# Patient Record
Sex: Male | Born: 1960
Health system: Southern US, Community
[De-identification: ages and names within clinical notes are randomized; demographics above are authoritative.]

## PROBLEM LIST (undated history)

## (undated) DIAGNOSIS — I10 Essential (primary) hypertension: Secondary | ICD-10-CM

## (undated) DIAGNOSIS — N183 Chronic kidney disease, stage 3 unspecified: Secondary | ICD-10-CM

## (undated) DIAGNOSIS — Z8711 Personal history of peptic ulcer disease: Secondary | ICD-10-CM

## (undated) DIAGNOSIS — I2510 Atherosclerotic heart disease of native coronary artery without angina pectoris: Secondary | ICD-10-CM

## (undated) DIAGNOSIS — N289 Disorder of kidney and ureter, unspecified: Secondary | ICD-10-CM

## (undated) DIAGNOSIS — T8859XA Other complications of anesthesia, initial encounter: Secondary | ICD-10-CM

## (undated) DIAGNOSIS — C801 Malignant (primary) neoplasm, unspecified: Secondary | ICD-10-CM

## (undated) DIAGNOSIS — I519 Heart disease, unspecified: Secondary | ICD-10-CM

## (undated) DIAGNOSIS — Z9861 Coronary angioplasty status: Secondary | ICD-10-CM

## (undated) DIAGNOSIS — M25552 Pain in left hip: Secondary | ICD-10-CM

## (undated) DIAGNOSIS — J45909 Unspecified asthma, uncomplicated: Secondary | ICD-10-CM

## (undated) DIAGNOSIS — C61 Malignant neoplasm of prostate: Secondary | ICD-10-CM

## (undated) DIAGNOSIS — M1990 Unspecified osteoarthritis, unspecified site: Secondary | ICD-10-CM

## (undated) DIAGNOSIS — F329 Major depressive disorder, single episode, unspecified: Secondary | ICD-10-CM

## (undated) DIAGNOSIS — M549 Dorsalgia, unspecified: Secondary | ICD-10-CM

## (undated) DIAGNOSIS — F419 Anxiety disorder, unspecified: Secondary | ICD-10-CM

## (undated) DIAGNOSIS — E7800 Pure hypercholesterolemia, unspecified: Secondary | ICD-10-CM

## (undated) DIAGNOSIS — F32A Depression, unspecified: Secondary | ICD-10-CM

## (undated) DIAGNOSIS — G8929 Other chronic pain: Secondary | ICD-10-CM

## (undated) DIAGNOSIS — M7710 Lateral epicondylitis, unspecified elbow: Secondary | ICD-10-CM

## (undated) DIAGNOSIS — R809 Proteinuria, unspecified: Secondary | ICD-10-CM

## (undated) DIAGNOSIS — K219 Gastro-esophageal reflux disease without esophagitis: Secondary | ICD-10-CM

## (undated) DIAGNOSIS — Z8719 Personal history of other diseases of the digestive system: Secondary | ICD-10-CM

## (undated) DIAGNOSIS — M109 Gout, unspecified: Secondary | ICD-10-CM

## (undated) HISTORY — DX: Gastro-esophageal reflux disease without esophagitis: K21.9

## (undated) HISTORY — DX: Chronic kidney disease, stage 3 unspecified: N18.30

## (undated) HISTORY — DX: Depression, unspecified: F32.A

## (undated) HISTORY — DX: Malignant neoplasm of prostate: C61

## (undated) HISTORY — DX: Proteinuria, unspecified: R80.9

## (undated) HISTORY — DX: Unspecified asthma, uncomplicated: J45.909

## (undated) HISTORY — DX: Heart disease, unspecified: I51.9

## (undated) HISTORY — DX: Coronary angioplasty status: Z98.61

## (undated) HISTORY — DX: Anxiety disorder, unspecified: F41.9

## (undated) HISTORY — DX: Essential (primary) hypertension: I10

## (undated) HISTORY — DX: Chronic kidney disease, stage 3 (moderate): N18.3

## (undated) HISTORY — PX: OTHER SURGICAL HISTORY: SHX169

## (undated) HISTORY — DX: Personal history of peptic ulcer disease: Z87.11

## (undated) HISTORY — DX: Unspecified osteoarthritis, unspecified site: M19.90

## (undated) HISTORY — DX: Pure hypercholesterolemia, unspecified: E78.00

## (undated) HISTORY — DX: Disorder of kidney and ureter, unspecified: N28.9

## (undated) HISTORY — DX: Gout, unspecified: M10.9

## (undated) HISTORY — PX: HIP SURGERY: SHX245

## (undated) HISTORY — PX: HERNIA REPAIR: SHX51

---

## 1997-10-25 HISTORY — PX: HERNIA REPAIR: SHX51

## 2002-03-29 ENCOUNTER — Encounter: Payer: Self-pay | Admitting: Internal Medicine

## 2002-03-29 ENCOUNTER — Ambulatory Visit (HOSPITAL_COMMUNITY): Admission: RE | Admit: 2002-03-29 | Discharge: 2002-03-29 | Payer: Self-pay | Admitting: Internal Medicine

## 2002-04-16 ENCOUNTER — Ambulatory Visit (HOSPITAL_COMMUNITY): Admission: RE | Admit: 2002-04-16 | Discharge: 2002-04-16 | Payer: Self-pay | Admitting: Urology

## 2002-04-16 ENCOUNTER — Encounter: Payer: Self-pay | Admitting: Urology

## 2002-05-08 ENCOUNTER — Inpatient Hospital Stay (HOSPITAL_COMMUNITY): Admission: RE | Admit: 2002-05-08 | Discharge: 2002-05-12 | Payer: Self-pay | Admitting: Urology

## 2002-05-08 ENCOUNTER — Encounter (INDEPENDENT_AMBULATORY_CARE_PROVIDER_SITE_OTHER): Payer: Self-pay | Admitting: Specialist

## 2002-07-25 ENCOUNTER — Encounter: Admission: RE | Admit: 2002-07-25 | Discharge: 2002-07-25 | Payer: Self-pay | Admitting: Urology

## 2002-07-25 ENCOUNTER — Encounter: Payer: Self-pay | Admitting: Urology

## 2002-10-25 DIAGNOSIS — I219 Acute myocardial infarction, unspecified: Secondary | ICD-10-CM

## 2002-10-25 DIAGNOSIS — Z9861 Coronary angioplasty status: Secondary | ICD-10-CM

## 2002-10-25 HISTORY — DX: Acute myocardial infarction, unspecified: I21.9

## 2002-10-25 HISTORY — PX: OTHER SURGICAL HISTORY: SHX169

## 2002-10-25 HISTORY — DX: Coronary angioplasty status: Z98.61

## 2003-01-19 ENCOUNTER — Emergency Department (HOSPITAL_COMMUNITY): Admission: EM | Admit: 2003-01-19 | Discharge: 2003-01-20 | Payer: Self-pay | Admitting: *Deleted

## 2003-01-22 ENCOUNTER — Ambulatory Visit (HOSPITAL_COMMUNITY): Admission: RE | Admit: 2003-01-22 | Discharge: 2003-01-22 | Payer: Self-pay | Admitting: *Deleted

## 2003-01-22 ENCOUNTER — Encounter: Payer: Self-pay | Admitting: *Deleted

## 2003-04-24 ENCOUNTER — Ambulatory Visit (HOSPITAL_COMMUNITY): Admission: RE | Admit: 2003-04-24 | Discharge: 2003-04-24 | Payer: Self-pay | Admitting: Internal Medicine

## 2003-04-24 ENCOUNTER — Encounter: Payer: Self-pay | Admitting: Internal Medicine

## 2003-05-01 ENCOUNTER — Ambulatory Visit (HOSPITAL_COMMUNITY): Admission: RE | Admit: 2003-05-01 | Discharge: 2003-05-01 | Payer: Self-pay | Admitting: Internal Medicine

## 2003-05-01 ENCOUNTER — Encounter: Payer: Self-pay | Admitting: Internal Medicine

## 2003-05-22 ENCOUNTER — Encounter: Payer: Self-pay | Admitting: Urology

## 2003-05-22 ENCOUNTER — Encounter (HOSPITAL_COMMUNITY): Admission: RE | Admit: 2003-05-22 | Discharge: 2003-06-21 | Payer: Self-pay | Admitting: Urology

## 2003-05-29 ENCOUNTER — Encounter: Payer: Self-pay | Admitting: Orthopedic Surgery

## 2003-05-29 ENCOUNTER — Ambulatory Visit (HOSPITAL_COMMUNITY): Admission: RE | Admit: 2003-05-29 | Discharge: 2003-05-29 | Payer: Self-pay | Admitting: Orthopedic Surgery

## 2003-08-19 ENCOUNTER — Ambulatory Visit (HOSPITAL_COMMUNITY): Admission: RE | Admit: 2003-08-19 | Discharge: 2003-08-19 | Payer: Self-pay | Admitting: Internal Medicine

## 2003-08-19 ENCOUNTER — Encounter: Payer: Self-pay | Admitting: Internal Medicine

## 2003-08-21 ENCOUNTER — Ambulatory Visit (HOSPITAL_COMMUNITY): Admission: RE | Admit: 2003-08-21 | Discharge: 2003-08-21 | Payer: Self-pay | Admitting: Internal Medicine

## 2003-08-22 ENCOUNTER — Ambulatory Visit (HOSPITAL_COMMUNITY): Admission: RE | Admit: 2003-08-22 | Discharge: 2003-08-22 | Payer: Self-pay | Admitting: Orthopedic Surgery

## 2004-06-08 ENCOUNTER — Ambulatory Visit (HOSPITAL_COMMUNITY): Admission: RE | Admit: 2004-06-08 | Discharge: 2004-06-08 | Payer: Self-pay | Admitting: Internal Medicine

## 2004-09-15 ENCOUNTER — Ambulatory Visit (HOSPITAL_COMMUNITY): Admission: RE | Admit: 2004-09-15 | Discharge: 2004-09-15 | Payer: Self-pay | Admitting: Internal Medicine

## 2004-10-14 ENCOUNTER — Ambulatory Visit: Payer: Self-pay | Admitting: Orthopedic Surgery

## 2004-10-21 ENCOUNTER — Ambulatory Visit (HOSPITAL_COMMUNITY): Admission: RE | Admit: 2004-10-21 | Discharge: 2004-10-21 | Payer: Self-pay | Admitting: Orthopedic Surgery

## 2004-10-28 ENCOUNTER — Ambulatory Visit: Payer: Self-pay | Admitting: Orthopedic Surgery

## 2005-06-07 ENCOUNTER — Ambulatory Visit: Payer: Self-pay | Admitting: Orthopedic Surgery

## 2005-08-12 ENCOUNTER — Ambulatory Visit (HOSPITAL_COMMUNITY): Admission: RE | Admit: 2005-08-12 | Discharge: 2005-08-12 | Payer: Self-pay | Admitting: Internal Medicine

## 2005-10-12 ENCOUNTER — Inpatient Hospital Stay (HOSPITAL_COMMUNITY): Admission: AD | Admit: 2005-10-12 | Discharge: 2005-10-13 | Payer: Self-pay | Admitting: Cardiology

## 2005-10-22 ENCOUNTER — Ambulatory Visit (HOSPITAL_COMMUNITY): Admission: RE | Admit: 2005-10-22 | Discharge: 2005-10-22 | Payer: Self-pay | Admitting: Orthopedic Surgery

## 2005-11-03 ENCOUNTER — Ambulatory Visit: Payer: Self-pay | Admitting: Orthopedic Surgery

## 2006-04-26 ENCOUNTER — Emergency Department (HOSPITAL_COMMUNITY): Admission: EM | Admit: 2006-04-26 | Discharge: 2006-04-26 | Payer: Self-pay | Admitting: Emergency Medicine

## 2006-04-28 ENCOUNTER — Inpatient Hospital Stay (HOSPITAL_COMMUNITY): Admission: AD | Admit: 2006-04-28 | Discharge: 2006-04-30 | Payer: Self-pay | Admitting: Internal Medicine

## 2006-07-11 ENCOUNTER — Ambulatory Visit: Payer: Self-pay | Admitting: Orthopedic Surgery

## 2006-08-01 ENCOUNTER — Emergency Department (HOSPITAL_COMMUNITY): Admission: EM | Admit: 2006-08-01 | Discharge: 2006-08-02 | Payer: Self-pay | Admitting: Emergency Medicine

## 2006-09-12 ENCOUNTER — Ambulatory Visit: Payer: Self-pay | Admitting: Orthopedic Surgery

## 2006-09-14 ENCOUNTER — Ambulatory Visit (HOSPITAL_COMMUNITY): Admission: RE | Admit: 2006-09-14 | Discharge: 2006-09-14 | Payer: Self-pay | Admitting: Orthopedic Surgery

## 2006-09-21 ENCOUNTER — Ambulatory Visit: Payer: Self-pay | Admitting: Orthopedic Surgery

## 2006-09-29 ENCOUNTER — Ambulatory Visit (HOSPITAL_COMMUNITY): Admission: RE | Admit: 2006-09-29 | Discharge: 2006-09-29 | Payer: Self-pay | Admitting: Orthopedic Surgery

## 2007-01-12 ENCOUNTER — Ambulatory Visit: Payer: Self-pay | Admitting: Orthopedic Surgery

## 2007-07-17 ENCOUNTER — Ambulatory Visit: Payer: Self-pay | Admitting: Orthopedic Surgery

## 2007-07-19 ENCOUNTER — Ambulatory Visit (HOSPITAL_COMMUNITY): Admission: RE | Admit: 2007-07-19 | Discharge: 2007-07-19 | Payer: Self-pay | Admitting: Orthopedic Surgery

## 2007-07-25 ENCOUNTER — Ambulatory Visit: Payer: Self-pay | Admitting: Orthopedic Surgery

## 2008-01-15 ENCOUNTER — Ambulatory Visit (HOSPITAL_COMMUNITY): Admission: RE | Admit: 2008-01-15 | Discharge: 2008-01-15 | Payer: Self-pay | Admitting: *Deleted

## 2008-01-16 ENCOUNTER — Inpatient Hospital Stay (HOSPITAL_COMMUNITY): Admission: AD | Admit: 2008-01-16 | Discharge: 2008-01-19 | Payer: Self-pay | Admitting: Cardiovascular Disease

## 2008-01-21 ENCOUNTER — Other Ambulatory Visit: Payer: Self-pay | Admitting: Emergency Medicine

## 2008-01-21 ENCOUNTER — Inpatient Hospital Stay (HOSPITAL_COMMUNITY): Admission: EM | Admit: 2008-01-21 | Discharge: 2008-01-24 | Payer: Self-pay | Admitting: Cardiovascular Disease

## 2008-11-11 ENCOUNTER — Ambulatory Visit: Payer: Self-pay | Admitting: Orthopedic Surgery

## 2008-11-11 DIAGNOSIS — M8700 Idiopathic aseptic necrosis of unspecified bone: Secondary | ICD-10-CM | POA: Insufficient documentation

## 2008-11-11 DIAGNOSIS — M25559 Pain in unspecified hip: Secondary | ICD-10-CM

## 2009-02-24 ENCOUNTER — Emergency Department (HOSPITAL_COMMUNITY): Admission: EM | Admit: 2009-02-24 | Discharge: 2009-02-24 | Payer: Self-pay | Admitting: Emergency Medicine

## 2009-04-18 ENCOUNTER — Encounter: Payer: Self-pay | Admitting: Orthopedic Surgery

## 2009-06-30 ENCOUNTER — Inpatient Hospital Stay (HOSPITAL_COMMUNITY): Admission: EM | Admit: 2009-06-30 | Discharge: 2009-07-02 | Payer: Self-pay | Admitting: Cardiology

## 2010-11-14 ENCOUNTER — Encounter: Payer: Self-pay | Admitting: Orthopedic Surgery

## 2011-01-29 LAB — LIPID PANEL
Cholesterol: 126 mg/dL (ref 0–200)
HDL: 42 mg/dL (ref >39)
LDL Cholesterol: 62 mg/dL (ref 0–99)
Triglycerides: 110 mg/dL (ref <150)

## 2011-01-29 LAB — BASIC METABOLIC PANEL
BUN: 14 mg/dL (ref 6–23)
Calcium: 9.6 mg/dL (ref 8.4–10.5)
Chloride: 102 mEq/L (ref 96–112)
Chloride: 106 mEq/L (ref 96–112)
GFR calc Af Amer: 54 mL/min — ABNORMAL LOW (ref >60)
GFR calc non Af Amer: 44 mL/min — ABNORMAL LOW (ref >60)
GFR calc non Af Amer: 45 mL/min — ABNORMAL LOW (ref >60)
Glucose, Bld: 101 mg/dL — ABNORMAL HIGH (ref 70–99)
Glucose, Bld: 96 mg/dL (ref 70–99)
Potassium: 5 mEq/L (ref 3.5–5.1)
Potassium: 4.2 mEq/L (ref 3.5–5.1)
Sodium: 141 mEq/L (ref 135–145)

## 2011-01-29 LAB — URINE MICROSCOPIC-ADD ON

## 2011-01-29 LAB — D-DIMER, QUANTITATIVE: D-Dimer, Quant: 0.22 ug/mL-FEU (ref 0.00–0.48)

## 2011-01-29 LAB — CBC
HCT: 44.2 % (ref 39.0–52.0)
HCT: 41 % (ref 39.0–52.0)
HCT: 42.4 % (ref 39.0–52.0)
Hemoglobin: 15.5 g/dL (ref 13.0–17.0)
Hemoglobin: 15.1 g/dL (ref 13.0–17.0)
MCV: 91.3 fL (ref 78.0–100.0)
MCV: 93.3 fL (ref 78.0–100.0)
Platelets: 181 10*3/uL (ref 150–400)
Platelets: 179 10*3/uL (ref 150–400)
RBC: 4.39 MIL/uL (ref 4.22–5.81)
RBC: 4.6 MIL/uL (ref 4.22–5.81)
WBC: 5.3 10*3/uL (ref 4.0–10.5)
WBC: 6.3 10*3/uL (ref 4.0–10.5)
WBC: 7 10*3/uL (ref 4.0–10.5)

## 2011-01-29 LAB — DIFFERENTIAL
Eosinophils Absolute: 0.1 10*3/uL (ref 0.0–0.7)
Eosinophils Relative: 1 % (ref 0–5)
Lymphs Abs: 1 10*3/uL (ref 0.7–4.0)
Monocytes Absolute: 0.5 10*3/uL (ref 0.1–1.0)

## 2011-01-29 LAB — HEPATIC FUNCTION PANEL
ALT: 25 U/L (ref 0–53)
AST: 23 U/L (ref 0–37)
Alkaline Phosphatase: 61 U/L (ref 39–117)
Bilirubin, Direct: <0.1 mg/dL (ref 0.0–0.3)
Total Protein: 6.3 g/dL (ref 6.0–8.3)

## 2011-01-29 LAB — POCT CARDIAC MARKERS
CKMB, poc: 1 ng/mL (ref 1.0–8.0)
Myoglobin, poc: 61.2 ng/mL (ref 12–200)
Troponin i, poc: <0.05 ng/mL (ref 0.00–0.09)

## 2011-01-29 LAB — URINALYSIS, ROUTINE W REFLEX MICROSCOPIC
Leukocytes, UA: NEGATIVE
Nitrite: NEGATIVE
Specific Gravity, Urine: 1.009 (ref 1.005–1.030)
Urobilinogen, UA: 0.2 mg/dL (ref 0.0–1.0)
pH: 6.5 (ref 5.0–8.0)

## 2011-01-29 LAB — APTT: aPTT: 32 seconds (ref 24–37)

## 2011-01-29 LAB — HEPARIN LEVEL (UNFRACTIONATED)
Heparin Unfractionated: 0.9 IU/mL — ABNORMAL HIGH (ref 0.30–0.70)
Heparin Unfractionated: 1.27 IU/mL — ABNORMAL HIGH (ref 0.30–0.70)

## 2011-01-29 LAB — TROPONIN I: Troponin I: 0.01 ng/mL (ref 0.00–0.06)

## 2011-01-29 LAB — CARDIAC PANEL(CRET KIN+CKTOT+MB+TROPI)
CK, MB: 1.1 ng/mL (ref 0.3–4.0)
CK, MB: 1.1 ng/mL (ref 0.3–4.0)
Total CK: 65 U/L (ref 7–232)

## 2011-01-29 LAB — TSH: TSH: 2.147 u[IU]/mL (ref 0.350–4.500)

## 2011-01-29 LAB — CK TOTAL AND CKMB (NOT AT ARMC): Relative Index: INVALID (ref 0.0–2.5)

## 2011-01-29 LAB — LIPASE, BLOOD: Lipase: 23 U/L (ref 11–59)

## 2011-01-29 LAB — BRAIN NATRIURETIC PEPTIDE: Pro B Natriuretic peptide (BNP): 60 pg/mL (ref 0.0–100.0)

## 2011-03-09 NOTE — Discharge Summary (Signed)
NAMEMarland Kitchen  Buck, Brandon NO.:  [id number]   MEDICAL RECORD NO.:  [id number]          PATIENT TYPE:  INP   LOCATION:  2035                         FACILITY:  [facility]   PHYSICIAN:  Bryson Dames, M.D.DATE OF BIRTH:  1960-12-27   DATE OF ADMISSION:  01/21/2008  DATE OF DISCHARGE:  01/24/2008                               DISCHARGE SUMMARY   HISTORY OF PRESENT ILLNESS:  [name] is a 50 year old white male  patient of Dr. __________ Mathis Bud who had just been discharged from the  hospital on January 18, 2008 after undergoing Cypher stenting to his  circumflex and distal RCA.  He had a slight enzyme increase after the  catheter at 1.28.  He had called over the weekend complaining of chest  discomfort.  He was ordered Imdur.  The Imdur did not seem to help.  He  did take some sublingual nitroglycerin with some relief, but not  complete.  As he came to the emergency room, he was seen by Bari Mantis  and it was decided to admit him for further evaluation.  His enzymes had  decreased.  His D-dimer was 0.30.  He stated that the angina was similar  to his pre-stent pain, but not as severe.  He stated it was not his  indigestion pain.  He has had a history of a nephrectomy in the past,  but the creatinine ranges from 1.7 to 1.9.  He was seen by the Dr. Claiborne Billings  on the morning of January 22, 2008 and it was noted that he had some EKG  changes with T-wave inversions in his inferior leads.  Thus it was  decided that he should undergo cardiac catheterization.  He was given  fluids on January 22, 2008 and on January 23, 2008 he underwent cardiac  catheterization by Dr. Myrtice Lauth.  He was found to have patent  stent and only 40% circumflex.  His INR that morning would have been  1.78.  It was decided to give him fluids overnight again and Dr. Melvern Banker  attempted a [employer]; however, was unsatisfactory.  So pressure was  held.  On the morning of January 24, 2008, the patient refused to  have  anymore labs done.  He had no more chest pain on that morning.  He did  have chest pain the previous day on January 22, 2008.  He was ready to go  home.  His blood pressure was 130/72, heart rate was 83, respirations  were 20, and temperature 97.2.   DISCHARGE DIAGNOSES:  1. Chest pain, angina versus musculoskeletal versus gastrointestinal.  2. Coronary artery disease, status post Cypher stenting to his      circumflex and right coronary artery.  3. Recurrent chest pain similar to his previous angina with      hospitalization and recath without any further obstructive disease      noted in stents were patent.  4. Chronic renal insufficiency.  5. History of nephrectomy secondary to colon carcinoma.  6. Hypertension.  7. Hyperlipidemia.   DISCHARGE MEDICATIONS:  1. Pepcid 20 mg twice a day.  2.  Plavix 75 mg a day.  3. Aspirin 81 mg a day.  He should take 2.  4. Toprol XL 75 mg a day.  5. Crestor 20 mg at bedtime.  6. Diltiazem ER 180 mg a day.  7. Lyrica 50 mg twice a day.  8. Nitroglycerin sublingual as needed.  9. Imdur 30 mg once a day.  10.He can take simethicone tablet for gas, if he needs them.      Cyndia Bent, N.P.    ______________________________  Bryson Dames, M.D.    BB/MEDQ  D:  01/24/2008  T:  01/24/2008  Job:  [id number]

## 2011-03-09 NOTE — Cardiovascular Report (Signed)
NAMELUNSFORD, BENNER NO.:  [id number]   MEDICAL RECORD NO.:  [id number]          PATIENT TYPE:  INP   LOCATION:  2925                         FACILITY:  [facility]   PHYSICIAN:  Octavia Heir, MD  DATE OF BIRTH:  1961-08-23   DATE OF PROCEDURE:  01/17/2008  DATE OF DISCHARGE:                            CARDIAC CATHETERIZATION   PROCEDURES:  1. Left heart catheterization.  2. Coronary angiography.  3. Left circumflex - mid.      a.     Percutaneous transluminal angioplasty.      b.     Placement of intracoronary stent.  4. RCA - distal.      a.     Percutaneous transluminal  angioplasty.      b.     Placement of coronary stent.   ATTEDING:  Octavia Heir, MD   COMPLICATIONS:  None.   INDICATIONS:  Mr. Catano is a 50 year old male patient of Dr. Rockey Situ, Dr. Kerin Perna and Dr. Lowanda Foster with a history of mild CAD  by cath in December 2006, positive CAD, history of chronic renal  insufficiency (serum creatinine of 1.5 and 1.8) history of renal cell  lung carcinoma with right nephrectomy in 2003 who recently saw Dr.  Rollene Fare with crescendo angina.  This was associated with shortness of  breath, nausea and diaphoresis.  He was admitted on January 16, 2008, for  IV hydration and is now brought for cardiac catheterization to evaluate  his coronary anatomy.   DESCRIPTION OF OPERATION:  After informed consent, the patient was  brought to the cardiac cath lab.  Right groin shaved, prepped and draped  in sterile fashion.  Monitors were established.  Using modified  Seldinger, a #6-French arterial sheath inserted into the right femoral  artery.  A 6-French diagnostic catheter was used to perform diagnostic  angiography.   Left main was a large vessel with no disease.   The LAD is a large vessel courses to the apex with two diagonal  branches.  The LAD is calcified in its mid segment with mild 40%  midvessel narrowing.  The first and second diagonals  have no significant  disease.   Left circumflex is a medium-size vessel coursing in the AV groove and  two obtuse marginal branches.  The AV circumflex noted to have a 95%  midvessel lesion between the first and second OM.   First OM is a small vessel with no disease.   Second OM is a medium-size vessel which bifurcates in the segment with  sequential 30% lesion in its proximal midsegment.   The right coronary artery is a large vessel that is dominant that gives  rise to a PDA as well as posterolateral branch.  A 50% proximal  narrowing as well as 30% midvessel narrowing.  In the distal RCA, the  sequential is 70% and 95% stenosis which is a hazy.  The PDA and  posterolateral branches have no disease.   No LV gram performed secondary to chronic renal insufficiency.   HEMODYNAMIC RESULTS:  Systemic arterial pressure 130/78, LV system  pressure  139/13, LVEDP of 24.   INTERVENTIONAL PROCEDURE:  Left circumflex - mid:  Following diagnostic  angiography a #6-French XB 3.0 guiding catheter with side holes  __________ engaged in the left coronary ostium.  A 0.014 ATW marker wire  was advanced down the guiding catheter and positioned distal second OM  without difficulty.  We then attempted to pass a 275 x 18 mm Cypher  stent as a primary stent.  However, this was unable to pass beyond the  mid circumflex stenotic lesion.  This stent was then removed and a fire  Star 2.5 x 15-mm balloon was then positioned across the stenotic lesion  in the circumflex.  This stent was deployed to 8 atmospheres for a total  of 20 seconds.  Follow-up angiogram revealed good luminal gain.  This  balloon was then removed and the Cypher 2.75 x 18 mm stent was then  positioned across the stenotic lesion.  This stent was then deployed to  10 atmospheres for a total of 26 seconds.  Follow-up angiogram revealed  excellent luminal gain with no evidence of dissection or thrombus.  Stent balloon was removed, and then  a dura Star 2.75 x 15-mm balloon was  then positioned in the circumflex stent.  Two overlapping inflations to  maximum 18 atmospheres performed for total of 39 seconds.  Follow-up  angiogram revealed good luminal gain.  At this point we had used  approximately 125 cc of contrast, and we opted to pursue the distal RCA  lesion given the serum creatinine 1.5.  This guide was then exchanged  for a JR-4 guiding catheter with side holes which was then coaxially  engaged in the right coronary ostium.  The same 0.014 HW marker wire was  advanced down the guiding catheter, positioned to the distal PDA without  difficulty.  We then used the same fire Star 2.5 x 15-mm balloon into  the distal RCA with two inflations to maximum of 14 atmospheres for  total of 36 seconds.  Follow-up angiogram revealed TIMI-I flow.  The  patient a complaint of chest pain and became bradycardiac.  There was  noted to be some distal embolization of the debris into the PDA.  Atropine 1 ml was given intravenously and the blood pressure and heart  rate both returned to normal.  Next, a Cypher 2.75 x 23-mm stent was  then positioned across the stenotic lesion.  The stents was then  employed to 12 atmospheres for total of 11 seconds.  A second inflation  to 16 atmospheres performed for total of 18 seconds.  Follow-up  angiogram revealed good luminal gain with restoration of TIMI III flow.  There did not appear to be any loss of side branch or distal  embolization that was visual.  This stent balloon was removed, and a  dura Star 3.0 x 15-mm balloon was then positioned within the stent.  Two  overlapping inflations __________ 14 atmospheres performed for total of  28-second.  Follow-up angiogram revealed good luminal gain with no  evidence of dissection or thrombus.  IV Angiomax used throughout the  case.   Final orthogonal angiograms reveal less than 10% residual stenosis in  the left circumflex stenotic lesion as well as  less than 10% residual  stenosis in the distal RCA stenotic lesion.  At this point, I elected to  conclude the procedure.  All balloons wires and cath removed.  Hemostatic sheath was sewn in place.  The patient transferred back to  the ward  in stable condition.   CONCLUSION:  1. Successful percutaneous transluminal angioplasty with placement of      a Cypher 2.75 x 18-mm stent in the mid left circumflex stenotic      lesion.  2. Successful percutaneous transluminal balloon angioplasty with      placement of a 2.75 x 23-mm Cypher of the distal RCA, ultimately      postdilated to 3 mm.  3. Adjuvant use of Angiomax infusion.      Octavia Heir, MD  Electronically Signed     RHM/MEDQ  D:  01/17/2008  T:  01/17/2008  Job:  [id number]   cc:   Delfino Lovett A. Rollene Fare, M.D.  Sherrilee Gilles. Gerarda Fraction, MD  Alison Murray, M.D.

## 2011-03-09 NOTE — Cardiovascular Report (Signed)
[name], [name].:  [id number]   MEDICAL RECORD Brandon.:  [id number]          PATIENT TYPE:  INP   LOCATION:  2035                         FACILITY:  [facility]   PHYSICIAN:  Bryson Dames, M.D.DATE OF BIRTH:  28-Dec-1960   DATE OF PROCEDURE:  01/22/2008  DATE OF DISCHARGE:                            CARDIAC CATHETERIZATION   PROCEDURES PERFORMED:  1. Selective coronary angiography by Judkins technique.  2. The patient did not receive retrograde left heart catheterization      or left ventricular angiography.  His creatinine is 1.7.  he      underwent catheterization several days ago.   ANGIOGRAPHIC RESULTS:  The patient had a normal-appearing left main.   The left anterior descending coronary artery coursed the cardiac apex.  It did have some calcification in the midportion of the vessel beyond  septal perforator branch #2.  It was tubular and very mild.  Brandon high-  grade calcification seen.  Brandon obstructive lesions seen in the LAD which  diagonal branches.  Circumflex coronary artery consisted of two atrial  circumflex branches which were long.  There was a smaller first obtuse  marginal branch that arose very high near the origin of the circumflex.  There was a radio-opaque stent that occupied the proximal circumflex and  it its proximal end and distal end were just on either side of the two  previously-mentioned atrial circumflex branches.  The stent was widely  patent.  I saw Brandon inflow stenosis nor an outflow stenosis into or out of  the stent.  The distal vessel was normal.   The right coronary artery was normal.  The right coronary artery  contains some luminal irregularities throughout its proximal and  midportions.  There was a widely patent radio-opaque stent in the distal  RCA just before the bifurcation of the distal RCA.   Brandon LV angiogram was performed.   IMPRESSION:  1. Dye limited cardiac catheterization due to the patient's creatinine  of 1.78.  2. Patent stents into the distal RCA and in the proximal circumflex.      Brandon other lesions were noteworthy or capable of causing chest pain      in my opinion.   The final thought would be that this patient may be having  gastrointestinal symptoms related to his Plavix.  He is going to be on  Pepcid as proton pump inhibitors are currently not felt to be advisable  with patients who are on Plavix.           ______________________________  Bryson Dames, M.D.     WHG/MEDQ  D:  01/23/2008  T:  01/23/2008  Job:  [id number]   cc:   Leslye Peer, MD  Minna Merritts, MD  [facility]

## 2011-03-09 NOTE — Discharge Summary (Signed)
NAMEMarland Kitchen  STEEN, KICK NO.:  [id number]   MEDICAL RECORD NO.:  [id number]          PATIENT TYPE:  INP   LOCATION:  2925                         FACILITY:  [facility]   PHYSICIAN:  Bryson Dames, M.D.DATE OF BIRTH:  1961-10-02   DATE OF ADMISSION:  01/16/2008  DATE OF DISCHARGE:  01/19/2008                               DISCHARGE SUMMARY   HISTORY OF PRESENT ILLNESS:  [name] is a 50 year old white  married male patient who is seen by Dr. Terance Ice at the  regional office on January 15, 2008.  He was having chest pain.  He has  been seen by Dr. Mathis Bud in the past.  He has a history of known minor  coronary disease.  He has also had a history of some elevated  creatinine.  Dr. Rollene Fare thought he should be admitted and undergo  cardiac catheterization, the patient declined.  He thought he also would  need hydration preoperatively.  In the office, his Toprol was increased  to 75, his Cardizem was decreased.  He was placed on Prilosec, and he  had aspirin and gave him Plavix 300 mg, and it was planned that he would  come into the hospital on January 17, 2008, for elective cardiac  catheterization.  This was performed by Dr. Tami Ribas on January 17, 2008.  He had a high-grade stenosis in his RCA and circumflex.  He underwent  Cypher stenting with a 2.75 x 18 to his left circumflex mid lesion and  to his RCA.  He had a Cypher 2.75 x 23 to the distal lesion.  He was  placed on aspirin and Plavix.   Postprocedure, his enzymes bumped mildly.  He was seen by [facility] Re[facility].  It was decided to keep him another day, and on January 19, 2008, he was  seen by Dr. Melvern Banker.  He thought, after he walked in the hall and after  he ate his lunch, if he was feeling fine, he could go home.  Thus, at  this point in time at 12:30 p.m., he has had no further chest pain.   DISCHARGE MEDICATIONS:  Pepcid 20 mg twice per day; Plavix 75 mg once  per day, he is not to stop it; aspirin  81 mg 2 per day; metoprolol  succinate 75 mg daily; Crestor 20 mg at bedtime; diltiazem ER 180 mg a  day; Benicar HCT 40/25 a day; Lyrica 50 mg twice per day; nitroglycerin  1/150 under the tongue when needed for chest pain.   He will follow up with Dr. Odette Fraction on February 05, 2008, at 10:45  hours.  He will call our office if he has any further problems.  He  should not do any strenuous activity, lifting, pushing, pulling, or  extended walking, or exercise for 1 week, and he will not drive for 2  days.   LABORATORY DATA:  His sodium was 139, potassium 3.8, BUN 15, creatinine  1.82, that was after cardiac catheterization.  Hemoglobin 12.4,  hematocrit 35.6, WBC 9.3, and platelets were 153.  On January 18, 2008,  his BUN  was 11 and creatinine was 1.67.  On the admission, his BUN was  12 and creatinine was 1.55.   His CK-MB:  1. 73/1.4, troponin of  0.26.  2. 102/7.5, troponin of  0.74.  3. 131/11.5, troponin of 1.43.  4. 123/8.2, troponin of 1.28.  We will tell him to hold his Benicar HCT until Monday, because of his  creatinine. His blood pressure at the time of discharge was 138/73.  He  was not getting his Benicar HCT, it was still on hold.  We will have him  check  BMET on Monday.   DISCHARGE DIAGNOSES:  1. Unstable angina.  2. Coronary artery disease status post cath with high-grade disease in      his right coronary artery and circumflex status post stenting with      Cypher  stents by Dr. Alla German on January 17, 2008.  3. No left ventriculogram done secondary to his elevated creatinine.  4. Chronic renal insufficiency.  He was given extra fluids and his      Benicar HCT is still on hold.  He can restart that on Monday, and      he will have a basic metabolic panel rechecked on Monday.  5. Hyperlipidemia.  6. Gastroesophageal reflux disease.  7. History of tobacco use.  8. Status post right nephrectomy secondary to renal cell carcinoma in      2003.      Cyndia Bent, N.P.    ______________________________  Bryson Dames, M.D.    BB/MEDQ  D:  01/19/2008  T:  01/20/2008  Job:  [id number]   cc:   Leslye Peer, MD  Scarlette Calico. Bosie Clos, MD  Eustace Quail. Chales Abrahams, MD

## 2011-03-12 NOTE — Op Note (Signed)
NAME:  LYNKIN, TORRENTE                         ACCOUNT NO.:  [id number]   MEDICAL RECORD NO.:  [id number]                   PATIENT TYPE:  AMB   LOCATION:  DAY                                  FACILITY:  [facility]   PHYSICIAN:  Carole Civil, M.D.           DATE OF BIRTH:  01/01/1961   DATE OF PROCEDURE:  08/22/2003  DATE OF DISCHARGE:  08/22/2003                                 OPERATIVE REPORT   Date of dictation:  08/25/2003 (redictation).   PATIENT TYPE:  Ambulatory.   FACILITY:  [facility]   INDICATIONS FOR PROCEDURE:  Mick is a 50 year old male who had a kidney  transplant; and during his premorbid stage of nephritis was placed on  steroids and developed lumbar spine and left hip pain in the postoperative  period.  After his nephrectomy he had left hip pain and his MRI showed  avascular necrosis of the left hip.  He also had degenerative disk disease  of his lumbar spine.  His avascular necrosis was stage 2 of the Ficat  classification system and involved diffuse involvement of his femoral head,  but no collapse. He presents for cord decompression.   PREOPERATIVE DIAGNOSIS:  Avascular necrosis left hip.   POSTOPERATIVE DIAGNOSIS:  Avascular necrosis left hip.   PROCEDURE:  Cord decompression and bone graft left hip.   SURGEON:  Carole Civil, M.D.   ANESTHETIC:  General.   FINDINGS:  Diffuse avascular necrosis of the left hip.   DETAILS OF PROCEDURE:  Nemiah Scallion was identified in the holding area  through questioning and through his identification  badge.  His medical  record and consent form indicated that he was to have a cord decompression  of the left hip.  He placed an X over this area and my initials were  placed over the X.  He was given a preoperative antibiotic and taken to  the operating room where he was given a general anesthetic.  He was placed  on the fracture table. His right leg was placed in a well-leg holder and  padded  appropriately.  The perineal post was also padded and his left leg  was placed in a traction device, but no traction was applied.   At this point the required time out was taken.  All present in the operating  room confirmed that this was Earnestine Mealing, and that he was to have left hip  cord decompression with a bone grafting.   Sterile prep and draped was performed.  An incision was made just inferior  to the greater trochanter and taken down to the bone where subperiosteal  dissection exposed the lateral femur.  A K-wire was placed in the center of  the femoral head on both AP and lateral x-rays which were taken with the C-  arm.   The triple reamer was passed over the guidewire under C-arm guidance, and  then the space  was packed with DBM putty and cancellous bone graft chips.  The wound was irrigated and closed in layered fashion with #0 and 2-0  Vicryl.  Sensorcaine 1/2% was injected and staples were used to close the  skin.  A sterile dressing was applied.  The patient was extubated, taken off  the fracture table, and taken to the recovery room in stable condition.   He is to follow up 1 day after surgery.  He is nonweight bearing on his left  lower extremity.      ___________________________________________                                            Carole Civil, M.D.   SEH/MEDQ  D:  08/25/2003  T:  08/25/2003  Job:  [id number]

## 2011-03-12 NOTE — Op Note (Signed)
[facility]  Patient:    Brandon Buck, Brandon Buck Visit Number: [id number] MRN: [id number]          Service Type: SUR Location: 3W [id number] Attending Physician:  Paul Dykes Dictated by:   Duane Lope. Parks Neptune, M.D. Proc. Date: 05/08/02 Admit Date:  05/08/2002                             Operative Report  PREOPERATIVE DIAGNOSIS:  Right renal mass.  POSTOPERATIVE DIAGNOSIS:  Right renal mass.  OPERATION PERFORMED:  Right radical nephrectomy.  SURGEON:  Duane Lope. Parks Neptune, M.D.  ASSISTANT:  Marshall Cork. Jeffie Pollock, M.D. and Dr. Dutch Gray.  ANESTHESIA:  General endotracheal.  ESTIMATED BLOOD LOSS:  150 cc.  TUBES:  16 French Foley.  COMPLICATIONS:  None.  INDICATIONS FOR PROCEDURE:  Brandon Buck is a very nice 50 year old white male who presented with weight loss and just generally feeling bad. CT scan of the abdomen and pelvis revealed a 4 x 5 cm heterogenous lesion associated with the anterior portion of the right kidney extending into the hilum. His metastatic workup was negative. After understanding the risks, benefits, and alternatives, he elected to proceed with a radical nephrectomy and was felt not to be a good candidate for partial nephrectomy due to his extension into the hilum and its overlying the vasculature.  DESCRIPTION OF PROCEDURE:  The patient was placed in supine position after proper general endotracheal anesthesia was placed left flank anterior with a bump. An axillary roll was placed and he was prepped and draped with Betadine in a sterile fashion. A left subcostal incision was made, sharp dissection was carried down through the subcutaneous tissue. The anterior rectus sheath, rectus muscle belly and the posterior rectus sheath along with the external oblique, internal oblique, and transverse abdominis muscles and fascia. The peritoneum was entered and explored. There were no obvious lesions noted. The mass could be palpable on  the anterior right kidney. The left kidney was palpably normal and no other intra-abdominal lesions were noted on exploratory laparotomy. The line of Toldt was sharply incised and the colon was reflected medially and Gerotas fascia was dissected both anterior and posterior to the kidney. The lower pole was then approached and resected extra gerotally. It was taken down, the ureter was clipped proximally and distally with large hemoclips and the gonadal vessels were left intact was was the gonadal vein. Dissection was carried to the hilum again dissecting anterior to the kidney. The mass could be palpable and a wide margin was obtained around the mass. The upper pole of the kidney was then taken down, the adrenal gland was left in situ and Gerotas fascia was taken in the split leaflet between the adrenal gland and the kidney again taken posteriorly. The hilum was then approached, the renal vein was identified, dissected proximally and distally. There were several branches which were tied with silk ligatures or clipped. Of interest, there were noted to be three large branches of the renal artery at the level of the junction of the right renal vein and the vena cava. Each were taken serially and ligated proximally with #1 silk ligatures, two proximally and one distally along with a safety clip with a hemlock clip and were cut. The posterior tissues were then taken down, lymphatic tissues down to the large renal vein. The renal vein was ligated proximally with two #1 silk ligatures and one distally and  cut. Thorough irrigation was performed. The upper pole tissue was taken down and a specimen was taken down intact. We felt that there was a good wide en bloc resection. Thorough irrigation was performed, good hemostasis noted to be present. The colon was placed in the right renal fossa. The wound was then closed in layers. The posterior rectus sheath and the transversalis abdominis were closed in  one layer with #1 PDS followed by a second layer of internal oblique aponeurosis and the outer layer with a running #1 PDS anterior rectus and external oblique. Thorough irrigation was performed, good hemostasis noted to be present. The skin was closed with skin staples. The patient was taken to the recovery room in stable condition. Dictated by:   Duane Lope. Parks Neptune, M.D. Attending Physician:  Paul Dykes DD:  05/08/02 TD:  05/11/02 Job: [id number]

## 2011-03-12 NOTE — Discharge Summary (Signed)
NAME:  Brandon Buck, Brandon Buck                      ACCOUNT NO.:  [id number]   MEDICAL RECORD NO.:  [id number]                   PATIENT TYPE:  INP   LOCATION:  [id number]                                 FACILITY:  [facility]   PHYSICIAN:  Duane Lope. Parks Neptune, M.D.           DATE OF BIRTH:  August 27, 1961   DATE OF ADMISSION:  05/08/2002  DATE OF DISCHARGE:  05/12/2002                                 DISCHARGE SUMMARY   OPERATIVE PROCEDURE:  Right radical nephrectomy on May 08, 2002.   HISTORY OF PRESENT ILLNESS:  The patient is a very nice 50 year old white  male who has had a progressive 15 pound weight loss over two months.  He has  had some shortness of breath, also emphysema and a heavy smoker, and CT scan  of the abdomen and pelvis which revealed a 4.5 cm heterogeneous lesion with  enhancement associated with anterior right kidney extending to the hilum  suspicious for kidney cancer.  After understanding risks, benefits, and  alternatives and having a metastatic work-up he has elected to proceed with  radical nephrectomy.   PAST MEDICAL HISTORY:  Please see history and physical examination for  details.   SOCIAL HISTORY:  Please see history and physical examination for details.   FAMILY HISTORY:  Please see history and physical examination for details.   REVIEW OF SYMPTOMS:  Please see history and physical examination for  details.   PHYSICAL EXAMINATION:  VITAL SIGNS:  He is afebrile.  Vital signs stable.  GENERAL:  Well-nourished, well-developed, well groomed.  HEENT:  Normal.  NECK:  Without mass or thyromegaly.  CHEST:  Clear anterior and posterior without rales or rhonchi.  ABDOMEN:  Soft, nontender without mass, organomegaly.  EXTREMITIES:  Normal.  NEUROLOGIC:  Intact.   HOSPITAL COURSE:  The patient was admitted after undergoing proper  preoperative evaluation.  Came to surgery on May 08, 2002 and underwent a  right radical nephrectomy uneventfully.  Immediately  postoperatively he was  doing very well and comfortable.  Incisions were dry.  By May 09, 2002  chest was clear.  Abdomen was soft.  CBC and BMET were essentially within  normal limits, then Foley catheter was discontinued and he was advanced from  clear liquids to a full diet.  May 10, 2002 Tmax was 101.9 degrees  Fahrenheit felt to be due to atelectasis.  Pulmonary toilet was increased  and the pathology was discussed in detail with him at that time.  By  postoperative day three May 11, 2002 he was comfortable, again ambulating,  advanced to a regular diet, and was subsequently discharged on May 12, 2002  doing well, ambulating, wound clear.  Bowel movements were noted.  He was  discharged on Tylox and Colace.  He was to return the following week for  staple removal.  Specific instructions  were given and discharge condition was improved.  The final pathology  revealed a renal cell  carcinoma with negative surgical margins.  It was a  PT1B PNX PMX nuclear grade 2/4, maximum diameter 5.3 cm.  It was a  chromophil subpapularly variant.                                               [place] Parks Neptune, M.D.    RLD/MEDQ  D:  06/03/2002  T:  06/07/2002  Job:  [id number]   cc:   Dutch Gray, MD  [place], [place] [address]

## 2011-03-12 NOTE — Discharge Summary (Signed)
NAMEVUE, BEYLER NO.:  [id number]   MEDICAL RECORD NO.:  [id number]          PATIENT TYPE:  INP   LOCATION:  [id number]                         FACILITY:  [facility]   PHYSICIAN:  Eden Lathe. Einar Gip, MD       DATE OF BIRTH:  July 13, 1961   DATE OF ADMISSION:  10/12/2005  DATE OF DISCHARGE:  10/13/2005                                 DISCHARGE SUMMARY   DIAGNOSES:  1.  Chest discomfort.  2.  Dyspnea on exertion.  3.  Palpitations.  4.  History of right nephrectomy.  5.  Hypertension.  6.  Lumbar disc disease.  7.  Gastroesophageal reflux disease.  8.  Abnormal Cardiolite.  9.  Recent tobacco cessation.  10. Continued alcohol use.  11. Renal insufficiency.   CONDITION ON DISCHARGE:  Improved and stable.   PROCEDURE:  Combined left heart cath October 13, 2005, by Dr. Eden Lathe. Gan[place]  with minimal noncritical coronary artery disease with 20% RCA stenosis, 40  to 50% circumflex stenosis and 40% LAD stenosis.  EF was 65%.  Patient  received 35 to 38 mL of contrast.   MEDICATIONS:  1.  Protonix 40 mg daily.  2.  Stop AcipHex.  3.  Cardizem CD 360 mg daily.  4.  Toprol XL 50 daily.  5.  Vytorin 10/40 daily.  6.  Tylox.  7.  Diazepam.  8.  Gas-X as before.   Decrease alcohol to one beer a day.   DISCHARGE INSTRUCTIONS:  1.  Low fat, low salt diet.  2.  No lifting over five pounds for three days.  3.  No strenuous activity for three days.  4.  Wash cath site with soap and water.  Call if any bleeding, swelling or      drainage.  5.  See Dr. Mathis Bud in one week, the office should call you with date and      time.  6.  Have blood work done on Friday morning early to check your kidney.   HISTORY OF PRESENT ILLNESS:  Mr. Ancheta has been worked up by Dr. Odette Fraction as an outpatient secondary to chest pain for further evaluation.  Prior to this cath, his LDL had been 183.  He was started on Vytorin.  He  was smoking up to three packs per day.  He has since stopped  smoking.  He  does have a history of a single kidney with creatinine running 1.6 to 1.8.  Patient was brought in the day prior to the procedure for IV fluids and  Mucomyst as well as bicarb drip which he used.  Patient did develop anxiety  related issues perhaps due to alcohol cessation, but then was able to  control himself and stay throughout the hospitalization without further  issues.   Patient underwent cardiac catheterization on October 13, 2005, with results  as stated previously.  He did well.  He was closed with StarClose and would  be able to ambulate in two hours.   GENERAL:  Alert and oriented.  VITAL SIGNS:  Blood pressure 106/62 to 124/82, pulse  in the 60s, sats 97 to  98%.  HEART:  Regular rate and rhythm.  LUNGS:  Clear.   Right groin stable.   LABORATORY DATA:  Prior to procedure, hemoglobin 14.3, hematocrit 40.3, wbc  6.7, platelets 220.  Sodium 137, potassium 4.2, BUN 15, creatinine 1.8,  glucose 113.   HOSPITAL COURSE:  Patient was brought in the day before procedure for IV  fluids which he used and then underwent cardiac catheterization on October 13, 2005, with results as stated.  Post procedure, Dr. Einar Gip felt four hours  of IV fluids as well as bicarb drip were sufficient and I also instructed  patient to increase all waters and juices at home for the next few days and  then he will have lab work done on Friday morning STAT to check his kidney  function since he only has a single kidney.   He will follow up with Dr. Mathis Bud.      Otilio Carpen. [place], N.P.      Eden Lathe. Einar Gip, MD  Electronically Signed    LRI/MEDQ  D:  10/13/2005  T:  10/15/2005  Job:  [id number]   cc:   Leslye Peer, MD  Fax: [place]. Gerarda Fraction, MD  Fax: [phone]

## 2011-03-12 NOTE — Op Note (Signed)
NAME:  [name], [name]                         ACCOUNT NO.:  [id number]   MEDICAL RECORD NO.:  [id number]                   PATIENT TYPE:  AMB   LOCATION:  DAY                                  FACILITY:  [facility]   PHYSICIAN:  Carole Civil, M.D.           DATE OF BIRTH:  20-Jun-1961   DATE OF PROCEDURE:  08/22/2003  DATE OF DISCHARGE:                                 OPERATIVE REPORT   INDICATIONS FOR PROCEDURE:  [name] is a 50 year old male who had a kidney  transplant and during his premorbid stage of nephritis had been placed on  steroids and developed lumbar spine and left hip pain in the postoperative  period after his nephrectomy.  He presented with left hip pain and an MRI  showing avascular necrosis of the left hip.  It was in a stage 2, although  there was diffuse involvement of the femoral head.  No collapse, or  subchondral fracture was noted.  He presented for a left core decompression  with bone graft.   PREOPERATIVE DIAGNOSIS:  Avascular necrosis, left hip.   POSTOPERATIVE DIAGNOSIS:  Avascular necrosis, left hip.   PROCEDURE:  Core decompression with bone graft.  Bone grafting material used  included cancellous chips, 15 cc; DBX putty, 5 cc.   SURGEON:  Carole Civil, M.D.   ANESTHESIA:  General by LMA.   DESCRIPTION OF PROCEDURE:  The patient was identified as Brandon Buck. Dimond in  the preoperative holding area.  He was given 1 g of Ancef, and my initials  were placed over the mark which he made over the operative site.  After  reviewing the medical record and consent, it was confirmed that the left hip  was for surgery and that he was to have a core decompression with bone  graft.   He was taken to the operating room and given LMA general anesthetic.  He was  placed on the fracture table with padding of his well leg and perineal post.  His left leg was placed in a traction device, but no traction was applied.   AP and lateral radiographs of the  femoral head and neck and hip area were  used to do the surgery.  After sterile prep and drape and time out, which  confirmed the operative procedure and the patient and the correct limb, we  proceeded with a small incision over the greater trochanter just inferior to  it, divided this tissue down to bone, and performed a subperiosteal  dissection and a took a guide pin and placed it in the middle of the femoral  head.  AP and lateral x-rays confirmed the position.  We took the triple  reamer, passed it over the guidewire, took the guidewire out, and packed the  defect with bone graft, as stated above.   The wound was irrigated and closed with 0 Vicryl, 2-0 Vicryl, and staples.  We also added 30  cc of 0.5% Sensorcaine to assist with postoperative  anesthesia.   He was taken to the recovery room after extubation.  He was in stable  condition.  The postoperative plan is for him to follow up tomorrow for a  dressing change and to be on crutches nonweightbearing for a period of six  weeks.      ___________________________________________                                            Carole Civil, M.D.   SEH/MEDQ  D:  08/22/2003  T:  08/22/2003  Job:  [id number]

## 2011-03-12 NOTE — H&P (Signed)
NAMEMarland Buck  ANTHANY, POLIT NO.:  [id number]   MEDICAL RECORD NO.:  [id number]          PATIENT TYPE:  INP   LOCATION:  A203                          FACILITY:  [facility]   PHYSICIAN:  Sherrilee Gilles. Gerarda Fraction, MD  DATE OF BIRTH:  Dec 15, 1960   DATE OF ADMISSION:  04/28/2006  DATE OF DISCHARGE:  LH                                HISTORY & PHYSICAL   CHIEF COMPLAINT:  Syncope.   HISTORY OF PRESENT ILLNESS:  The patient was seen in the emergency  department two days prior to office visitation with severe polymyalgia,  fever, and malaise.  He has had some stomach cramps and pain.  His work-up  he reports to me as negative including blood work and chest x-ray.  He  states he was told he had a viral syndrome combined with prostatitis and  sinusitis as well as an unidentified viral infection.   PAST MEDICAL HISTORY:  1.  Status post renal carcinoma resection in unilateral kidney with some      renal insufficiency as well at baseline.  Being seen by Dr. Lowanda Foster for      that.  2.  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  3.  He has had previous H pylori treated.  4.  Hyperlipidemia.  5.  He had 50% noncritical coronary artery disease by catheterization in      2006 as well as a history of labile hypertension.   SOCIAL HISTORY:  Positive smoker.  Married.  Lives with wife.  Negative  drugs.  Positive occasional alcohol use.   FAMILY HISTORY:  Positive for coronary artery disease, prostate cancer,  kidney cancer, bladder cancer.  Otherwise negative.   REVIEW OF SYSTEMS:  The patient admits to lightheadedness and a near  syncopal episode prior to coming into the office.   PHYSICAL EXAMINATION:  GENERAL:  He appears pale and sweaty.  HEAD AND NECK:  No JVD or adenopathy.  Neck is supple although he did admit  to a headache.  CHEST: Clear.  CARDIAC:  Regular rate and rhythm.  No murmur, gallop or rub.  ABDOMEN:  Soft.  No organomegaly or masses.  No guarding or  rebound  tenderness.  EXTREMITIES:  Without clubbing, cyanosis or edema.  SKIN:  Specifically skin exam was soft and history was soft for tick bites  and there was no evidence of that.  He had no exam limits; specifically no  petechiae.  NEUROLOGIC:  Nonfocal.   IMPRESSION:  1.  Will assume that the prostatitis diagnosed is based on a positive      urinalysis.  However, most of his symptoms are consistent with [facility] Spotted Fever.  He will be started on doxycycline parenterally      to cover this possibility as well as more broad-spectrum antibiotic      coverage pending culture results.  2.  Hypertension.  Monitor and intervene as needed.  3.  Recurrent syncope.  The patient had another syncopal episode during a      lab draw in the office.  We are going to have to monitor him closely.      We are going to do cardiac enzymes as well since he had a previous      noncritical disease a year ago.  Of course this could have progressed.  4.  Renal carcinoma, postoperative.  Monitor, intervene as needed.  5.  Renal insufficiency. Check electrolytes and BUN and creatinine.      Sherrilee Gilles. Gerarda Fraction, MD  Electronically Signed     LJF/MEDQ  D:  04/28/2006  T:  04/28/2006  Job:  [id number]

## 2011-03-12 NOTE — Cardiovascular Report (Signed)
NAMEREX, SULLO NO.:  [id number]   MEDICAL RECORD NO.:  [id number]          PATIENT TYPE:  INP   LOCATION:  [id number]                         FACILITY:  [facility]   PHYSICIAN:  Eden Lathe. Einar Gip, MD       DATE OF BIRTH:  04-17-1961   DATE OF PROCEDURE:  10/13/2005  DATE OF DISCHARGE:  10/13/2005                              CARDIAC CATHETERIZATION   PROCEDURE PERFORMED:  1.  Left heart catheterization.  2.  Left coronary arteriography.  3.  Right femoral angiography.  4.  Closure of right femoral access with Starclose.   INDICATIONS:  Mr. Goldsboro is a 50 year old gentleman with a history of  excessive alcohol use, prior smoking, hypertension, hyperlipidemia, who had  been complaining of chest discomfort. He had undergone a Cardiolite stress  test previously and it had shown mild inferior wall ischemia and possible  septal ischemia. Given this, he was sent for a cardiac catheterization. He  has a chronic renal insufficiency and a single kidney secondary to  nephrectomy secondary to cancer in the past. Hence, he was hydrated with  bicarb drip prior to the procedure.   HEMODYNAMIC DATA:  The left ventricle had a pressure of 118/ with an end-  diastolic pressure of 9 mmHg. The aortic pressure 123/78 with a mean of 98  mmHg. There was no pressure gradient across the aortic valve.   ANGIOGRAPHIC DATA:  Left ventricle:  Left ventricle was normal with an  ejection fraction of 65%. There was no obvious regional wall motion  abnormality.   Right coronary artery: The right coronary artery was a large caliber vessel  and a dominant vessel. It has got mild calcification. There was mild luminal  irregularity in the proximal and mid segment. It gives origin to a moderate  size PDA and the large PLA.   Left main: Left main is a moderate to large caliber vessel. It has got mild  calcification.   Circumflex. The circumflex is a moderate caliber vessel and gives origin to  a  moderate size obtuse marginal-1 and continues as an OM-2 after giving AV-  groove branch. There is a 40%-50% mid circumflex stenosis. The OM-1 is  normal.   LAD: The LAD is a moderate to large caliber vessel. There is mild  calcification noted in the proximal LAD. The mid segment of the LAD has a  40% stenosis.   IMPRESSION:  Mild noncritical coronary artery disease. There is a moderate  disease of the circumflex which constitutes about 40, at the most 50%  stenosis. This is in the mid segment. The LAD has mild diffuse calcification  proximally, however, there is no significant stenosis but about 30%-40% mid  stenosis. Mild luminal irregularity of the proximal and mid ostia and  preserved ejection fraction of 65%.   RECOMMENDATION:  Based on coronary anatomy, I agree for risk factor  modification with decrease in alcohol intake, exercise, healthy eating  habits is indicated. Otherwise, continued primary prevention is indicated.  He needs to be on aspirin indefinitely. The patient will follow-up with Dr.  Odette Fraction. A BMET  will be obtained in about 2-3 days following cardiac  catheterization. Only 35-38 cc of contrast was utilized for diagnostic  angiography.   TECHNIQUE OF PROCEDURE:  Under usual sterile precautions, using a #6 French  right femoral artery access, a 6-French multipurpose B2 catheter was  advanced from the ascending aorta over the #0.02 French pigtail catheter was  utilized to engage the left main coronary artery and angiography was  performed. Then the catheter was manipulated to engage the right coronary  artery. An angiography was performed. Then the catheter was gently advanced  into the left ventricle. Left ventricular pressures were monitored and a  hand contrast injection of the left ventricle was performed in the RAO  projection. The catheter was then flushed with saline and pulled back into  the ascending aorta and then pressure gradient across the aortic  valve was  monitored. The catheter was then pulled out of the body in the usual  fashion. The patient tolerated the procedure. No immediate complications  were noted. Right femoral angiography was performed with arterial access  sheath, and access was closed with Starclose with excellent hemostasis.      Eden Lathe. Einar Gip, MD  Electronically Signed     JRG/MEDQ  D:  10/13/2005  T:  10/15/2005  Job:  [id number]

## 2011-03-12 NOTE — Discharge Summary (Signed)
NAMEMarland Kitchen  DARRIEN, PENTLAND NO.:  [id number]   MEDICAL RECORD NO.:  [id number]          PATIENT TYPE:  INP   LOCATION:  A203                          FACILITY:  [facility]   PHYSICIAN:  Sherrilee Gilles. Gerarda Fraction, MD  DATE OF BIRTH:  11-Jul-1961   DATE OF ADMISSION:  04/28/2006  DATE OF DISCHARGE:  LH                                 DISCHARGE SUMMARY   DISCHARGE DIAGNOSES:  1.  Clinical [facility] spotted fever.  2.  Vagal syncope.  3.  Chronic renal insufficiency.  4.  Unilateral kidney, post unilateral kidney resection for renal carcinoma.  5.  Hyperlipidemia.  6.  Non critical coronary disease diagnosed 2006.  7.  Hypertension.  8.  History of avascular femoral necrosis, bilateral hips.   DISCHARGE MEDICATIONS:  1.  Vibramycin 100 mg p.o. b.i.d.  2.  Diltiazem 360 mg p.o. q. day.  3.  Toprol XL 100 mg p.o. q. day.  4.  Protonix 40 mg p.o. q. day.   SUMMARY:  The patient was admitted after clinical assessment in the office  consistent with [facility] spotted fever.  During a lab draw he had a  recurrent syncopal episode.  Due to his history of known noncritical  coronary disease as well as hypotension in the office, he was admitted for  the syncope to rule out any cardiac complications.  He responded very well  to IV hydration and parenteral doxicycline.  His serology for [facility]  spotted fever titers are pending at present.  On the day of discharge, his  vital signs are stable.  He is asymptomatic.  Follow up in the office 1  week.      Sherrilee Gilles. Gerarda Fraction, MD  Electronically Signed     LJF/MEDQ  D:  04/30/2006  T:  04/30/2006  Job:  [id number]

## 2011-03-12 NOTE — H&P (Signed)
[facility]  Patient:    Brandon Buck, Brandon Buck Visit Number: [id number] MRN: [id number]          Service Type: SUR Location: 3W [id number] Attending Physician:  Paul Dykes Dictated by:   Duane Lope. Parks Neptune, M.D. Admit Date:  05/08/2002                           History and Physical  CHIEF COMPLAINT:  "I have a cancer in my kidney."  HISTORY OF PRESENT ILLNESS:  The patient is a very nice 50 year old white male who has had a progressive 15 pound weight loss over two months.  He has had some shortness of breath.  He also has emphysema and is a heavy smoker.  He underwent a CT scan of the abdomen and pelvis by Dr. Gerarda Fraction, that revealed a 4.5 mm heterogenous lesion with enhancement associated with the anterior of the right kidney extending into the hilum, suspicious for primary kidney cancer.  He has considered risks, benefits, and alternatives, and elected to proceed with a right radial nephrectomy.  A CT scan of the chest revealed no evidence of metastatic disease.  ALLERGIES:  No known drug allergies.  He did questionably have a seizure after an anesthetic in the past, but has had anesthesia since then without problems.  ILLNESSES:  He has had a bleeding ulcer in the past, bleeding disorders, irritable bowel syndrome.  PAST MEDICAL AND SURGICAL HISTORY: 1. He has had two hernia surgeries with some nerve damage. 2. Back trouble. 3. Bronchitis as noted. 4. Sebaceous cyst off his back. 5. Multiple hernia surgeries.  FAMILY HISTORY:  Noncontributory.  SOCIAL HISTORY:  He is a beer drinker, and smokes two to three packs of cigarettes per day x25 years.  REVIEW OF SYSTEMS:  He occasionally has slight shortness of breath and coughing.  He has no chest pain, no significant GI complaints recently, although he does have irritable bowel syndrome from time to time.  He has seen no blood in his urine.  He has not had pain in his flanks of  significance.  PHYSICAL EXAMINATION:  VITAL SIGNS:  Blood pressure 150/80, pulse 84, respiratory rate 18, temperature 98.7 degrees Fahrenheit.  GENERAL:  He is well-developed, well-nourished, well-groomed, in no acute distress, oriented x3.  HEENT:  Pupils are equal, round and reactive to light and accommodation. Ears, nose, and throat clear.  NECK:  Without masses or thyromegaly.  CHEST:  Clear anteriorly and posteriorly without rales or rhonchi.  ABDOMEN:  Soft, nontender, no masses, organomegaly, or hernias.  EXTREMITIES:  Normal.  NEUROLOGIC:  Intact.  SKIN:  Normal.  GENITOURINARY:  Penis, meatus, scrotum, testicle, adnexa, anus, and perineum are normal.  RECTAL:  Prostate is 25 g, smooth, symmetrical, and benign.  IMPRESSION:  Heterogenous right lesion consistent with probable renal cell carcinoma.  PLAN:  Right radial nephrectomy. Dictated by:   Duane Lope. Parks Neptune, M.D. Attending Physician:  Paul Dykes DD:  05/08/02 TD:  05/10/02 Job: [id number]

## 2011-04-25 ENCOUNTER — Emergency Department (HOSPITAL_COMMUNITY)
Admission: EM | Admit: 2011-04-25 | Discharge: 2011-04-25 | Disposition: A | Discharge status: Home / Self Care | Payer: Medicare Other | Attending: Emergency Medicine | Admitting: Emergency Medicine

## 2011-04-25 DIAGNOSIS — E781 Pure hyperglyceridemia: Secondary | ICD-10-CM | POA: Insufficient documentation

## 2011-04-25 DIAGNOSIS — Z79899 Other long term (current) drug therapy: Secondary | ICD-10-CM | POA: Insufficient documentation

## 2011-04-25 DIAGNOSIS — I2510 Atherosclerotic heart disease of native coronary artery without angina pectoris: Secondary | ICD-10-CM | POA: Insufficient documentation

## 2011-04-25 DIAGNOSIS — I10 Essential (primary) hypertension: Secondary | ICD-10-CM | POA: Insufficient documentation

## 2011-04-25 DIAGNOSIS — R51 Headache: Secondary | ICD-10-CM | POA: Insufficient documentation

## 2011-04-25 DIAGNOSIS — R5383 Other fatigue: Secondary | ICD-10-CM | POA: Insufficient documentation

## 2011-04-25 DIAGNOSIS — Z87891 Personal history of nicotine dependence: Secondary | ICD-10-CM | POA: Insufficient documentation

## 2011-04-25 DIAGNOSIS — R5381 Other malaise: Secondary | ICD-10-CM | POA: Insufficient documentation

## 2011-04-25 DIAGNOSIS — IMO0001 Reserved for inherently not codable concepts without codable children: Secondary | ICD-10-CM | POA: Insufficient documentation

## 2011-07-19 LAB — D-DIMER, QUANTITATIVE: D-Dimer, Quant: 0.3

## 2011-07-19 LAB — TROPONIN I: Troponin I: 0.53

## 2011-07-19 LAB — BASIC METABOLIC PANEL
BUN: 17
BUN: 12
BUN: 15
CO2: 24
CO2: 28
CO2: 28
Calcium: 8.8
Calcium: 8.9
Calcium: 9
Calcium: 9.6
Chloride: 103
Chloride: 105
Chloride: 106
Chloride: 108
Creatinine, Ser: 1.55 — ABNORMAL HIGH
Creatinine, Ser: 1.67 — ABNORMAL HIGH
Creatinine, Ser: 1.75 — ABNORMAL HIGH
Creatinine, Ser: 1.78 — ABNORMAL HIGH
Creatinine, Ser: 1.82 — ABNORMAL HIGH
GFR calc Af Amer: 50 — ABNORMAL LOW
GFR calc Af Amer: 51 — ABNORMAL LOW
GFR calc Af Amer: 54 — ABNORMAL LOW
GFR calc Af Amer: 58 — ABNORMAL LOW
GFR calc non Af Amer: 38 — ABNORMAL LOW
GFR calc non Af Amer: 42 — ABNORMAL LOW
GFR calc non Af Amer: 48 — ABNORMAL LOW
Glucose, Bld: 99
Glucose, Bld: 100 — ABNORMAL HIGH
Potassium: 3.6
Potassium: 3.8
Sodium: 137
Sodium: 136
Sodium: 139

## 2011-07-19 LAB — CBC
HCT: 40.4
HCT: 35.6 — ABNORMAL LOW
Hemoglobin: 14.2
Hemoglobin: 12.6 — ABNORMAL LOW
Hemoglobin: 12.7 — ABNORMAL LOW
MCHC: 34.8
MCHC: 34.9
MCHC: 35.1
MCHC: 35.5
MCV: 90.3
MCV: 91.6
MCV: 91.7
MCV: 91.7
MCV: 91.9
Platelets: 221
Platelets: 153
Platelets: 160
Platelets: 187
RBC: 3.89 — ABNORMAL LOW
RBC: 3.93 — ABNORMAL LOW
RBC: 3.95 — ABNORMAL LOW
RDW: 12.4
RDW: 12.5
RDW: 12.6
RDW: 12.6
WBC: 10.3
WBC: 9.3

## 2011-07-19 LAB — DIFFERENTIAL
Basophils Absolute: 0
Eosinophils Absolute: 0.2
Eosinophils Relative: 2
Lymphocytes Relative: 22
Lymphs Abs: 1.8
Monocytes Absolute: 0.8

## 2011-07-19 LAB — CARDIAC PANEL(CRET KIN+CKTOT+MB+TROPI)
CK, MB: 0.8
CK, MB: 11.5 — ABNORMAL HIGH
Relative Index: 6.7 — ABNORMAL HIGH
Relative Index: 7.4 — ABNORMAL HIGH
Relative Index: 8.8 — ABNORMAL HIGH
Relative Index: INVALID
Relative Index: INVALID
Relative Index: INVALID
Total CK: 123
Total CK: 131
Total CK: 65
Troponin I: 0.26 — ABNORMAL HIGH
Troponin I: 0.33 — ABNORMAL HIGH
Troponin I: 0.39 — ABNORMAL HIGH
Troponin I: 1.28
Troponin I: 1.43

## 2011-07-19 LAB — HEPARIN LEVEL (UNFRACTIONATED)
Heparin Unfractionated: 0.27 — ABNORMAL LOW
Heparin Unfractionated: 0.74 — ABNORMAL HIGH

## 2011-07-19 LAB — POCT CARDIAC MARKERS
CKMB, poc: 1.3
Myoglobin, poc: 133
Operator id: 240821
Troponin i, poc: 0.26 — ABNORMAL HIGH

## 2011-07-19 LAB — CK TOTAL AND CKMB (NOT AT ARMC): Relative Index: INVALID

## 2011-07-19 LAB — PROTIME-INR: Prothrombin Time: 13.2

## 2011-09-30 ENCOUNTER — Other Ambulatory Visit (HOSPITAL_COMMUNITY): Payer: Self-pay | Admitting: Nephrology

## 2011-10-05 ENCOUNTER — Ambulatory Visit (HOSPITAL_COMMUNITY)
Admission: RE | Admit: 2011-10-05 | Discharge: 2011-10-05 | Disposition: A | Discharge status: Home / Self Care | Payer: Medicare Other | Source: Ambulatory Visit | Attending: Nephrology | Admitting: Nephrology

## 2011-10-05 DIAGNOSIS — C649 Malignant neoplasm of unspecified kidney, except renal pelvis: Secondary | ICD-10-CM | POA: Insufficient documentation

## 2011-10-05 DIAGNOSIS — K5730 Diverticulosis of large intestine without perforation or abscess without bleeding: Secondary | ICD-10-CM | POA: Insufficient documentation

## 2011-10-05 DIAGNOSIS — N400 Enlarged prostate without lower urinary tract symptoms: Secondary | ICD-10-CM | POA: Insufficient documentation

## 2012-03-01 ENCOUNTER — Ambulatory Visit (INDEPENDENT_AMBULATORY_CARE_PROVIDER_SITE_OTHER): Payer: Medicare Other | Admitting: Orthopedic Surgery

## 2012-03-01 ENCOUNTER — Encounter: Payer: Self-pay | Admitting: Orthopedic Surgery

## 2012-03-01 VITALS — Ht 73.0 in | Wt 190.0 lb

## 2012-03-01 DIAGNOSIS — M722 Plantar fascial fibromatosis: Secondary | ICD-10-CM | POA: Insufficient documentation

## 2012-03-01 NOTE — Patient Instructions (Signed)
You have received a steroid shot. 15% of patients experience increased pain at the injection site with in the next 24 hours. This is best treated with ice and tylenol extra strength 2 tabs every 8 hours. If you are still having pain please call the office.    

## 2012-03-01 NOTE — Progress Notes (Signed)
  Subjective:    Brandon Buck is a 51 y.o. male who presents with bilateral foot pain starting 5 months ago with gradual onset of symptoms. He now complains of sharp, dull, throbbing, stabbing, burning 8/10 pain, which is worse after restarting after sitting. No trauma.  Previous treatment included stretching.  He has one functioning kidney with a creatinine of 1.7-1.9, in approximately 20-30% kidney function.  Review of systems, chest pain, shortness of breath, tightness, snoring, heartburn, joint pain and swelling, anxiety, easy bleeding and bruising, seasonal allergies. The following portions of the patient's history were reviewed and updated as appropriate: allergies, current medications, past family history, past medical history, past social history, past surgical history and problem list.  Review of Systems Pertinent items are noted in HPI.    Objective:    Ht 6\' 1"  (1.854 m)  Wt 190 lb (86.183 kg)  BMI 25.07 kg/m2 Physical Exam(12) GENERAL: normal development   CDV: pulses are normal   Skin: normal  Lymph: nodes were not palpable/normal  Psychiatric: awake, alert and oriented  Neuro: normal sensation  MSK ambulation without assistive devices.  Bilateral foot. Examination shows normal alignment of the foot. There is no evidence of fallen arch. LEFT heel is tender at the calcaneal tuberosity. The RIGHT is also tender, but not as much. He has normal range of motion in the foot. There is no atrophy. There is good color, and normal perfusion.  I Assessment:    Plantar fasciitis    Plan:    Home exercise plan outlined. Steroid injection to heel/ plantar fascia insertion performed. heel cups    If not improved rec Podiatrist

## 2013-02-01 ENCOUNTER — Other Ambulatory Visit: Payer: Self-pay | Admitting: Family Medicine

## 2013-02-01 MED ORDER — CLOPIDOGREL BISULFATE 75 MG PO TABS: 75.0000 mg | ORAL_TABLET | Freq: Every day | ORAL | Status: DC

## 2013-02-06 ENCOUNTER — Telehealth: Payer: Self-pay | Admitting: Family Medicine

## 2013-02-06 NOTE — Telephone Encounter (Signed)
?  to refill

## 2013-02-06 NOTE — Telephone Encounter (Signed)
Ok to refill 

## 2013-02-06 NOTE — Telephone Encounter (Signed)
Script called in;pt aware

## 2013-03-07 ENCOUNTER — Telehealth: Payer: Self-pay | Admitting: Family Medicine

## 2013-03-07 NOTE — Telephone Encounter (Signed)
Ok to refill 

## 2013-03-07 NOTE — Telephone Encounter (Signed)
norco 5/325mg  take one tablet by mouth TID daily last refill 02/06/2013.

## 2013-03-08 MED ORDER — HYDROCODONE-ACETAMINOPHEN 5-325 MG PO TABS: 1.0000 | ORAL_TABLET | Freq: Three times a day (TID) | ORAL | Status: DC

## 2013-03-08 NOTE — Telephone Encounter (Signed)
Rx Refilled  

## 2013-04-10 ENCOUNTER — Telehealth: Payer: Self-pay | Admitting: Family Medicine

## 2013-04-10 MED ORDER — HYDROCODONE-ACETAMINOPHEN 5-325 MG PO TABS: 1.0000 | ORAL_TABLET | Freq: Three times a day (TID) | ORAL | Status: DC

## 2013-04-10 NOTE — Telephone Encounter (Signed)
?   OK to Refill  

## 2013-04-10 NOTE — Telephone Encounter (Signed)
.  Rx Refilled and faxed

## 2013-04-10 NOTE — Telephone Encounter (Signed)
Ok to refill 

## 2013-05-07 ENCOUNTER — Other Ambulatory Visit: Payer: Self-pay | Admitting: Family Medicine

## 2013-05-10 ENCOUNTER — Other Ambulatory Visit: Payer: Self-pay | Admitting: Family Medicine

## 2013-05-10 NOTE — Telephone Encounter (Signed)
Ok to refill 

## 2013-05-10 NOTE — Telephone Encounter (Signed)
Med refilled.

## 2013-06-07 ENCOUNTER — Other Ambulatory Visit: Payer: Self-pay | Admitting: Family Medicine

## 2013-06-07 NOTE — Telephone Encounter (Signed)
ok 

## 2013-06-07 NOTE — Telephone Encounter (Signed)
?   OK to Refill  

## 2013-06-21 ENCOUNTER — Ambulatory Visit (INDEPENDENT_AMBULATORY_CARE_PROVIDER_SITE_OTHER): Payer: Medicare Other | Admitting: Family Medicine

## 2013-06-21 ENCOUNTER — Encounter: Payer: Self-pay | Admitting: Family Medicine

## 2013-06-21 VITALS — BP 130/70 | HR 82 | Temp 98.3°F | Resp 14 | Wt 196.0 lb

## 2013-06-21 DIAGNOSIS — K219 Gastro-esophageal reflux disease without esophagitis: Secondary | ICD-10-CM

## 2013-06-21 DIAGNOSIS — E65 Localized adiposity: Secondary | ICD-10-CM

## 2013-06-21 LAB — CBC WITH DIFFERENTIAL/PLATELET
Basophils Relative: 1 % (ref 0–1)
Eosinophils Absolute: 0.3 10*3/uL (ref 0.0–0.7)
Eosinophils Relative: 5 % (ref 0–5)
Hemoglobin: 14.4 g/dL (ref 13.0–17.0)
Lymphs Abs: 1.5 10*3/uL (ref 0.7–4.0)
MCH: 30.9 pg (ref 26.0–34.0)
MCHC: 35.1 g/dL (ref 30.0–36.0)
MCV: 88 fL (ref 78.0–100.0)
Monocytes Relative: 9 % (ref 3–12)
Neutrophils Relative %: 61 % (ref 43–77)
RBC: 4.66 MIL/uL (ref 4.22–5.81)

## 2013-06-21 MED ORDER — PANTOPRAZOLE SODIUM 40 MG PO TBEC: 40.0000 mg | DELAYED_RELEASE_TABLET | Freq: Two times a day (BID) | ORAL | Status: DC

## 2013-06-21 NOTE — Progress Notes (Signed)
Subjective:    Patient ID: Brandon Buck, male    DOB: October 24, 1961, 52 y.o.   MRN: [id number]  HPI Patient was getting out of the shower yesterday when he noticed an asymmetric growth underneath his left arm compared to his right arm. In the left axilla there is a fatty growth which is noticeably larger than the fat tissue in his right axilla. Is soft, nontender, poorly circumscribed. It has the consistency of a lipoma. It is approximately 2 cm x 5 cm  He also complains of worsening reflux. He denies any melena or hematochezia. He denies any nausea or vomiting. He reports indigestion and bitter burning fluid coming up in the back of his mouth when he lies down at night. This is unrelieved by pantoprazole 40 mg once a day.  He denies any weight loss. He has a history of peptic ulcer disease and Helicobacter pylori infection. Past Medical History  Diagnosis Date  . Heart disease   . Kidney disease   . High blood pressure    Past Surgical History  Procedure Laterality Date  . Hernia repair    . Hip surgery    . Kidney removed    . Heart catherization     Current Outpatient Prescriptions on File Prior to Visit  Medication Sig Dispense Refill  . aspirin 81 MG tablet Take 81 mg by mouth daily.      Marland Kitchen atorvastatin (LIPITOR) 40 MG tablet TAKE 1 TABLET BY MOUTH ONCE DAILY AT BEDTIME.  30 tablet  2  . clopidogrel (PLAVIX) 75 MG tablet Take 1 tablet (75 mg total) by mouth daily.  30 tablet  5  . HYDROcodone-acetaminophen (NORCO/VICODIN) 5-325 MG per tablet TAKE (1) TABLET BY MOUTH (3) TIMES DAILY.  90 tablet  0  . nitroGLYCERIN (NITROSTAT) 0.4 MG SL tablet Place 0.4 mg under the tongue every 5 (five) minutes as needed.       No current facility-administered medications on file prior to visit.   No Known Allergies History   Social History  . Marital Status: Married    Spouse Name: N/A    Number of Children: N/A  . Years of Education: N/A   Occupational History  . Not on file.   Social  History Main Topics  . Smoking status: Former Research scientist (life sciences)  . Smokeless tobacco: Not on file  . Alcohol Use: No  . Drug Use: No  . Sexual Activity: Not on file   Other Topics Concern  . Not on file   Social History Narrative  . No narrative on file      Review of Systems  All other systems reviewed and are negative.       Objective:   Physical Exam  Vitals reviewed. Cardiovascular: Normal rate, regular rhythm and normal heart sounds.   No murmur heard. Pulmonary/Chest: Effort normal and breath sounds normal. No respiratory distress. He has no wheezes. He has no rales. He exhibits no tenderness.  Abdominal: Soft. Bowel sounds are normal. He exhibits no distension. There is no tenderness. There is no rebound and no guarding.   2 cm x 5 cm extra fat deposit in the left axilla which is likely larger than the right axilla do to the fact that patient is right arm dominant and uses the muscles in his right arm more than his left.        Assessment & Plan:  1. GERD (gastroesophageal reflux disease) Check CBC to rule out an occult anemia from upper GI blood  loss secondary to his two antiplatelet agents.  Increase pantoprazole to 40 mg by mouth twice a day and recheck in 2-3 weeks. If symptoms persist recommend EGD. - CBC with Differential - pantoprazole (PROTONIX) 40 MG tablet; Take 1 tablet (40 mg total) by mouth 2 (two) times daily.  Dispense: 60 tablet; Refill: 5  2. Hyperplasia, fatty tissue It is not well circumscribed as one would expect with a lipoma. I believe it is just extra fat tissue it happens to be more pronounced in the left axilla than in the right axilla. I am not concerned about malignancy.

## 2013-07-09 ENCOUNTER — Other Ambulatory Visit: Payer: Self-pay | Admitting: Family Medicine

## 2013-07-09 NOTE — Telephone Encounter (Signed)
?   OK to Refill  

## 2013-07-09 NOTE — Telephone Encounter (Signed)
ok 

## 2013-07-10 NOTE — Telephone Encounter (Signed)
Rx called in.  Left patient mess to call back to inform effective next month Hydrocodone refills will need to be picked up in person at office. We can no longer call into pharmacy.

## 2013-07-10 NOTE — Telephone Encounter (Signed)
Pt called back and aware of refill changes for next month

## 2013-08-06 ENCOUNTER — Telehealth: Payer: Self-pay | Admitting: Family Medicine

## 2013-08-06 MED ORDER — HYDROCODONE-ACETAMINOPHEN 5-325 MG PO TABS: ORAL_TABLET | ORAL | Status: DC

## 2013-08-06 NOTE — Telephone Encounter (Signed)
RX printed, left up front and patient aware to pick up per vm 

## 2013-08-06 NOTE — Telephone Encounter (Signed)
Needs Hydrocodone refilled  .? OK to Refill

## 2013-08-06 NOTE — Telephone Encounter (Signed)
ok 

## 2013-09-05 ENCOUNTER — Telehealth: Payer: Self-pay | Admitting: *Deleted

## 2013-09-05 NOTE — Telephone Encounter (Signed)
?   OK to Refill  

## 2013-09-06 MED ORDER — HYDROCODONE-ACETAMINOPHEN 5-325 MG PO TABS: ORAL_TABLET | ORAL | Status: DC

## 2013-09-06 NOTE — Telephone Encounter (Signed)
This pt is on pain medication for chronic left hip pain and Dr. Dennard Schaumann only requires him to come in once a year for his med check on his pain medication. He was last seen for med check 12/13 so he is not due for OV til December.

## 2013-09-06 NOTE — Telephone Encounter (Signed)
Med refilled/printed

## 2013-09-06 NOTE — Telephone Encounter (Signed)
Please pull his paper chart to see if there is documentation  as to why he is even on this pain pill. He has only had one office visit here  since we have been on epic and that was in August. That was her other problems but no mention of pain medicines. If His paper chart does not reveal any recent visits regarding his pain, then tell him he needs to schedule an office visit to evaluate.

## 2013-09-06 NOTE — Telephone Encounter (Signed)
Approved.  

## 2013-10-04 ENCOUNTER — Telehealth: Payer: Self-pay | Admitting: Family Medicine

## 2013-10-04 NOTE — Telephone Encounter (Signed)
?   OK to Refill  

## 2013-10-04 NOTE — Telephone Encounter (Signed)
ok 

## 2013-10-04 NOTE — Telephone Encounter (Signed)
Pt is needing a refill on his hydrocodone and plavix and generic of lipitor  Call back number is [phone] and you can leave a message

## 2013-10-05 MED ORDER — ATORVASTATIN CALCIUM 40 MG PO TABS: ORAL_TABLET | ORAL | Status: DC

## 2013-10-05 MED ORDER — CLOPIDOGREL BISULFATE 75 MG PO TABS: 75.0000 mg | ORAL_TABLET | Freq: Every day | ORAL | Status: DC

## 2013-10-05 MED ORDER — HYDROCODONE-ACETAMINOPHEN 5-325 MG PO TABS: ORAL_TABLET | ORAL | Status: DC

## 2013-10-05 NOTE — Telephone Encounter (Signed)
RX printed, left up front and patient aware to pick up  

## 2013-11-06 ENCOUNTER — Telehealth: Payer: Self-pay | Admitting: Family Medicine

## 2013-11-06 NOTE — Telephone Encounter (Signed)
Pt is needing a refill on his hydrocodone Call back number is [phone]

## 2013-11-06 NOTE — Telephone Encounter (Signed)
?   OK to Refill for 3 months 

## 2013-11-06 NOTE — Telephone Encounter (Signed)
ok 

## 2013-11-06 NOTE — Telephone Encounter (Signed)
Pt is needing hydrocodone refilled  Call back number is [phone]

## 2013-11-07 MED ORDER — HYDROCODONE-ACETAMINOPHEN 5-325 MG PO TABS: 1.0000 | ORAL_TABLET | Freq: Three times a day (TID) | ORAL | Status: DC

## 2013-11-07 MED ORDER — HYDROCODONE-ACETAMINOPHEN 5-325 MG PO TABS: ORAL_TABLET | ORAL | Status: DC

## 2013-11-07 NOTE — Telephone Encounter (Signed)
LMTRC

## 2013-11-07 NOTE — Telephone Encounter (Signed)
RX printed X 3 months, left up front and patient aware to pick up 

## 2013-11-21 ENCOUNTER — Telehealth: Payer: Self-pay | Admitting: Family Medicine

## 2013-11-21 MED ORDER — PREDNISONE 10 MG PO TABS: ORAL_TABLET | ORAL | Status: DC

## 2013-11-21 NOTE — Telephone Encounter (Signed)
..  Patient aware per vm 

## 2013-11-21 NOTE — Telephone Encounter (Signed)
Pt is having a gout flare and would like something called in if possible? He usually tries to tough it out without any medication but it is going on 4 days now and it is getting worse.

## 2013-11-21 NOTE — Telephone Encounter (Signed)
Start prednisone taper, due to his kidney function If not improving come in for appt Medication sent

## 2013-12-05 ENCOUNTER — Encounter: Payer: Self-pay | Admitting: Family Medicine

## 2013-12-05 ENCOUNTER — Ambulatory Visit (INDEPENDENT_AMBULATORY_CARE_PROVIDER_SITE_OTHER): Payer: Medicare HMO | Admitting: Family Medicine

## 2013-12-05 VITALS — BP 110/74 | HR 92 | Temp 99.3°F | Resp 18 | Ht 73.0 in | Wt 189.0 lb

## 2013-12-05 DIAGNOSIS — M109 Gout, unspecified: Secondary | ICD-10-CM

## 2013-12-05 LAB — CBC WITH DIFFERENTIAL/PLATELET
Basophils Absolute: 0 10*3/uL (ref 0.0–0.1)
Basophils Relative: 0 % (ref 0–1)
Eosinophils Absolute: 0.1 10*3/uL (ref 0.0–0.7)
Eosinophils Relative: 0 % (ref 0–5)
HEMATOCRIT: 46.1 % (ref 39.0–52.0)
Hemoglobin: 16.4 g/dL (ref 13.0–17.0)
Lymphocytes Relative: 13 % (ref 12–46)
Lymphs Abs: 1.8 10*3/uL (ref 0.7–4.0)
MCH: 31.1 pg (ref 26.0–34.0)
MCHC: 35.6 g/dL (ref 30.0–36.0)
MCV: 87.5 fL (ref 78.0–100.0)
MONO ABS: 0.9 10*3/uL (ref 0.1–1.0)
Monocytes Relative: 6 % (ref 3–12)
NEUTROS ABS: 11.7 10*3/uL — AB (ref 1.7–7.7)
Neutrophils Relative %: 81 % — ABNORMAL HIGH (ref 43–77)
Platelets: 297 10*3/uL (ref 150–400)
RBC: 5.27 MIL/uL (ref 4.22–5.81)
RDW: 13.4 % (ref 11.5–15.5)
WBC: 14.5 10*3/uL — ABNORMAL HIGH (ref 4.0–10.5)

## 2013-12-05 MED ORDER — METHYLPREDNISOLONE ACETATE 40 MG/ML IJ SUSP
80.0000 mg | Freq: Once | INTRAMUSCULAR | Status: AC
  Administered 2013-12-05: 80 mg via INTRAMUSCULAR

## 2013-12-05 MED ORDER — PREDNISONE 20 MG PO TABS: ORAL_TABLET | ORAL | Status: DC

## 2013-12-05 NOTE — Progress Notes (Signed)
Subjective:    Patient ID: Brandon Buck, male    DOB: December 09, 1960, 53 y.o.   MRN: [id number]  HPI Patient has a history of chronic kidney disease and gout. Recently he has been getting frequent gout attacks almost every month. His most recent gout attack has lasted 3 weeks. He has been on an extremely painful right first MTP joint. It is erythematous and warm to the touch. It hurts to even wear socks. It hurts to grab. He denies any injury. He is here today for treatment of this pain. He denies any recent injection in that joint or penetrating trauma. He has no history of septic arthritis or risk  factors for septic arthritis. Past Medical History  Diagnosis Date  . Heart disease   . Kidney disease   . High blood pressure   . Gout    Current Outpatient Prescriptions on File Prior to Visit  Medication Sig Dispense Refill  . aspirin 81 MG tablet Take 81 mg by mouth daily.      Marland Kitchen atorvastatin (LIPITOR) 40 MG tablet TAKE 1 TABLET BY MOUTH ONCE DAILY AT BEDTIME.  30 tablet  2  . clopidogrel (PLAVIX) 75 MG tablet Take 1 tablet (75 mg total) by mouth daily.  30 tablet  5  . diltiazem (CARDIZEM CD) 240 MG 24 hr capsule Take 240 mg by mouth daily.      Marland Kitchen HYDROcodone-acetaminophen (NORCO/VICODIN) 5-325 MG per tablet Take 1 tablet by mouth 3 (three) times daily. DO NOT FILL UNTIL 01/05/14  90 tablet  0  . metoprolol (LOPRESSOR) 50 MG tablet 25mg  BID      . nitroGLYCERIN (NITROSTAT) 0.4 MG SL tablet Place 0.4 mg under the tongue every 5 (five) minutes as needed.      . pantoprazole (PROTONIX) 40 MG tablet Take 1 tablet (40 mg total) by mouth 2 (two) times daily.  60 tablet  5   No current facility-administered medications on file prior to visit.   No Known Allergies History   Social History  . Marital Status: Married    Spouse Name: N/A    Number of Children: N/A  . Years of Education: N/A   Occupational History  . Not on file.   Social History Main Topics  . Smoking status: Former  Research scientist (life sciences)  . Smokeless tobacco: Not on file  . Alcohol Use: No  . Drug Use: No  . Sexual Activity: Not on file   Other Topics Concern  . Not on file   Social History Narrative  . No narrative on file      Review of Systems  All other systems reviewed and are negative.       Objective:   Physical Exam  Vitals reviewed. Cardiovascular: Normal rate, regular rhythm and normal heart sounds.   No murmur heard. Pulmonary/Chest: Effort normal and breath sounds normal. No respiratory distress. He has no wheezes. He has no rales.  Musculoskeletal: He exhibits tenderness.  Skin: Skin is warm. There is erythema.   right first MTP joint is erythematous red swollen and tender to the touch the        Assessment & Plan:  1. Gout attack I will give the patient Depo-Medrol 80 mg IM x1 now. I then put him on a prolonged prednisone taper. I will see the patient back in 2 weeks. Once he is asymptomatic, I will likely start the patient on allopurinol 200 mg by mouth daily to try to drive his uric acid level less  than 6. The initiating allopurinol as instructed patient on colchicine 0.6 mg by mouth daily as a preventative until his uric acid level has stabilized. Recheck in 2 weeks. - predniSONE (DELTASONE) 20 MG tablet; 3 tabs poqday day 1-3, 2 tabs poqday day 4-6, 1 tab poqday 7-9  Dispense: 18 tablet; Refill: 0 - COMPLETE METABOLIC PANEL WITH GFR - CBC with Differential - Uric acid - methylPREDNISolone acetate (DEPO-MEDROL) injection 80 mg; Inject 2 mLs (80 mg total) into the muscle once.

## 2013-12-06 LAB — COMPLETE METABOLIC PANEL WITH GFR
ALBUMIN: 4.8 g/dL (ref 3.5–5.2)
ALT: 19 U/L (ref 0–53)
AST: 18 U/L (ref 0–37)
Alkaline Phosphatase: 83 U/L (ref 39–117)
BUN: 15 mg/dL (ref 6–23)
CALCIUM: 10.4 mg/dL (ref 8.4–10.5)
CHLORIDE: 102 meq/L (ref 96–112)
CO2: 28 meq/L (ref 19–32)
Creat: 1.88 mg/dL — ABNORMAL HIGH (ref 0.50–1.35)
GFR, EST AFRICAN AMERICAN: 46 mL/min — AB
GFR, EST NON AFRICAN AMERICAN: 40 mL/min — AB
GLUCOSE: 81 mg/dL (ref 70–99)
POTASSIUM: 5.3 meq/L (ref 3.5–5.3)
SODIUM: 138 meq/L (ref 135–145)
TOTAL PROTEIN: 7.8 g/dL (ref 6.0–8.3)
Total Bilirubin: 0.8 mg/dL (ref 0.2–1.2)

## 2013-12-06 LAB — URIC ACID: Uric Acid, Serum: 8.1 mg/dL — ABNORMAL HIGH (ref 4.0–7.8)

## 2013-12-20 ENCOUNTER — Ambulatory Visit: Payer: Medicare HMO | Admitting: Family Medicine

## 2013-12-25 ENCOUNTER — Telehealth: Payer: Self-pay | Admitting: *Deleted

## 2013-12-25 DIAGNOSIS — M109 Gout, unspecified: Secondary | ICD-10-CM

## 2013-12-25 MED ORDER — PREDNISONE 20 MG PO TABS: ORAL_TABLET | ORAL | Status: DC

## 2013-12-25 NOTE — Telephone Encounter (Signed)
Pt called stating was put on prednisone last ov for GOUT and then given a shot says he now has a flare up and has appt Friday but wants something to called in until his appt on Friday, says its so bad that by Friday he will not be able to walk.

## 2013-12-25 NOTE — Telephone Encounter (Signed)
Prednisone taper pack

## 2013-12-25 NOTE — Telephone Encounter (Signed)
Meds refilled, pt aware.

## 2013-12-28 ENCOUNTER — Ambulatory Visit (INDEPENDENT_AMBULATORY_CARE_PROVIDER_SITE_OTHER): Payer: Medicare HMO | Admitting: Family Medicine

## 2013-12-28 ENCOUNTER — Encounter: Payer: Self-pay | Admitting: Family Medicine

## 2013-12-28 VITALS — BP 124/62 | HR 80 | Temp 98.1°F | Resp 16 | Ht 72.0 in | Wt 193.0 lb

## 2013-12-28 DIAGNOSIS — M109 Gout, unspecified: Secondary | ICD-10-CM

## 2013-12-28 MED ORDER — ALLOPURINOL 100 MG PO TABS: 200.0000 mg | ORAL_TABLET | Freq: Every day | ORAL | Status: DC

## 2013-12-28 MED ORDER — COLCHICINE 0.6 MG PO TABS: 0.6000 mg | ORAL_TABLET | Freq: Every day | ORAL | Status: DC

## 2013-12-28 MED ORDER — INDOMETHACIN 25 MG PO CAPS: 25.0000 mg | ORAL_CAPSULE | Freq: Every day | ORAL | Status: DC

## 2013-12-28 NOTE — Progress Notes (Signed)
Subjective:    Patient ID: Brandon Buck, male    DOB: April 16, 1961, 53 y.o.   MRN: [id number]  HPI 12/05/13 Patient has a history of chronic kidney disease and gout. Recently he has been getting frequent gout attacks almost every month. His most recent gout attack has lasted 3 weeks. He has been on an extremely painful right first MTP joint. It is erythematous and warm to the touch. It hurts to even wear socks. It hurts to grab. He denies any injury. He is here today for treatment of this pain. He denies any recent injection in that joint or penetrating trauma. He has no history of septic arthritis or risk  factors for septic arthritis.  At that time, my plan was: 1. Gout attack I will give the patient Depo-Medrol 80 mg IM x1 now. I then put him on a prolonged prednisone taper. I will see the patient back in 2 weeks. Once he is asymptomatic, I will likely start the patient on allopurinol 200 mg by mouth daily to try to drive his uric acid level less than 6. The initiating allopurinol as instructed patient on colchicine 0.6 mg by mouth daily as a preventative until his uric acid level has stabilized. Recheck in 2 weeks. - predniSONE (DELTASONE) 20 MG tablet; 3 tabs poqday day 1-3, 2 tabs poqday day 4-6, 1 tab poqday 7-9  Dispense: 18 tablet; Refill: 0 - COMPLETE METABOLIC PANEL WITH GFR - CBC with Differential - Uric acid - methylPREDNISolone acetate (DEPO-MEDROL) injection 80 mg; Inject 2 mLs (80 mg total) into the muscle once.  12/28/13 Patient is here today for followup.  Office Visit on 12/05/2013  Component Date Value Ref Range Status  . Sodium 12/05/2013 138  135 - 145 mEq/L Final  . Potassium 12/05/2013 5.3  3.5 - 5.3 mEq/L Final  . Chloride 12/05/2013 102  96 - 112 mEq/L Final  . CO2 12/05/2013 28  19 - 32 mEq/L Final  . Glucose, Bld 12/05/2013 81  70 - 99 mg/dL Final  . BUN 12/05/2013 15  6 - 23 mg/dL Final  . Creat 12/05/2013 1.88* 0.50 - 1.35 mg/dL Final  . Total Bilirubin  12/05/2013 0.8  0.2 - 1.2 mg/dL Final   ** Please note change in reference range(s). **  . Alkaline Phosphatase 12/05/2013 83  39 - 117 U/L Final  . AST 12/05/2013 18  0 - 37 U/L Final  . ALT 12/05/2013 19  0 - 53 U/L Final  . Total Protein 12/05/2013 7.8  6.0 - 8.3 g/dL Final  . Albumin 12/05/2013 4.8  3.5 - 5.2 g/dL Final  . Calcium 12/05/2013 10.4  8.4 - 10.5 mg/dL Final  . GFR, Est African American 12/05/2013 46*  Final  . GFR, Est Non African American 12/05/2013 40*  Final   Comment:                            The estimated GFR is a calculation valid for adults (>=49 years old)                          that uses the CKD-EPI algorithm to adjust for age and sex. It is                            not to be used for children, pregnant women, hospitalized patients,  patients on dialysis, or with rapidly changing kidney function.                          According to the NKDEP, eGFR >89 is normal, 60-89 shows mild                          impairment, 30-59 shows moderate impairment, 15-29 shows severe                          impairment and <15 is ESRD.                             . WBC 12/05/2013 14.5* 4.0 - 10.5 K/uL Final  . RBC 12/05/2013 5.27  4.22 - 5.81 MIL/uL Final  . Hemoglobin 12/05/2013 16.4  13.0 - 17.0 g/dL Final  . HCT 12/05/2013 46.1  39.0 - 52.0 % Final  . MCV 12/05/2013 87.5  78.0 - 100.0 fL Final  . MCH 12/05/2013 31.1  26.0 - 34.0 pg Final  . MCHC 12/05/2013 35.6  30.0 - 36.0 g/dL Final  . RDW 12/05/2013 13.4  11.5 - 15.5 % Final  . Platelets 12/05/2013 297  150 - 400 K/uL Final  . Neutrophils Relative % 12/05/2013 81* 43 - 77 % Final  . Neutro Abs 12/05/2013 11.7* 1.7 - 7.7 K/uL Final  . Lymphocytes Relative 12/05/2013 13  12 - 46 % Final  . Lymphs Abs 12/05/2013 1.8  0.7 - 4.0 K/uL Final  . Monocytes Relative 12/05/2013 6  3 - 12 % Final  . Monocytes Absolute 12/05/2013 0.9  0.1 - 1.0 K/uL Final  . Eosinophils Relative 12/05/2013 0  0 -  5 % Final  . Eosinophils Absolute 12/05/2013 0.1  0.0 - 0.7 K/uL Final  . Basophils Relative 12/05/2013 0  0 - 1 % Final  . Basophils Absolute 12/05/2013 0.0  0.0 - 0.1 K/uL Final  . Smear Review 12/05/2013 Criteria for review not met   Final  . Uric Acid, Serum 12/05/2013 8.1* 4.0 - 7.8 mg/dL Final   His uric acid level was found to be elevated at 8.1. The patient's symptoms rapidly improved on prednisone. He is interesting in starting a preventative medicine to help prevent gout attacks in the future   Past Medical History  Diagnosis Date  . Heart disease   . Kidney disease   . High blood pressure   . Gout    Current Outpatient Prescriptions on File Prior to Visit  Medication Sig Dispense Refill  . aspirin 81 MG tablet Take 81 mg by mouth daily.      Marland Kitchen atorvastatin (LIPITOR) 40 MG tablet TAKE 1 TABLET BY MOUTH ONCE DAILY AT BEDTIME.  30 tablet  2  . clopidogrel (PLAVIX) 75 MG tablet Take 1 tablet (75 mg total) by mouth daily.  30 tablet  5  . diltiazem (CARDIZEM CD) 240 MG 24 hr capsule Take 240 mg by mouth daily.      Marland Kitchen HYDROcodone-acetaminophen (NORCO/VICODIN) 5-325 MG per tablet Take 1 tablet by mouth 3 (three) times daily. DO NOT FILL UNTIL 01/05/14  90 tablet  0  . metoprolol (LOPRESSOR) 50 MG tablet 74m BID      . nitroGLYCERIN (NITROSTAT) 0.4 MG SL tablet Place 0.4 mg under the tongue every 5 (five) minutes as needed.      .Marland Kitchen  pantoprazole (PROTONIX) 40 MG tablet Take 1 tablet (40 mg total) by mouth 2 (two) times daily.  60 tablet  5   No current facility-administered medications on file prior to visit.   No Known Allergies History   Social History  . Marital Status: Married    Spouse Name: N/A    Number of Children: N/A  . Years of Education: N/A   Occupational History  . Not on file.   Social History Main Topics  . Smoking status: Former Research scientist (life sciences)  . Smokeless tobacco: Not on file  . Alcohol Use: No  . Drug Use: No  . Sexual Activity: Not on file   Other Topics  Concern  . Not on file   Social History Narrative  . No narrative on file      Review of Systems  All other systems reviewed and are negative.       Objective:   Physical Exam  Vitals reviewed. Cardiovascular: Normal rate, regular rhythm and normal heart sounds.   No murmur heard. Pulmonary/Chest: Effort normal and breath sounds normal. No respiratory distress. He has no wheezes. He has no rales.  Skin: Skin is warm.          Assessment & Plan:  1. Gout Begin allopurinol 200 mg by mouth daily. I will also start the patient on colchicine 0.6 mg by mouth daily to prevent gout attack probably uric acid levels or changing. After 4-6 weeks the patient can discontinue colchicine and continue on allopurinol alone. - colchicine 0.6 MG tablet; Take 1 tablet (0.6 mg total) by mouth daily.  Dispense: 30 tablet; Refill: 1 - allopurinol (ZYLOPRIM) 100 MG tablet; Take 2 tablets (200 mg total) by mouth daily.  Dispense: 60 tablet; Refill: 5

## 2013-12-28 NOTE — Addendum Note (Signed)
Addended by: Shary Decamp B on: 12/28/2013 04:00 PM   Modules accepted: Orders, Medications

## 2014-01-17 ENCOUNTER — Telehealth: Payer: Self-pay | Admitting: *Deleted

## 2014-01-17 NOTE — Telephone Encounter (Signed)
Received letter requesting PA for Indomethacin.   PA submitted.

## 2014-01-17 NOTE — Telephone Encounter (Signed)
Approved 10/25/13 - 10/24/14 - Faxed to pharmacy

## 2014-01-28 ENCOUNTER — Other Ambulatory Visit: Payer: Self-pay | Admitting: Family Medicine

## 2014-01-29 NOTE — Telephone Encounter (Signed)
Medication refilled per protocol. 

## 2014-02-05 ENCOUNTER — Telehealth: Payer: Self-pay | Admitting: Family Medicine

## 2014-02-05 MED ORDER — HYDROCODONE-ACETAMINOPHEN 5-325 MG PO TABS: 1.0000 | ORAL_TABLET | Freq: Three times a day (TID) | ORAL | Status: DC | PRN

## 2014-02-05 NOTE — Telephone Encounter (Signed)
ok 

## 2014-02-05 NOTE — Telephone Encounter (Signed)
Message copied by Alyson Locket on Tue Feb 05, 2014 12:49 PM ------      Message from: Devoria Glassing      Created: Tue Feb 05, 2014 11:33 AM       Patient is calling in for his Vicodin [phone]  ------

## 2014-02-05 NOTE — Telephone Encounter (Signed)
rx printed for provider signature.  Left pt mess RX ready for pick up

## 2014-02-05 NOTE — Telephone Encounter (Signed)
?   OK to Refill  

## 2014-03-05 ENCOUNTER — Telehealth: Payer: Self-pay | Admitting: Family Medicine

## 2014-03-05 MED ORDER — HYDROCODONE-ACETAMINOPHEN 5-325 MG PO TABS: 1.0000 | ORAL_TABLET | Freq: Three times a day (TID) | ORAL | Status: DC | PRN

## 2014-03-05 NOTE — Telephone Encounter (Signed)
?   OK to Refill  

## 2014-03-05 NOTE — Telephone Encounter (Signed)
ok 

## 2014-03-05 NOTE — Telephone Encounter (Signed)
RX printed, left up front and patient aware to pick up per vm 

## 2014-03-05 NOTE — Telephone Encounter (Signed)
[phone]  Pt is needing a refill on HYDROcodone-acetaminophen (NORCO/VICODIN) 5-325 MG per tablet

## 2014-03-12 ENCOUNTER — Other Ambulatory Visit: Payer: Self-pay | Admitting: Family Medicine

## 2014-03-12 NOTE — Telephone Encounter (Signed)
Refill appropriate and filled per protocol. 

## 2014-04-03 ENCOUNTER — Telehealth: Payer: Self-pay | Admitting: Family Medicine

## 2014-04-03 NOTE — Telephone Encounter (Signed)
[phone]  PT is needing a refill on HYDROcodone-acetaminophen (NORCO/VICODIN) 5-325 MG per tablet atorvastatin (LIPITOR) 40 MG tablet

## 2014-04-04 NOTE — Telephone Encounter (Signed)
ok 

## 2014-04-04 NOTE — Telephone Encounter (Signed)
?   OK to Refill  

## 2014-04-05 MED ORDER — ATORVASTATIN CALCIUM 40 MG PO TABS: ORAL_TABLET | ORAL | Status: DC

## 2014-04-05 MED ORDER — HYDROCODONE-ACETAMINOPHEN 5-325 MG PO TABS: 1.0000 | ORAL_TABLET | Freq: Three times a day (TID) | ORAL | Status: DC | PRN

## 2014-04-05 NOTE — Telephone Encounter (Signed)
RX printed, left up front and patient aware to pick up  

## 2014-04-23 ENCOUNTER — Other Ambulatory Visit: Payer: Self-pay | Admitting: Family Medicine

## 2014-04-23 NOTE — Telephone Encounter (Signed)
Refill appropriate and filled per protocol. 

## 2014-05-07 ENCOUNTER — Telehealth: Payer: Self-pay | Admitting: Family Medicine

## 2014-05-07 NOTE — Telephone Encounter (Signed)
?   OK to Refill x 3months 

## 2014-05-07 NOTE — Telephone Encounter (Signed)
ok 

## 2014-05-07 NOTE — Telephone Encounter (Signed)
[phone]   PT is needing a refill on HYDROcodone-acetaminophen (NORCO/VICODIN) 5-325 MG per tablet

## 2014-05-08 MED ORDER — HYDROCODONE-ACETAMINOPHEN 5-325 MG PO TABS: 1.0000 | ORAL_TABLET | Freq: Three times a day (TID) | ORAL | Status: DC | PRN

## 2014-05-08 NOTE — Telephone Encounter (Signed)
RX printed, left up front and patient aware to pick up  

## 2014-06-28 ENCOUNTER — Telehealth: Payer: Self-pay | Admitting: Family Medicine

## 2014-06-28 MED ORDER — CLOPIDOGREL BISULFATE 75 MG PO TABS: 75.0000 mg | ORAL_TABLET | Freq: Every day | ORAL | Status: DC

## 2014-06-28 MED ORDER — ATORVASTATIN CALCIUM 40 MG PO TABS: ORAL_TABLET | ORAL | Status: DC

## 2014-06-28 NOTE — Telephone Encounter (Signed)
Med sent to pharm for 90 day supply 

## 2014-07-18 ENCOUNTER — Other Ambulatory Visit: Payer: Self-pay | Admitting: Family Medicine

## 2014-07-18 ENCOUNTER — Encounter: Payer: Self-pay | Admitting: Family Medicine

## 2014-07-18 NOTE — Telephone Encounter (Signed)
Medication refill for one time only.  Patient needs to be seen.  Letter sent for patient to call and schedule 

## 2014-08-06 ENCOUNTER — Encounter: Payer: Self-pay | Admitting: Family Medicine

## 2014-08-06 ENCOUNTER — Ambulatory Visit (INDEPENDENT_AMBULATORY_CARE_PROVIDER_SITE_OTHER): Payer: Medicare HMO | Admitting: Family Medicine

## 2014-08-06 VITALS — BP 110/64 | HR 69 | Temp 98.2°F | Resp 14 | Ht 72.0 in | Wt 201.0 lb

## 2014-08-06 DIAGNOSIS — R0609 Other forms of dyspnea: Secondary | ICD-10-CM

## 2014-08-06 DIAGNOSIS — Z9861 Coronary angioplasty status: Secondary | ICD-10-CM | POA: Insufficient documentation

## 2014-08-06 DIAGNOSIS — I2510 Atherosclerotic heart disease of native coronary artery without angina pectoris: Secondary | ICD-10-CM

## 2014-08-06 DIAGNOSIS — E785 Hyperlipidemia, unspecified: Secondary | ICD-10-CM

## 2014-08-06 DIAGNOSIS — M1000 Idiopathic gout, unspecified site: Secondary | ICD-10-CM

## 2014-08-06 DIAGNOSIS — Z23 Encounter for immunization: Secondary | ICD-10-CM

## 2014-08-06 DIAGNOSIS — N183 Chronic kidney disease, stage 3 unspecified: Secondary | ICD-10-CM

## 2014-08-06 DIAGNOSIS — I10 Essential (primary) hypertension: Secondary | ICD-10-CM

## 2014-08-06 MED ORDER — CLOPIDOGREL BISULFATE 75 MG PO TABS: 75.0000 mg | ORAL_TABLET | Freq: Every day | ORAL | Status: DC

## 2014-08-06 MED ORDER — HYDROCODONE-ACETAMINOPHEN 5-325 MG PO TABS
1.0000 | ORAL_TABLET | Freq: Three times a day (TID) | ORAL | Status: DC | PRN
Start: 2014-08-06 — End: 2014-09-05

## 2014-08-06 MED ORDER — PANTOPRAZOLE SODIUM 40 MG PO TBEC: 40.0000 mg | DELAYED_RELEASE_TABLET | Freq: Two times a day (BID) | ORAL | Status: DC

## 2014-08-06 NOTE — Progress Notes (Signed)
Subjective:    Patient ID: Brandon Buck, male    DOB: [birth year], 53 y.o.   MRN: [id number]  HPI Patient is a very pleasant 53 year old white male who presents today for his regular medical checkup. He has a history of hypertension, hyperlipidemia, coronary artery disease, and remote tobacco abuse. Patient underwent coronary angioplasty and stent x2. He has not had a stress test in quite some time. He never did followup with his cardiologist as his previous cardiologist have all left or retired.  Last few months he reports increasing dyspnea on exertion. He denies any orthopnea or paroxysmal nocturnal dyspnea. He denies any angina or chest pain. He reports that he become short of breath with minimal activity. He does have a significant smoking history of over 40 pack years. He denies a cough or wheezing. He become short of breath only with activity. His blood pressure today is well controlled at 110/60 were. He denies any myalgias or quadrant pain on atorvastatin. He denies any bleeding or bruising on aspirin and Plavix. He has not had any recent gout flares ever since he began taking allopurinol.  He is overdue for fasting labs.  He also requests refill his pain medication which he takes for chronic hip pain due to aseptic necrosis. Past Medical History  Diagnosis Date  . Heart disease   . Kidney disease   . High blood pressure   . Gout   . History of PTCA 2     2 stents   Past Surgical History  Procedure Laterality Date  . Hernia repair    . Hip surgery    . Kidney removed    . Heart catherization     Current Outpatient Prescriptions on File Prior to Visit  Medication Sig Dispense Refill  . allopurinol (ZYLOPRIM) 100 MG tablet Take 2 tablets (200 mg total) by mouth daily.  60 tablet  5  . aspirin 81 MG tablet Take 81 mg by mouth daily.      Marland Kitchen atorvastatin (LIPITOR) 40 MG tablet TAKE 1 TABLET BY MOUTH ONCE DAILY AT BEDTIME.  90 tablet  0  . diltiazem (CARDIZEM CD) 240 MG 24 hr capsule  TAKE (1) CAPSULE BY MOUTH DAILY.  30 capsule  0  . HYDROcodone-acetaminophen (NORCO/VICODIN) 5-325 MG per tablet Take 1 tablet by mouth 3 (three) times daily as needed for moderate pain.  90 tablet  0  . indomethacin (INDOCIN) 25 MG capsule Take 1 capsule (25 mg total) by mouth daily.  30 capsule  5  . metoprolol (LOPRESSOR) 50 MG tablet TAKE 1/2 TABLET BY MOUTH TWICE A DAY.  30 tablet  3  . nitroGLYCERIN (NITROSTAT) 0.4 MG SL tablet Place 0.4 mg under the tongue every 5 (five) minutes as needed.       No current facility-administered medications on file prior to visit.   No Known Allergies History   Social History  . Marital Status: Married    Spouse Name: N/A    Number of Children: N/A  . Years of Education: N/A   Occupational History  . Not on file.   Social History Main Topics  . Smoking status: Former Research scientist (life sciences)  . Smokeless tobacco: Not on file  . Alcohol Use: No  . Drug Use: No  . Sexual Activity: Not on file   Other Topics Concern  . Not on file   Social History Narrative  . No narrative on file      Review of Systems  All other systems  reviewed and are negative.      Objective:   Physical Exam  Vitals reviewed. Constitutional: He appears well-developed and well-nourished.  Neck: Neck supple. No JVD present.  Cardiovascular: Normal rate, regular rhythm, normal heart sounds and intact distal pulses.   No murmur heard. Pulmonary/Chest: Effort normal and breath sounds normal. No respiratory distress. He has no wheezes. He has no rales.  Abdominal: Soft. Bowel sounds are normal. He exhibits no distension. There is no tenderness. There is no rebound and no guarding.  Musculoskeletal: He exhibits no edema.  Lymphadenopathy:    He has no cervical adenopathy.          Assessment & Plan:  1. Idiopathic gout, unspecified chronicity, unspecified site I will check a uric acid level. Goal uric acid level is less than 6. - Uric acid  2. CKD (chronic kidney  disease), stage III Monitor the patient's creatinine as well as a CBC for signs of anemia due to chronic disease - CBC with Differential  3. Essential hypertension Patient's blood pressures well controlled. On May no change in his medication at this time. - COMPLETE METABOLIC PANEL WITH GFR - Lipid panel  4. HLD (hyperlipidemia) I will have the patient return fasting for fasting lipid panel. Goal LDL cholesterol is less than 70. - COMPLETE METABOLIC PANEL WITH GFR - Lipid panel  5. CAD in native artery Check fasting lipid panel. Continue aspirin and Plavix. Also schedule the patient for an echocardiogram - Lipid panel - 2D Echocardiogram without contrast; Future  6. Dyspnea on exertion EKG obtained today in the office showed sinus bradycardia at 59 beats per minute with normal intervals and normal axis. There is nonspecific ST changes in lead 1 and lead aVL. Otherwise there is no evidence of acute ischemia or infarction.  Spirometry was also obtained to evaluate for possible emphysema.  Patient's FEV1 was 2.33 L which is 57% predicted. Patient's FVC was 3.33 L which was 65% predicted. His FEV1 to FVC ratio was 70% which is borderline for obstruction. Therefore there is some evidence of possible stage II emphysema. However I will obtain an echocardiogram of the heart first rule out congestive heart failure. If the patient's echocardiogram is normal, I will try the patient on spiria to see if I can help his dyspnea on exertion.  If no better at that point, I would recommend a chest x-ray to rule out underlying pathology in the lung. - 2D Echocardiogram without contrast; Future

## 2014-08-06 NOTE — Addendum Note (Signed)
Addended by: Shary Decamp B on: 08/06/2014 11:54 AM   Modules accepted: Orders

## 2014-08-12 ENCOUNTER — Other Ambulatory Visit: Payer: Medicare HMO

## 2014-08-12 ENCOUNTER — Ambulatory Visit (HOSPITAL_COMMUNITY): Payer: Medicare HMO | Attending: Cardiology | Admitting: Cardiology

## 2014-08-12 DIAGNOSIS — R0609 Other forms of dyspnea: Secondary | ICD-10-CM

## 2014-08-12 DIAGNOSIS — I10 Essential (primary) hypertension: Secondary | ICD-10-CM | POA: Diagnosis not present

## 2014-08-12 DIAGNOSIS — I2510 Atherosclerotic heart disease of native coronary artery without angina pectoris: Secondary | ICD-10-CM | POA: Diagnosis present

## 2014-08-12 DIAGNOSIS — Z87891 Personal history of nicotine dependence: Secondary | ICD-10-CM | POA: Diagnosis not present

## 2014-08-12 DIAGNOSIS — E785 Hyperlipidemia, unspecified: Secondary | ICD-10-CM | POA: Insufficient documentation

## 2014-08-12 LAB — COMPLETE METABOLIC PANEL WITH GFR
ALT: 18 U/L (ref 0–53)
AST: 19 U/L (ref 0–37)
Albumin: 4.4 g/dL (ref 3.5–5.2)
Alkaline Phosphatase: 80 U/L (ref 39–117)
BUN: 15 mg/dL (ref 6–23)
CO2: 27 mEq/L (ref 19–32)
Calcium: 9.8 mg/dL (ref 8.4–10.5)
Chloride: 104 mEq/L (ref 96–112)
Creat: 1.49 mg/dL — ABNORMAL HIGH (ref 0.50–1.35)
GFR, Est African American: 61 mL/min
GFR, Est Non African American: 52 mL/min — ABNORMAL LOW
Glucose, Bld: 104 mg/dL — ABNORMAL HIGH (ref 70–99)
Potassium: 5.6 mEq/L — ABNORMAL HIGH (ref 3.5–5.3)
Sodium: 139 mEq/L (ref 135–145)
Total Bilirubin: 0.9 mg/dL (ref 0.2–1.2)
Total Protein: 7 g/dL (ref 6.0–8.3)

## 2014-08-12 LAB — CBC WITH DIFFERENTIAL/PLATELET
Basophils Absolute: 0.1 10*3/uL (ref 0.0–0.1)
Basophils Relative: 1 % (ref 0–1)
EOS ABS: 0.1 10*3/uL (ref 0.0–0.7)
EOS PCT: 2 % (ref 0–5)
HCT: 43.2 % (ref 39.0–52.0)
HEMOGLOBIN: 14.9 g/dL (ref 13.0–17.0)
LYMPHS ABS: 1.6 10*3/uL (ref 0.7–4.0)
Lymphocytes Relative: 23 % (ref 12–46)
MCH: 30.7 pg (ref 26.0–34.0)
MCHC: 34.5 g/dL (ref 30.0–36.0)
MCV: 89.1 fL (ref 78.0–100.0)
MONO ABS: 0.6 10*3/uL (ref 0.1–1.0)
MONOS PCT: 8 % (ref 3–12)
Neutro Abs: 4.7 10*3/uL (ref 1.7–7.7)
Neutrophils Relative %: 66 % (ref 43–77)
PLATELETS: 238 10*3/uL (ref 150–400)
RBC: 4.85 MIL/uL (ref 4.22–5.81)
RDW: 13.4 % (ref 11.5–15.5)
WBC: 7.1 10*3/uL (ref 4.0–10.5)

## 2014-08-12 LAB — LIPID PANEL
Cholesterol: 160 mg/dL (ref 0–200)
HDL: 52 mg/dL (ref >39)
LDL Cholesterol: 83 mg/dL (ref 0–99)
Total CHOL/HDL Ratio: 3.1 Ratio
Triglycerides: 124 mg/dL (ref <150)
VLDL: 25 mg/dL (ref 0–40)

## 2014-08-12 LAB — URIC ACID: Uric Acid, Serum: 7.7 mg/dL (ref 4.0–7.8)

## 2014-08-12 NOTE — Progress Notes (Signed)
Echo performed. 

## 2014-08-14 ENCOUNTER — Other Ambulatory Visit: Payer: Self-pay | Admitting: Family Medicine

## 2014-08-14 MED ORDER — TIOTROPIUM BROMIDE MONOHYDRATE 18 MCG IN CAPS: 18.0000 ug | ORAL_CAPSULE | Freq: Every day | RESPIRATORY_TRACT | Status: DC

## 2014-08-27 ENCOUNTER — Encounter: Payer: Self-pay | Admitting: Family Medicine

## 2014-08-27 ENCOUNTER — Ambulatory Visit (INDEPENDENT_AMBULATORY_CARE_PROVIDER_SITE_OTHER): Payer: Medicare HMO | Admitting: Family Medicine

## 2014-08-27 VITALS — BP 132/78 | HR 80 | Temp 98.2°F | Resp 18 | Ht 72.0 in | Wt 199.0 lb

## 2014-08-27 DIAGNOSIS — F411 Generalized anxiety disorder: Secondary | ICD-10-CM

## 2014-08-27 DIAGNOSIS — R0609 Other forms of dyspnea: Secondary | ICD-10-CM

## 2014-08-27 LAB — BASIC METABOLIC PANEL
BUN: 18 mg/dL (ref 6–23)
CO2: 27 meq/L (ref 19–32)
Calcium: 9.3 mg/dL (ref 8.4–10.5)
Chloride: 106 mEq/L (ref 96–112)
Creat: 1.85 mg/dL — ABNORMAL HIGH (ref 0.50–1.35)
GLUCOSE: 83 mg/dL (ref 70–99)
Potassium: 5.1 mEq/L (ref 3.5–5.3)
SODIUM: 140 meq/L (ref 135–145)

## 2014-08-27 MED ORDER — ALPRAZOLAM 0.5 MG PO TABS: 0.5000 mg | ORAL_TABLET | Freq: Three times a day (TID) | ORAL | Status: DC | PRN

## 2014-08-27 NOTE — Progress Notes (Signed)
Subjective:    Patient ID: Brandon Buck, male    DOB: 19-Jul-1961, 53 y.o.   MRN: [id number]  HPI 08/06/14 Patient is a very pleasant 53 year old white male who presents today for his regular medical checkup. He has a history of hypertension, hyperlipidemia, coronary artery disease, and remote tobacco abuse. Patient underwent coronary angioplasty and stent x2. He has not had a stress test in quite some time. He never did followup with his cardiologist as his previous cardiologist have all left or retired.  Last few months he reports increasing dyspnea on exertion. He denies any orthopnea or paroxysmal nocturnal dyspnea. He denies any angina or chest pain. He reports that he become short of breath with minimal activity. He does have a significant smoking history of over 40 pack years. He denies a cough or wheezing. He become short of breath only with activity. His blood pressure today is well controlled at 110/60 were. He denies any myalgias or quadrant pain on atorvastatin. He denies any bleeding or bruising on aspirin and Plavix. He has not had any recent gout flares ever since he began taking allopurinol.  He is overdue for fasting labs.  He also requests refill his pain medication which he takes for chronic hip pain due to aseptic necrosis.  At that time, my plan was: 1. Idiopathic gout, unspecified chronicity, unspecified site I will check a uric acid level. Goal uric acid level is less than 6. - Uric acid  2. CKD (chronic kidney disease), stage III Monitor the patient's creatinine as well as a CBC for signs of anemia due to chronic disease - CBC with Differential  3. Essential hypertension Patient's blood pressures well controlled. On May no change in his medication at this time. - COMPLETE METABOLIC PANEL WITH GFR - Lipid panel  4. HLD (hyperlipidemia) I will have the patient return fasting for fasting lipid panel. Goal LDL cholesterol is less than 70. - COMPLETE METABOLIC PANEL WITH  GFR - Lipid panel  5. CAD in native artery Check fasting lipid panel. Continue aspirin and Plavix. Also schedule the patient for an echocardiogram - Lipid panel - 2D Echocardiogram without contrast; Future  6. Dyspnea on exertion EKG obtained today in the office showed sinus bradycardia at 59 beats per minute with normal intervals and normal axis. There is nonspecific ST changes in lead 1 and lead aVL. Otherwise there is no evidence of acute ischemia or infarction.  Spirometry was also obtained to evaluate for possible emphysema.  Patient's FEV1 was 2.33 L which is 57% predicted. Patient's FVC was 3.33 L which was 65% predicted. His FEV1 to FVC ratio was 70% which is borderline for obstruction. Therefore there is some evidence of possible stage II emphysema. However I will obtain an echocardiogram of the heart first rule out congestive heart failure. If the patient's echocardiogram is normal, I will try the patient on spiria to see if I can help his dyspnea on exertion.  If no better at that point, I would recommend a chest x-ray to rule out underlying pathology in the lung. - 2D Echocardiogram without contrast; Future 08/27/14 Patient's echocardiogram revealed no evidence of congestive heart failure, valve abnormalities, wall motion abnormalities, or diastolic dysfunction. Patient has used believe there for approximately 2 weeks. He reports very minimal improvement in his dyspnea on exertion although he is able to do slightly better with activity. Past Medical History  Diagnosis Date  . Heart disease   . Kidney disease   . High blood  pressure   . Gout   . History of PTCA 2     2 stents   Past Surgical History  Procedure Laterality Date  . Hernia repair    . Hip surgery    . Kidney removed    . Heart catherization     Current Outpatient Prescriptions on File Prior to Visit  Medication Sig Dispense Refill  . allopurinol (ZYLOPRIM) 100 MG tablet Take 2 tablets (200 mg total) by mouth  daily. 60 tablet 5  . aspirin 81 MG tablet Take 81 mg by mouth daily.    Marland Kitchen atorvastatin (LIPITOR) 40 MG tablet TAKE 1 TABLET BY MOUTH ONCE DAILY AT BEDTIME. 90 tablet 0  . clopidogrel (PLAVIX) 75 MG tablet Take 1 tablet (75 mg total) by mouth daily. 90 tablet 3  . diltiazem (CARDIZEM CD) 240 MG 24 hr capsule TAKE (1) CAPSULE BY MOUTH DAILY. 30 capsule 0  . HYDROcodone-acetaminophen (NORCO/VICODIN) 5-325 MG per tablet Take 1 tablet by mouth 3 (three) times daily as needed for moderate pain. 90 tablet 0  . indomethacin (INDOCIN) 25 MG capsule Take 1 capsule (25 mg total) by mouth daily. 30 capsule 5  . metoprolol (LOPRESSOR) 50 MG tablet TAKE 1/2 TABLET BY MOUTH TWICE A DAY. 30 tablet 3  . nitroGLYCERIN (NITROSTAT) 0.4 MG SL tablet Place 0.4 mg under the tongue every 5 (five) minutes as needed.    . pantoprazole (PROTONIX) 40 MG tablet Take 1 tablet (40 mg total) by mouth 2 (two) times daily. 60 tablet 5  . tiotropium (SPIRIVA HANDIHALER) 18 MCG inhalation capsule Place 1 capsule (18 mcg total) into inhaler and inhale daily. 30 capsule 12   No current facility-administered medications on file prior to visit.   No Known Allergies History   Social History  . Marital Status: Married    Spouse Name: N/A    Number of Children: N/A  . Years of Education: N/A   Occupational History  . Not on file.   Social History Main Topics  . Smoking status: Former Research scientist (life sciences)  . Smokeless tobacco: Not on file  . Alcohol Use: No  . Drug Use: No  . Sexual Activity: Not on file   Other Topics Concern  . Not on file   Social History Narrative      Review of Systems  All other systems reviewed and are negative.      Objective:   Physical Exam  Constitutional: He appears well-developed and well-nourished.  Neck: No JVD present.  Cardiovascular: Normal rate, regular rhythm and normal heart sounds.   No murmur heard. Pulmonary/Chest: Effort normal and breath sounds normal. No respiratory distress.  He has no wheezes. He has no rales.  Musculoskeletal: He exhibits no edema.  Lymphadenopathy:    He has no cervical adenopathy.  Vitals reviewed.         Assessment & Plan:   Dyspnea on exertion - Plan: DG Chest 2 View, Basic Metabolic Panel  Anxiety state - Plan: ALPRAZolam (XANAX) 0.5 MG tablet  I will schedule the patient for a chest x-ray to rule out pulmonary pathology such as lung cancer. I have asked the patient to discontinue Spiriva. I have replaced it with stiolto 2 inh qday.  Recheck in 2 weeks. If the chest x-ray was normal and the breathing is not improving on the new medication, I will consult car less than exquisitely could use sparingly for anxiety. I gave the patient Xanax 0.5 mg by mouth every 8 hours when necessary  anxiety. I gave him 30 tablets with 0 refills.

## 2014-09-03 ENCOUNTER — Telehealth: Payer: Self-pay | Admitting: Family Medicine

## 2014-09-03 NOTE — Telephone Encounter (Signed)
[place] with both.

## 2014-09-03 NOTE — Telephone Encounter (Signed)
Requesting refill on hydrocodone - ? OK to Refill (also states that the stiolto is working very well however insurance on it is too high and would like samples.)

## 2014-09-05 ENCOUNTER — Ambulatory Visit (HOSPITAL_COMMUNITY)
Admission: RE | Admit: 2014-09-05 | Discharge: 2014-09-05 | Disposition: A | Discharge status: Home / Self Care | Payer: Medicare HMO | Source: Ambulatory Visit | Attending: Family Medicine | Admitting: Family Medicine

## 2014-09-05 DIAGNOSIS — R0602 Shortness of breath: Secondary | ICD-10-CM | POA: Diagnosis present

## 2014-09-05 DIAGNOSIS — R0609 Other forms of dyspnea: Secondary | ICD-10-CM

## 2014-09-05 MED ORDER — HYDROCODONE-ACETAMINOPHEN 5-325 MG PO TABS: 1.0000 | ORAL_TABLET | Freq: Three times a day (TID) | ORAL | Status: DC | PRN

## 2014-09-05 NOTE — Telephone Encounter (Signed)
RX printed, left up front and patient aware to pick up and samples left up front for pt

## 2014-10-04 ENCOUNTER — Telehealth: Payer: Self-pay | Admitting: Family Medicine

## 2014-10-04 DIAGNOSIS — F411 Generalized anxiety disorder: Secondary | ICD-10-CM

## 2014-10-04 MED ORDER — HYDROCODONE-ACETAMINOPHEN 5-325 MG PO TABS: 1.0000 | ORAL_TABLET | Freq: Three times a day (TID) | ORAL | Status: DC | PRN

## 2014-10-04 MED ORDER — ALPRAZOLAM 0.5 MG PO TABS: 0.5000 mg | ORAL_TABLET | Freq: Three times a day (TID) | ORAL | Status: DC | PRN

## 2014-10-04 NOTE — Telephone Encounter (Signed)
ok 

## 2014-10-04 NOTE — Telephone Encounter (Signed)
[phone]  Pt is needing refills on HYDROcodone-acetaminophen (NORCO/VICODIN) 5-325 MG per tablet and ALPRAZolam (XANAX) 0.5 MG tablet

## 2014-10-04 NOTE — Telephone Encounter (Signed)
rx printed for provider signature.  Left pt message ready for pick up

## 2014-10-04 NOTE — Telephone Encounter (Signed)
Xanax 11/3 #30.  Norco 11/12 #90   LOV 11/03  OK refill?

## 2014-11-06 ENCOUNTER — Telehealth: Payer: Self-pay | Admitting: Family Medicine

## 2014-11-06 DIAGNOSIS — F411 Generalized anxiety disorder: Secondary | ICD-10-CM

## 2014-11-06 NOTE — Telephone Encounter (Signed)
[phone] Pt is needing a refill on HYDROcodone-acetaminophen (NORCO/VICODIN) 5-325 MG per tablet and  ALPRAZolam (XANAX) 0.5 MG tablet

## 2014-11-06 NOTE — Telephone Encounter (Signed)
LRF both 10/04/14  LOV 08/27/14  OK refill?

## 2014-11-07 MED ORDER — ALPRAZOLAM 0.5 MG PO TABS: 0.5000 mg | ORAL_TABLET | Freq: Three times a day (TID) | ORAL | Status: DC | PRN

## 2014-11-07 MED ORDER — HYDROCODONE-ACETAMINOPHEN 5-325 MG PO TABS: 1.0000 | ORAL_TABLET | Freq: Three times a day (TID) | ORAL | Status: DC | PRN

## 2014-11-07 NOTE — Telephone Encounter (Signed)
ok 

## 2014-11-07 NOTE — Telephone Encounter (Signed)
Rx's printed for provider signature and left pt message ready for pick up

## 2014-11-08 ENCOUNTER — Other Ambulatory Visit: Payer: Self-pay | Admitting: Family Medicine

## 2014-11-08 NOTE — Telephone Encounter (Signed)
Medication refilled per protocol. 

## 2014-11-25 ENCOUNTER — Ambulatory Visit (INDEPENDENT_AMBULATORY_CARE_PROVIDER_SITE_OTHER): Payer: PPO

## 2014-11-25 ENCOUNTER — Ambulatory Visit (INDEPENDENT_AMBULATORY_CARE_PROVIDER_SITE_OTHER): Payer: PPO | Admitting: Orthopedic Surgery

## 2014-11-25 ENCOUNTER — Encounter: Payer: Self-pay | Admitting: Orthopedic Surgery

## 2014-11-25 VITALS — BP 142/89 | Ht 72.0 in | Wt 199.0 lb

## 2014-11-25 DIAGNOSIS — M25552 Pain in left hip: Secondary | ICD-10-CM

## 2014-11-25 DIAGNOSIS — M879 Osteonecrosis, unspecified: Secondary | ICD-10-CM

## 2014-11-25 DIAGNOSIS — M545 Low back pain: Secondary | ICD-10-CM

## 2014-11-25 DIAGNOSIS — M8700 Idiopathic aseptic necrosis of unspecified bone: Secondary | ICD-10-CM

## 2014-11-25 DIAGNOSIS — M5137 Other intervertebral disc degeneration, lumbosacral region: Secondary | ICD-10-CM

## 2014-11-25 NOTE — Progress Notes (Signed)
Patient ID: Brandon Buck, male   DOB: [birth year], 54 y.o.   MRN: [id number]  Chief Complaint  Patient presents with  . Hip Pain    Left hip pain x 6 months, no known injury    HPI Brandon Buck is a 54 y.o. male.  Brandon Buck had core decompression 12 years ago with bone grafting left hip doing well up until about 6 months ago he started having some pain in his left hip leg knee and foot including pain on the top of the foot which is exacerbated in the recumbent position improved with weightbearing. His pain is in attendance constant he complains of throbbing aching pain. He is already on indomethacin and allopurinol although the indomethacin is on a when necessary basis for his gout. He has a history of heart disease she is on a blood thinner and aspirin.   HPI  Review of Systems Review of Systems  He reports history of sinus problems dental problem shortness of breath and wheezing recently seen by his primary care for his respiratory problems. He has a history of chest pain ankle edema related to his heart disease. The burning pain in his leg and the tingling associated with his left lower extremity symptoms for which he is here today. He also has a history of back pain with degenerative disc disease had an MRI in 2007 which showed bulging disc 2 in the lower back. He also has some gait difficulty stiff and swollen joints muscle weakness limited joint pain as described   Past Medical History  Diagnosis Date  . Heart disease   . Kidney disease   . High blood pressure   . Gout   . History of PTCA 2     2 stents    Past Surgical History  Procedure Laterality Date  . Hernia repair    . Hip surgery    . Kidney removed    . Heart catherization      Family History  Problem Relation Age of Onset  . Heart disease    . Cancer    . Kidney disease      Social History History  Substance Use Topics  . Smoking status: Former Research scientist (life sciences)  . Smokeless tobacco: Not on file  . Alcohol Use:  No    No Known Allergies  Current Outpatient Prescriptions  Medication Sig Dispense Refill  . allopurinol (ZYLOPRIM) 100 MG tablet Take 2 tablets (200 mg total) by mouth daily. 60 tablet 5  . ALPRAZolam (XANAX) 0.5 MG tablet Take 1 tablet (0.5 mg total) by mouth 3 (three) times daily as needed for anxiety. 30 tablet 0  . aspirin 81 MG tablet Take 81 mg by mouth daily.    Marland Kitchen atorvastatin (LIPITOR) 40 MG tablet TAKE 1 TABLET BY MOUTH ONCE DAILY AT BEDTIME. 90 tablet 0  . clopidogrel (PLAVIX) 75 MG tablet Take 1 tablet (75 mg total) by mouth daily. 90 tablet 3  . diltiazem (CARDIZEM CD) 240 MG 24 hr capsule TAKE (1) CAPSULE BY MOUTH DAILY. 30 capsule 2  . HYDROcodone-acetaminophen (NORCO/VICODIN) 5-325 MG per tablet Take 1 tablet by mouth 3 (three) times daily as needed for moderate pain. 90 tablet 0  . indomethacin (INDOCIN) 25 MG capsule Take 1 capsule (25 mg total) by mouth daily. 30 capsule 5  . metoprolol (LOPRESSOR) 50 MG tablet TAKE 1/2 TABLET BY MOUTH TWICE A DAY. 30 tablet 3  . nitroGLYCERIN (NITROSTAT) 0.4 MG SL tablet Place 0.4 mg under the tongue  every 5 (five) minutes as needed.    . pantoprazole (PROTONIX) 40 MG tablet Take 1 tablet (40 mg total) by mouth 2 (two) times daily. 60 tablet 5  . tiotropium (SPIRIVA HANDIHALER) 18 MCG inhalation capsule Place 1 capsule (18 mcg total) into inhaler and inhale daily. 30 capsule 12   No current facility-administered medications for this visit.       Physical Exam Blood pressure 142/89, height 6' (1.829 m), weight 199 lb (90.266 kg). Physical Exam Vital signs are stable he's awake alert and oriented 3 mood and affect are normal he is well-groomed is ambulatory status is without assistive devices  His hip is nontender he has full range of motion and it is stable motor function is normal scans intact good pulses are noted distally has sharp and soft and position sense normal in his left leg lymph nodes are negative in the left  groin  Lumbar spine exam  Data Reviewed AP lateral of his left hip show an excellent result from his core decompression although the tract is still noticeable the head is spherical with no signs of arthritis at 12 years this is an excellent result.  Lumbar spine films show  Assessment    He definitely has some nerve root irritation in his left leg from his lower back and I would recommend nonnarcotic medication perhaps gabapentin would work    Plan    Gabapentin 3 times a day 100 mg for 6 weeks

## 2014-11-26 ENCOUNTER — Telehealth: Payer: Self-pay | Admitting: Orthopedic Surgery

## 2014-11-26 NOTE — Telephone Encounter (Signed)
Patient is calling stating that he cant take the Gabapentin 3 times a day 100 mg for 6 weeks he states he is allergic to it, please advise?

## 2014-11-26 NOTE — Telephone Encounter (Signed)
Called patient, no answer 

## 2014-11-27 NOTE — Telephone Encounter (Signed)
Called patient, no answer 

## 2014-12-06 ENCOUNTER — Telehealth: Payer: Self-pay | Admitting: Family Medicine

## 2014-12-06 DIAGNOSIS — F411 Generalized anxiety disorder: Secondary | ICD-10-CM

## 2014-12-06 MED ORDER — ALPRAZOLAM 0.5 MG PO TABS: 0.5000 mg | ORAL_TABLET | Freq: Three times a day (TID) | ORAL | Status: DC | PRN

## 2014-12-06 MED ORDER — HYDROCODONE-ACETAMINOPHEN 5-325 MG PO TABS: 1.0000 | ORAL_TABLET | Freq: Three times a day (TID) | ORAL | Status: DC | PRN

## 2014-12-06 NOTE — Telephone Encounter (Signed)
Patient calling to get refill on his hydrocodone and xanax if possible  [phone]

## 2014-12-06 NOTE — Telephone Encounter (Signed)
RX printed, left up front and patient aware to pick up via

## 2014-12-06 NOTE — Telephone Encounter (Signed)
?   OK to Refill  

## 2014-12-06 NOTE — Telephone Encounter (Signed)
ok 

## 2014-12-11 ENCOUNTER — Telehealth: Payer: Self-pay | Admitting: *Deleted

## 2014-12-11 NOTE — Telephone Encounter (Signed)
Routing to Dr Harrison 

## 2014-12-11 NOTE — Telephone Encounter (Signed)
Received request from pharmacy for PA on Protonix.   PA submitted.   Dx: K21.9- GERD

## 2014-12-11 NOTE — Telephone Encounter (Signed)
LYRICA 50 MG TID

## 2014-12-12 ENCOUNTER — Other Ambulatory Visit: Payer: Self-pay | Admitting: *Deleted

## 2014-12-12 MED ORDER — PREGABALIN 50 MG PO CAPS: 50.0000 mg | ORAL_CAPSULE | Freq: Three times a day (TID) | ORAL | Status: DC

## 2014-12-12 NOTE — Telephone Encounter (Signed)
Med sent, patient aware 

## 2014-12-12 NOTE — Telephone Encounter (Signed)
Called patient, no answer, left vm 

## 2014-12-13 NOTE — Telephone Encounter (Signed)
Received PA determination.   PA approved.  

## 2015-01-02 ENCOUNTER — Telehealth: Payer: Self-pay | Admitting: Family Medicine

## 2015-01-02 DIAGNOSIS — F411 Generalized anxiety disorder: Secondary | ICD-10-CM

## 2015-01-02 NOTE — Telephone Encounter (Signed)
Patient requesting refill on xanax and hydrocodone  [phone]

## 2015-01-02 NOTE — Telephone Encounter (Signed)
?   OK to Refill  

## 2015-01-03 ENCOUNTER — Other Ambulatory Visit: Payer: Self-pay | Admitting: Family Medicine

## 2015-01-03 MED ORDER — ALPRAZOLAM 0.5 MG PO TABS
0.5000 mg | ORAL_TABLET | Freq: Three times a day (TID) | ORAL | Status: DC | PRN
Start: 2015-01-03 — End: 2015-02-04

## 2015-01-03 MED ORDER — HYDROCODONE-ACETAMINOPHEN 5-325 MG PO TABS: 1.0000 | ORAL_TABLET | Freq: Three times a day (TID) | ORAL | Status: DC | PRN

## 2015-01-03 NOTE — Telephone Encounter (Signed)
Refill appropriate and filled per protocol. 

## 2015-01-03 NOTE — Telephone Encounter (Signed)
RX printed, left up front and patient aware to pick up via vm 

## 2015-01-03 NOTE — Telephone Encounter (Signed)
ok 

## 2015-01-07 ENCOUNTER — Ambulatory Visit: Payer: PPO | Admitting: Orthopedic Surgery

## 2015-02-03 ENCOUNTER — Telehealth: Payer: Self-pay | Admitting: Family Medicine

## 2015-02-03 NOTE — Telephone Encounter (Signed)
ok 

## 2015-02-03 NOTE — Telephone Encounter (Signed)
?   OK to Refill  

## 2015-02-03 NOTE — Telephone Encounter (Signed)
[phone]  Pt is needing a refill on HYDROcodone-acetaminophen (NORCO/VICODIN) 5-325 MG per tablet  ALPRAZolam (XANAX) 0.5 MG tablet toporl (50 mg take half twice daily.)

## 2015-02-04 ENCOUNTER — Other Ambulatory Visit: Payer: Self-pay | Admitting: Family Medicine

## 2015-02-04 DIAGNOSIS — F411 Generalized anxiety disorder: Secondary | ICD-10-CM

## 2015-02-04 MED ORDER — ALPRAZOLAM 0.5 MG PO TABS: 0.5000 mg | ORAL_TABLET | Freq: Three times a day (TID) | ORAL | Status: DC | PRN

## 2015-02-04 MED ORDER — METOPROLOL TARTRATE 50 MG PO TABS: ORAL_TABLET | ORAL | Status: DC

## 2015-02-04 MED ORDER — HYDROCODONE-ACETAMINOPHEN 5-325 MG PO TABS: 1.0000 | ORAL_TABLET | Freq: Three times a day (TID) | ORAL | Status: DC | PRN

## 2015-02-04 NOTE — Telephone Encounter (Signed)
RX printed, left up front and patient aware to pick up via vm 

## 2015-03-04 ENCOUNTER — Other Ambulatory Visit: Payer: Self-pay | Admitting: Family Medicine

## 2015-03-04 NOTE — Telephone Encounter (Signed)
Refill appropriate and filled per protocol. 

## 2015-03-06 ENCOUNTER — Telehealth: Payer: Self-pay | Admitting: Family Medicine

## 2015-03-06 DIAGNOSIS — F411 Generalized anxiety disorder: Secondary | ICD-10-CM

## 2015-03-06 MED ORDER — HYDROCODONE-ACETAMINOPHEN 5-325 MG PO TABS: 1.0000 | ORAL_TABLET | Freq: Three times a day (TID) | ORAL | Status: DC | PRN

## 2015-03-06 MED ORDER — ALPRAZOLAM 0.5 MG PO TABS: 0.5000 mg | ORAL_TABLET | Freq: Three times a day (TID) | ORAL | Status: DC | PRN

## 2015-03-06 NOTE — Telephone Encounter (Signed)
[phone] PT is needing a refill on HYDROcodone-acetaminophen (NORCO/VICODIN) 5-325 MG per tablet and ALPRAZolam (XANAX) 0.5 MG tablet

## 2015-03-06 NOTE — Telephone Encounter (Signed)
?   OK to Refill  

## 2015-03-06 NOTE — Telephone Encounter (Signed)
ok 

## 2015-03-06 NOTE — Telephone Encounter (Signed)
RX printed, left up front and patient aware to pick up via vm 

## 2015-03-11 ENCOUNTER — Ambulatory Visit: Payer: Self-pay | Admitting: Family Medicine

## 2015-04-07 ENCOUNTER — Telehealth: Payer: Self-pay | Admitting: Family Medicine

## 2015-04-07 DIAGNOSIS — F411 Generalized anxiety disorder: Secondary | ICD-10-CM

## 2015-04-07 MED ORDER — ALPRAZOLAM 0.5 MG PO TABS: 0.5000 mg | ORAL_TABLET | Freq: Three times a day (TID) | ORAL | Status: DC | PRN

## 2015-04-07 MED ORDER — HYDROCODONE-ACETAMINOPHEN 5-325 MG PO TABS: 1.0000 | ORAL_TABLET | Freq: Three times a day (TID) | ORAL | Status: DC | PRN

## 2015-04-07 NOTE — Telephone Encounter (Signed)
?   OK to Refill  

## 2015-04-07 NOTE — Telephone Encounter (Signed)
RX printed, left up front and patient aware to pick up via vm 

## 2015-04-07 NOTE — Telephone Encounter (Signed)
ok 

## 2015-04-07 NOTE — Telephone Encounter (Signed)
Patient calling for refills of hydrocodone and xanax  [phone] when ready

## 2015-04-16 ENCOUNTER — Encounter: Payer: Self-pay | Admitting: Physician Assistant

## 2015-04-16 ENCOUNTER — Ambulatory Visit (INDEPENDENT_AMBULATORY_CARE_PROVIDER_SITE_OTHER): Payer: PPO | Admitting: Physician Assistant

## 2015-04-16 VITALS — BP 130/80 | HR 68 | Temp 98.1°F | Resp 16 | Wt 196.0 lb

## 2015-04-16 DIAGNOSIS — H6123 Impacted cerumen, bilateral: Secondary | ICD-10-CM | POA: Diagnosis not present

## 2015-04-16 NOTE — Progress Notes (Signed)
Patient ID: Brandon Buck MRN: [id number], DOB: 1961-05-03, 54 y.o. Date of Encounter: 04/16/2015, 11:26 AM    Chief Complaint:  Chief Complaint  Patient presents with  . ears clogged    left ear for week and half     HPI: 54 y.o. year old white male says that his left ear feels blocked up with wax. As he has used over-the-counter earwax remover and peroxide. Says he got some wax out that can still feel a lot in there. Says he has had problems with this in the past and has had to come in and get them cleaned at the doctor's office in the past. No other complaints or concerns today.     Home Meds:   Outpatient Prescriptions Prior to Visit  Medication Sig Dispense Refill  . allopurinol (ZYLOPRIM) 100 MG tablet TAKE 2 TABLETS BY MOUTH DAILY. 60 tablet 3  . ALPRAZolam (XANAX) 0.5 MG tablet Take 1 tablet (0.5 mg total) by mouth 3 (three) times daily as needed for anxiety. 30 tablet 0  . aspirin 81 MG tablet Take 81 mg by mouth daily.    Marland Kitchen atorvastatin (LIPITOR) 40 MG tablet TAKE 1 TABLET BY MOUTH ONCE DAILY AT BEDTIME. 90 tablet 3  . clopidogrel (PLAVIX) 75 MG tablet Take 1 tablet (75 mg total) by mouth daily. 90 tablet 3  . diltiazem (CARDIZEM CD) 240 MG 24 hr capsule TAKE (1) CAPSULE BY MOUTH DAILY. 30 capsule 2  . HYDROcodone-acetaminophen (NORCO/VICODIN) 5-325 MG per tablet Take 1 tablet by mouth 3 (three) times daily as needed for moderate pain. 90 tablet 0  . indomethacin (INDOCIN) 25 MG capsule TAKE ONE CAPSULE BY MOUTH DAILY. 30 capsule 3  . metoprolol (LOPRESSOR) 50 MG tablet TAKE 1/2 TABLET BY MOUTH TWICE A DAY. 30 tablet 5  . nitroGLYCERIN (NITROSTAT) 0.4 MG SL tablet Place 0.4 mg under the tongue every 5 (five) minutes as needed.    . pantoprazole (PROTONIX) 40 MG tablet Take 1 tablet (40 mg total) by mouth 2 (two) times daily. 60 tablet 5  . pregabalin (LYRICA) 50 MG capsule Take 1 capsule (50 mg total) by mouth 3 (three) times daily. 90 capsule 0  . tiotropium (SPIRIVA  HANDIHALER) 18 MCG inhalation capsule Place 1 capsule (18 mcg total) into inhaler and inhale daily. 30 capsule 12   No facility-administered medications prior to visit.    Allergies: No Known Allergies    Review of Systems: See HPI for pertinent ROS. All other ROS negative.    Physical Exam: Blood pressure 130/80, pulse 68, temperature 98.1 F (36.7 C), temperature source Oral, resp. rate 16, weight 196 lb (88.905 kg)., Body mass index is 26.58 kg/(m^2). General: WNWD WM.  Appears in no acute distress. HEENT: Normocephalic, atraumatic, eyes without discharge, sclera non-icteric, nares are without discharge.  Left ear canal is 100% occluded with cerumen. Right ear canal--the inferior 80% is blocked with cerumen. Is a patent area at the upper periphery of the canal. Neck: Supple. No thyromegaly. No lymphadenopathy. Lungs: Clear bilaterally to auscultation without wheezes, rales, or rhonchi. Breathing is unlabored. Heart: Regular rhythm. No murmurs, rubs, or gallops. Msk:  Strength and tone normal for age. Extremities/Skin: Warm and dry.  Neuro: Alert and oriented X 3. Moves all extremities spontaneously. Gait is normal. CNII-XII grossly in tact. Psych:  Responds to questions appropriately with a normal affect.     ASSESSMENT AND PLAN:  54 y.o. year old male with  1. Cerumen impaction, bilateral Will  irrigate both ears now. Patient agreeable with this. Once we get this cleared today, he needs to use over-the-counter drops on a routine basis to keep them cleared and prevent reoccurrence.   [address] [place], [place], [facility] 04/16/2015 11:26 AM

## 2015-04-29 ENCOUNTER — Other Ambulatory Visit: Payer: Self-pay | Admitting: Family Medicine

## 2015-05-06 ENCOUNTER — Telehealth: Payer: Self-pay | Admitting: Family Medicine

## 2015-05-06 DIAGNOSIS — F411 Generalized anxiety disorder: Secondary | ICD-10-CM

## 2015-05-06 MED ORDER — ALPRAZOLAM 0.5 MG PO TABS: 0.5000 mg | ORAL_TABLET | Freq: Three times a day (TID) | ORAL | Status: DC | PRN

## 2015-05-06 MED ORDER — HYDROCODONE-ACETAMINOPHEN 5-325 MG PO TABS: 1.0000 | ORAL_TABLET | Freq: Three times a day (TID) | ORAL | Status: DC | PRN

## 2015-05-06 NOTE — Telephone Encounter (Signed)
Patient calling to get rx for his xanax and hydrocodone  [phone] call when ready

## 2015-05-06 NOTE — Telephone Encounter (Signed)
?   OK to Refill  

## 2015-05-06 NOTE — Telephone Encounter (Signed)
RX printed, left up front and patient aware to pick up via vm 

## 2015-05-06 NOTE — Telephone Encounter (Signed)
ok 

## 2015-05-30 ENCOUNTER — Other Ambulatory Visit: Payer: Self-pay | Admitting: Family Medicine

## 2015-06-04 ENCOUNTER — Telehealth: Payer: Self-pay | Admitting: Family Medicine

## 2015-06-04 DIAGNOSIS — F411 Generalized anxiety disorder: Secondary | ICD-10-CM

## 2015-06-04 MED ORDER — HYDROCODONE-ACETAMINOPHEN 5-325 MG PO TABS: 1.0000 | ORAL_TABLET | Freq: Three times a day (TID) | ORAL | Status: DC | PRN

## 2015-06-04 MED ORDER — ALPRAZOLAM 0.5 MG PO TABS: 0.5000 mg | ORAL_TABLET | Freq: Three times a day (TID) | ORAL | Status: DC | PRN

## 2015-06-04 NOTE — Telephone Encounter (Signed)
?   OK to Refill  

## 2015-06-04 NOTE — Telephone Encounter (Signed)
ok 

## 2015-06-04 NOTE — Telephone Encounter (Signed)
Patient called requesting prescriptions for  HYDROcodone-acetaminophen (NORCO/VICODIN) 5-325 MG per tablet [id number]   ALPRAZolam (XANAX) 0.5 MG tablet [id number]

## 2015-06-04 NOTE — Telephone Encounter (Signed)
RX printed, left up front and patient aware to pick up  

## 2015-07-03 ENCOUNTER — Telehealth: Payer: Self-pay | Admitting: Family Medicine

## 2015-07-03 DIAGNOSIS — F411 Generalized anxiety disorder: Secondary | ICD-10-CM

## 2015-07-03 MED ORDER — ALPRAZOLAM 0.5 MG PO TABS: 0.5000 mg | ORAL_TABLET | Freq: Three times a day (TID) | ORAL | Status: DC | PRN

## 2015-07-03 MED ORDER — HYDROCODONE-ACETAMINOPHEN 5-325 MG PO TABS: 1.0000 | ORAL_TABLET | Freq: Three times a day (TID) | ORAL | Status: DC | PRN

## 2015-07-03 NOTE — Telephone Encounter (Signed)
Patient called requesting refill on HYDROcodone-acetaminophen (NORCO/VICODIN) 5-325 and ALPRAZolam (XANAX) 0.5 MG tablet

## 2015-07-03 NOTE — Telephone Encounter (Signed)
?   OK to Refill  

## 2015-07-03 NOTE — Telephone Encounter (Signed)
RX printed, left up front and patient aware to pick up via vm 

## 2015-07-03 NOTE — Telephone Encounter (Signed)
ok 

## 2015-07-31 ENCOUNTER — Telehealth: Payer: Self-pay | Admitting: Family Medicine

## 2015-07-31 DIAGNOSIS — F411 Generalized anxiety disorder: Secondary | ICD-10-CM

## 2015-07-31 MED ORDER — HYDROCODONE-ACETAMINOPHEN 5-325 MG PO TABS: 1.0000 | ORAL_TABLET | Freq: Three times a day (TID) | ORAL | Status: DC | PRN

## 2015-07-31 MED ORDER — ALPRAZOLAM 0.5 MG PO TABS: 0.5000 mg | ORAL_TABLET | Freq: Three times a day (TID) | ORAL | Status: DC | PRN

## 2015-07-31 NOTE — Telephone Encounter (Signed)
RX printed, left up front and patient aware to pick up via vm 

## 2015-07-31 NOTE — Telephone Encounter (Signed)
ok 

## 2015-07-31 NOTE — Telephone Encounter (Signed)
Patient is calling to get rx for xanax and hydrocodone

## 2015-07-31 NOTE — Telephone Encounter (Signed)
?   OK to Refill  

## 2015-08-06 ENCOUNTER — Emergency Department (HOSPITAL_COMMUNITY)
Admission: EM | Admit: 2015-08-06 | Discharge: 2015-08-06 | Disposition: A | Discharge status: Home / Self Care | Payer: PPO | Attending: Emergency Medicine | Admitting: Emergency Medicine

## 2015-08-06 ENCOUNTER — Emergency Department (HOSPITAL_COMMUNITY): Payer: PPO

## 2015-08-06 ENCOUNTER — Encounter (HOSPITAL_COMMUNITY): Payer: Self-pay | Admitting: Emergency Medicine

## 2015-08-06 DIAGNOSIS — R52 Pain, unspecified: Secondary | ICD-10-CM | POA: Diagnosis present

## 2015-08-06 DIAGNOSIS — Z7982 Long term (current) use of aspirin: Secondary | ICD-10-CM | POA: Insufficient documentation

## 2015-08-06 DIAGNOSIS — Z9889 Other specified postprocedural states: Secondary | ICD-10-CM | POA: Insufficient documentation

## 2015-08-06 DIAGNOSIS — R5383 Other fatigue: Secondary | ICD-10-CM | POA: Diagnosis not present

## 2015-08-06 DIAGNOSIS — I10 Essential (primary) hypertension: Secondary | ICD-10-CM | POA: Diagnosis not present

## 2015-08-06 DIAGNOSIS — Z7902 Long term (current) use of antithrombotics/antiplatelets: Secondary | ICD-10-CM | POA: Diagnosis not present

## 2015-08-06 DIAGNOSIS — Z79899 Other long term (current) drug therapy: Secondary | ICD-10-CM | POA: Diagnosis not present

## 2015-08-06 DIAGNOSIS — Z87448 Personal history of other diseases of urinary system: Secondary | ICD-10-CM | POA: Diagnosis not present

## 2015-08-06 DIAGNOSIS — M109 Gout, unspecified: Secondary | ICD-10-CM | POA: Diagnosis not present

## 2015-08-06 DIAGNOSIS — Z9861 Coronary angioplasty status: Secondary | ICD-10-CM | POA: Diagnosis not present

## 2015-08-06 DIAGNOSIS — Z87891 Personal history of nicotine dependence: Secondary | ICD-10-CM | POA: Diagnosis not present

## 2015-08-06 DIAGNOSIS — N390 Urinary tract infection, site not specified: Secondary | ICD-10-CM | POA: Diagnosis not present

## 2015-08-06 LAB — CBC WITH DIFFERENTIAL/PLATELET
BASOS ABS: 0 10*3/uL (ref 0.0–0.1)
BASOS PCT: 0 %
EOS ABS: 0 10*3/uL (ref 0.0–0.7)
Eosinophils Relative: 0 %
HCT: 44.2 % (ref 39.0–52.0)
HEMOGLOBIN: 15.2 g/dL (ref 13.0–17.0)
Lymphocytes Relative: 15 %
Lymphs Abs: 1.6 10*3/uL (ref 0.7–4.0)
MCH: 30.9 pg (ref 26.0–34.0)
MCHC: 34.4 g/dL (ref 30.0–36.0)
MCV: 89.8 fL (ref 78.0–100.0)
MONOS PCT: 8 %
Monocytes Absolute: 0.8 10*3/uL (ref 0.1–1.0)
NEUTROS ABS: 7.7 10*3/uL (ref 1.7–7.7)
NEUTROS PCT: 77 %
Platelets: 212 10*3/uL (ref 150–400)
RBC: 4.92 MIL/uL (ref 4.22–5.81)
RDW: 13 % (ref 11.5–15.5)
WBC: 10.1 10*3/uL (ref 4.0–10.5)

## 2015-08-06 LAB — COMPREHENSIVE METABOLIC PANEL
ALBUMIN: 4 g/dL (ref 3.5–5.0)
ALT: 13 U/L — ABNORMAL LOW (ref 17–63)
ANION GAP: 7 (ref 5–15)
AST: 15 U/L (ref 15–41)
Alkaline Phosphatase: 69 U/L (ref 38–126)
BUN: 13 mg/dL (ref 6–20)
CO2: 26 mmol/L (ref 22–32)
Calcium: 9.6 mg/dL (ref 8.9–10.3)
Chloride: 105 mmol/L (ref 101–111)
Creatinine, Ser: 1.5 mg/dL — ABNORMAL HIGH (ref 0.61–1.24)
GFR calc Af Amer: 59 mL/min — ABNORMAL LOW (ref >60)
GFR calc non Af Amer: 51 mL/min — ABNORMAL LOW (ref >60)
GLUCOSE: 95 mg/dL (ref 65–99)
POTASSIUM: 4.4 mmol/L (ref 3.5–5.1)
SODIUM: 138 mmol/L (ref 135–145)
Total Bilirubin: 0.9 mg/dL (ref 0.3–1.2)
Total Protein: 7.8 g/dL (ref 6.5–8.1)

## 2015-08-06 LAB — URINALYSIS, ROUTINE W REFLEX MICROSCOPIC
BILIRUBIN URINE: NEGATIVE
Glucose, UA: NEGATIVE mg/dL
Ketones, ur: NEGATIVE mg/dL
Leukocytes, UA: NEGATIVE
Nitrite: NEGATIVE
PH: 6.5 (ref 5.0–8.0)
Protein, ur: 30 mg/dL — AB
SPECIFIC GRAVITY, URINE: 1.025 (ref 1.005–1.030)
Urobilinogen, UA: 0.2 mg/dL (ref 0.0–1.0)

## 2015-08-06 LAB — URINE MICROSCOPIC-ADD ON

## 2015-08-06 MED ORDER — CEPHALEXIN 500 MG PO CAPS: 500.0000 mg | ORAL_CAPSULE | Freq: Four times a day (QID) | ORAL | Status: DC

## 2015-08-06 NOTE — Discharge Instructions (Signed)

## 2015-08-06 NOTE — ED Notes (Signed)
Patient given water to drink at this time. Per RN approval.

## 2015-08-06 NOTE — ED Notes (Signed)
Pt reports generalized bodyaches since Monday after he got flu shot, pt c/o glands in neck being swollen, no other complaints.

## 2015-08-06 NOTE — ED Provider Notes (Signed)
CSN: [id number]     Arrival date & time 08/06/15  1032 History   First MD Initiated Contact with Patient 08/06/15 1125     Chief Complaint  Patient presents with  . Generalized Body Aches     (Consider location/radiation/quality/duration/timing/severity/associated sxs/prior Treatment) Patient is a 54 y.o. male presenting with musculoskeletal pain. The history is provided by the patient. No language interpreter was used.  Muscle Pain This is a new problem. The current episode started today. The problem occurs constantly. The problem has been gradually worsening. Associated symptoms include fatigue and myalgias. Pertinent negatives include no fever, headaches, joint swelling, sore throat or vomiting. Nothing aggravates the symptoms. He has tried nothing for the symptoms. The treatment provided moderate relief.  Pt complains of bodyaches.  Pt reports he had a flu shot and began feeling bad.  Pt feels like he has swollen glands.  Pt reports his neck and upper back ache.   Past Medical History  Diagnosis Date  . Heart disease   . Kidney disease   . High blood pressure   . Gout   . History of PTCA 2     2 stents   Past Surgical History  Procedure Laterality Date  . Hernia repair    . Hip surgery    . Kidney removed    . Heart catherization     Family History  Problem Relation Age of Onset  . Heart disease    . Cancer    . Kidney disease     Social History  Substance Use Topics  . Smoking status: Former Research scientist (life sciences)  . Smokeless tobacco: None  . Alcohol Use: Yes     Comment: occ    Review of Systems  Constitutional: Positive for fatigue. Negative for fever.  HENT: Negative for sore throat.   Gastrointestinal: Negative for vomiting.  Musculoskeletal: Positive for myalgias. Negative for joint swelling.  Neurological: Negative for headaches.  All other systems reviewed and are negative.     Allergies  Review of patient's allergies indicates no known allergies.  Home  Medications   Prior to Admission medications   Medication Sig Start Date End Date Taking? Authorizing Provider  allopurinol (ZYLOPRIM) 100 MG tablet TAKE 2 TABLETS BY MOUTH DAILY. 01/03/15  Yes [name], MD  ALPRAZolam Duanne Moron) 0.5 MG tablet Take 1 tablet (0.5 mg total) by mouth 3 (three) times daily as needed for anxiety. 07/31/15  Yes [name], MD  aspirin 81 MG tablet Take 81 mg by mouth daily.   Yes Historical Provider, MD  atorvastatin (LIPITOR) 40 MG tablet TAKE 1 TABLET BY MOUTH ONCE DAILY AT BEDTIME. Patient taking differently: TAKE 1 TABLET BY MOUTH ONCE DAILY. 03/04/15  Yes [name], MD  clopidogrel (PLAVIX) 75 MG tablet Take 1 tablet (75 mg total) by mouth daily. 08/06/14  Yes [name], MD  diltiazem (CARDIZEM CD) 240 MG 24 hr capsule TAKE (1) CAPSULE BY MOUTH DAILY. 04/30/15  Yes [name], MD  HYDROcodone-acetaminophen (NORCO/VICODIN) 5-325 MG tablet Take 1 tablet by mouth 3 (three) times daily as needed for moderate pain. 07/31/15  Yes [name], MD  indomethacin (INDOCIN) 25 MG capsule Take 25 mg by mouth daily as needed (gout).    Yes Historical Provider, MD  metoprolol (LOPRESSOR) 50 MG tablet TAKE 1/2 TABLET BY MOUTH TWICE A DAY. 02/04/15  Yes [name], MD  nitroGLYCERIN (NITROSTAT) 0.4 MG SL tablet Place 0.4 mg under the tongue every 5 (five)  minutes as needed.   Yes Historical Provider, MD  pantoprazole (PROTONIX) 40 MG tablet TAKE ONE TABLET BY MOUTH TWICE DAILY. 06/02/15  Yes [name], MD  tiotropium (SPIRIVA HANDIHALER) 18 MCG inhalation capsule Place 1 capsule (18 mcg total) into inhaler and inhale daily. Patient taking differently: Place 18 mcg into inhaler and inhale daily as needed (shortness of breath).  08/14/14  Yes [name], MD  pregabalin (LYRICA) 50 MG capsule Take 1 capsule (50 mg total) by mouth 3 (three) times daily. Patient not taking: Reported on 08/06/2015 12/12/14   Carole Civil, MD   BP  135/86 mmHg  Pulse 114  Temp(Src) 98.1 F (36.7 C) (Oral)  Resp 23  Ht 6' (1.829 m)  Wt 198 lb (89.812 kg)  BMI 26.85 kg/m2  SpO2 95% Physical Exam  Constitutional: He is oriented to person, place, and time. He appears well-developed and well-nourished.  HENT:  Head: Normocephalic and atraumatic.  Right Ear: External ear normal.  Left Ear: External ear normal.  Eyes: Conjunctivae and EOM are normal. Pupils are equal, round, and reactive to light.  Neck: Normal range of motion.  Cardiovascular: Normal rate and normal heart sounds.   Pulmonary/Chest: Effort normal.  Abdominal: He exhibits no distension.  Musculoskeletal: Normal range of motion.  Neurological: He is alert and oriented to person, place, and time.  Skin: Skin is warm.  Psychiatric: He has a normal mood and affect.  Nursing note and vitals reviewed.   ED Course  Procedures (including critical care time) Labs Review Labs Reviewed  COMPREHENSIVE METABOLIC PANEL - Abnormal; Notable for the following:    Creatinine, Ser 1.50 (*)    ALT 13 (*)    GFR calc non Af Amer 51 (*)    GFR calc Af Amer 59 (*)    All other components within normal limits  URINALYSIS, ROUTINE W REFLEX MICROSCOPIC (NOT AT [facility]) - Abnormal; Notable for the following:    Hgb urine dipstick SMALL (*)    Protein, ur 30 (*)    All other components within normal limits  URINE MICROSCOPIC-ADD ON - Abnormal; Notable for the following:    Casts GRANULAR CAST (*)    All other components within normal limits  CBC WITH DIFFERENTIAL/PLATELET    Imaging Review Dg Chest 2 View  08/06/2015  CLINICAL DATA:  Body aches. EXAM: CHEST  2 VIEW COMPARISON:  09/05/2014 FINDINGS: Heart size and pulmonary vascularity are normal. The patient has chronic obstructive and interstitial disease with scarring at the left lung base and blebs at the lung apices, all unchanged. Chronic peribronchial thickening. No acute abnormalities. Slight accentuation of the thoracic  kyphosis. There appears to be a coronary artery stent in place. IMPRESSION: No acute abnormalities. Chronic obstructive and interstitial lung disease. Electronically Signed   By: Lorriane Shire M.D.   On: 08/06/2015 13:14   I have personally reviewed and evaluated these images and lab results as part of my medical decision-making.   EKG Interpretation None      MDM  Pt has a history of renal cancer and only has one kidney.  No uti symptoms.  Urine shows 7-10 wbc's  Urine culture ordered.  Pt advised to follow up with Dr. Dennard Schaumann for recheck in 2-3 days   Final diagnoses:  UTI (lower urinary tract infection)    Keflex avs    Fransico Meadow, PA-C 08/06/15 [place], PA-C 08/06/15 [place], PA-C 08/06/15 1354  Milton Ferguson, MD 08/07/15 1538

## 2015-08-07 LAB — URINE CULTURE: Culture: 1000

## 2015-08-11 ENCOUNTER — Ambulatory Visit (INDEPENDENT_AMBULATORY_CARE_PROVIDER_SITE_OTHER): Payer: PPO | Admitting: Family Medicine

## 2015-08-11 ENCOUNTER — Encounter: Payer: Self-pay | Admitting: Family Medicine

## 2015-08-11 VITALS — BP 130/78 | HR 100 | Temp 98.6°F | Resp 18 | Ht 72.0 in | Wt 195.0 lb

## 2015-08-11 DIAGNOSIS — Z09 Encounter for follow-up examination after completed treatment for conditions other than malignant neoplasm: Secondary | ICD-10-CM

## 2015-08-11 LAB — URINALYSIS, ROUTINE W REFLEX MICROSCOPIC
Bilirubin Urine: NEGATIVE
Glucose, UA: NEGATIVE
Ketones, ur: NEGATIVE
LEUKOCYTES UA: NEGATIVE
NITRITE: NEGATIVE
SPECIFIC GRAVITY, URINE: 1.03 (ref 1.001–1.035)
pH: 5.5 (ref 5.0–8.0)

## 2015-08-11 LAB — URINALYSIS, MICROSCOPIC ONLY
CRYSTALS: NONE SEEN [HPF]
Casts: NONE SEEN [LPF]
Yeast: NONE SEEN [HPF]

## 2015-08-11 NOTE — Progress Notes (Signed)
Subjective:    Patient ID: Brandon Buck, male    DOB: 1961-09-25, 54 y.o.   MRN: [id number]  HPI Patient went to the emergency room Wednesday of last week complaining of swollen glands in his neck, subjective fevers, diffuse body aches, occasional cough. Symptoms began the previous Friday after he received his flu shot. By Wednesday the symptoms they got so bad that he felt like he needed to go the emergency room. Workup in the emergency room was unremarkable. Urinalysis was benign however urine microscopic exam reveals 7-10 white blood cells. Given his symptoms and his past medical history, they treated the patient empirically for latter infection with Keflex 500 mg by mouth 3 times a day. Urine culture returned several days later showing 1000 colonies of insignificant growth suggesting against a urinary tract infection. Patient's symptoms have now essentially subsided. The glands in his neck have decreased in size back to normal. The myalgias have improved. The fevers have stopped. He is still slightly sore in his chest muscles when he coughs but otherwise he feels normal Past Medical History  Diagnosis Date  . Heart disease   . Kidney disease   . High blood pressure   . Gout   . History of PTCA 2     2 stents   Past Surgical History  Procedure Laterality Date  . Hernia repair    . Hip surgery    . Kidney removed    . Heart catherization     Current Outpatient Prescriptions on File Prior to Visit  Medication Sig Dispense Refill  . allopurinol (ZYLOPRIM) 100 MG tablet TAKE 2 TABLETS BY MOUTH DAILY. 60 tablet 3  . ALPRAZolam (XANAX) 0.5 MG tablet Take 1 tablet (0.5 mg total) by mouth 3 (three) times daily as needed for anxiety. 30 tablet 0  . aspirin 81 MG tablet Take 81 mg by mouth daily.    Marland Kitchen atorvastatin (LIPITOR) 40 MG tablet TAKE 1 TABLET BY MOUTH ONCE DAILY AT BEDTIME. (Patient taking differently: TAKE 1 TABLET BY MOUTH ONCE DAILY.) 90 tablet 3  . cephALEXin (KEFLEX) 500 MG  capsule Take 1 capsule (500 mg total) by mouth 4 (four) times daily. 40 capsule 0  . clopidogrel (PLAVIX) 75 MG tablet Take 1 tablet (75 mg total) by mouth daily. 90 tablet 3  . diltiazem (CARDIZEM CD) 240 MG 24 hr capsule TAKE (1) CAPSULE BY MOUTH DAILY. 30 capsule 3  . HYDROcodone-acetaminophen (NORCO/VICODIN) 5-325 MG tablet Take 1 tablet by mouth 3 (three) times daily as needed for moderate pain. 90 tablet 0  . indomethacin (INDOCIN) 25 MG capsule Take 25 mg by mouth daily as needed (gout).     . metoprolol (LOPRESSOR) 50 MG tablet TAKE 1/2 TABLET BY MOUTH TWICE A DAY. 30 tablet 5  . nitroGLYCERIN (NITROSTAT) 0.4 MG SL tablet Place 0.4 mg under the tongue every 5 (five) minutes as needed.    . pantoprazole (PROTONIX) 40 MG tablet TAKE ONE TABLET BY MOUTH TWICE DAILY. 60 tablet 11  . tiotropium (SPIRIVA HANDIHALER) 18 MCG inhalation capsule Place 1 capsule (18 mcg total) into inhaler and inhale daily. (Patient taking differently: Place 18 mcg into inhaler and inhale daily as needed (shortness of breath). ) 30 capsule 12   No current facility-administered medications on file prior to visit.   No Known Allergies Social History   Social History  . Marital Status: Married    Spouse Name: N/A  . Number of Children: N/A  . Years of Education:  N/A   Occupational History  . Not on file.   Social History Main Topics  . Smoking status: Former Research scientist (life sciences)  . Smokeless tobacco: Not on file  . Alcohol Use: Yes     Comment: occ  . Drug Use: No  . Sexual Activity: Not on file   Other Topics Concern  . Not on file   Social History Narrative      Review of Systems  All other systems reviewed and are negative.      Objective:   Physical Exam  Constitutional: He appears well-developed and well-nourished. No distress.  HENT:  Right Ear: External ear normal.  Left Ear: External ear normal.  Nose: Nose normal.  Mouth/Throat: Oropharynx is clear and moist. No oropharyngeal exudate.  Eyes:  Conjunctivae are normal. No scleral icterus.  Neck: Neck supple.  Cardiovascular: Normal rate, regular rhythm and normal heart sounds.   Pulmonary/Chest: Effort normal and breath sounds normal. No respiratory distress. He has no wheezes. He has no rales.  Abdominal: Soft. Bowel sounds are normal. He exhibits no distension. There is no tenderness. There is no rebound.  Lymphadenopathy:    He has no cervical adenopathy.  Skin: He is not diaphoretic.  Vitals reviewed.         Assessment & Plan:  Hospital discharge follow-up - Plan: Urinalysis Dipstick, Basic Metabolic Panel  Patient's exam today is completely normal. Urine culture shows no evidence of urinary tract infection. I will repeat urinalysis as well as BMP to monitor renal function. I believe the patient can discontinue the Keflex now. Whether this is related to his flu shot or an upper respiratory infection is to be determined however I do believe it is viral in nature. Furthermore the patient is clinically improved. No further treatment is necessary at this time

## 2015-08-12 LAB — BASIC METABOLIC PANEL
BUN: 17 mg/dL (ref 7–25)
CHLORIDE: 103 mmol/L (ref 98–110)
CO2: 24 mmol/L (ref 20–31)
CREATININE: 1.45 mg/dL — AB (ref 0.70–1.33)
Calcium: 9.7 mg/dL (ref 8.6–10.3)
Glucose, Bld: 68 mg/dL — ABNORMAL LOW (ref 70–99)
POTASSIUM: 4.4 mmol/L (ref 3.5–5.3)
Sodium: 140 mmol/L (ref 135–146)

## 2015-08-28 ENCOUNTER — Telehealth: Payer: Self-pay | Admitting: Family Medicine

## 2015-08-28 DIAGNOSIS — F411 Generalized anxiety disorder: Secondary | ICD-10-CM

## 2015-08-28 MED ORDER — HYDROCODONE-ACETAMINOPHEN 5-325 MG PO TABS: 1.0000 | ORAL_TABLET | Freq: Three times a day (TID) | ORAL | Status: DC | PRN

## 2015-08-28 MED ORDER — ALPRAZOLAM 0.5 MG PO TABS: 0.5000 mg | ORAL_TABLET | Freq: Three times a day (TID) | ORAL | Status: DC | PRN

## 2015-08-28 NOTE — Telephone Encounter (Signed)
Ok to refill??  Last office visit 08/11/2015.  Last refill on both 07/31/2015.

## 2015-08-28 NOTE — Telephone Encounter (Signed)
PHARMACY: [email or web address]  MEDICATION: Duanne Moron & VICODIN   QTY:    SIG:    PHYSICIAN: PICKARD   PT. PHONE #: [phone]

## 2015-08-28 NOTE — Telephone Encounter (Signed)
ok 

## 2015-08-28 NOTE — Telephone Encounter (Signed)
Prescription printed and patient made aware to come to office to pick up per VM.

## 2015-09-29 ENCOUNTER — Telehealth: Payer: Self-pay | Admitting: *Deleted

## 2015-09-29 DIAGNOSIS — F411 Generalized anxiety disorder: Secondary | ICD-10-CM

## 2015-09-29 MED ORDER — HYDROCODONE-ACETAMINOPHEN 5-325 MG PO TABS: 1.0000 | ORAL_TABLET | Freq: Three times a day (TID) | ORAL | Status: DC | PRN

## 2015-09-29 MED ORDER — ALPRAZOLAM 0.5 MG PO TABS: 0.5000 mg | ORAL_TABLET | Freq: Three times a day (TID) | ORAL | Status: DC | PRN

## 2015-09-29 NOTE — Telephone Encounter (Signed)
SCRIPT PRINTED READY FOR PROVIDER SIGNATURE

## 2015-09-29 NOTE — Telephone Encounter (Signed)
Pt needs refill on [place]   Last rf:08/28/15  Last ov: 08/11/15  ? Ok to refill

## 2015-09-29 NOTE — Telephone Encounter (Signed)
ok 

## 2015-10-28 ENCOUNTER — Telehealth: Payer: Self-pay | Admitting: Family Medicine

## 2015-10-28 DIAGNOSIS — F411 Generalized anxiety disorder: Secondary | ICD-10-CM

## 2015-10-28 NOTE — Telephone Encounter (Signed)
Pt is calling for a refill of Hydro 5-325 and Alprazolam 0.5 mg.  [phone]

## 2015-10-30 MED ORDER — HYDROCODONE-ACETAMINOPHEN 5-325 MG PO TABS: 1.0000 | ORAL_TABLET | Freq: Three times a day (TID) | ORAL | Status: DC | PRN

## 2015-10-30 MED ORDER — ALPRAZOLAM 0.5 MG PO TABS: 0.5000 mg | ORAL_TABLET | Freq: Three times a day (TID) | ORAL | Status: DC | PRN

## 2015-10-30 NOTE — Telephone Encounter (Signed)
RX printed, left up front and patient aware to pick up tomorrow am via vm

## 2015-10-30 NOTE — Telephone Encounter (Signed)
?   OK to Refill  

## 2015-10-30 NOTE — Telephone Encounter (Signed)
ok 

## 2015-12-01 ENCOUNTER — Telehealth: Payer: Self-pay | Admitting: Family Medicine

## 2015-12-01 DIAGNOSIS — F411 Generalized anxiety disorder: Secondary | ICD-10-CM

## 2015-12-01 MED ORDER — HYDROCODONE-ACETAMINOPHEN 5-325 MG PO TABS: 1.0000 | ORAL_TABLET | Freq: Three times a day (TID) | ORAL | Status: DC | PRN

## 2015-12-01 MED ORDER — ALPRAZOLAM 0.5 MG PO TABS: 0.5000 mg | ORAL_TABLET | Freq: Three times a day (TID) | ORAL | Status: DC | PRN

## 2015-12-01 NOTE — Telephone Encounter (Signed)
Script printed ready for provider signature 

## 2015-12-01 NOTE — Telephone Encounter (Signed)
?   Ok to refill  Last rf:10/30/15   Last ov:08/11/15

## 2015-12-01 NOTE — Telephone Encounter (Signed)
ok 

## 2015-12-01 NOTE — Telephone Encounter (Signed)
Patient needs rx for his vicotin and xanax  [phone] (H)

## 2015-12-29 ENCOUNTER — Telehealth: Payer: Self-pay | Admitting: Family Medicine

## 2015-12-29 DIAGNOSIS — F411 Generalized anxiety disorder: Secondary | ICD-10-CM

## 2015-12-29 MED ORDER — ALPRAZOLAM 0.5 MG PO TABS: 0.5000 mg | ORAL_TABLET | Freq: Three times a day (TID) | ORAL | Status: DC | PRN

## 2015-12-29 MED ORDER — HYDROCODONE-ACETAMINOPHEN 5-325 MG PO TABS: 1.0000 | ORAL_TABLET | Freq: Three times a day (TID) | ORAL | Status: DC | PRN

## 2015-12-29 NOTE — Telephone Encounter (Signed)
?   OK to Refill  

## 2015-12-29 NOTE — Telephone Encounter (Signed)
okay

## 2015-12-29 NOTE — Telephone Encounter (Signed)
RX printed, left up front and patient aware to pick up via vm to pu in am

## 2015-12-29 NOTE — Telephone Encounter (Signed)
Patient calling requesting his ALPRAZolam (XANAX) 0.5 MG tablet and his HYDROcodone-acetaminophen (NORCO/VICODIN) 5-325 MG tablet  CB#[phone]

## 2016-01-26 ENCOUNTER — Telehealth: Payer: Self-pay | Admitting: *Deleted

## 2016-01-26 NOTE — Telephone Encounter (Signed)
ok 

## 2016-01-26 NOTE — Telephone Encounter (Signed)
Pt calling needs refill on HYDROcodone-acetaminophen (NORCO/VICODIN) 5-325 MG tablet and ALPRAZolam (XANAX) 0.5 MG tablet  Last rf: 12/29/15  Last ov: 08/11/15  ? Ok to refill

## 2016-01-27 ENCOUNTER — Other Ambulatory Visit: Payer: Self-pay | Admitting: *Deleted

## 2016-01-27 DIAGNOSIS — F411 Generalized anxiety disorder: Secondary | ICD-10-CM

## 2016-01-27 MED ORDER — ALPRAZOLAM 0.5 MG PO TABS: 0.5000 mg | ORAL_TABLET | Freq: Three times a day (TID) | ORAL | Status: DC | PRN

## 2016-01-27 MED ORDER — HYDROCODONE-ACETAMINOPHEN 5-325 MG PO TABS: 1.0000 | ORAL_TABLET | Freq: Three times a day (TID) | ORAL | Status: DC | PRN

## 2016-01-27 NOTE — Telephone Encounter (Signed)
Pt came in to office to pick up prescription only had Hydrocodone printed and wanted the xanax printed also, script printed provider signed and given to pt.

## 2016-01-27 NOTE — Telephone Encounter (Signed)
Script printed,ready for provider signature 

## 2016-02-05 DIAGNOSIS — D225 Melanocytic nevi of trunk: Secondary | ICD-10-CM | POA: Diagnosis not present

## 2016-02-05 DIAGNOSIS — L281 Prurigo nodularis: Secondary | ICD-10-CM | POA: Diagnosis not present

## 2016-02-25 ENCOUNTER — Telehealth: Payer: Self-pay | Admitting: Family Medicine

## 2016-02-25 DIAGNOSIS — F411 Generalized anxiety disorder: Secondary | ICD-10-CM

## 2016-02-25 NOTE — Telephone Encounter (Signed)
Ok to refill 

## 2016-02-25 NOTE — Telephone Encounter (Signed)
Patient asking for refills on his vicodin and xanax  [phone]

## 2016-02-26 MED ORDER — ALPRAZOLAM 0.5 MG PO TABS: 0.5000 mg | ORAL_TABLET | Freq: Three times a day (TID) | ORAL | Status: DC | PRN

## 2016-02-26 MED ORDER — HYDROCODONE-ACETAMINOPHEN 5-325 MG PO TABS: 1.0000 | ORAL_TABLET | Freq: Three times a day (TID) | ORAL | Status: DC | PRN

## 2016-02-26 NOTE — Telephone Encounter (Signed)
ok 

## 2016-02-26 NOTE — Telephone Encounter (Signed)
RX printed, left up front and patient aware to pick up after via vm

## 2016-03-08 ENCOUNTER — Ambulatory Visit (INDEPENDENT_AMBULATORY_CARE_PROVIDER_SITE_OTHER): Payer: PPO | Admitting: Physician Assistant

## 2016-03-08 ENCOUNTER — Encounter: Payer: Self-pay | Admitting: Physician Assistant

## 2016-03-08 VITALS — BP 148/86 | HR 96 | Temp 97.9°F | Resp 18 | Wt 200.0 lb

## 2016-03-08 DIAGNOSIS — W57XXXA Bitten or stung by nonvenomous insect and other nonvenomous arthropods, initial encounter: Secondary | ICD-10-CM

## 2016-03-08 DIAGNOSIS — T148 Other injury of unspecified body region: Secondary | ICD-10-CM | POA: Diagnosis not present

## 2016-03-08 DIAGNOSIS — L309 Dermatitis, unspecified: Secondary | ICD-10-CM

## 2016-03-08 MED ORDER — DOXYCYCLINE HYCLATE 100 MG PO TABS: 100.0000 mg | ORAL_TABLET | Freq: Two times a day (BID) | ORAL | Status: DC

## 2016-03-08 MED ORDER — PREDNISONE 20 MG PO TABS: ORAL_TABLET | ORAL | Status: DC

## 2016-03-08 NOTE — Progress Notes (Signed)
Patient ID: AVIYON RINELLA MRN: [id number], DOB: 1961/10/10, 55 y.o. Date of Encounter: 03/08/2016, 11:45 AM    Chief Complaint:  Chief Complaint  Patient presents with  . tick bite on foot    foot seems infected     HPI: 55 y.o. year old white male says that last Wednesday (03/03/16) his right foot was itching. Says he then found a tic in web space between 1st - 2nd toes. Removed tic.  Entire foot still very itchy. Applying "itch cream" with no relief. Also area between toes pink and raised. So came in for OV.  Has had no other area of rash. No fever. No headache. No myalgias. No malaise.      Home Meds:   Outpatient Prescriptions Prior to Visit  Medication Sig Dispense Refill  . allopurinol (ZYLOPRIM) 100 MG tablet TAKE 2 TABLETS BY MOUTH DAILY. 60 tablet 3  . ALPRAZolam (XANAX) 0.5 MG tablet Take 1 tablet (0.5 mg total) by mouth 3 (three) times daily as needed for anxiety. 30 tablet 0  . aspirin 81 MG tablet Take 81 mg by mouth daily.    Marland Kitchen atorvastatin (LIPITOR) 40 MG tablet TAKE 1 TABLET BY MOUTH ONCE DAILY AT BEDTIME. (Patient taking differently: TAKE 1 TABLET BY MOUTH ONCE DAILY.) 90 tablet 3  . clopidogrel (PLAVIX) 75 MG tablet Take 1 tablet (75 mg total) by mouth daily. 90 tablet 3  . diltiazem (CARDIZEM CD) 240 MG 24 hr capsule TAKE (1) CAPSULE BY MOUTH DAILY. 30 capsule 3  . HYDROcodone-acetaminophen (NORCO/VICODIN) 5-325 MG tablet Take 1 tablet by mouth 3 (three) times daily as needed for moderate pain. 90 tablet 0  . indomethacin (INDOCIN) 25 MG capsule Take 25 mg by mouth daily as needed (gout).     . metoprolol (LOPRESSOR) 50 MG tablet TAKE 1/2 TABLET BY MOUTH TWICE A DAY. 30 tablet 5  . nitroGLYCERIN (NITROSTAT) 0.4 MG SL tablet Place 0.4 mg under the tongue every 5 (five) minutes as needed.    . pantoprazole (PROTONIX) 40 MG tablet TAKE ONE TABLET BY MOUTH TWICE DAILY. 60 tablet 11  . tiotropium (SPIRIVA HANDIHALER) 18 MCG inhalation capsule Place 1 capsule (18 mcg  total) into inhaler and inhale daily. (Patient taking differently: Place 18 mcg into inhaler and inhale daily as needed (shortness of breath). ) 30 capsule 12  . cephALEXin (KEFLEX) 500 MG capsule Take 1 capsule (500 mg total) by mouth 4 (four) times daily. 40 capsule 0   No facility-administered medications prior to visit.    Allergies:  Allergies  Allergen Reactions  . Keflex [Cephalexin] Rash      Review of Systems: See HPI for pertinent ROS. All other ROS negative.    Physical Exam: Blood pressure 148/86, pulse 96, temperature 97.9 F (36.6 C), temperature source Oral, resp. rate 18, weight 200 lb (90.719 kg)., Body mass index is 27.12 kg/(m^2). General:  WNWD WM. Appears in no acute distress. Neck: Supple. No thyromegaly. No lymphadenopathy. Lungs: Clear bilaterally to auscultation without wheezes, rales, or rhonchi. Breathing is unlabored. Heart: Regular rhythm. No murmurs, rubs, or gallops. Msk:  Strength and tone normal for age. Extremities/Skin: Right Foot: Web Space between 1st - 2nd toes--pink erythema, slightly raised-- ~ 1 cm.  Just proximal to 1st, 2nd toes on dorsum of foot---a few scattered tiny pink papules. No other skin changes. No other rash.  Neuro: Alert and oriented X 3. Moves all extremities spontaneously. Gait is normal. CNII-XII grossly in tact. Psych:  Responds  to questions appropriately with a normal affect.     ASSESSMENT AND PLAN:  55 y.o. year old male with  1. Tick bite - doxycycline (VIBRA-TABS) 100 MG tablet; Take 1 tablet (100 mg total) by mouth 2 (two) times daily.  Dispense: 20 tablet; Refill: 0  2. Dermatitis - predniSONE (DELTASONE) 20 MG tablet; Take 3 daily for 2 days, then 2 daily for 2 days, then 1 daily for 2 days.  Dispense: 12 tablet; Refill: 0  He is to take Doxycycline and Prdedisone as directed. Also take Benadryl as directed. F/U if does not resolve.  Signed, [address] [place], [place], [facility] 03/08/2016 11:45 AM

## 2016-03-29 ENCOUNTER — Telehealth: Payer: Self-pay | Admitting: Family Medicine

## 2016-03-29 DIAGNOSIS — F411 Generalized anxiety disorder: Secondary | ICD-10-CM

## 2016-03-29 MED ORDER — HYDROCODONE-ACETAMINOPHEN 5-325 MG PO TABS: 1.0000 | ORAL_TABLET | Freq: Three times a day (TID) | ORAL | Status: DC | PRN

## 2016-03-29 MED ORDER — ALPRAZOLAM 0.5 MG PO TABS: 0.5000 mg | ORAL_TABLET | Freq: Three times a day (TID) | ORAL | Status: DC | PRN

## 2016-03-29 NOTE — Telephone Encounter (Signed)
Last seen 03/08/2016, last filled 02/26/2016.  Please advise.

## 2016-03-29 NOTE — Telephone Encounter (Signed)
Xanax called into [facility].  Hydrocodone rx printed. (looks like it was printed twice, I was having computer issues).  Left message informing patient of refills.

## 2016-03-29 NOTE — Telephone Encounter (Signed)
Okay to refill pt needs an appt for medication review with Dr. Dennard Schaumann Recent tick visit with Olean Ree is not sufficient

## 2016-03-29 NOTE — Telephone Encounter (Signed)
Patients needs rx for his hydrocodone and also needs xanax refilled  [phone]

## 2016-04-26 ENCOUNTER — Encounter: Payer: Self-pay | Admitting: Family Medicine

## 2016-04-26 ENCOUNTER — Ambulatory Visit (INDEPENDENT_AMBULATORY_CARE_PROVIDER_SITE_OTHER): Payer: PPO | Admitting: Family Medicine

## 2016-04-26 VITALS — BP 138/82 | HR 80 | Temp 97.8°F | Resp 16 | Ht 72.0 in | Wt 198.0 lb

## 2016-04-26 DIAGNOSIS — F411 Generalized anxiety disorder: Secondary | ICD-10-CM | POA: Diagnosis not present

## 2016-04-26 DIAGNOSIS — I10 Essential (primary) hypertension: Secondary | ICD-10-CM | POA: Diagnosis not present

## 2016-04-26 DIAGNOSIS — Z1159 Encounter for screening for other viral diseases: Secondary | ICD-10-CM | POA: Diagnosis not present

## 2016-04-26 DIAGNOSIS — I2510 Atherosclerotic heart disease of native coronary artery without angina pectoris: Secondary | ICD-10-CM

## 2016-04-26 DIAGNOSIS — N183 Chronic kidney disease, stage 3 unspecified: Secondary | ICD-10-CM

## 2016-04-26 DIAGNOSIS — R0609 Other forms of dyspnea: Secondary | ICD-10-CM

## 2016-04-26 DIAGNOSIS — Z125 Encounter for screening for malignant neoplasm of prostate: Secondary | ICD-10-CM | POA: Diagnosis not present

## 2016-04-26 DIAGNOSIS — E785 Hyperlipidemia, unspecified: Secondary | ICD-10-CM | POA: Diagnosis not present

## 2016-04-26 LAB — CBC WITH DIFFERENTIAL/PLATELET
BASOS ABS: 60 {cells}/uL (ref 0–200)
BASOS PCT: 1 %
EOS ABS: 120 {cells}/uL (ref 15–500)
EOS PCT: 2 %
HCT: 45.3 % (ref 38.5–50.0)
Hemoglobin: 15.5 g/dL (ref 13.0–17.0)
LYMPHS PCT: 23 %
Lymphs Abs: 1380 cells/uL (ref 850–3900)
MCH: 31.1 pg (ref 27.0–33.0)
MCHC: 34.2 g/dL (ref 32.0–36.0)
MCV: 90.8 fL (ref 80.0–100.0)
MONOS PCT: 7 %
MPV: 9.9 fL (ref 7.5–12.5)
Monocytes Absolute: 420 cells/uL (ref 200–950)
NEUTROS ABS: 4020 {cells}/uL (ref 1500–7800)
Neutrophils Relative %: 67 %
PLATELETS: 231 10*3/uL (ref 140–400)
RBC: 4.99 MIL/uL (ref 4.20–5.80)
RDW: 13.4 % (ref 11.0–15.0)
WBC: 6 10*3/uL (ref 3.8–10.8)

## 2016-04-26 LAB — COMPLETE METABOLIC PANEL WITH GFR
ALT: 19 U/L (ref 9–46)
AST: 18 U/L (ref 10–35)
Albumin: 4.4 g/dL (ref 3.6–5.1)
Alkaline Phosphatase: 68 U/L (ref 40–115)
BILIRUBIN TOTAL: 0.6 mg/dL (ref 0.2–1.2)
BUN: 13 mg/dL (ref 7–25)
CHLORIDE: 105 mmol/L (ref 98–110)
CO2: 26 mmol/L (ref 20–31)
CREATININE: 1.43 mg/dL — AB (ref 0.70–1.33)
Calcium: 9.8 mg/dL (ref 8.6–10.3)
GFR, Est African American: 63 mL/min (ref >=60)
GFR, Est Non African American: 54 mL/min — ABNORMAL LOW (ref >=60)
GLUCOSE: 100 mg/dL — AB (ref 70–99)
Potassium: 5.5 mmol/L — ABNORMAL HIGH (ref 3.5–5.3)
SODIUM: 139 mmol/L (ref 135–146)
TOTAL PROTEIN: 7 g/dL (ref 6.1–8.1)

## 2016-04-26 LAB — LIPID PANEL
Cholesterol: 232 mg/dL — ABNORMAL HIGH (ref 125–200)
HDL: 45 mg/dL (ref >=40)
LDL Cholesterol: 158 mg/dL — ABNORMAL HIGH (ref <130)
Total CHOL/HDL Ratio: 5.2 Ratio — ABNORMAL HIGH (ref <=5.0)
Triglycerides: 145 mg/dL (ref <150)
VLDL: 29 mg/dL (ref <30)

## 2016-04-26 MED ORDER — ALPRAZOLAM 0.5 MG PO TABS: 0.5000 mg | ORAL_TABLET | Freq: Three times a day (TID) | ORAL | Status: DC | PRN

## 2016-04-26 MED ORDER — HYDROCODONE-ACETAMINOPHEN 5-325 MG PO TABS: 1.0000 | ORAL_TABLET | Freq: Three times a day (TID) | ORAL | Status: DC | PRN

## 2016-04-26 NOTE — Progress Notes (Signed)
Subjective:    Patient ID: Brandon Buck, male    DOB: [birth year], 55 y.o.   MRN: [id number]  HPI  I saw the patient in 2015 for dyspnea on exertion. At that time echocardiogram of the heart revealed normal systolic function. Pulmonary function test suggested COPD and we try the patient was previously which gave him minimal improvement. The dyspnea on exertion has steadily worsened. He now is also complaining of atypical chest pain unrelated to exertion. Past medical history is significant for coronary artery disease status post 2 stents. Repeat catheterization after the stent showed noncritical stenosis with the decision to manage medically. He has not seen his cardiologist in several years. His blood pressure today is acceptable. He denies any myalgias or right upper quadrant pain. He denies any angina. However he does report dyspnea with minimal exertion. He is due for hepatitis C screening as well as prostate cancer screening. He is also overdue for colonoscopy. Unfortunately his mother is dying from cancer and he is too busy now and he would like to defer the colonoscopy until after the situation has resolved Past Medical History  Diagnosis Date  . Heart disease   . Kidney disease   . High blood pressure   . Gout   . History of PTCA 2     2 stents   Past Surgical History  Procedure Laterality Date  . Hernia repair    . Hip surgery    . Kidney removed    . Heart catherization     Current Outpatient Prescriptions on File Prior to Visit  Medication Sig Dispense Refill  . allopurinol (ZYLOPRIM) 100 MG tablet TAKE 2 TABLETS BY MOUTH DAILY. 60 tablet 3  . ALPRAZolam (XANAX) 0.5 MG tablet Take 1 tablet (0.5 mg total) by mouth 3 (three) times daily as needed for anxiety. 30 tablet 0  . aspirin 81 MG tablet Take 81 mg by mouth daily.    Marland Kitchen atorvastatin (LIPITOR) 40 MG tablet TAKE 1 TABLET BY MOUTH ONCE DAILY AT BEDTIME. (Patient taking differently: TAKE 1 TABLET BY MOUTH ONCE DAILY.) 90  tablet 3  . clopidogrel (PLAVIX) 75 MG tablet Take 1 tablet (75 mg total) by mouth daily. 90 tablet 3  . diltiazem (CARDIZEM CD) 240 MG 24 hr capsule TAKE (1) CAPSULE BY MOUTH DAILY. 30 capsule 3  . doxycycline (VIBRA-TABS) 100 MG tablet Take 1 tablet (100 mg total) by mouth 2 (two) times daily. 20 tablet 0  . HYDROcodone-acetaminophen (NORCO/VICODIN) 5-325 MG tablet Take 1 tablet by mouth 3 (three) times daily as needed for moderate pain. 90 tablet 0  . indomethacin (INDOCIN) 25 MG capsule Take 25 mg by mouth daily as needed (gout).     . metoprolol (LOPRESSOR) 50 MG tablet TAKE 1/2 TABLET BY MOUTH TWICE A DAY. 30 tablet 5  . nitroGLYCERIN (NITROSTAT) 0.4 MG SL tablet Place 0.4 mg under the tongue every 5 (five) minutes as needed.    . pantoprazole (PROTONIX) 40 MG tablet TAKE ONE TABLET BY MOUTH TWICE DAILY. 60 tablet 11  . predniSONE (DELTASONE) 20 MG tablet Take 3 daily for 2 days, then 2 daily for 2 days, then 1 daily for 2 days. 12 tablet 0  . tiotropium (SPIRIVA HANDIHALER) 18 MCG inhalation capsule Place 1 capsule (18 mcg total) into inhaler and inhale daily. (Patient taking differently: Place 18 mcg into inhaler and inhale daily as needed (shortness of breath). ) 30 capsule 12   No current facility-administered medications on file prior  to visit.   Allergies  Allergen Reactions  . Keflex [Cephalexin] Rash   Social History   Social History  . Marital Status: Married    Spouse Name: N/A  . Number of Children: N/A  . Years of Education: N/A   Occupational History  . Not on file.   Social History Main Topics  . Smoking status: Former Research scientist (life sciences)  . Smokeless tobacco: Not on file  . Alcohol Use: Yes     Comment: occ  . Drug Use: No  . Sexual Activity: Not on file   Other Topics Concern  . Not on file   Social History Narrative      Review of Systems  All other systems reviewed and are negative.      Objective:   Physical Exam  Constitutional: He appears  well-developed and well-nourished. No distress.  HENT:  Right Ear: External ear normal.  Left Ear: External ear normal.  Nose: Nose normal.  Mouth/Throat: Oropharynx is clear and moist. No oropharyngeal exudate.  Eyes: Conjunctivae are normal. No scleral icterus.  Neck: Neck supple.  Cardiovascular: Normal rate, regular rhythm and normal heart sounds.   Pulmonary/Chest: Effort normal and breath sounds normal. No respiratory distress. He has no wheezes. He has no rales.  Abdominal: Soft. Bowel sounds are normal. He exhibits no distension. There is no tenderness. There is no rebound.  Lymphadenopathy:    He has no cervical adenopathy.  Skin: He is not diaphoretic.  Vitals reviewed.         Assessment & Plan:  CKD (chronic kidney disease), stage III  Essential hypertension  HLD (hyperlipidemia)  CAD in native artery - Plan: COMPLETE METABOLIC PANEL WITH GFR, Lipid panel, Ambulatory referral to Cardiology  Prostate cancer screening - Plan: PSA  Need for hepatitis C screening test - Plan: Hepatitis C Ab Reflex HCV RNA, QUANT  Anxiety state - Plan: ALPRAZolam (XANAX) 0.5 MG tablet, DISCONTINUED: ALPRAZolam (XANAX) 0.5 MG tablet  Dyspnea on exertion - Plan: Ambulatory referral to Cardiology  Blood pressures adequate. I will check renal function to monitor his chronic kidney disease. I will also check the patient for hepatitis C as well as prostate cancer. I will check a fasting lipid panel to monitor his LDL cholesterol. Goal LDL cholesterol is less than 70. I will schedule the patient to see his cardiologist. I believe he would benefit from a stress test to ensure that he does not have underlying ischemia causing his dyspnea on exertion. Patient also complains of right-sided hearing loss. Examination reveals a cerumen impaction in the right ear that was removed easily with irrigation and lavage

## 2016-04-27 LAB — PSA: PSA: 3.1 ng/mL (ref <=4.00)

## 2016-04-27 LAB — HEPATITIS C ANTIBODY: HCV AB: NEGATIVE

## 2016-05-03 ENCOUNTER — Other Ambulatory Visit: Payer: Self-pay | Admitting: Family Medicine

## 2016-05-03 MED ORDER — ROSUVASTATIN CALCIUM 40 MG PO TABS: 40.0000 mg | ORAL_TABLET | Freq: Every day | ORAL | Status: DC

## 2016-05-04 ENCOUNTER — Telehealth: Payer: Self-pay | Admitting: Family Medicine

## 2016-05-04 MED ORDER — ATORVASTATIN CALCIUM 80 MG PO TABS: 80.0000 mg | ORAL_TABLET | Freq: Every day | ORAL | Status: DC

## 2016-05-04 NOTE — Telephone Encounter (Signed)
-----   Message from [name], MD sent at 05/04/2016  2:55 PM EDT ----- Dc crestor and increase lipitor to 80 qday and watch diet.  Recheck in 3 months.   ----- Message -----    From: Alyson Locket    Sent: 05/04/2016   9:58 AM      To: [name], MD  Pt states that he went to get the crestor and it was a $30 copay and he can not afford that - wanted to know if you wanted to increase his lipitor or can he just watch his diet a little better and recheck in 3 months?

## 2016-05-04 NOTE — Telephone Encounter (Signed)
Patient aware of providers recommendations and med sent to pharm 

## 2016-05-14 ENCOUNTER — Encounter: Payer: Self-pay | Admitting: Cardiovascular Disease

## 2016-05-14 ENCOUNTER — Ambulatory Visit (INDEPENDENT_AMBULATORY_CARE_PROVIDER_SITE_OTHER): Payer: PPO | Admitting: Cardiovascular Disease

## 2016-05-14 ENCOUNTER — Encounter: Payer: Self-pay | Admitting: *Deleted

## 2016-05-14 VITALS — BP 158/84 | HR 64 | Ht 72.0 in | Wt 200.0 lb

## 2016-05-14 DIAGNOSIS — R079 Chest pain, unspecified: Secondary | ICD-10-CM | POA: Diagnosis not present

## 2016-05-14 DIAGNOSIS — R0609 Other forms of dyspnea: Secondary | ICD-10-CM | POA: Diagnosis not present

## 2016-05-14 DIAGNOSIS — I25118 Atherosclerotic heart disease of native coronary artery with other forms of angina pectoris: Secondary | ICD-10-CM

## 2016-05-14 DIAGNOSIS — Z955 Presence of coronary angioplasty implant and graft: Secondary | ICD-10-CM | POA: Diagnosis not present

## 2016-05-14 DIAGNOSIS — Z136 Encounter for screening for cardiovascular disorders: Secondary | ICD-10-CM | POA: Diagnosis not present

## 2016-05-14 DIAGNOSIS — I10 Essential (primary) hypertension: Secondary | ICD-10-CM

## 2016-05-14 NOTE — Patient Instructions (Signed)
Medication Instructions:  Continue all current medications.  Labwork: NONE  Testing/Procedures:  Your physician has requested that you have an exercise stress myoview. For further information please visit [email or web address]. Please follow instruction sheet, as given.  Office will contact with results via phone or letter.    Follow-Up: 3 weeks   Any Other Special Instructions Will Be Listed Below (If Applicable).  If you need a refill on your cardiac medications before your next appointment, please call your pharmacy.

## 2016-05-14 NOTE — Progress Notes (Signed)
Patient ID: Brandon Buck, male   DOB: May 21, 1961, 54 y.o.   MRN: [id number]       CARDIOLOGY CONSULT NOTE  Patient ID: Brandon Buck MRN: [id number] DOB/AGE: [birth year] 55 y.o.  Admit date: (Not on file) Primary Physician: Odette Fraction, MD Referring Physician:   Reason for Consultation: CAD, CP, DOE  HPI: The patient is a 55 year old male with a reported history of coronary artery disease and 2 previous stents. He has been complaining of exertional dyspnea and atypical chest pain unrelated to exertion. He underwent PFTs which suggested COPD but his exertional dyspnea has steadily gotten worse. I am able to locate a coronary angiography report dated 10/13/05 but there is no mention of percutaneous coronary intervention.  ECG performed in the office today which I personally interpreted demonstrated normal sinus rhythm with a mild nonspecific T wave abnormality.  He has been gradually getting more short of breath with exertion over the past year. After 100 yards he feels short of breath and sweats profusely. He has had chest pains and pain between his shoulder blades requiring one to two nitroglycerin tablets. He has occasional palpitations. He said he has taken several NTG tablets in the last few months along with aspirin. He said his stents were placed in 2004.  Echocardiogram 08/12/14 normal left ventricular systolic function and regional wall motion, EF 60-65%. Blood pressure 5/15 148/86. Says it is usually normal.   Allergies  Allergen Reactions  . Keflex [Cephalexin] Rash    Current Outpatient Prescriptions  Medication Sig Dispense Refill  . allopurinol (ZYLOPRIM) 100 MG tablet TAKE 2 TABLETS BY MOUTH DAILY. 60 tablet 3  . ALPRAZolam (XANAX) 0.5 MG tablet Take 1 tablet (0.5 mg total) by mouth 3 (three) times daily as needed for anxiety. 30 tablet 0  . aspirin 81 MG tablet Take 81 mg by mouth daily.    Marland Kitchen atorvastatin (LIPITOR) 80 MG tablet Take 1 tablet (80 mg total) by  mouth daily. 90 tablet 3  . clopidogrel (PLAVIX) 75 MG tablet Take 1 tablet (75 mg total) by mouth daily. 90 tablet 3  . diltiazem (CARDIZEM CD) 240 MG 24 hr capsule TAKE (1) CAPSULE BY MOUTH DAILY. 30 capsule 3  . HYDROcodone-acetaminophen (NORCO/VICODIN) 5-325 MG tablet Take 1 tablet by mouth 3 (three) times daily as needed for moderate pain. 90 tablet 0  . indomethacin (INDOCIN) 25 MG capsule Take 25 mg by mouth daily as needed (gout).     . metoprolol (LOPRESSOR) 50 MG tablet TAKE 1/2 TABLET BY MOUTH TWICE A DAY. 30 tablet 5  . nitroGLYCERIN (NITROSTAT) 0.4 MG SL tablet Place 0.4 mg under the tongue every 5 (five) minutes as needed.    . pantoprazole (PROTONIX) 40 MG tablet TAKE ONE TABLET BY MOUTH TWICE DAILY. 60 tablet 11  . tiotropium (SPIRIVA HANDIHALER) 18 MCG inhalation capsule Place 1 capsule (18 mcg total) into inhaler and inhale daily. (Patient taking differently: Place 18 mcg into inhaler and inhale daily as needed (shortness of breath). ) 30 capsule 12   No current facility-administered medications for this visit.    Past Medical History  Diagnosis Date  . Heart disease   . Kidney disease   . High blood pressure   . Gout   . History of PTCA 2     2 stents    Past Surgical History  Procedure Laterality Date  . Hernia repair    . Hip surgery    . Kidney removed    . Heart  catherization      Social History   Social History  . Marital Status: Married    Spouse Name: N/A  . Number of Children: N/A  . Years of Education: N/A   Occupational History  . Not on file.   Social History Main Topics  . Smoking status: Former Smoker -- 1.50 packs/day for 32 years    Types: Cigarettes    Start date: 01/26/1971    Quit date: 01/26/2003  . Smokeless tobacco: Never Used  . Alcohol Use: 0.0 oz/week    0 Standard drinks or equivalent per week     Comment: occ  . Drug Use: No  . Sexual Activity: Not on file   Other Topics Concern  . Not on file   Social History  Narrative     No family history of premature CAD in 1st degree relatives.  Prior to Admission medications   Medication Sig Start Date End Date Taking? Authorizing Provider  allopurinol (ZYLOPRIM) 100 MG tablet TAKE 2 TABLETS BY MOUTH DAILY. 01/03/15   [name], MD  ALPRAZolam Duanne Moron) 0.5 MG tablet Take 1 tablet (0.5 mg total) by mouth 3 (three) times daily as needed for anxiety. 04/26/16   [name], MD  aspirin 81 MG tablet Take 81 mg by mouth daily.    Historical Provider, MD  atorvastatin (LIPITOR) 80 MG tablet Take 1 tablet (80 mg total) by mouth daily. 05/04/16   [name], MD  clopidogrel (PLAVIX) 75 MG tablet Take 1 tablet (75 mg total) by mouth daily. 08/06/14   [name], MD  diltiazem (CARDIZEM CD) 240 MG 24 hr capsule TAKE (1) CAPSULE BY MOUTH DAILY. 04/30/15   [name], MD  HYDROcodone-acetaminophen (NORCO/VICODIN) 5-325 MG tablet Take 1 tablet by mouth 3 (three) times daily as needed for moderate pain. 04/26/16   [name], MD  indomethacin (INDOCIN) 25 MG capsule Take 25 mg by mouth daily as needed (gout).     Historical Provider, MD  metoprolol (LOPRESSOR) 50 MG tablet TAKE 1/2 TABLET BY MOUTH TWICE A DAY. 02/04/15   [name], MD  nitroGLYCERIN (NITROSTAT) 0.4 MG SL tablet Place 0.4 mg under the tongue every 5 (five) minutes as needed.    Historical Provider, MD  pantoprazole (PROTONIX) 40 MG tablet TAKE ONE TABLET BY MOUTH TWICE DAILY. 06/02/15   [name], MD  rosuvastatin (CRESTOR) 40 MG tablet Take 1 tablet (40 mg total) by mouth daily. 05/03/16   [name], MD  tiotropium (SPIRIVA HANDIHALER) 18 MCG inhalation capsule Place 1 capsule (18 mcg total) into inhaler and inhale daily. Patient taking differently: Place 18 mcg into inhaler and inhale daily as needed (shortness of breath).  08/14/14   [name], MD     Review of systems complete and found to be negative unless listed above in HPI     Physical  exam Blood pressure 158/84, pulse 64, height 6' (1.829 m), weight 200 lb (90.719 kg), SpO2 96 %. General: NAD Neck: No JVD, no thyromegaly or thyroid nodule.  Lungs: Clear to auscultation bilaterally with normal respiratory effort. CV: Nondisplaced PMI. Regular rate and rhythm, normal S1/S2, no S3/S4, no murmur.  No peripheral edema.  No carotid bruit.  Normal pedal pulses.  Abdomen: Soft, nontender, no hepatosplenomegaly, no distention.  Skin: Intact without lesions or rashes.  Neurologic: Alert and oriented x 3.  Psych: Normal affect. Extremities: No clubbing or cyanosis.  HEENT: Normal.   ECG:  Most recent ECG reviewed.  Labs:   Lab Results  Component Value Date   WBC 6.0 04/26/2016   HGB 15.5 04/26/2016   HCT 45.3 04/26/2016   MCV 90.8 04/26/2016   PLT 231 04/26/2016   No results for input(s): NA, K, CL, CO2, BUN, CREATININE, CALCIUM, PROT, BILITOT, ALKPHOS, ALT, AST, GLUCOSE in the last 168 hours.  Invalid input(s): LABALBU Lab Results  Component Value Date   CKTOTAL 65 07/01/2009   CKMB 1.1 07/01/2009   TROPONINI 0.01        NO INDICATION OF MYOCARDIAL INJURY. 07/01/2009    Lab Results  Component Value Date   CHOL 232* 04/26/2016   CHOL 160 08/12/2014   CHOL  07/01/2009    126        ATP III CLASSIFICATION:  <200     mg/dL   Desirable  200-239  mg/dL   Borderline High  >=240    mg/dL   High          Lab Results  Component Value Date   HDL 45 04/26/2016   HDL 52 08/12/2014   HDL 42 07/01/2009   Lab Results  Component Value Date   LDLCALC 158* 04/26/2016   LDLCALC 83 08/12/2014   LDLCALC  07/01/2009    62        Total Cholesterol/HDL:CHD Risk Coronary Heart Disease Risk Table                     Men   Women  1/2 Average Risk   3.4   3.3  Average Risk       5.0   4.4  2 X Average Risk   9.6   7.1  3 X Average Risk  23.4   11.0        Use the calculated Patient Ratio above and the CHD Risk Table to determine the patient's CHD Risk.        ATP  III CLASSIFICATION (LDL):  <100     mg/dL   Optimal  100-129  mg/dL   Near or Above                    Optimal  130-159  mg/dL   Borderline  160-189  mg/dL   High  >190     mg/dL   Very High   Lab Results  Component Value Date   TRIG 145 04/26/2016   TRIG 124 08/12/2014   TRIG 110 07/01/2009   Lab Results  Component Value Date   CHOLHDL 5.2* 04/26/2016   CHOLHDL 3.1 08/12/2014   CHOLHDL 3.0 07/01/2009   No results found for: LDLDIRECT       Studies: No results found.  ASSESSMENT AND PLAN:  1. DOE and chest pain in context of CAD and prior PCI: Will proceed with nuclear stress testing (exercise Myoview). Will hold metoprolol and diltiazem the morning of the study. Currently on aspirin, Lipitor, Plavix, metoprolol, and diltiazem. I would have a low threshold for proceeding with coronary angiography.  2. Essential HTN: Markedly elevated but reportedly normal on other PCP visits. Will monitor.  Dispo: fu 3 weeks.   Signed: Kate Sable, M.D., F.A.C.C.  05/14/2016, 8:57 AM

## 2016-05-25 ENCOUNTER — Encounter (HOSPITAL_COMMUNITY)
Admission: RE | Admit: 2016-05-25 | Discharge: 2016-05-25 | Disposition: A | Discharge status: Home / Self Care | Payer: PPO | Source: Ambulatory Visit | Attending: Cardiovascular Disease | Admitting: Cardiovascular Disease

## 2016-05-25 ENCOUNTER — Inpatient Hospital Stay (HOSPITAL_COMMUNITY): Admission: RE | Admit: 2016-05-25 | Payer: PPO | Source: Ambulatory Visit

## 2016-05-25 ENCOUNTER — Encounter (HOSPITAL_COMMUNITY): Payer: Self-pay

## 2016-05-25 DIAGNOSIS — R0609 Other forms of dyspnea: Secondary | ICD-10-CM | POA: Diagnosis not present

## 2016-05-25 DIAGNOSIS — R079 Chest pain, unspecified: Secondary | ICD-10-CM | POA: Diagnosis not present

## 2016-05-25 HISTORY — DX: Essential (primary) hypertension: I10

## 2016-05-25 HISTORY — DX: Malignant (primary) neoplasm, unspecified: C80.1

## 2016-05-25 HISTORY — DX: Disorder of kidney and ureter, unspecified: N28.9

## 2016-05-25 LAB — NM MYOCAR MULTI W/SPECT W/WALL MOTION / EF
CHL CUP NUCLEAR SDS: 1
CHL CUP NUCLEAR SRS: 3
CHL CUP RESTING HR STRESS: 63 {beats}/min
CSEPED: 6 min
CSEPEDS: 15 s
CSEPEW: 7 METS
CSEPPHR: 137 {beats}/min
LV dias vol: 52 mL (ref 62–150)
LVSYSVOL: 15 mL
MPHR: 164 {beats}/min
NUC STRESS TID: 0.92
Percent HR: 83 %
RATE: 0.29
RPE: 14
SSS: 4

## 2016-05-25 MED ORDER — REGADENOSON 0.4 MG/5ML IV SOLN
INTRAVENOUS | Status: AC
  Filled 2016-05-25: qty 5

## 2016-05-25 MED ORDER — TECHNETIUM TC 99M TETROFOSMIN IV KIT
30.0000 | PACK | Freq: Once | INTRAVENOUS | Status: AC | PRN
  Administered 2016-05-25: 30.4 via INTRAVENOUS

## 2016-05-25 MED ORDER — SODIUM CHLORIDE 0.9% FLUSH
INTRAVENOUS | Status: AC
  Administered 2016-05-25: 10 mL via INTRAVENOUS
  Filled 2016-05-25: qty 10

## 2016-05-25 MED ORDER — TECHNETIUM TC 99M TETROFOSMIN IV KIT
10.0000 | PACK | Freq: Once | INTRAVENOUS | Status: AC | PRN
  Administered 2016-05-25: 10.3 via INTRAVENOUS

## 2016-05-26 ENCOUNTER — Telehealth: Payer: Self-pay | Admitting: Family Medicine

## 2016-05-26 DIAGNOSIS — F411 Generalized anxiety disorder: Secondary | ICD-10-CM

## 2016-05-26 MED ORDER — ALPRAZOLAM 0.5 MG PO TABS: 0.5000 mg | ORAL_TABLET | Freq: Three times a day (TID) | ORAL | 0 refills | Status: DC | PRN

## 2016-05-26 MED ORDER — HYDROCODONE-ACETAMINOPHEN 5-325 MG PO TABS: 1.0000 | ORAL_TABLET | Freq: Three times a day (TID) | ORAL | 0 refills | Status: DC | PRN

## 2016-05-26 NOTE — Telephone Encounter (Signed)
Requesting refill on Xanax and Hydrocodone - Ok to refill??

## 2016-05-27 ENCOUNTER — Telehealth: Payer: Self-pay | Admitting: Family Medicine

## 2016-05-27 ENCOUNTER — Telehealth: Payer: Self-pay | Admitting: *Deleted

## 2016-05-27 NOTE — Telephone Encounter (Signed)
Auth # [id number] from [facility] for Dr Bronson Ing fro Dx I25.10, R06.09  6 visits  05/14/16 - 11/10/16  Cardiology

## 2016-05-27 NOTE — Telephone Encounter (Signed)
RX printed, left up front and patient aware to pick up after 2  

## 2016-05-27 NOTE — Telephone Encounter (Signed)
-----   Message from Drema Dallas, [place] sent at 05/25/2016  4:02 PM EDT ----- Brandon Buck pt.  ----- Message ----- From: Herminio Commons, MD Sent: 05/25/2016   3:05 PM To: Drema Dallas, CMA  Low risk.

## 2016-05-27 NOTE — Telephone Encounter (Signed)
ok 

## 2016-05-27 NOTE — Telephone Encounter (Signed)
Notes Recorded by Laurine Blazer, LPN on [id number] at [phone] PM EDT Left message to return call.

## 2016-05-28 NOTE — Telephone Encounter (Signed)
Patient's mom passed last night.  He said that you can try him at home until 8:30. If after 8:30 call his cellphone after 10:00

## 2016-05-28 NOTE — Telephone Encounter (Signed)
Notes Recorded by Laurine Blazer, LPN on [id number] at [id number] PM EDT Patient notified. Copy to pmd. Follow up scheduled for 07/09/2016 with Dr. Bronson Ing.

## 2016-05-29 ENCOUNTER — Other Ambulatory Visit: Payer: Self-pay | Admitting: Family Medicine

## 2016-06-08 ENCOUNTER — Other Ambulatory Visit: Payer: Self-pay | Admitting: Family Medicine

## 2016-06-29 ENCOUNTER — Other Ambulatory Visit: Payer: Self-pay | Admitting: Family Medicine

## 2016-06-29 DIAGNOSIS — F411 Generalized anxiety disorder: Secondary | ICD-10-CM

## 2016-06-30 MED ORDER — HYDROCODONE-ACETAMINOPHEN 5-325 MG PO TABS: 1.0000 | ORAL_TABLET | Freq: Three times a day (TID) | ORAL | 0 refills | Status: DC | PRN

## 2016-06-30 MED ORDER — ALPRAZOLAM 0.5 MG PO TABS
0.5000 mg | ORAL_TABLET | Freq: Three times a day (TID) | ORAL | 0 refills | Status: DC | PRN
Start: 2016-06-30 — End: 2016-07-30

## 2016-06-30 NOTE — Telephone Encounter (Signed)
Ok to refill 

## 2016-07-01 NOTE — Telephone Encounter (Signed)
ok 

## 2016-07-02 ENCOUNTER — Other Ambulatory Visit: Payer: Self-pay | Admitting: Family Medicine

## 2016-07-08 ENCOUNTER — Encounter: Payer: Self-pay | Admitting: *Deleted

## 2016-07-09 ENCOUNTER — Ambulatory Visit: Payer: PPO | Admitting: Cardiovascular Disease

## 2016-07-23 ENCOUNTER — Encounter: Payer: Self-pay | Admitting: Cardiovascular Disease

## 2016-07-23 ENCOUNTER — Ambulatory Visit (INDEPENDENT_AMBULATORY_CARE_PROVIDER_SITE_OTHER): Payer: PPO | Admitting: Cardiovascular Disease

## 2016-07-23 VITALS — BP 124/64 | HR 72 | Ht 72.0 in | Wt 198.0 lb

## 2016-07-23 DIAGNOSIS — R079 Chest pain, unspecified: Secondary | ICD-10-CM | POA: Diagnosis not present

## 2016-07-23 DIAGNOSIS — I10 Essential (primary) hypertension: Secondary | ICD-10-CM

## 2016-07-23 DIAGNOSIS — R0609 Other forms of dyspnea: Secondary | ICD-10-CM

## 2016-07-23 DIAGNOSIS — Z955 Presence of coronary angioplasty implant and graft: Secondary | ICD-10-CM | POA: Diagnosis not present

## 2016-07-23 DIAGNOSIS — I25118 Atherosclerotic heart disease of native coronary artery with other forms of angina pectoris: Secondary | ICD-10-CM

## 2016-07-23 NOTE — Patient Instructions (Signed)
Your physician wants you to follow-up in: 6 months You will receive a reminder letter in the mail two months in advance. If you don't receive a letter, please call our office to schedule the follow-up appointment.     Your physician recommends that you continue on your current medications as directed. Please refer to the Current Medication list given to you today.      Thank you for choosing [facility] !        

## 2016-07-23 NOTE — Progress Notes (Signed)
SUBJECTIVE: The patient returns for follow-up after undergoing cardiovascular testing performed for the evaluation of chest pain. Low risk nuclear stress test 05/25/16, LVEF 71%. There was a hypertensive response to exercise.  He did not get short of breath when walking on the treadmill during stress testing. He does not get short of breath when walking on level ground. He said this only happens if he is weed eating or washing his car.   Review of Systems: As per "subjective", otherwise negative.  Allergies  Allergen Reactions  . Keflex [Cephalexin] Rash    Current Outpatient Prescriptions  Medication Sig Dispense Refill  . allopurinol (ZYLOPRIM) 100 MG tablet TAKE 2 TABLETS BY MOUTH DAILY. 60 tablet 3  . ALPRAZolam (XANAX) 0.5 MG tablet Take 1 tablet (0.5 mg total) by mouth 3 (three) times daily as needed for anxiety. 30 tablet 0  . aspirin 81 MG tablet Take 81 mg by mouth daily.    Marland Kitchen atorvastatin (LIPITOR) 80 MG tablet Take 1 tablet (80 mg total) by mouth daily. 90 tablet 3  . clopidogrel (PLAVIX) 75 MG tablet TAKE 1 TABLET BY MOUTH ONCE A DAY. 90 tablet 0  . diltiazem (CARDIZEM CD) 240 MG 24 hr capsule TAKE (1) CAPSULE BY MOUTH DAILY. 30 capsule 11  . HYDROcodone-acetaminophen (NORCO/VICODIN) 5-325 MG tablet Take 1 tablet by mouth 3 (three) times daily as needed for moderate pain. 90 tablet 0  . indomethacin (INDOCIN) 25 MG capsule Take 25 mg by mouth daily as needed (gout).     . metoprolol (LOPRESSOR) 50 MG tablet TAKE 1/2 TABLET BY MOUTH TWICE A DAY. 30 tablet 5  . nitroGLYCERIN (NITROSTAT) 0.4 MG SL tablet Place 0.4 mg under the tongue every 5 (five) minutes as needed.    . pantoprazole (PROTONIX) 40 MG tablet TAKE ONE TABLET BY MOUTH TWICE DAILY. 60 tablet 11  . tiotropium (SPIRIVA HANDIHALER) 18 MCG inhalation capsule Place 1 capsule (18 mcg total) into inhaler and inhale daily. (Patient taking differently: Place 18 mcg into inhaler and inhale daily as needed (shortness of  breath). ) 30 capsule 12   No current facility-administered medications for this visit.     Past Medical History:  Diagnosis Date  . Cancer ([place])    Kidney  . Gout   . Heart disease   . High blood pressure   . History of PTCA 2    2 stents  . Hypertension   . Kidney disease   . Renal insufficiency     Past Surgical History:  Procedure Laterality Date  . Heart catherization    . HERNIA REPAIR    . HIP SURGERY    . Kidney removed      Social History   Social History  . Marital status: Married    Spouse name: N/A  . Number of children: N/A  . Years of education: N/A   Occupational History  . Not on file.   Social History Main Topics  . Smoking status: Former Smoker    Packs/day: 1.50    Years: 32.00    Types: Cigarettes    Start date: 01/26/1971    Quit date: 01/26/2003  . Smokeless tobacco: Never Used  . Alcohol use 0.0 oz/week     Comment: occ  . Drug use: No  . Sexual activity: Not on file   Other Topics Concern  . Not on file   Social History Narrative  . No narrative on file     Vitals:  07/23/16 1344  BP: 124/64  Pulse: 72  SpO2: 96%  Weight: 198 lb (89.8 kg)  Height: 6' (1.829 m)    PHYSICAL EXAM General: NAD HEENT: Normal. Neck: No JVD, no thyromegaly. Lungs: Clear to auscultation bilaterally with normal respiratory effort. CV: Nondisplaced PMI.  Regular rate and rhythm, normal S1/S2, no S3/S4, no murmur. No pretibial or periankle edema.     Abdomen: Soft, nontender, no distention.  Neurologic: Alert and oriented.  Psych: Normal affect. Skin: Normal. Musculoskeletal: No gross deformities.    ECG: Most recent ECG reviewed.      ASSESSMENT AND PLAN: 1. DOE and chest pain in context of CAD and prior PCI: Low risk nuclear MPI study with normal LVEF. Currently on aspirin, Lipitor, Plavix, metoprolol, and diltiazem. No changes.  2. Essential HTN: Controlled. No changes.  Dispo: fu 6 months.   Kate Sable, M.D.,  F.A.C.C.

## 2016-07-30 ENCOUNTER — Ambulatory Visit: Payer: PPO | Admitting: Cardiovascular Disease

## 2016-07-30 ENCOUNTER — Encounter: Payer: Self-pay | Admitting: Family Medicine

## 2016-07-30 ENCOUNTER — Ambulatory Visit (INDEPENDENT_AMBULATORY_CARE_PROVIDER_SITE_OTHER): Payer: PPO | Admitting: Family Medicine

## 2016-07-30 VITALS — BP 140/80 | HR 80 | Temp 98.2°F | Resp 16 | Ht 72.0 in | Wt 201.0 lb

## 2016-07-30 DIAGNOSIS — N183 Chronic kidney disease, stage 3 unspecified: Secondary | ICD-10-CM

## 2016-07-30 DIAGNOSIS — E7800 Pure hypercholesterolemia, unspecified: Secondary | ICD-10-CM | POA: Diagnosis not present

## 2016-07-30 DIAGNOSIS — I2510 Atherosclerotic heart disease of native coronary artery without angina pectoris: Secondary | ICD-10-CM | POA: Diagnosis not present

## 2016-07-30 DIAGNOSIS — I10 Essential (primary) hypertension: Secondary | ICD-10-CM

## 2016-07-30 DIAGNOSIS — F411 Generalized anxiety disorder: Secondary | ICD-10-CM | POA: Diagnosis not present

## 2016-07-30 DIAGNOSIS — M8700 Idiopathic aseptic necrosis of unspecified bone: Secondary | ICD-10-CM | POA: Diagnosis not present

## 2016-07-30 LAB — CBC WITH DIFFERENTIAL/PLATELET
BASOS PCT: 1 %
Basophils Absolute: 75 cells/uL (ref 0–200)
EOS ABS: 150 {cells}/uL (ref 15–500)
EOS PCT: 2 %
HCT: 45 % (ref 38.5–50.0)
Hemoglobin: 15.1 g/dL (ref 13.0–17.0)
LYMPHS PCT: 25 %
Lymphs Abs: 1875 cells/uL (ref 850–3900)
MCH: 30.5 pg (ref 27.0–33.0)
MCHC: 33.6 g/dL (ref 32.0–36.0)
MCV: 90.9 fL (ref 80.0–100.0)
MONOS PCT: 8 %
MPV: 9.3 fL (ref 7.5–12.5)
Monocytes Absolute: 600 cells/uL (ref 200–950)
Neutro Abs: 4800 cells/uL (ref 1500–7800)
Neutrophils Relative %: 64 %
PLATELETS: 225 10*3/uL (ref 140–400)
RBC: 4.95 MIL/uL (ref 4.20–5.80)
RDW: 13.5 % (ref 11.0–15.0)
WBC: 7.5 10*3/uL (ref 3.8–10.8)

## 2016-07-30 LAB — LIPID PANEL
CHOL/HDL RATIO: 3 ratio (ref <=5.0)
Cholesterol: 167 mg/dL (ref 125–200)
HDL: 56 mg/dL (ref >=40)
LDL CALC: 88 mg/dL (ref <130)
TRIGLYCERIDES: 113 mg/dL (ref <150)
VLDL: 23 mg/dL (ref <30)

## 2016-07-30 LAB — COMPLETE METABOLIC PANEL WITH GFR
ALT: 19 U/L (ref 9–46)
AST: 22 U/L (ref 10–35)
Albumin: 4.1 g/dL (ref 3.6–5.1)
Alkaline Phosphatase: 74 U/L (ref 40–115)
BUN: 16 mg/dL (ref 7–25)
CHLORIDE: 102 mmol/L (ref 98–110)
CO2: 27 mmol/L (ref 20–31)
Calcium: 9.6 mg/dL (ref 8.6–10.3)
Creat: 1.67 mg/dL — ABNORMAL HIGH (ref 0.70–1.33)
GFR, EST AFRICAN AMERICAN: 52 mL/min — AB (ref >=60)
GFR, EST NON AFRICAN AMERICAN: 45 mL/min — AB (ref >=60)
Glucose, Bld: 93 mg/dL (ref 70–99)
Potassium: 5 mmol/L (ref 3.5–5.3)
SODIUM: 139 mmol/L (ref 135–146)
Total Bilirubin: 0.7 mg/dL (ref 0.2–1.2)
Total Protein: 6.8 g/dL (ref 6.1–8.1)

## 2016-07-30 MED ORDER — ALPRAZOLAM 0.5 MG PO TABS: 0.5000 mg | ORAL_TABLET | Freq: Three times a day (TID) | ORAL | 0 refills | Status: DC | PRN

## 2016-07-30 MED ORDER — HYDROCODONE-ACETAMINOPHEN 5-325 MG PO TABS: 1.0000 | ORAL_TABLET | Freq: Three times a day (TID) | ORAL | 0 refills | Status: DC | PRN

## 2016-07-30 MED ORDER — HYDROCODONE-ACETAMINOPHEN 5-325 MG PO TABS
1.0000 | ORAL_TABLET | Freq: Three times a day (TID) | ORAL | 0 refills | Status: DC | PRN
Start: 2016-07-30 — End: 2016-07-30

## 2016-07-30 NOTE — Progress Notes (Signed)
Subjective:    Patient ID: Brandon Buck, male    DOB: [birth year], 55 y.o.   MRN: [id number]  HPI  04/26/16 I saw the patient in 2015 for dyspnea on exertion. At that time echocardiogram of the heart revealed normal systolic function. Pulmonary function test suggested COPD and we try the patient was previously which gave him minimal improvement. The dyspnea on exertion has steadily worsened. He now is also complaining of atypical chest pain unrelated to exertion. Past medical history is significant for coronary artery disease status post 2 stents. Repeat catheterization after the stent showed noncritical stenosis with the decision to manage medically. He has not seen his cardiologist in several years. His blood pressure today is acceptable. He denies any myalgias or right upper quadrant pain. He denies any angina. However he does report dyspnea with minimal exertion. He is due for hepatitis C screening as well as prostate cancer screening. He is also overdue for colonoscopy. Unfortunately his mother is dying from cancer and he is too busy now and he would like to defer the colonoscopy until after the situation has resolved.  At that time, my plan was: Blood pressure is adequate. I will check renal function to monitor his chronic kidney disease. I will also check the patient for hepatitis C as well as prostate cancer. I will check a fasting lipid panel to monitor his LDL cholesterol. Goal LDL cholesterol is less than 70. I will schedule the patient to see his cardiologist. I believe he would benefit from a stress test to ensure that he does not have underlying ischemia causing his dyspnea on exertion. Patient also complains of right-sided hearing loss. Examination reveals a cerumen impaction in the right ear that was removed easily with irrigation and lavage  07/30/16 For documentation purposes, the patient is on hydrocodone 1 tablet 3 times a day chronically for bilateral hip pain. He has a history of  bilateral AVN in both hips. Most recent imaging of the hip was performed with an MRI in 2008 and his results are documented below: 1.  No AVN. 2.  Post procedural changes in the left femoral head and neck, likely representing either decompressive osteotomy for prior AVN or revascularization with fibular bone graft.   3.  The bony changes in the left femoral head and neck are unchanged dating back to a comparison CT of 06/08/04.  He is unable to take NSAIDs due to his chronic kidney disease and Tylenol has been unable to control his pain. There is no evidence of abuse or diversion. The patient safely fills his prescription once a month. He never tries to fill his prescriptions early, he never loses his prescriptions, and he demonstrates no warning signs for abuse or diversion.  LDL at visit in July was 158. He is here for recheck.  We increased his Lipitor to 80 mg a day.  Furthermore he is tried harder on his diet to avoid saturated fat. He is not able to exercise because of his hip pain. He is interested in rechecking his cholesterol today  Past Medical History:  Diagnosis Date  . Cancer ([place])    Kidney  . Gout   . Heart disease   . High blood pressure   . History of PTCA 2    2 stents  . Hypertension   . Kidney disease   . Renal insufficiency    Past Surgical History:  Procedure Laterality Date  . Heart catherization    . HERNIA REPAIR    .  HIP SURGERY    . Kidney removed     Current Outpatient Prescriptions on File Prior to Visit  Medication Sig Dispense Refill  . allopurinol (ZYLOPRIM) 100 MG tablet TAKE 2 TABLETS BY MOUTH DAILY. 60 tablet 3  . ALPRAZolam (XANAX) 0.5 MG tablet Take 1 tablet (0.5 mg total) by mouth 3 (three) times daily as needed for anxiety. 30 tablet 0  . aspirin 81 MG tablet Take 81 mg by mouth daily.    Marland Kitchen atorvastatin (LIPITOR) 80 MG tablet Take 1 tablet (80 mg total) by mouth daily. 90 tablet 3  . clopidogrel (PLAVIX) 75 MG tablet TAKE 1 TABLET BY MOUTH ONCE  A DAY. 90 tablet 0  . diltiazem (CARDIZEM CD) 240 MG 24 hr capsule TAKE (1) CAPSULE BY MOUTH DAILY. 30 capsule 11  . HYDROcodone-acetaminophen (NORCO/VICODIN) 5-325 MG tablet Take 1 tablet by mouth 3 (three) times daily as needed for moderate pain. 90 tablet 0  . indomethacin (INDOCIN) 25 MG capsule Take 25 mg by mouth daily as needed (gout).     . metoprolol (LOPRESSOR) 50 MG tablet TAKE 1/2 TABLET BY MOUTH TWICE A DAY. 30 tablet 5  . nitroGLYCERIN (NITROSTAT) 0.4 MG SL tablet Place 0.4 mg under the tongue every 5 (five) minutes as needed.    . pantoprazole (PROTONIX) 40 MG tablet TAKE ONE TABLET BY MOUTH TWICE DAILY. 60 tablet 11  . tiotropium (SPIRIVA HANDIHALER) 18 MCG inhalation capsule Place 1 capsule (18 mcg total) into inhaler and inhale daily. (Patient taking differently: Place 18 mcg into inhaler and inhale daily as needed (shortness of breath). ) 30 capsule 12   No current facility-administered medications on file prior to visit.    Allergies  Allergen Reactions  . Keflex [Cephalexin] Rash   Social History   Social History  . Marital status: Married    Spouse name: N/A  . Number of children: N/A  . Years of education: N/A   Occupational History  . Not on file.   Social History Main Topics  . Smoking status: Former Smoker    Packs/day: 1.50    Years: 32.00    Types: Cigarettes    Start date: 01/26/1971    Quit date: 01/26/2003  . Smokeless tobacco: Never Used  . Alcohol use 0.0 oz/week     Comment: occ  . Drug use: No  . Sexual activity: Not on file   Other Topics Concern  . Not on file   Social History Narrative  . No narrative on file      Review of Systems  All other systems reviewed and are negative.      Objective:   Physical Exam  Constitutional: He appears well-developed and well-nourished. No distress.  HENT:  Right Ear: External ear normal.  Left Ear: External ear normal.  Nose: Nose normal.  Mouth/Throat: Oropharynx is clear and moist. No  oropharyngeal exudate.  Eyes: Conjunctivae are normal. No scleral icterus.  Neck: Neck supple.  Cardiovascular: Normal rate, regular rhythm and normal heart sounds.   Pulmonary/Chest: Effort normal and breath sounds normal. No respiratory distress. He has no wheezes. He has no rales.  Abdominal: Soft. Bowel sounds are normal. He exhibits no distension. There is no tenderness. There is no rebound.  Lymphadenopathy:    He has no cervical adenopathy.  Skin: He is not diaphoretic.  Vitals reviewed.         Assessment & Plan:  CKD (chronic kidney disease), stage III  Essential hypertension  CAD in native  artery - Plan: CBC with Differential/Platelet, COMPLETE METABOLIC PANEL WITH GFR, Lipid panel  Pure hypercholesterolemia - Plan: CBC with Differential/Platelet, COMPLETE METABOLIC PANEL WITH GFR, Lipid panel  AVN (avascular necrosis of bone) (HCC) - Plan: Drug Screen, Urine  Anxiety state - Plan: ALPRAZolam (XANAX) 0.5 MG tablet, DISCONTINUED: ALPRAZolam (XANAX) 0.5 MG tablet, DISCONTINUED: ALPRAZolam (XANAX) 0.5 MG tablet  Blood pressures borderline. Recently had a stress test and everything came back normal. Check a fasting lipid panel. Goal LDL is less than 70. If LDL is not at goal, I will add zetia to his Lipitor.  Patient received his flu shot today. Also refill his hydrocodone and his Xanax

## 2016-08-20 ENCOUNTER — Other Ambulatory Visit: Payer: Self-pay | Admitting: Family Medicine

## 2016-09-10 ENCOUNTER — Other Ambulatory Visit: Payer: Self-pay | Admitting: Family Medicine

## 2016-09-10 MED ORDER — NICOTINE 10 MG IN INHA: 1.0000 | RESPIRATORY_TRACT | 3 refills | Status: DC | PRN

## 2016-10-17 IMAGING — NM NM MYOCAR MULTI W/SPECT W/WALL MOTION & EF
2 series · 12 of 12 positions shown · non-contrast
Comparison: none

[Series 1: rest · 8.28mm/px · 6 of 64 frames shown]
[frame 6/64]
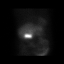
[frame 16/64]
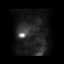
[frame 27/64]
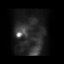
[frame 38/64]
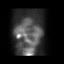
[frame 48/64]
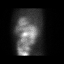
[frame 59/64]
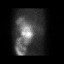

[Series 2: stress gated · 8.28mm/px · 6 of 64 frames shown]
[frame 6/64]
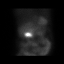
[frame 16/64]
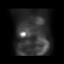
[frame 27/64]
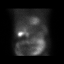
[frame 38/64]
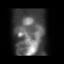
[frame 48/64]
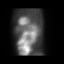
[frame 59/64]
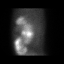

[12 of 12 positions shown; findings below may reference images not displayed]

Canned report from images found in remote index.

Refer to host system for actual result text.

## 2016-10-29 ENCOUNTER — Telehealth: Payer: Self-pay | Admitting: *Deleted

## 2016-10-29 DIAGNOSIS — F411 Generalized anxiety disorder: Secondary | ICD-10-CM

## 2016-10-29 NOTE — Telephone Encounter (Signed)
ok 

## 2016-10-29 NOTE — Telephone Encounter (Signed)
Patient returned call.   Reports that he requires refill on Xanax and Hydrocodone.   Ok to refill??

## 2016-10-29 NOTE — Telephone Encounter (Signed)
Received VM from patient.   Requested refill on medication, but did not leave the name.   Call placed to patient to inquire. [place].

## 2016-11-01 MED ORDER — ALPRAZOLAM 0.5 MG PO TABS: 0.5000 mg | ORAL_TABLET | Freq: Three times a day (TID) | ORAL | 0 refills | Status: DC | PRN

## 2016-11-01 MED ORDER — HYDROCODONE-ACETAMINOPHEN 5-325 MG PO TABS: 1.0000 | ORAL_TABLET | Freq: Three times a day (TID) | ORAL | 0 refills | Status: DC | PRN

## 2016-11-01 NOTE — Telephone Encounter (Signed)
RX printed x 3 months, left up front and patient aware to pick up via vm 

## 2017-01-28 ENCOUNTER — Other Ambulatory Visit: Payer: Self-pay | Admitting: Family Medicine

## 2017-01-28 DIAGNOSIS — F411 Generalized anxiety disorder: Secondary | ICD-10-CM

## 2017-01-28 NOTE — Telephone Encounter (Signed)
Pt needs refill on xanax, and vicodin.

## 2017-01-28 NOTE — Telephone Encounter (Signed)
Both refilled on 12/30/16 for 1 month  LOV 07/30/16  OK refill?

## 2017-01-31 MED ORDER — ALPRAZOLAM 0.5 MG PO TABS: 0.5000 mg | ORAL_TABLET | Freq: Three times a day (TID) | ORAL | 0 refills | Status: DC | PRN

## 2017-01-31 MED ORDER — HYDROCODONE-ACETAMINOPHEN 5-325 MG PO TABS: 1.0000 | ORAL_TABLET | Freq: Three times a day (TID) | ORAL | 0 refills | Status: DC | PRN

## 2017-01-31 NOTE — Telephone Encounter (Signed)
Rx printed, pt made aware

## 2017-01-31 NOTE — Telephone Encounter (Signed)
ok 

## 2017-03-01 ENCOUNTER — Telehealth: Payer: Self-pay | Admitting: Family Medicine

## 2017-03-01 MED ORDER — HYDROCODONE-ACETAMINOPHEN 5-325 MG PO TABS: 1.0000 | ORAL_TABLET | Freq: Three times a day (TID) | ORAL | 0 refills | Status: DC | PRN

## 2017-03-01 NOTE — Telephone Encounter (Signed)
RX printed, left up front and patient aware to pick up via vm and to schedule appt

## 2017-03-01 NOTE — Telephone Encounter (Signed)
[place] for 1 month.  Due for OV q 6 months

## 2017-03-01 NOTE — Telephone Encounter (Signed)
Patient is requesting a refill on Hydrocodone x 3 months - Ok to refill??

## 2017-03-02 MED ORDER — ALPRAZOLAM 0.5 MG PO TABS: 0.5000 mg | ORAL_TABLET | Freq: Three times a day (TID) | ORAL | 0 refills | Status: DC | PRN

## 2017-03-10 ENCOUNTER — Ambulatory Visit (INDEPENDENT_AMBULATORY_CARE_PROVIDER_SITE_OTHER): Payer: PPO | Admitting: Orthopaedic Surgery

## 2017-03-10 ENCOUNTER — Ambulatory Visit (INDEPENDENT_AMBULATORY_CARE_PROVIDER_SITE_OTHER): Payer: PPO

## 2017-03-10 VITALS — BP 182/96 | HR 88 | Ht 71.5 in | Wt 200.0 lb

## 2017-03-10 DIAGNOSIS — M25521 Pain in right elbow: Secondary | ICD-10-CM | POA: Diagnosis not present

## 2017-03-10 NOTE — Progress Notes (Signed)
Subjective:    Patient ID: Brandon Buck, male    DOB: [birth year], 56 y.o.   MRN: [id number]  HPI He has had right elbow pain laterally for about three weeks.  It is getting worse.  He denies any trauma.  He has pain lifting things and now has pain lifting just a liter of a drink.  He has no numbness, no redness.  He has no pain with just motion but pain when lifting things.  He has tried rest, ice, Aleve, Advil and some heat with no help.  He has no neck or shoulder pain.  He has no other joint pains.   Review of Systems  HENT: Negative for congestion.   Respiratory: Negative for shortness of breath.   Cardiovascular: Negative for chest pain and leg swelling.  Endocrine: Negative for cold intolerance.  Musculoskeletal: Positive for arthralgias and joint swelling.  Allergic/Immunologic: Negative for environmental allergies.   Past Medical History:  Diagnosis Date  . Cancer ([place])    Kidney  . Gout   . Heart disease   . High blood pressure   . History of PTCA 2    2 stents  . Hypertension   . Kidney disease   . Renal insufficiency     Past Surgical History:  Procedure Laterality Date  . Heart catherization    . HERNIA REPAIR    . HIP SURGERY    . Kidney removed      Current Outpatient Prescriptions on File Prior to Visit  Medication Sig Dispense Refill  . allopurinol (ZYLOPRIM) 100 MG tablet TAKE 2 TABLETS BY MOUTH DAILY. 60 tablet 0  . ALPRAZolam (XANAX) 0.5 MG tablet Take 1 tablet (0.5 mg total) by mouth 3 (three) times daily as needed for anxiety. 30 tablet 0  . aspirin 81 MG tablet Take 81 mg by mouth daily.    Marland Kitchen atorvastatin (LIPITOR) 80 MG tablet Take 1 tablet (80 mg total) by mouth daily. 90 tablet 3  . clopidogrel (PLAVIX) 75 MG tablet TAKE 1 TABLET BY MOUTH ONCE A DAY. 90 tablet 0  . diltiazem (CARDIZEM CD) 240 MG 24 hr capsule TAKE (1) CAPSULE BY MOUTH DAILY. 30 capsule 11  . HYDROcodone-acetaminophen (NORCO/VICODIN) 5-325 MG tablet Take 1 tablet by mouth 3  (three) times daily as needed for moderate pain. 90 tablet 0  . indomethacin (INDOCIN) 25 MG capsule Take 25 mg by mouth daily as needed (gout).     . metoprolol (LOPRESSOR) 50 MG tablet TAKE 1/2 TABLET BY MOUTH TWICE A DAY. 30 tablet 5  . nicotine (NICOTROL) 10 MG inhaler Inhale 1 Cartridge (1 continuous puffing total) into the lungs as needed for smoking cessation. 168 each 3  . nitroGLYCERIN (NITROSTAT) 0.4 MG SL tablet Place 0.4 mg under the tongue every 5 (five) minutes as needed.    . pantoprazole (PROTONIX) 40 MG tablet TAKE ONE TABLET BY MOUTH TWICE DAILY. 60 tablet 11  . tiotropium (SPIRIVA HANDIHALER) 18 MCG inhalation capsule Place 1 capsule (18 mcg total) into inhaler and inhale daily. (Patient taking differently: Place 18 mcg into inhaler and inhale daily as needed (shortness of breath). ) 30 capsule 12   No current facility-administered medications on file prior to visit.     Social History   Social History  . Marital status: Married    Spouse name: N/A  . Number of children: N/A  . Years of education: N/A   Occupational History  . Not on file.   Social History  Main Topics  . Smoking status: Former Smoker    Packs/day: 1.50    Years: 32.00    Types: Cigarettes    Start date: 01/26/1971    Quit date: 01/26/2003  . Smokeless tobacco: Never Used  . Alcohol use 0.0 oz/week     Comment: occ  . Drug use: No  . Sexual activity: Not on file   Other Topics Concern  . Not on file   Social History Narrative  . No narrative on file    Family History  Problem Relation Age of Onset  . Heart disease Unknown   . Cancer Unknown   . Kidney disease Unknown     BP (!) 182/96   Pulse 88   Ht 5' 11.5" (1.816 m)   Wt 200 lb (90.7 kg)   BMI 27.51 kg/m      Objective:   Physical Exam  Constitutional: He is oriented to person, place, and time. He appears well-developed and well-nourished.  HENT:  Head: Normocephalic and atraumatic.  Eyes: Conjunctivae and EOM are normal.  Pupils are equal, round, and reactive to light.  Neck: Normal range of motion. Neck supple.  Cardiovascular: Normal rate, regular rhythm and intact distal pulses.   Pulmonary/Chest: Effort normal.  Abdominal: Soft.  Musculoskeletal: He exhibits tenderness (Pain lateral epicondyle right elbow with some swelling more to center.  Pain to resisted extension wrist and of long finger.  NV intact.  ROM full.  Neck negative.  Left shoulder normal.).  Neurological: He is alert and oriented to person, place, and time. He has normal reflexes. He displays normal reflexes. No cranial nerve deficit. He exhibits normal muscle tone. Coordination normal.  Skin: Skin is warm and dry.  Psychiatric: He has a normal mood and affect. His behavior is normal. Judgment and thought content normal.  Vitals reviewed.   X-rays were done of the right elbow, reported separately.     Assessment & Plan:   Encounter Diagnosis  Name Primary?  . Right elbow pain Yes   Procedure note:  The right elbow was prepped after permission from the patient.  1% Xylocaine and 1 cc DepoMedrol 40 instilled into the area of the right lateral epicondyle by sterile technique tolerated well.  I have explained ice massage to the patient and he is to do this three to four times a day at home.  He cannot take NSAIDs.  Return in two weeks. I told him this can be a chronic problem and not expect quick recovery.  He has tennis elbow band at home and he can use this.  Call if any problem.  Precautions discussed.  Electronically Signed Sanjuana Kava, MD 5/17/20182:08 PM

## 2017-03-29 ENCOUNTER — Ambulatory Visit: Payer: PPO | Admitting: Orthopaedic Surgery

## 2017-03-30 ENCOUNTER — Other Ambulatory Visit: Payer: Self-pay | Admitting: Family Medicine

## 2017-03-31 ENCOUNTER — Ambulatory Visit (INDEPENDENT_AMBULATORY_CARE_PROVIDER_SITE_OTHER): Payer: PPO | Admitting: Family Medicine

## 2017-03-31 ENCOUNTER — Encounter: Payer: Self-pay | Admitting: Family Medicine

## 2017-03-31 VITALS — BP 146/90 | HR 80 | Temp 98.3°F | Resp 16 | Ht 72.0 in | Wt 201.0 lb

## 2017-03-31 DIAGNOSIS — M8700 Idiopathic aseptic necrosis of unspecified bone: Secondary | ICD-10-CM

## 2017-03-31 DIAGNOSIS — I10 Essential (primary) hypertension: Secondary | ICD-10-CM | POA: Diagnosis not present

## 2017-03-31 DIAGNOSIS — R0609 Other forms of dyspnea: Secondary | ICD-10-CM

## 2017-03-31 DIAGNOSIS — I2510 Atherosclerotic heart disease of native coronary artery without angina pectoris: Secondary | ICD-10-CM

## 2017-03-31 DIAGNOSIS — N183 Chronic kidney disease, stage 3 unspecified: Secondary | ICD-10-CM

## 2017-03-31 DIAGNOSIS — F411 Generalized anxiety disorder: Secondary | ICD-10-CM

## 2017-03-31 DIAGNOSIS — E7800 Pure hypercholesterolemia, unspecified: Secondary | ICD-10-CM

## 2017-03-31 LAB — COMPLETE METABOLIC PANEL WITH GFR
ALBUMIN: 4.1 g/dL (ref 3.6–5.1)
ALK PHOS: 63 U/L (ref 40–115)
ALT: 23 U/L (ref 9–46)
AST: 20 U/L (ref 10–35)
BILIRUBIN TOTAL: 0.8 mg/dL (ref 0.2–1.2)
BUN: 10 mg/dL (ref 7–25)
CO2: 24 mmol/L (ref 20–31)
Calcium: 9.2 mg/dL (ref 8.6–10.3)
Chloride: 104 mmol/L (ref 98–110)
Creat: 1.46 mg/dL — ABNORMAL HIGH (ref 0.70–1.33)
GFR, Est African American: 61 mL/min (ref >=60)
GFR, Est Non African American: 53 mL/min — ABNORMAL LOW (ref >=60)
GLUCOSE: 90 mg/dL (ref 70–99)
POTASSIUM: 4.5 mmol/L (ref 3.5–5.3)
SODIUM: 137 mmol/L (ref 135–146)
TOTAL PROTEIN: 6.7 g/dL (ref 6.1–8.1)

## 2017-03-31 LAB — CBC WITH DIFFERENTIAL/PLATELET
BASOS ABS: 64 {cells}/uL (ref 0–200)
Basophils Relative: 1 %
EOS ABS: 128 {cells}/uL (ref 15–500)
Eosinophils Relative: 2 %
HCT: 45.2 % (ref 38.5–50.0)
Hemoglobin: 15 g/dL (ref 13.0–17.0)
LYMPHS PCT: 25 %
Lymphs Abs: 1600 cells/uL (ref 850–3900)
MCH: 30.7 pg (ref 27.0–33.0)
MCHC: 33.2 g/dL (ref 32.0–36.0)
MCV: 92.4 fL (ref 80.0–100.0)
MONOS PCT: 7 %
MPV: 9.8 fL (ref 7.5–12.5)
Monocytes Absolute: 448 cells/uL (ref 200–950)
Neutro Abs: 4160 cells/uL (ref 1500–7800)
Neutrophils Relative %: 65 %
PLATELETS: 212 10*3/uL (ref 140–400)
RBC: 4.89 MIL/uL (ref 4.20–5.80)
RDW: 13.8 % (ref 11.0–15.0)
WBC: 6.4 10*3/uL (ref 3.8–10.8)

## 2017-03-31 LAB — LIPID PANEL
Cholesterol: 202 mg/dL — ABNORMAL HIGH (ref <200)
HDL: 46 mg/dL (ref >40)
LDL Cholesterol: 135 mg/dL — ABNORMAL HIGH (ref <100)
Total CHOL/HDL Ratio: 4.4 Ratio (ref <5.0)
Triglycerides: 103 mg/dL (ref <150)
VLDL: 21 mg/dL (ref <30)

## 2017-03-31 MED ORDER — HYDROCODONE-ACETAMINOPHEN 5-325 MG PO TABS: 1.0000 | ORAL_TABLET | Freq: Three times a day (TID) | ORAL | 0 refills | Status: DC | PRN

## 2017-03-31 MED ORDER — ALPRAZOLAM 0.5 MG PO TABS: 0.5000 mg | ORAL_TABLET | Freq: Three times a day (TID) | ORAL | 0 refills | Status: DC | PRN

## 2017-03-31 NOTE — Progress Notes (Signed)
Subjective:    Patient ID: Brandon Buck, male    DOB: 09/26/1961, 56 y.o.   MRN: [id number]  HPI  04/26/16 I saw the patient in 2015 for dyspnea on exertion. At that time echocardiogram of the heart revealed normal systolic function. Pulmonary function test suggested COPD and we try the patient was previously which gave him minimal improvement. The dyspnea on exertion has steadily worsened. He now is also complaining of atypical chest pain unrelated to exertion. Past medical history is significant for coronary artery disease status post 2 stents. Repeat catheterization after the stent showed noncritical stenosis with the decision to manage medically. He has not seen his cardiologist in several years. His blood pressure today is acceptable. He denies any myalgias or right upper quadrant pain. He denies any angina. However he does report dyspnea with minimal exertion. He is due for hepatitis C screening as well as prostate cancer screening. He is also overdue for colonoscopy. Unfortunately his mother is dying from cancer and he is too busy now and he would like to defer the colonoscopy until after the situation has resolved.  At that time, my plan was: Blood pressure is adequate. I will check renal function to monitor his chronic kidney disease. I will also check the patient for hepatitis C as well as prostate cancer. I will check a fasting lipid panel to monitor his LDL cholesterol. Goal LDL cholesterol is less than 70. I will schedule the patient to see his cardiologist. I believe he would benefit from a stress test to ensure that he does not have underlying ischemia causing his dyspnea on exertion. Patient also complains of right-sided hearing loss. Examination reveals a cerumen impaction in the right ear that was removed easily with irrigation and lavage  07/30/16 For documentation purposes, the patient is on hydrocodone 1 tablet 3 times a day chronically for bilateral hip pain. He has a history of  bilateral AVN in both hips. Most recent imaging of the hip was performed with an MRI in 2008 and his results are documented below: 1.  No AVN. 2.  Post procedural changes in the left femoral head and neck, likely representing either decompressive osteotomy for prior AVN or revascularization with fibular bone graft.   3.  The bony changes in the left femoral head and neck are unchanged dating back to a comparison CT of 06/08/04.  He is unable to take NSAIDs due to his chronic kidney disease and Tylenol has been unable to control his pain. There is no evidence of abuse or diversion. The patient safely fills his prescription once a month. He never tries to fill his prescriptions early, he never loses his prescriptions, and he demonstrates no warning signs for abuse or diversion.  LDL at visit in July was 158. He is here for recheck.  We increased his Lipitor to 80 mg a day.  Furthermore he is tried harder on his diet to avoid saturated fat. He is not able to exercise because of his hip pain. He is interested in rechecking his cholesterol today.  At that time, my plan was: Blood pressures borderline. Recently had a stress test and everything came back normal. Check a fasting lipid panel. Goal LDL is less than 70. If LDL is not at goal, I will add zetia to his Lipitor.  Patient received his flu shot today. Also refill his hydrocodone and his Xanax  03/31/17 Patient is here today for follow-up. He continues to report shortness of breath with exertion.  Patient underwent nuclear stress test last fall with an ejection fraction of 71% and no evidence of ischemia. Cardiologist does not feel that this is cardiac in nature. Previous pulmonary function test suggested COPD. However he has not been taking spree to on a daily basis. He only takes it whenever he feels that he is short of breath. He uses it sparingly and is seen no benefit. He is actually not doing any regular treatment for COPD. He also reports that 80 mg of  Lipitor irritates his stomach. If he does not take the medication he denies any stomach upset. As soon as he takes the medication within a few hours he will report dyspepsia. He has not been taking it consistently because of that reason. He continues to use hydrocodone 3 times a day for arthritis and avascular necrosis in his hip as well as arthritic pain in his shoulders neck and lower back. There is no evidence of abuse or diversion. His blood pressure today is borderline. Past medical history significant for chronic kidney disease for many him from using NSAIDs, and history of coronary artery disease with a goal LDL cholesterol of 70  Past Medical History:  Diagnosis Date  . Cancer ([place])    Kidney  . Gout   . Heart disease   . High blood pressure   . History of PTCA 2    2 stents  . Hypertension   . Kidney disease   . Renal insufficiency    Past Surgical History:  Procedure Laterality Date  . Heart catherization    . HERNIA REPAIR    . HIP SURGERY    . Kidney removed     Current Outpatient Prescriptions on File Prior to Visit  Medication Sig Dispense Refill  . allopurinol (ZYLOPRIM) 100 MG tablet TAKE 2 TABLETS BY MOUTH DAILY. 60 tablet 0  . aspirin 81 MG tablet Take 81 mg by mouth daily.    Marland Kitchen atorvastatin (LIPITOR) 80 MG tablet Take 1 tablet (80 mg total) by mouth daily. 90 tablet 3  . clopidogrel (PLAVIX) 75 MG tablet TAKE 1 TABLET BY MOUTH ONCE A DAY. 90 tablet 1  . diltiazem (CARDIZEM CD) 240 MG 24 hr capsule TAKE (1) CAPSULE BY MOUTH DAILY. 30 capsule 11  . indomethacin (INDOCIN) 25 MG capsule Take 25 mg by mouth daily as needed (gout).     . metoprolol tartrate (LOPRESSOR) 50 MG tablet TAKE ONE HALF TABLET BY MOUTH TWICE DAILY. 30 tablet 1  . nitroGLYCERIN (NITROSTAT) 0.4 MG SL tablet Place 0.4 mg under the tongue every 5 (five) minutes as needed.    . pantoprazole (PROTONIX) 40 MG tablet TAKE ONE TABLET BY MOUTH TWICE DAILY. 60 tablet 11  . tiotropium (SPIRIVA HANDIHALER)  18 MCG inhalation capsule Place 1 capsule (18 mcg total) into inhaler and inhale daily. (Patient taking differently: Place 18 mcg into inhaler and inhale daily as needed (shortness of breath). ) 30 capsule 12   No current facility-administered medications on file prior to visit.    Allergies  Allergen Reactions  . Keflex [Cephalexin] Rash   Social History   Social History  . Marital status: Married    Spouse name: N/A  . Number of children: N/A  . Years of education: N/A   Occupational History  . Not on file.   Social History Main Topics  . Smoking status: Former Smoker    Packs/day: 1.50    Years: 32.00    Types: Cigarettes    Start date:  01/26/1971    Quit date: 01/26/2003  . Smokeless tobacco: Never Used  . Alcohol use 0.0 oz/week     Comment: occ  . Drug use: No  . Sexual activity: Not on file   Other Topics Concern  . Not on file   Social History Narrative  . No narrative on file      Review of Systems  All other systems reviewed and are negative.      Objective:   Physical Exam  Constitutional: He appears well-developed and well-nourished. No distress.  HENT:  Right Ear: External ear normal.  Left Ear: External ear normal.  Nose: Nose normal.  Mouth/Throat: Oropharynx is clear and moist. No oropharyngeal exudate.  Eyes: Conjunctivae are normal. No scleral icterus.  Neck: Neck supple.  Cardiovascular: Normal rate, regular rhythm and normal heart sounds.   Pulmonary/Chest: Effort normal and breath sounds normal. No respiratory distress. He has no wheezes. He has no rales.  Abdominal: Soft. Bowel sounds are normal. He exhibits no distension. There is no tenderness. There is no rebound.  Lymphadenopathy:    He has no cervical adenopathy.  Skin: He is not diaphoretic.  Vitals reviewed.         Assessment & Plan:  ASCVD (arteriosclerotic cardiovascular disease) - Plan: CBC with Differential/Platelet, Lipid panel, COMPLETE METABOLIC PANEL WITH  GFR  Anxiety state - Plan: ALPRAZolam (XANAX) 0.5 MG tablet, DISCONTINUED: ALPRAZolam (XANAX) 0.5 MG tablet, DISCONTINUED: ALPRAZolam (XANAX) 0.5 MG tablet  CKD (chronic kidney disease), stage III  Essential hypertension  Pure hypercholesterolemia  AVN (avascular necrosis of bone) (HCC)  Dyspnea on exertion  Discontinue Spiriva. Patient is not using the medication properly. Gave him samples of anoro 1 inhalation per day and recheck in 2 weeks.  I believe his dyspnea on exertion is likely related to underlying COPD as well as deconditioning. Check CBC, CMP, fasting lipid panel. Monitor renal function. Monitor for anemia of chronic disease secondary to chronic kidney disease. Goal LDL cholesterol is less than 70. Patient is not tolerating max dose Lipitor. I've asked the patient to decrease the dose of Lipitor to 40 mg and to take the tablet at night to see if he can tolerate it better. Reassess in 2 weeks. If he's tolerating this in 2 weeks, I will likely increase the dose to 80 mg at night. Blood pressure today is borderline.  He is on a beta blocker because of his heart disease as well as Cardizem to help control his heart rate. He is not taking any ACE inhibitor which would be beneficial given his chronic kidney disease. I will check a CMP to monitor his potassium as well as his creatinine and if reasonable, will start the patient on low-dose lisinopril.

## 2017-06-16 ENCOUNTER — Other Ambulatory Visit: Payer: Self-pay | Admitting: Family Medicine

## 2017-06-28 ENCOUNTER — Other Ambulatory Visit: Payer: Self-pay | Admitting: *Deleted

## 2017-06-28 DIAGNOSIS — F411 Generalized anxiety disorder: Secondary | ICD-10-CM

## 2017-06-28 NOTE — Telephone Encounter (Signed)
ok 

## 2017-06-28 NOTE — Telephone Encounter (Signed)
Patient returned call.   States that he requires refill on Hydrocodone and Xanax. States that he usually gets x3 printed prescriptions.   Ok to refill??  Last office visit 03/31/2017.   Last refill on Hydrocodone 03/31/2017.   Last refill on Xanax 03/31/2017.

## 2017-06-28 NOTE — Telephone Encounter (Signed)
Received VM from patient.   Requested medication refill, but did not leave name of medication.   Call placed to patient to inquire. [place].

## 2017-06-29 MED ORDER — HYDROCODONE-ACETAMINOPHEN 5-325 MG PO TABS: 1.0000 | ORAL_TABLET | Freq: Three times a day (TID) | ORAL | 0 refills | Status: DC | PRN

## 2017-06-29 MED ORDER — ALPRAZOLAM 0.5 MG PO TABS: 0.5000 mg | ORAL_TABLET | Freq: Three times a day (TID) | ORAL | 0 refills | Status: DC | PRN

## 2017-06-29 NOTE — Telephone Encounter (Addendum)
RX printed x 3 months, left up front and patient aware to pick up tomorrow via vm 

## 2017-07-21 ENCOUNTER — Telehealth: Payer: Self-pay | Admitting: *Deleted

## 2017-07-21 NOTE — Telephone Encounter (Signed)
Patient called requesting something for his elbow, per patient he has been using ice packs but his elbow is still swollen.

## 2017-07-27 NOTE — Telephone Encounter (Signed)
Need to see if worse.

## 2017-07-29 ENCOUNTER — Other Ambulatory Visit: Payer: Self-pay | Admitting: Family Medicine

## 2017-08-08 ENCOUNTER — Other Ambulatory Visit: Payer: Self-pay | Admitting: Family Medicine

## 2017-08-08 DIAGNOSIS — F411 Generalized anxiety disorder: Secondary | ICD-10-CM

## 2017-08-08 NOTE — Telephone Encounter (Signed)
ok 

## 2017-08-08 NOTE — Telephone Encounter (Signed)
Last OV 03/31/2017 Last refill 11/2 Ok to refill?

## 2017-08-09 NOTE — Telephone Encounter (Signed)
rx called into pharmacy

## 2017-09-13 ENCOUNTER — Other Ambulatory Visit: Payer: Self-pay | Admitting: Family Medicine

## 2017-09-28 ENCOUNTER — Telehealth: Payer: Self-pay | Admitting: Family Medicine

## 2017-09-28 DIAGNOSIS — F411 Generalized anxiety disorder: Secondary | ICD-10-CM

## 2017-09-28 NOTE — Telephone Encounter (Signed)
Pt called requesting refill on Hydrocodone and Xanax - Ok to refill x 3 months??

## 2017-09-29 MED ORDER — HYDROCODONE-ACETAMINOPHEN 5-325 MG PO TABS: 1.0000 | ORAL_TABLET | Freq: Three times a day (TID) | ORAL | 0 refills | Status: DC | PRN

## 2017-09-29 MED ORDER — ALPRAZOLAM 0.5 MG PO TABS: 0.5000 mg | ORAL_TABLET | Freq: Three times a day (TID) | ORAL | 0 refills | Status: DC | PRN

## 2017-09-29 NOTE — Telephone Encounter (Signed)
ok 

## 2017-09-29 NOTE — Telephone Encounter (Signed)
RX printed, left up front and patient aware to pick up via m

## 2017-10-31 ENCOUNTER — Other Ambulatory Visit: Payer: Self-pay | Admitting: Family Medicine

## 2017-11-23 ENCOUNTER — Other Ambulatory Visit: Payer: Self-pay

## 2017-11-23 ENCOUNTER — Encounter (HOSPITAL_COMMUNITY): Payer: Self-pay | Admitting: Emergency Medicine

## 2017-11-23 ENCOUNTER — Emergency Department (HOSPITAL_COMMUNITY): Payer: PPO

## 2017-11-23 ENCOUNTER — Emergency Department (HOSPITAL_COMMUNITY)
Admission: EM | Admit: 2017-11-23 | Discharge: 2017-11-23 | Disposition: A | Discharge status: Home / Self Care | Payer: PPO | Attending: Emergency Medicine | Admitting: Emergency Medicine

## 2017-11-23 DIAGNOSIS — Y999 Unspecified external cause status: Secondary | ICD-10-CM | POA: Diagnosis not present

## 2017-11-23 DIAGNOSIS — Z7982 Long term (current) use of aspirin: Secondary | ICD-10-CM | POA: Diagnosis not present

## 2017-11-23 DIAGNOSIS — X509XXA Other and unspecified overexertion or strenuous movements or postures, initial encounter: Secondary | ICD-10-CM | POA: Diagnosis not present

## 2017-11-23 DIAGNOSIS — S39012A Strain of muscle, fascia and tendon of lower back, initial encounter: Secondary | ICD-10-CM | POA: Insufficient documentation

## 2017-11-23 DIAGNOSIS — Z7902 Long term (current) use of antithrombotics/antiplatelets: Secondary | ICD-10-CM | POA: Diagnosis not present

## 2017-11-23 DIAGNOSIS — Z85528 Personal history of other malignant neoplasm of kidney: Secondary | ICD-10-CM | POA: Diagnosis not present

## 2017-11-23 DIAGNOSIS — M545 Low back pain: Secondary | ICD-10-CM | POA: Diagnosis not present

## 2017-11-23 DIAGNOSIS — Z79899 Other long term (current) drug therapy: Secondary | ICD-10-CM | POA: Insufficient documentation

## 2017-11-23 DIAGNOSIS — Y92018 Other place in single-family (private) house as the place of occurrence of the external cause: Secondary | ICD-10-CM | POA: Diagnosis not present

## 2017-11-23 DIAGNOSIS — Z87891 Personal history of nicotine dependence: Secondary | ICD-10-CM | POA: Insufficient documentation

## 2017-11-23 DIAGNOSIS — N189 Chronic kidney disease, unspecified: Secondary | ICD-10-CM | POA: Diagnosis not present

## 2017-11-23 DIAGNOSIS — I129 Hypertensive chronic kidney disease with stage 1 through stage 4 chronic kidney disease, or unspecified chronic kidney disease: Secondary | ICD-10-CM | POA: Diagnosis not present

## 2017-11-23 DIAGNOSIS — R809 Proteinuria, unspecified: Secondary | ICD-10-CM | POA: Diagnosis not present

## 2017-11-23 DIAGNOSIS — M1612 Unilateral primary osteoarthritis, left hip: Secondary | ICD-10-CM | POA: Diagnosis not present

## 2017-11-23 DIAGNOSIS — Y93E8 Activity, other personal hygiene: Secondary | ICD-10-CM | POA: Insufficient documentation

## 2017-11-23 DIAGNOSIS — S3992XA Unspecified injury of lower back, initial encounter: Secondary | ICD-10-CM | POA: Diagnosis present

## 2017-11-23 HISTORY — DX: Lateral epicondylitis, unspecified elbow: M77.10

## 2017-11-23 LAB — URINALYSIS, ROUTINE W REFLEX MICROSCOPIC
Bacteria, UA: NONE SEEN
Bilirubin Urine: NEGATIVE
GLUCOSE, UA: NEGATIVE mg/dL
Hgb urine dipstick: NEGATIVE
Ketones, ur: NEGATIVE mg/dL
Leukocytes, UA: NEGATIVE
Nitrite: NEGATIVE
PH: 7 (ref 5.0–8.0)
PROTEIN: 100 mg/dL — AB
SQUAMOUS EPITHELIAL / LPF: NONE SEEN
Specific Gravity, Urine: 1.017 (ref 1.005–1.030)

## 2017-11-23 MED ORDER — CYCLOBENZAPRINE HCL 10 MG PO TABS: 5.0000 mg | ORAL_TABLET | Freq: Two times a day (BID) | ORAL | 0 refills | Status: AC | PRN

## 2017-11-23 MED ORDER — ACETAMINOPHEN 325 MG PO TABS
650.0000 mg | ORAL_TABLET | Freq: Once | ORAL | Status: AC
  Administered 2017-11-23: 650 mg via ORAL
  Filled 2017-11-23: qty 2

## 2017-11-23 MED ORDER — ACETAMINOPHEN 325 MG PO TABS: 650.0000 mg | ORAL_TABLET | Freq: Four times a day (QID) | ORAL | 0 refills | Status: DC | PRN

## 2017-11-23 MED ORDER — LIDOCAINE 5 % EX PTCH: 1.0000 | MEDICATED_PATCH | CUTANEOUS | 0 refills | Status: DC

## 2017-11-23 NOTE — ED Provider Notes (Signed)
[name] EMERGENCY DEPARTMENT Provider Note   CSN: [id number] Arrival date & time: 11/23/17  1126     History   Chief Complaint Chief Complaint  Patient presents with  . Back Pain    HPI Brandon Buck is a 57 y.o. male.  HPI   57 y/o male with a H/o kidney CA s/p right nephrectomy (2005) and aseptic necrosis (left hip) presenting with right lower back pain that began about 2 weeks ago after he bent down to tie his shoes. Pain is 7/10 at rest and 10/10 when he moves. Pain is also worse when we sits for an extended period of time. Pain improves when he stands up or walks. States that pain initially was not focal and was located in his bilateral back and would intermittently radiate to the bilat groin. Does not have groin pain currently. Denies numbness/weakness/tingling to BLE. Denies loss of control of bowels or bladder. Denies a h/o IVDU. Denies fevers, chest pain, SOB, flank pain, abd pain, nausea, vomiting, diarrhea, constipation, dysuria, frequency, urgency, hematuria, penile pain, penile discharge.    States he did not take his blood pressure medicine or any of his other medicine today.  States that this pain does not feel similar to when he had aseptic necrosis. Denies a h/o nephrolithiasis. Denies any recent trauma or falls.  Past Medical History:  Diagnosis Date  . Cancer ([place])    Kidney  . Gout   . Heart disease   . High blood pressure   . History of PTCA 2    2 stents  . Hypertension   . Kidney disease   . Renal insufficiency   . Tennis elbow     Patient Active Problem List   Diagnosis Date Noted  . History of PTCA 2   . Gout   . Plantar fasciitis 03/01/2012  . HIP PAIN 11/11/2008  . ASEPTIC NECROSIS 11/11/2008    Past Surgical History:  Procedure Laterality Date  . Heart catherization    . HERNIA REPAIR    . HIP SURGERY    . Kidney removed         Home Medications    Prior to Admission medications   Medication Sig Start Date End Date  Taking? Authorizing Provider  acetaminophen (TYLENOL) 325 MG tablet Take 2 tablets (650 mg total) by mouth every 6 (six) hours as needed. 11/23/17   Maday Guarino S, PA-C  allopurinol (ZYLOPRIM) 100 MG tablet TAKE 2 TABLETS BY MOUTH DAILY. 08/20/16   [name], MD  ALPRAZolam Duanne Moron) 0.5 MG tablet Take 1 tablet (0.5 mg total) by mouth 3 (three) times daily as needed. for anxiety 11/28/17   [name], MD  aspirin 81 MG tablet Take 81 mg by mouth daily.    [provider]  atorvastatin (LIPITOR) 80 MG tablet TAKE (1) TABLET BY MOUTH ONCE DAILY. 11/01/17   [name], MD  clopidogrel (PLAVIX) 75 MG tablet TAKE 1 TABLET BY MOUTH ONCE A DAY. 11/01/17   [name], MD  cyclobenzaprine (FLEXERIL) 10 MG tablet Take 0.5 tablets (5 mg total) by mouth 2 (two) times daily as needed for up to 5 days for muscle spasms. You may take 1/2 tablet up to three times daily as needed. Do not drive, operate machinery, or work while taking this medication. 11/23/17 11/28/17  Jordayn Mink S, PA-C  diltiazem (CARDIZEM CD) 240 MG 24 hr capsule TAKE (1) CAPSULE BY MOUTH DAILY. 11/01/17   [name],  MD  HYDROcodone-acetaminophen (NORCO/VICODIN) 5-325 MG tablet Take 1 tablet by mouth 3 (three) times daily as needed for moderate pain. 11/28/17   [name], MD  indomethacin (INDOCIN) 25 MG capsule Take 25 mg by mouth daily as needed (gout).     [provider]  lidocaine (LIDODERM) 5 % Place 1 patch onto the skin daily. Place one patch on the skin daily. Remove & Discard patch within 12 hours of use. 11/23/17   Tuwanda Vokes S, PA-C  metoprolol tartrate (LOPRESSOR) 50 MG tablet TAKE ONE HALF TABLET BY MOUTH TWICE DAILY. 11/01/17   [name], MD  nitroGLYCERIN (NITROSTAT) 0.4 MG SL tablet Place 0.4 mg under the tongue every 5 (five) minutes as needed.    [provider]  pantoprazole (PROTONIX) 40 MG tablet TAKE ONE TABLET BY MOUTH TWICE DAILY. 09/13/17   [name], MD  tiotropium (SPIRIVA HANDIHALER) 18 MCG inhalation capsule Place 1 capsule (18 mcg total) into inhaler and inhale daily. Patient taking differently: Place 18 mcg into inhaler and inhale daily as needed (shortness of breath).  08/14/14   [name], MD    Family History Family History  Problem Relation Age of Onset  . Heart disease Unknown   . Cancer Unknown   . Kidney disease Unknown     Social History Social History   Tobacco Use  . Smoking status: Former Smoker    Packs/day: 1.50    Years: 32.00    Pack years: 48.00    Types: Cigarettes    Start date: 01/26/1971    Last attempt to quit: 01/26/2003    Years since quitting: 14.8  . Smokeless tobacco: Never Used  Substance Use Topics  . Alcohol use: Yes    Alcohol/week: 0.0 oz    Comment: occ  . Drug use: No     Allergies   Keflex [cephalexin]   Review of Systems Review of Systems  Constitutional: Negative for chills and fever.  HENT: Negative for ear pain and sore throat.   Eyes: Negative for pain and visual disturbance.  Respiratory: Negative for cough and shortness of breath.   Cardiovascular: Negative for chest pain and palpitations.  Gastrointestinal: Negative for abdominal pain, constipation, diarrhea, nausea and vomiting.  Genitourinary: Negative for discharge, dysuria, flank pain, frequency, hematuria, penile pain, scrotal swelling, testicular pain and urgency.  Musculoskeletal: Positive for back pain. Negative for arthralgias and gait problem.  Skin: Negative for color change and rash.  Neurological: Negative for seizures, syncope, weakness and numbness.  All other systems reviewed and are negative.    Physical Exam Updated Vital Signs BP (!) 152/87 (BP Location: Right Arm)   Pulse 78   Temp 98.4 F (36.9 C) (Oral)   Resp 17   Ht 5\' 10"  (1.778 m)   Wt 90.7 kg (200 lb)   SpO2 98%   BMI 28.70 kg/m   Physical Exam  Constitutional: He appears well-developed and well-nourished. No  distress.  HENT:  Head: Normocephalic and atraumatic.  Eyes: Conjunctivae and EOM are normal.  Neck: Normal range of motion. Neck supple.  Cardiovascular: Normal rate, regular rhythm, normal heart sounds and intact distal pulses.  No murmur heard. Pulmonary/Chest: Effort normal and breath sounds normal. No stridor. No respiratory distress. He has no wheezes. He has no rales.  Abdominal: Soft. Bowel sounds are normal. He exhibits no distension. There is no tenderness. There is no guarding.  No CVA TTP.   Musculoskeletal: He exhibits no edema.  No midline TTP to cervical, thoracic, or lumbar spine. TTP to right sciatic notch which reproduces pain. No overlying ecchymosis.  Neurological: He is alert.  Normal tone. 5/5 strength BLE major muscle groups including strong and equal dorsiflexion/plantar flexion Sensory: light touch normal in BLE extremities. Gait: normal gait and balance.  CV: 2+ radial and DP/PT pulses  Skin: Skin is warm and dry.  Psychiatric: He has a normal mood and affect.  Nursing note and vitals reviewed.    ED Treatments / Results  Labs (all labs ordered are listed, but only abnormal results are displayed) Labs Reviewed  URINALYSIS, ROUTINE W REFLEX MICROSCOPIC - Abnormal; Notable for the following components:      Result Value   Protein, ur 100 (*)    All other components within normal limits    EKG  EKG Interpretation None       Radiology Dg Lumbar Spine Complete  Result Date: 11/23/2017 CLINICAL DATA:  Initial evaluation for lower back pain for past 2 weeks, extending into right lower extremity. EXAM: LUMBAR SPINE - COMPLETE 4+ VIEW COMPARISON:  Prior radiograph from 11/25/2014. FINDINGS: Five non rib-bearing lumbar type vertebral bodies. Vertebral bodies normally aligned with preservation of the normal lumbar lordosis. Vertebral body heights maintained. No evidence for fracture or malalignment. Visualized sacrum intact. Mild multilevel degenerative  spondylolysis, most notable at the L3-4 level. Prominent aortic atherosclerosis. IMPRESSION: 1. No radiographic evidence for acute abnormality within the lumbar spine. Multilevel degenerative spondylolysis, progressed relative to 2016. 2. Aortic atherosclerosis. Electronically Signed   By: Jeannine Boga M.D.   On: 11/23/2017 13:14   Dg Pelvis 1-2 Views  Result Date: 11/23/2017 CLINICAL DATA:  Initial evaluation for lower back pain extending into right lower extremity for past 2 weeks. EXAM: PELVIS - 1-2 VIEW COMPARISON:  Prior CT from 10/05/2011. FINDINGS: No acute fracture or dislocation. Femoral heads in normal alignment within the acetabula. Femoral head heights preserved. Bony pelvis intact. SI joints approximated and symmetric. Mild degenerative osteoarthritic changes about the hips bilaterally, slightly worse on the right. No acute soft tissue abnormality. Scattered vascular calcifications noted. IMPRESSION: 1. No acute osseous abnormality about the pelvis. 2. Mild degenerative osteoarthrosis about the hips, right slightly worse than left. Electronically Signed   By: Jeannine Boga M.D.   On: 11/23/2017 13:17    Procedures Procedures (including critical care time)  Medications Ordered in ED Medications  acetaminophen (TYLENOL) tablet 650 mg (650 mg Oral Given 11/23/17 1239)     Initial Impression / Assessment and Plan / ED Course  I have reviewed the triage vital signs and the nursing notes.  Pertinent labs & imaging results that were available during my care of the patient were reviewed by me and considered in my medical decision making (see chart for details).  Clinical Course as of Nov 23 1413  Wed Nov 23, 6554  977 57 year old male prior history of cancer here with low back pain after bending over to tie his shoes.  Patient states he had one prior episode of this remotely.  Not associated with any numbness or tingling.  It increases with movement.  States he tried  getting with his PCP but was unable to.  He denies any otherwise systemic illness symptoms.  He has had a normal neuro exam and does have tenderness on turning and twisting.  We will get some basic imaging that was unremarkable.  Patient will need close follow-up with his PCP for further workup if this does not improve but  is likely musculoskeletal at this point.  [MB]    Clinical Course User Index [MB] Hayden Rasmussen, MD   Rechecked pt. He states his pain is now a 5/10.  Discussed imaging results with pt. Rx sent for Tylenol, flexeril, and lidocaine patches. Discussed plan for discharge with outpatient follow up in 5-7 days for recheck of back pain and possible further workup if not improving. Advised him to f/u with PCP or nephrologist about proteinuria. Advised pt to return to the ER if they experience any new or worsening symptoms. Pt understands and agrees with the plan. All questions answered.   Final Clinical Impressions(s) / ED Diagnoses   Final diagnoses:  Strain of lumbar region, initial encounter  Proteinuria, unspecified type    Patient with back pain.  No neurological deficits and normal neuro exam.  Patient can walk but states is painful.  No loss of bowel or bladder control.  No concern for cauda equina.  No fever, night sweats, weight loss, or IVDU. Xray imaging was ordered given h/o CA. Imaging of lumbar spine and pelvis were negative for acute abnormality, but did show some osteoarthritic changes of the hips, R>L.  RICE protocol, muscle relaxer, and pain medicine indicated and discussed with patient.    ED Discharge Orders        Ordered    acetaminophen (TYLENOL) 325 MG tablet  Every 6 hours PRN     11/23/17 1358    cyclobenzaprine (FLEXERIL) 10 MG tablet  2 times daily PRN     11/23/17 1358    lidocaine (LIDODERM) 5 %  Every 24 hours     11/23/17 1358       Gaynor Genco S, PA-C 11/23/17 1415    Hayden Rasmussen, MD 11/23/17 1933

## 2017-11-23 NOTE — Discharge Instructions (Signed)
You were given a muscle relaxer for your pain. Do not drive, operate heavy machinery or work while taking this medication. Please follow up with your primary doctor within the next 5-7 days to check resolution of your symptoms. Please return to the ER sooner if you have any new or worsening symptoms including fevers, abdominal pain, groin pain, numbness/weakness to your legs, or loss of control of your bowels or bladder.

## 2017-11-23 NOTE — ED Notes (Signed)
Pt taken to xray 

## 2017-11-23 NOTE — ED Triage Notes (Signed)
Pt reports lower back pain since bending down to tie shoes x2 week ago. Pt reports back pain radiating to RLE. Pt denies gi/gu symptoms.

## 2017-11-24 ENCOUNTER — Other Ambulatory Visit: Payer: Self-pay

## 2017-11-28 ENCOUNTER — Other Ambulatory Visit: Payer: Self-pay | Admitting: Family Medicine

## 2017-11-29 ENCOUNTER — Ambulatory Visit: Payer: PPO | Admitting: Family Medicine

## 2017-12-08 ENCOUNTER — Encounter: Payer: Self-pay | Admitting: Family Medicine

## 2017-12-08 ENCOUNTER — Ambulatory Visit (INDEPENDENT_AMBULATORY_CARE_PROVIDER_SITE_OTHER): Payer: PPO | Admitting: Family Medicine

## 2017-12-08 VITALS — BP 162/80 | HR 110 | Temp 98.0°F | Resp 14 | Ht 72.0 in | Wt 205.0 lb

## 2017-12-08 DIAGNOSIS — N183 Chronic kidney disease, stage 3 unspecified: Secondary | ICD-10-CM

## 2017-12-08 DIAGNOSIS — I10 Essential (primary) hypertension: Secondary | ICD-10-CM | POA: Diagnosis not present

## 2017-12-08 MED ORDER — LISINOPRIL 20 MG PO TABS: 20.0000 mg | ORAL_TABLET | Freq: Every day | ORAL | 3 refills | Status: DC

## 2017-12-08 MED ORDER — AMOXICILLIN 875 MG PO TABS: 875.0000 mg | ORAL_TABLET | Freq: Two times a day (BID) | ORAL | 0 refills | Status: DC

## 2017-12-08 NOTE — Progress Notes (Signed)
Subjective:    Patient ID: Brandon Buck, male    DOB: [birth year], 57 y.o.   MRN: [id number]  Medication Refill     04/26/16 I saw the patient in 2015 for dyspnea on exertion. At that time echocardiogram of the heart revealed normal systolic function. Pulmonary function test suggested COPD and we try the patient was previously which gave him minimal improvement. The dyspnea on exertion has steadily worsened. He now is also complaining of atypical chest pain unrelated to exertion. Past medical history is significant for coronary artery disease status post 2 stents. Repeat catheterization after the stent showed noncritical stenosis with the decision to manage medically. He has not seen his cardiologist in several years. His blood pressure today is acceptable. He denies any myalgias or right upper quadrant pain. He denies any angina. However he does report dyspnea with minimal exertion. He is due for hepatitis C screening as well as prostate cancer screening. He is also overdue for colonoscopy. Unfortunately his mother is dying from cancer and he is too busy now and he would like to defer the colonoscopy until after the situation has resolved.  At that time, my plan was: Blood pressure is adequate. I will check renal function to monitor his chronic kidney disease. I will also check the patient for hepatitis C as well as prostate cancer. I will check a fasting lipid panel to monitor his LDL cholesterol. Goal LDL cholesterol is less than 70. I will schedule the patient to see his cardiologist. I believe he would benefit from a stress test to ensure that he does not have underlying ischemia causing his dyspnea on exertion. Patient also complains of right-sided hearing loss. Examination reveals a cerumen impaction in the right ear that was removed easily with irrigation and lavage  07/30/16 For documentation purposes, the patient is on hydrocodone 1 tablet 3 times a day chronically for bilateral hip pain. 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 Most recent imaging of the hip was performed with an MRI in 2008 and his results are documented below: 1.  No AVN. 2.  Post procedural changes in the left femoral head and neck, likely representing either decompressive osteotomy for prior AVN or revascularization with fibular bone graft.   3.  The bony changes in the left femoral head and neck are unchanged dating back to a comparison CT of 06/08/04.  He is unable to take NSAIDs due to his chronic kidney disease and Tylenol has been unable to control his pain. There is no evidence of abuse or diversion. The patient safely fills his prescription once a month. He never tries to fill his prescriptions early, he never loses his prescriptions, and he demonstrates no warning signs for abuse or diversion.  LDL at visit in July was 158. He is here for recheck.  We increased his Lipitor to 80 mg a day.  Furthermore he is tried harder on his diet to avoid saturated fat. He is not able to exercise because of his hip pain. He is interested in rechecking his cholesterol today.  At that time, my plan was: Blood pressures borderline. Recently had a stress test and everything came back normal. Check a fasting lipid panel. Goal LDL is less than 70. If LDL is not at goal, I will add zetia to his Lipitor.  Patient received his flu shot today. Also refill his hydrocodone and his Xanax  03/31/17 Patient is here today for follow-up. He continues to report shortness  of breath with exertion. Patient underwent nuclear stress test last fall with an ejection fraction of 71% and no evidence of ischemia. Cardiologist does not feel that this is cardiac in nature. Previous pulmonary function test suggested COPD. However he has not been taking spree to on a daily basis. He only takes it whenever he feels that he is short of breath. He uses it sparingly and is seen no benefit. He is actually not doing any regular treatment for COPD. He also  reports that 80 mg of Lipitor irritates his stomach. If he does not take the medication he denies any stomach upset. As soon as he takes the medication within a few hours he will report dyspepsia. He has not been taking it consistently because of that reason. He continues to use hydrocodone 3 times a day for arthritis and avascular necrosis in his hip as well as arthritic pain in his shoulders neck and lower back. There is no evidence of abuse or diversion. His blood pressure today is borderline. Past medical history significant for chronic kidney disease for many him from using NSAIDs, and history of coronary artery disease with a goal LDL cholesterol of 70.  At that time, my plan was: Discontinue Spiriva. Patient is not using the medication properly. Gave him samples of anoro 1 inhalation per day and recheck in 2 weeks.  I believe his dyspnea on exertion is likely related to underlying COPD as well as deconditioning. Check CBC, CMP, fasting lipid panel. Monitor renal function. Monitor for anemia of chronic disease secondary to chronic kidney disease. Goal LDL cholesterol is less than 70. Patient is not tolerating max dose Lipitor. I've asked the patient to decrease the dose of Lipitor to 40 mg and to take the tablet at night to see if he can tolerate it better. Reassess in 2 weeks. If he's tolerating this in 2 weeks, I will likely increase the dose to 80 mg at night. Blood pressure today is borderline.  He is on a beta blocker because of his heart disease as well as Cardizem to help control his heart rate. He is not taking any ACE inhibitor which would be beneficial given his chronic kidney disease. I will check a CMP to monitor his potassium as well as his creatinine and if reasonable, will start the patient on low-dose lisinopril.    12/08/17 Was recently seen in the emergency room for low back pain.  Urinalysis was obtained that showed significant proteinuria.  Patient is here today for follow-up.  He has a  known history of chronic kidney disease.  He never started an ACE inhibitor after his last visit.  His creatinine baseline typically ranges between 1.45 and 1.6.  His blood pressure today is elevated 162/80.  He also has an abscessed tooth.  In his left upper gum, the gumline is inflamed and erythematous and swollen next to a tooth with visible decay.  He is already seen a dentist who is recommended extraction.  However has become infected since he last saw the dentist  Past Medical History:  Diagnosis Date  . Cancer ([place])    Kidney  . Gout   . Heart disease   . High blood pressure   . History of PTCA 2    2 stents  . Hypertension   . Kidney disease   . Renal insufficiency   . Tennis elbow    Past Surgical History:  Procedure Laterality Date  . Heart catherization    . HERNIA REPAIR    .  HIP SURGERY    . Kidney removed     Current Outpatient Medications on File Prior to Visit  Medication Sig Dispense Refill  . acetaminophen (TYLENOL) 325 MG tablet Take 2 tablets (650 mg total) by mouth every 6 (six) hours as needed. 24 tablet 0  . allopurinol (ZYLOPRIM) 100 MG tablet TAKE 2 TABLETS BY MOUTH DAILY. 60 tablet 0  . ALPRAZolam (XANAX) 0.5 MG tablet Take 1 tablet (0.5 mg total) by mouth 3 (three) times daily as needed. for anxiety 30 tablet 0  . aspirin 81 MG tablet Take 81 mg by mouth daily.    Marland Kitchen atorvastatin (LIPITOR) 80 MG tablet TAKE (1) TABLET BY MOUTH ONCE DAILY. 90 tablet 1  . clopidogrel (PLAVIX) 75 MG tablet TAKE 1 TABLET BY MOUTH ONCE A DAY. 90 tablet 1  . diltiazem (CARDIZEM CD) 240 MG 24 hr capsule TAKE (1) CAPSULE BY MOUTH DAILY. 90 capsule 1  . HYDROcodone-acetaminophen (NORCO/VICODIN) 5-325 MG tablet Take 1 tablet by mouth 3 (three) times daily as needed for moderate pain. 90 tablet 0  . indomethacin (INDOCIN) 25 MG capsule Take 25 mg by mouth daily as needed (gout).     Marland Kitchen lidocaine (LIDODERM) 5 % Place 1 patch onto the skin daily. Place one patch on the skin daily. Remove  & Discard patch within 12 hours of use. 5 patch 0  . metoprolol tartrate (LOPRESSOR) 50 MG tablet TAKE ONE HALF TABLET BY MOUTH TWICE DAILY. 90 tablet 1  . nitroGLYCERIN (NITROSTAT) 0.4 MG SL tablet Place 0.4 mg under the tongue every 5 (five) minutes as needed.    . pantoprazole (PROTONIX) 40 MG tablet TAKE ONE TABLET BY MOUTH TWICE DAILY. 60 tablet 5  . tiotropium (SPIRIVA HANDIHALER) 18 MCG inhalation capsule Place 1 capsule (18 mcg total) into inhaler and inhale daily. (Patient taking differently: Place 18 mcg into inhaler and inhale daily as needed (shortness of breath). ) 30 capsule 12  . cyclobenzaprine (FLEXERIL) 10 MG tablet Take 10 mg by mouth 3 (three) times daily as needed for muscle spasms.     No current facility-administered medications on file prior to visit.    Allergies  Allergen Reactions  . Keflex [Cephalexin] Rash   Social History   Socioeconomic History  . Marital status: Married    Spouse name: Not on file  . Number of children: Not on file  . Years of education: Not on file  . Highest education level: Not on file  Social Needs  . Financial resource strain: Not on file  . Food insecurity - worry: Not on file  . Food insecurity - inability: Not on file  . Transportation needs - medical: Not on file  . Transportation needs - non-medical: Not on file  Occupational History  . Not on file  Tobacco Use  . Smoking status: Former Smoker    Packs/day: 1.50    Years: 32.00    Pack years: 48.00    Types: Cigarettes    Start date: 01/26/1971    Last attempt to quit: 01/26/2003    Years since quitting: 14.8  . Smokeless tobacco: Never Used  Substance and Sexual Activity  . Alcohol use: Yes    Alcohol/week: 0.0 oz    Comment: occ  . Drug use: No  . Sexual activity: Not on file  Other Topics Concern  . Not on file  Social History Narrative  . Not on file      Review of Systems  All other systems reviewed  and are negative.      Objective:   Physical Exam    Constitutional: He appears well-developed and well-nourished. No distress.  HENT:  Right Ear: External ear normal.  Left Ear: External ear normal.  Nose: Nose normal.  Mouth/Throat: Oropharynx is clear and moist. No oropharyngeal exudate.  Eyes: Conjunctivae are normal. No scleral icterus.  Neck: Neck supple.  Cardiovascular: Normal rate, regular rhythm and normal heart sounds.  Pulmonary/Chest: Effort normal and breath sounds normal. No respiratory distress. He has no wheezes. He has no rales.  Abdominal: Soft. Bowel sounds are normal. He exhibits no distension. There is no tenderness. There is no rebound.  Lymphadenopathy:    He has no cervical adenopathy.  Skin: He is not diaphoretic.  Vitals reviewed.         Assessment & Plan:  Benign essential HTN - Plan: BASIC METABOLIC PANEL WITH GFR  CKD (chronic kidney disease), stage III (HCC)  Add lisinopril 20 mg a day and recheck blood pressure as well as urinalysis in 1 month.  Up titrate lisinopril as he will tolerate and monitor creatinine closely.  Begin amoxicillin 875 mg p.o. twice daily for 10 days for abscess tooth and follow-up with dentist as planned.

## 2017-12-09 LAB — BASIC METABOLIC PANEL WITH GFR
BUN/Creatinine Ratio: 7 (calc) (ref 6–22)
BUN: 14 mg/dL (ref 7–25)
CHLORIDE: 104 mmol/L (ref 98–110)
CO2: 26 mmol/L (ref 20–32)
Calcium: 9.8 mg/dL (ref 8.6–10.3)
Creat: 1.89 mg/dL — ABNORMAL HIGH (ref 0.70–1.33)
GFR, EST AFRICAN AMERICAN: 45 mL/min/{1.73_m2} — AB (ref >=60)
GFR, EST NON AFRICAN AMERICAN: 39 mL/min/{1.73_m2} — AB (ref >=60)
Glucose, Bld: 89 mg/dL (ref 65–99)
POTASSIUM: 4.8 mmol/L (ref 3.5–5.3)
Sodium: 139 mmol/L (ref 135–146)

## 2017-12-09 LAB — EXTRA LAV TOP TUBE

## 2017-12-14 ENCOUNTER — Other Ambulatory Visit: Payer: Self-pay | Admitting: Family Medicine

## 2017-12-14 DIAGNOSIS — N183 Chronic kidney disease, stage 3 unspecified: Secondary | ICD-10-CM

## 2017-12-21 ENCOUNTER — Other Ambulatory Visit: Payer: PPO

## 2017-12-21 DIAGNOSIS — N183 Chronic kidney disease, stage 3 unspecified: Secondary | ICD-10-CM

## 2017-12-21 LAB — BASIC METABOLIC PANEL
BUN/Creatinine Ratio: 10 (calc) (ref 6–22)
BUN: 17 mg/dL (ref 7–25)
CALCIUM: 9.8 mg/dL (ref 8.6–10.3)
CHLORIDE: 105 mmol/L (ref 98–110)
CO2: 25 mmol/L (ref 20–32)
Creat: 1.75 mg/dL — ABNORMAL HIGH (ref 0.70–1.33)
Glucose, Bld: 93 mg/dL (ref 65–99)
POTASSIUM: 5.1 mmol/L (ref 3.5–5.3)
SODIUM: 138 mmol/L (ref 135–146)

## 2017-12-21 LAB — EXTRA LAV TOP TUBE

## 2017-12-23 ENCOUNTER — Other Ambulatory Visit: Payer: Self-pay | Admitting: Family Medicine

## 2017-12-23 MED ORDER — HYDROCODONE-ACETAMINOPHEN 5-325 MG PO TABS: 1.0000 | ORAL_TABLET | Freq: Three times a day (TID) | ORAL | 0 refills | Status: DC | PRN

## 2017-12-23 NOTE — Telephone Encounter (Signed)
Patient is requesting a refill on Hydrocodone   LOV:  12/08/17 LRF:   09/29/17  ( x 3 months)

## 2017-12-26 ENCOUNTER — Other Ambulatory Visit: Payer: Self-pay | Admitting: Family Medicine

## 2017-12-26 DIAGNOSIS — F411 Generalized anxiety disorder: Secondary | ICD-10-CM

## 2017-12-26 MED ORDER — ALPRAZOLAM 0.5 MG PO TABS: 0.5000 mg | ORAL_TABLET | Freq: Three times a day (TID) | ORAL | 2 refills | Status: DC | PRN

## 2017-12-26 NOTE — Telephone Encounter (Signed)
Pt is requesting refill on Xanax   LOV: 12/08/17  LRF:   09/29/17

## 2018-01-23 ENCOUNTER — Encounter: Payer: Self-pay | Admitting: Family Medicine

## 2018-01-23 ENCOUNTER — Ambulatory Visit (INDEPENDENT_AMBULATORY_CARE_PROVIDER_SITE_OTHER): Payer: PPO | Admitting: Family Medicine

## 2018-01-23 VITALS — BP 146/80 | HR 64 | Temp 98.4°F | Resp 16 | Ht 72.0 in | Wt 206.0 lb

## 2018-01-23 DIAGNOSIS — I10 Essential (primary) hypertension: Secondary | ICD-10-CM | POA: Diagnosis not present

## 2018-01-23 DIAGNOSIS — N183 Chronic kidney disease, stage 3 unspecified: Secondary | ICD-10-CM

## 2018-01-23 DIAGNOSIS — Z23 Encounter for immunization: Secondary | ICD-10-CM | POA: Diagnosis not present

## 2018-01-23 LAB — URINALYSIS, ROUTINE W REFLEX MICROSCOPIC
BACTERIA UA: NONE SEEN /HPF
BILIRUBIN URINE: NEGATIVE
Glucose, UA: NEGATIVE
Ketones, ur: NEGATIVE
Leukocytes, UA: NEGATIVE
Nitrite: NEGATIVE
SPECIFIC GRAVITY, URINE: 1.025 (ref 1.001–1.03)
Squamous Epithelial / LPF: NONE SEEN /HPF (ref <=5)
WBC UA: NONE SEEN /HPF (ref 0–5)
pH: 6 (ref 5.0–8.0)

## 2018-01-23 LAB — MICROSCOPIC MESSAGE

## 2018-01-23 MED ORDER — LISINOPRIL 40 MG PO TABS: 40.0000 mg | ORAL_TABLET | Freq: Every day | ORAL | 3 refills | Status: DC

## 2018-01-23 MED ORDER — HYDROCODONE-ACETAMINOPHEN 5-325 MG PO TABS: 1.0000 | ORAL_TABLET | Freq: Three times a day (TID) | ORAL | 0 refills | Status: DC | PRN

## 2018-01-23 NOTE — Progress Notes (Signed)
Subjective:    Patient ID: Brandon Buck, male    DOB: July 29, 1961, 57 y.o.   MRN: [id number]  Medication Refill     04/26/16 I saw the patient in 2015 for dyspnea on exertion. At that time echocardiogram of the heart revealed normal systolic function. Pulmonary function test suggested COPD and we try the patient was previously which gave him minimal improvement. The dyspnea on exertion has steadily worsened. He now is also complaining of atypical chest pain unrelated to exertion. Past medical history is significant for coronary artery disease status post 2 stents. Repeat catheterization after the stent showed noncritical stenosis with the decision to manage medically. He has not seen his cardiologist in several years. His blood pressure today is acceptable. He denies any myalgias or right upper quadrant pain. He denies any angina. However he does report dyspnea with minimal exertion. He is due for hepatitis C screening as well as prostate cancer screening. He is also overdue for colonoscopy. Unfortunately his mother is dying from cancer and he is too busy now and he would like to defer the colonoscopy until after the situation has resolved.  At that time, my plan was: Blood pressure is adequate. I will check renal function to monitor his chronic kidney disease. I will also check the patient for hepatitis C as well as prostate cancer. I will check a fasting lipid panel to monitor his LDL cholesterol. Goal LDL cholesterol is less than 70. I will schedule the patient to see his cardiologist. I believe he would benefit from a stress test to ensure that he does not have underlying ischemia causing his dyspnea on exertion. Patient also complains of right-sided hearing loss. Examination reveals a cerumen impaction in the right ear that was removed easily with irrigation and lavage  07/30/16 For documentation purposes, the patient is on hydrocodone 1 tablet 3 times a day chronically for bilateral hip pain. 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 Most recent imaging of the hip was performed with an MRI in 2008 and his results are documented below: 1.  No AVN. 2.  Post procedural changes in the left femoral head and neck, likely representing either decompressive osteotomy for prior AVN or revascularization with fibular bone graft.   3.  The bony changes in the left femoral head and neck are unchanged dating back to a comparison CT of 06/08/04.  He is unable to take NSAIDs due to his chronic kidney disease and Tylenol has been unable to control his pain. There is no evidence of abuse or diversion. The patient safely fills his prescription once a month. He never tries to fill his prescriptions early, he never loses his prescriptions, and he demonstrates no warning signs for abuse or diversion.  LDL at visit in July was 158. He is here for recheck.  We increased his Lipitor to 80 mg a day.  Furthermore he is tried harder on his diet to avoid saturated fat. He is not able to exercise because of his hip pain. He is interested in rechecking his cholesterol today.  At that time, my plan was: Blood pressures borderline. Recently had a stress test and everything came back normal. Check a fasting lipid panel. Goal LDL is less than 70. If LDL is not at goal, I will add zetia to his Lipitor.  Patient received his flu shot today. Also refill his hydrocodone and his Xanax  03/31/17 Patient is here today for follow-up. He continues to report shortness  of breath with exertion. Patient underwent nuclear stress test last fall with an ejection fraction of 71% and no evidence of ischemia. Cardiologist does not feel that this is cardiac in nature. Previous pulmonary function test suggested COPD. However he has not been taking spree to on a daily basis. He only takes it whenever he feels that he is short of breath. He uses it sparingly and is seen no benefit. He is actually not doing any regular treatment for COPD. He also  reports that 80 mg of Lipitor irritates his stomach. If he does not take the medication he denies any stomach upset. As soon as he takes the medication within a few hours he will report dyspepsia. He has not been taking it consistently because of that reason. He continues to use hydrocodone 3 times a day for arthritis and avascular necrosis in his hip as well as arthritic pain in his shoulders neck and lower back. There is no evidence of abuse or diversion. His blood pressure today is borderline. Past medical history significant for chronic kidney disease for many him from using NSAIDs, and history of coronary artery disease with a goal LDL cholesterol of 70.  At that time, my plan was: Discontinue Spiriva. Patient is not using the medication properly. Gave him samples of anoro 1 inhalation per day and recheck in 2 weeks.  I believe his dyspnea on exertion is likely related to underlying COPD as well as deconditioning. Check CBC, CMP, fasting lipid panel. Monitor renal function. Monitor for anemia of chronic disease secondary to chronic kidney disease. Goal LDL cholesterol is less than 70. Patient is not tolerating max dose Lipitor. I've asked the patient to decrease the dose of Lipitor to 40 mg and to take the tablet at night to see if he can tolerate it better. Reassess in 2 weeks. If he's tolerating this in 2 weeks, I will likely increase the dose to 80 mg at night. Blood pressure today is borderline.  He is on a beta blocker because of his heart disease as well as Cardizem to help control his heart rate. He is not taking any ACE inhibitor which would be beneficial given his chronic kidney disease. I will check a CMP to monitor his potassium as well as his creatinine and if reasonable, will start the patient on low-dose lisinopril.    12/08/17 Was recently seen in the emergency room for low back pain.  Urinalysis was obtained that showed significant proteinuria.  Patient is here today for follow-up.  He has a  known history of chronic kidney disease.  He never started an ACE inhibitor after his last visit.  His creatinine baseline typically ranges between 1.45 and 1.6.  His blood pressure today is elevated 162/80.  He also has an abscessed tooth.  In his left upper gum, the gumline is inflamed and erythematous and swollen next to a tooth with visible decay.  He is already seen a dentist who is recommended extraction.  However has become infected since he last saw the dentist.  At that time, my plan was:  Add lisinopril 20 mg a day and recheck blood pressure as well as urinalysis in 1 month.  Up titrate lisinopril as he will tolerate and monitor creatinine closely.  Begin amoxicillin 875 mg p.o. twice daily for 10 days for abscess tooth and follow-up with dentist as planned.  01/23/18 Patient states he feels better since starting the lisinopril.  His blood pressure is better controlled although still elevated.  He  denies any cough or angioedema on the medication.  Repeat urinalysis however still shows +2 protein and +1 blood.  Patient states that he has chronic microscopic hematuria.  He also has underlying chronic kidney disease.  His creatinine remained stable after the addition of lisinopril.  Past Medical History:  Diagnosis Date  . Cancer ([place])    Kidney  . Gout   . Heart disease   . High blood pressure   . History of PTCA 2    2 stents  . Hypertension   . Kidney disease   . Renal insufficiency   . Tennis elbow    Past Surgical History:  Procedure Laterality Date  . Heart catherization    . HERNIA REPAIR    . HIP SURGERY    . Kidney removed     Current Outpatient Medications on File Prior to Visit  Medication Sig Dispense Refill  . acetaminophen (TYLENOL) 325 MG tablet Take 2 tablets (650 mg total) by mouth every 6 (six) hours as needed. 24 tablet 0  . allopurinol (ZYLOPRIM) 100 MG tablet TAKE 2 TABLETS BY MOUTH DAILY. 60 tablet 0  . ALPRAZolam (XANAX) 0.5 MG tablet Take 1 tablet (0.5 mg  total) by mouth 3 (three) times daily as needed. for anxiety 30 tablet 2  . amoxicillin (AMOXIL) 875 MG tablet Take 1 tablet (875 mg total) by mouth 2 (two) times daily. 20 tablet 0  . aspirin 81 MG tablet Take 81 mg by mouth daily.    Marland Kitchen atorvastatin (LIPITOR) 80 MG tablet TAKE (1) TABLET BY MOUTH ONCE DAILY. 90 tablet 1  . clopidogrel (PLAVIX) 75 MG tablet TAKE 1 TABLET BY MOUTH ONCE A DAY. 90 tablet 1  . cyclobenzaprine (FLEXERIL) 10 MG tablet Take 10 mg by mouth 3 (three) times daily as needed for muscle spasms.    Marland Kitchen diltiazem (CARDIZEM CD) 240 MG 24 hr capsule TAKE (1) CAPSULE BY MOUTH DAILY. 90 capsule 1  . indomethacin (INDOCIN) 25 MG capsule Take 25 mg by mouth daily as needed (gout).     Marland Kitchen lidocaine (LIDODERM) 5 % Place 1 patch onto the skin daily. Place one patch on the skin daily. Remove & Discard patch within 12 hours of use. 5 patch 0  . lisinopril (PRINIVIL,ZESTRIL) 20 MG tablet Take 1 tablet (20 mg total) by mouth daily. 90 tablet 3  . metoprolol tartrate (LOPRESSOR) 50 MG tablet TAKE ONE HALF TABLET BY MOUTH TWICE DAILY. 90 tablet 1  . nitroGLYCERIN (NITROSTAT) 0.4 MG SL tablet Place 0.4 mg under the tongue every 5 (five) minutes as needed.    . pantoprazole (PROTONIX) 40 MG tablet TAKE ONE TABLET BY MOUTH TWICE DAILY. 60 tablet 5  . tiotropium (SPIRIVA HANDIHALER) 18 MCG inhalation capsule Place 1 capsule (18 mcg total) into inhaler and inhale daily. (Patient taking differently: Place 18 mcg into inhaler and inhale daily as needed (shortness of breath). ) 30 capsule 12   No current facility-administered medications on file prior to visit.    Allergies  Allergen Reactions  . Keflex [Cephalexin] Rash   Social History   Socioeconomic History  . Marital status: Married    Spouse name: Not on file  . Number of children: Not on file  . Years of education: Not on file  . Highest education level: Not on file  Occupational History  . Not on file  Social Needs  . Financial  resource strain: Not on file  . Food insecurity:    Worry: Not on  file    Inability: Not on file  . Transportation needs:    Medical: Not on file    Non-medical: Not on file  Tobacco Use  . Smoking status: Former Smoker    Packs/day: 1.50    Years: 32.00    Pack years: 48.00    Types: Cigarettes    Start date: 01/26/1971    Last attempt to quit: 01/26/2003    Years since quitting: 15.0  . Smokeless tobacco: Never Used  Substance and Sexual Activity  . Alcohol use: Yes    Alcohol/week: 0.0 oz    Comment: occ  . Drug use: No  . Sexual activity: Not on file  Lifestyle  . Physical activity:    Days per week: Not on file    Minutes per session: Not on file  . Stress: Not on file  Relationships  . Social connections:    Talks on phone: Not on file    Gets together: Not on file    Attends religious service: Not on file    Active member of club or organization: Not on file    Attends meetings of clubs or organizations: Not on file    Relationship status: Not on file  . Intimate partner violence:    Fear of current or ex partner: Not on file    Emotionally abused: Not on file    Physically abused: Not on file    Forced sexual activity: Not on file  Other Topics Concern  . Not on file  Social History Narrative  . Not on file      Review of Systems  All other systems reviewed and are negative.      Objective:   Physical Exam  Constitutional: He appears well-developed and well-nourished. No distress.  HENT:  Right Ear: External ear normal.  Left Ear: External ear normal.  Nose: Nose normal.  Mouth/Throat: Oropharynx is clear and moist. No oropharyngeal exudate.  Eyes: Conjunctivae are normal. No scleral icterus.  Neck: Neck supple.  Cardiovascular: Normal rate, regular rhythm and normal heart sounds.  Pulmonary/Chest: Effort normal and breath sounds normal. No respiratory distress. He has no wheezes. He has no rales.  Abdominal: Soft. Bowel sounds are normal. He  exhibits no distension. There is no tenderness. There is no rebound.  Lymphadenopathy:    He has no cervical adenopathy.  Skin: He is not diaphoretic.  Vitals reviewed.         Assessment & Plan:  CKD (chronic kidney disease), stage III ([place]) - Plan: Urinalysis, Routine w reflex microscopic  Need for prophylactic vaccination against Streptococcus pneumoniae (pneumococcus) - Plan: Pneumococcal polysaccharide vaccine 23-valent greater than or equal to 2yo subcutaneous/IM  Benign essential HTN  Given his chronic kidney disease, I want to maximize the dose of ACE inhibitor that he is using.  Increase lisinopril to 40 mg a day.  This should also be beneficial as it should improve his blood pressure.  Ideally I like his blood pressure in the 130-80 range.  Patient also received Pneumovax 23 today.  He will be due for a booster at age 63.  Blood pressure is fairly controlled.  Increasing lisinopril hopefully ensure better control.  I did refill the patient's pain medication that he uses for his osteoarthritis.

## 2018-02-23 ENCOUNTER — Other Ambulatory Visit: Payer: Self-pay | Admitting: Family Medicine

## 2018-02-23 MED ORDER — HYDROCODONE-ACETAMINOPHEN 5-325 MG PO TABS: 1.0000 | ORAL_TABLET | Freq: Three times a day (TID) | ORAL | 0 refills | Status: DC | PRN

## 2018-02-23 NOTE — Telephone Encounter (Signed)
Patient is requesting a refill on Hydrocodone   LOV: 01/23/18  LRF:   01/23/18

## 2018-03-27 ENCOUNTER — Other Ambulatory Visit: Payer: Self-pay | Admitting: Family Medicine

## 2018-03-27 DIAGNOSIS — F411 Generalized anxiety disorder: Secondary | ICD-10-CM

## 2018-03-27 MED ORDER — HYDROCODONE-ACETAMINOPHEN 5-325 MG PO TABS: 1.0000 | ORAL_TABLET | Freq: Three times a day (TID) | ORAL | 0 refills | Status: DC | PRN

## 2018-03-27 MED ORDER — ALPRAZOLAM 0.5 MG PO TABS: 0.5000 mg | ORAL_TABLET | Freq: Three times a day (TID) | ORAL | 2 refills | Status: DC | PRN

## 2018-03-27 NOTE — Telephone Encounter (Signed)
Patient is requesting a refill on Hydrocodone & Xanax  LOV: 01/23/18  LRF:  02/23/18

## 2018-04-24 ENCOUNTER — Ambulatory Visit (INDEPENDENT_AMBULATORY_CARE_PROVIDER_SITE_OTHER): Payer: PPO | Admitting: Family Medicine

## 2018-04-24 ENCOUNTER — Encounter: Payer: Self-pay | Admitting: Family Medicine

## 2018-04-24 VITALS — BP 140/74 | HR 76 | Temp 98.7°F | Resp 16 | Ht 72.0 in | Wt 202.0 lb

## 2018-04-24 DIAGNOSIS — N183 Chronic kidney disease, stage 3 unspecified: Secondary | ICD-10-CM

## 2018-04-24 DIAGNOSIS — R1032 Left lower quadrant pain: Secondary | ICD-10-CM

## 2018-04-24 DIAGNOSIS — I10 Essential (primary) hypertension: Secondary | ICD-10-CM | POA: Diagnosis not present

## 2018-04-24 LAB — URINALYSIS, ROUTINE W REFLEX MICROSCOPIC
BACTERIA UA: NONE SEEN /HPF
Bilirubin Urine: NEGATIVE
GLUCOSE, UA: NEGATIVE
Ketones, ur: NEGATIVE
Leukocytes, UA: NEGATIVE
NITRITE: NEGATIVE
PH: 5.5 (ref 5.0–8.0)
Specific Gravity, Urine: 1.019 (ref 1.001–1.03)
Squamous Epithelial / LPF: NONE SEEN /HPF (ref <=5)
WBC, UA: NONE SEEN /HPF (ref 0–5)

## 2018-04-24 LAB — MICROSCOPIC MESSAGE

## 2018-04-24 MED ORDER — HYDROCODONE-ACETAMINOPHEN 5-325 MG PO TABS: 1.0000 | ORAL_TABLET | Freq: Three times a day (TID) | ORAL | 0 refills | Status: DC | PRN

## 2018-04-24 NOTE — Progress Notes (Signed)
Subjective:    Patient ID: Brandon Buck, male    DOB: 04/10/1961, 57 y.o.   MRN: [id number]  Medication Refill     04/26/16 I saw the patient in 2015 for dyspnea on exertion. At that time echocardiogram of the heart revealed normal systolic function. Pulmonary function test suggested COPD and we try the patient was previously which gave him minimal improvement. The dyspnea on exertion has steadily worsened. He now is also complaining of atypical chest pain unrelated to exertion. Past medical history is significant for coronary artery disease status post 2 stents. Repeat catheterization after the stent showed noncritical stenosis with the decision to manage medically. He has not seen his cardiologist in several years. His blood pressure today is acceptable. He denies any myalgias or right upper quadrant pain. He denies any angina. However he does report dyspnea with minimal exertion. He is due for hepatitis C screening as well as prostate cancer screening. He is also overdue for colonoscopy. Unfortunately his mother is dying from cancer and he is too busy now and he would like to defer the colonoscopy until after the situation has resolved.  At that time, my plan was: Blood pressure is adequate. I will check renal function to monitor his chronic kidney disease. I will also check the patient for hepatitis C as well as prostate cancer. I will check a fasting lipid panel to monitor his LDL cholesterol. Goal LDL cholesterol is less than 70. I will schedule the patient to see his cardiologist. I believe he would benefit from a stress test to ensure that he does not have underlying ischemia causing his dyspnea on exertion. Patient also complains of right-sided hearing loss. Examination reveals a cerumen impaction in the right ear that was removed easily with irrigation and lavage  07/30/16 For documentation purposes, the patient is on hydrocodone 1 tablet 3 times a day chronically for bilateral hip pain. 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 Most recent imaging of the hip was performed with an MRI in 2008 and his results are documented below: 1.  No AVN. 2.  Post procedural changes in the left femoral head and neck, likely representing either decompressive osteotomy for prior AVN or revascularization with fibular bone graft.   3.  The bony changes in the left femoral head and neck are unchanged dating back to a comparison CT of 06/08/04.  He is unable to take NSAIDs due to his chronic kidney disease and Tylenol has been unable to control his pain. There is no evidence of abuse or diversion. The patient safely fills his prescription once a month. He never tries to fill his prescriptions early, he never loses his prescriptions, and he demonstrates no warning signs for abuse or diversion.  LDL at visit in July was 158. He is here for recheck.  We increased his Lipitor to 80 mg a day.  Furthermore he is tried harder on his diet to avoid saturated fat. He is not able to exercise because of his hip pain. He is interested in rechecking his cholesterol today.  At that time, my plan was: Blood pressures borderline. Recently had a stress test and everything came back normal. Check a fasting lipid panel. Goal LDL is less than 70. If LDL is not at goal, I will add zetia to his Lipitor.  Patient received his flu shot today. Also refill his hydrocodone and his Xanax  03/31/17 Patient is here today for follow-up. He continues to report shortness  of breath with exertion. Patient underwent nuclear stress test last fall with an ejection fraction of 71% and no evidence of ischemia. Cardiologist does not feel that this is cardiac in nature. Previous pulmonary function test suggested COPD. However he has not been taking spree to on a daily basis. He only takes it whenever he feels that he is short of breath. He uses it sparingly and is seen no benefit. He is actually not doing any regular treatment for COPD. He also  reports that 80 mg of Lipitor irritates his stomach. If he does not take the medication he denies any stomach upset. As soon as he takes the medication within a few hours he will report dyspepsia. He has not been taking it consistently because of that reason. He continues to use hydrocodone 3 times a day for arthritis and avascular necrosis in his hip as well as arthritic pain in his shoulders neck and lower back. There is no evidence of abuse or diversion. His blood pressure today is borderline. Past medical history significant for chronic kidney disease for many him from using NSAIDs, and history of coronary artery disease with a goal LDL cholesterol of 70.  At that time, my plan was: Discontinue Spiriva. Patient is not using the medication properly. Gave him samples of anoro 1 inhalation per day and recheck in 2 weeks.  I believe his dyspnea on exertion is likely related to underlying COPD as well as deconditioning. Check CBC, CMP, fasting lipid panel. Monitor renal function. Monitor for anemia of chronic disease secondary to chronic kidney disease. Goal LDL cholesterol is less than 70. Patient is not tolerating max dose Lipitor. I've asked the patient to decrease the dose of Lipitor to 40 mg and to take the tablet at night to see if he can tolerate it better. Reassess in 2 weeks. If he's tolerating this in 2 weeks, I will likely increase the dose to 80 mg at night. Blood pressure today is borderline.  He is on a beta blocker because of his heart disease as well as Cardizem to help control his heart rate. He is not taking any ACE inhibitor which would be beneficial given his chronic kidney disease. I will check a CMP to monitor his potassium as well as his creatinine and if reasonable, will start the patient on low-dose lisinopril.    12/08/17 Was recently seen in the emergency room for low back pain.  Urinalysis was obtained that showed significant proteinuria.  Patient is here today for follow-up.  He has a  known history of chronic kidney disease.  He never started an ACE inhibitor after his last visit.  His creatinine baseline typically ranges between 1.45 and 1.6.  His blood pressure today is elevated 162/80.  He also has an abscessed tooth.  In his left upper gum, the gumline is inflamed and erythematous and swollen next to a tooth with visible decay.  He is already seen a dentist who is recommended extraction.  However has become infected since he last saw the dentist.  At that time, my plan was:  Add lisinopril 20 mg a day and recheck blood pressure as well as urinalysis in 1 month.  Up titrate lisinopril as he will tolerate and monitor creatinine closely.  Begin amoxicillin 875 mg p.o. twice daily for 10 days for abscess tooth and follow-up with dentist as planned.  01/23/18 Patient states he feels better since starting the lisinopril.  His blood pressure is better controlled although still elevated.  He  denies any cough or angioedema on the medication.  Repeat urinalysis however still shows +2 protein and +1 blood.  Patient states that he has chronic microscopic hematuria.  He also has underlying chronic kidney disease.  His creatinine remained stable after the addition of lisinopril.  At that time, my plan was: Given his chronic kidney disease, I want to maximize the dose of ACE inhibitor that he is using.  Increase lisinopril to 40 mg a day.  This should also be beneficial as it should improve his blood pressure.  Ideally I like his blood pressure in the 130-80 range.  Patient also received Pneumovax 23 today.  He will be due for a booster at age 79.  Blood pressure is fairly controlled.  Increasing lisinopril hopefully ensure better control.  I did refill the patient's pain medication that he uses for his osteoarthritis.  04/24/18 Last week, the patient lifted a heavy oak piece of furniture to move it at his home and he developed a sudden severe pain in his left inguinal canal.  He has a history of a  inguinal hernia that was repaired initially by Dr. Arnoldo Morale.  Apparently this led to nerve entrapment and the patient had a revision of his surgery to treat the neuropathic pain.  He has had perpetual numbness in his left inguinal canal ever since.  However now he is having pain in his left inguinal canal with heavy lifting and Valsalva.  There is no visible bulge.  There is no palpable bulge.  There is no visible evidence of a direct or indirect inguinal hernia.  There is no testicular pain or testicular mass.  There is no inguinal lymphadenopathy.  He denies any dysuria or hematuria.  He denies any change in his bowel habits.  He is due today to recheck a urinalysis to monitor his proteinuria as well as his chronic kidney disease by checking a BMP to monitor his creatinine and his glomerular filtration rate.  He is also due for refill on his narcotics.  Past Medical History:  Diagnosis Date  . Cancer ([place])    Kidney  . Gout   . Heart disease   . High blood pressure   . History of PTCA 2    2 stents  . Hypertension   . Kidney disease   . Renal insufficiency   . Tennis elbow    Past Surgical History:  Procedure Laterality Date  . Heart catherization    . HERNIA REPAIR    . HIP SURGERY    . Kidney removed     Current Outpatient Medications on File Prior to Visit  Medication Sig Dispense Refill  . acetaminophen (TYLENOL) 325 MG tablet Take 2 tablets (650 mg total) by mouth every 6 (six) hours as needed. 24 tablet 0  . allopurinol (ZYLOPRIM) 100 MG tablet TAKE 2 TABLETS BY MOUTH DAILY. 60 tablet 0  . ALPRAZolam (XANAX) 0.5 MG tablet Take 1 tablet (0.5 mg total) by mouth 3 (three) times daily as needed. for anxiety 30 tablet 2  . aspirin 81 MG tablet Take 81 mg by mouth daily.    Marland Kitchen atorvastatin (LIPITOR) 80 MG tablet TAKE (1) TABLET BY MOUTH ONCE DAILY. 90 tablet 1  . clopidogrel (PLAVIX) 75 MG tablet TAKE 1 TABLET BY MOUTH ONCE A DAY. 90 tablet 1  . cyclobenzaprine (FLEXERIL) 10 MG tablet  Take 10 mg by mouth 3 (three) times daily as needed for muscle spasms.    Marland Kitchen diltiazem (CARDIZEM CD) 240 MG  24 hr capsule TAKE (1) CAPSULE BY MOUTH DAILY. 90 capsule 1  . indomethacin (INDOCIN) 25 MG capsule Take 25 mg by mouth daily as needed (gout).     Marland Kitchen lidocaine (LIDODERM) 5 % Place 1 patch onto the skin daily. Place one patch on the skin daily. Remove & Discard patch within 12 hours of use. 5 patch 0  . lisinopril (PRINIVIL,ZESTRIL) 40 MG tablet Take 1 tablet (40 mg total) by mouth daily. 90 tablet 3  . metoprolol tartrate (LOPRESSOR) 50 MG tablet TAKE ONE HALF TABLET BY MOUTH TWICE DAILY. 90 tablet 1  . nitroGLYCERIN (NITROSTAT) 0.4 MG SL tablet Place 0.4 mg under the tongue every 5 (five) minutes as needed.    . pantoprazole (PROTONIX) 40 MG tablet TAKE ONE TABLET BY MOUTH TWICE DAILY. 60 tablet 5  . tiotropium (SPIRIVA HANDIHALER) 18 MCG inhalation capsule Place 1 capsule (18 mcg total) into inhaler and inhale daily. (Patient taking differently: Place 18 mcg into inhaler and inhale daily as needed (shortness of breath). ) 30 capsule 12   No current facility-administered medications on file prior to visit.    Allergies  Allergen Reactions  . Keflex [Cephalexin] Rash   Social History   Socioeconomic History  . Marital status: Married    Spouse name: Not on file  . Number of children: Not on file  . Years of education: Not on file  . Highest education level: Not on file  Occupational History  . Not on file  Social Needs  . Financial resource strain: Not on file  . Food insecurity:    Worry: Not on file    Inability: Not on file  . Transportation needs:    Medical: Not on file    Non-medical: Not on file  Tobacco Use  . Smoking status: Former Smoker    Packs/day: 1.50    Years: 32.00    Pack years: 48.00    Types: Cigarettes    Start date: 01/26/1971    Last attempt to quit: 01/26/2003    Years since quitting: 15.2  . Smokeless tobacco: Never Used  Substance and Sexual  Activity  . Alcohol use: Yes    Alcohol/week: 0.0 oz    Comment: occ  . Drug use: No  . Sexual activity: Not on file  Lifestyle  . Physical activity:    Days per week: Not on file    Minutes per session: Not on file  . Stress: Not on file  Relationships  . Social connections:    Talks on phone: Not on file    Gets together: Not on file    Attends religious service: Not on file    Active member of club or organization: Not on file    Attends meetings of clubs or organizations: Not on file    Relationship status: Not on file  . Intimate partner violence:    Fear of current or ex partner: Not on file    Emotionally abused: Not on file    Physically abused: Not on file    Forced sexual activity: Not on file  Other Topics Concern  . Not on file  Social History Narrative  . Not on file      Review of Systems  All other systems reviewed and are negative.      Objective:   Physical Exam  Constitutional: He appears well-developed and well-nourished. No distress.  HENT:  Right Ear: External ear normal.  Left Ear: External ear normal.  Nose: Nose  normal.  Mouth/Throat: Oropharynx is clear and moist. No oropharyngeal exudate.  Eyes: Conjunctivae are normal. No scleral icterus.  Neck: Neck supple.  Cardiovascular: Normal rate, regular rhythm and normal heart sounds.  Pulmonary/Chest: Effort normal and breath sounds normal. No respiratory distress. He has no wheezes. He has no rales.  Abdominal: Soft. Bowel sounds are normal. He exhibits no distension. There is no tenderness. There is no rebound. Hernia confirmed negative in the right inguinal area and confirmed negative in the left inguinal area.  Genitourinary: Testes normal and penis normal. Right testis shows no mass and no tenderness. Left testis shows no mass and no tenderness.  Lymphadenopathy:    He has no cervical adenopathy.       Right: No inguinal adenopathy present.       Left: No inguinal adenopathy present.    Skin: He is not diaphoretic.  Vitals reviewed.         Assessment & Plan:  CKD (chronic kidney disease), stage III ([place]) - Plan: COMPLETE METABOLIC PANEL WITH GFR, Urinalysis, Routine w reflex microscopic  Benign essential HTN - Plan: COMPLETE METABOLIC PANEL WITH GFR, Urinalysis, Routine w reflex microscopic  Lt inguinal pain  There is no evidence of an inguinal hernia on exam however his symptoms and his history are concerning for possible inguinal hernia.  I suspect that at least he may have strained a lower abdominal muscle and it worse he may have completely torn it.  I have recommended that we monitor the patient for signs of a direct inguinal hernia over the next few weeks.  If the pain is not improving or if a visible bulge develops, we can refer the patient to general surgery however the present time I would clinically monitor him to see if the pain gradually improves.  At present I believe it is most likely a muscle strain.  His blood pressure is adequately controlled.  I will repeat a urinalysis to monitor his proteinuria and will also check a BMP to monitor his renal function

## 2018-04-25 ENCOUNTER — Encounter: Payer: Self-pay | Admitting: Family Medicine

## 2018-04-25 DIAGNOSIS — N183 Chronic kidney disease, stage 3 unspecified: Secondary | ICD-10-CM | POA: Insufficient documentation

## 2018-04-25 DIAGNOSIS — R809 Proteinuria, unspecified: Secondary | ICD-10-CM | POA: Insufficient documentation

## 2018-04-25 LAB — COMPLETE METABOLIC PANEL WITH GFR
AG RATIO: 1.7 (calc) (ref 1.0–2.5)
ALKALINE PHOSPHATASE (APISO): 75 U/L (ref 40–115)
ALT: 19 U/L (ref 9–46)
AST: 19 U/L (ref 10–35)
Albumin: 4.7 g/dL (ref 3.6–5.1)
BILIRUBIN TOTAL: 0.6 mg/dL (ref 0.2–1.2)
BUN/Creatinine Ratio: 11 (calc) (ref 6–22)
BUN: 19 mg/dL (ref 7–25)
CALCIUM: 9.9 mg/dL (ref 8.6–10.3)
CHLORIDE: 107 mmol/L (ref 98–110)
CO2: 27 mmol/L (ref 20–32)
Creat: 1.71 mg/dL — ABNORMAL HIGH (ref 0.70–1.33)
GFR, Est African American: 50 mL/min/{1.73_m2} — ABNORMAL LOW (ref >=60)
GFR, Est Non African American: 43 mL/min/{1.73_m2} — ABNORMAL LOW (ref >=60)
GLOBULIN: 2.7 g/dL (ref 1.9–3.7)
Glucose, Bld: 90 mg/dL (ref 65–99)
POTASSIUM: 5 mmol/L (ref 3.5–5.3)
SODIUM: 139 mmol/L (ref 135–146)
Total Protein: 7.4 g/dL (ref 6.1–8.1)

## 2018-04-28 ENCOUNTER — Ambulatory Visit: Payer: PPO | Admitting: Family Medicine

## 2018-05-25 ENCOUNTER — Other Ambulatory Visit: Payer: Self-pay | Admitting: Family Medicine

## 2018-05-25 DIAGNOSIS — F411 Generalized anxiety disorder: Secondary | ICD-10-CM

## 2018-05-25 MED ORDER — HYDROCODONE-ACETAMINOPHEN 5-325 MG PO TABS: 1.0000 | ORAL_TABLET | Freq: Three times a day (TID) | ORAL | 0 refills | Status: DC | PRN

## 2018-05-25 MED ORDER — ALPRAZOLAM 0.5 MG PO TABS: 0.5000 mg | ORAL_TABLET | Freq: Three times a day (TID) | ORAL | 0 refills | Status: DC | PRN

## 2018-05-25 NOTE — Telephone Encounter (Signed)
Requesting refill    Hydrocodone and Xanax  LOV: 04/24/18  LRF:   04/24/18

## 2018-06-15 DIAGNOSIS — E559 Vitamin D deficiency, unspecified: Secondary | ICD-10-CM | POA: Diagnosis not present

## 2018-06-15 DIAGNOSIS — D509 Iron deficiency anemia, unspecified: Secondary | ICD-10-CM | POA: Diagnosis not present

## 2018-06-15 DIAGNOSIS — N183 Chronic kidney disease, stage 3 (moderate): Secondary | ICD-10-CM | POA: Diagnosis not present

## 2018-06-15 DIAGNOSIS — Z79899 Other long term (current) drug therapy: Secondary | ICD-10-CM | POA: Diagnosis not present

## 2018-06-15 DIAGNOSIS — I10 Essential (primary) hypertension: Secondary | ICD-10-CM | POA: Diagnosis not present

## 2018-06-15 DIAGNOSIS — R809 Proteinuria, unspecified: Secondary | ICD-10-CM | POA: Diagnosis not present

## 2018-06-20 DIAGNOSIS — R809 Proteinuria, unspecified: Secondary | ICD-10-CM | POA: Diagnosis not present

## 2018-06-20 DIAGNOSIS — I10 Essential (primary) hypertension: Secondary | ICD-10-CM | POA: Diagnosis not present

## 2018-06-20 DIAGNOSIS — Z8553 Personal history of malignant neoplasm of renal pelvis: Secondary | ICD-10-CM | POA: Diagnosis not present

## 2018-06-20 DIAGNOSIS — N183 Chronic kidney disease, stage 3 (moderate): Secondary | ICD-10-CM | POA: Diagnosis not present

## 2018-06-20 DIAGNOSIS — E875 Hyperkalemia: Secondary | ICD-10-CM | POA: Diagnosis not present

## 2018-06-20 DIAGNOSIS — J449 Chronic obstructive pulmonary disease, unspecified: Secondary | ICD-10-CM | POA: Diagnosis not present

## 2018-06-23 ENCOUNTER — Other Ambulatory Visit: Payer: Self-pay | Admitting: Family Medicine

## 2018-06-23 MED ORDER — HYDROCODONE-ACETAMINOPHEN 5-325 MG PO TABS: 1.0000 | ORAL_TABLET | Freq: Three times a day (TID) | ORAL | 0 refills | Status: DC | PRN

## 2018-06-23 NOTE — Telephone Encounter (Signed)
Patient is requesting a refill on Hydrocodone   LOV: 04/24/18  LRF:   05/25/18

## 2018-06-28 DIAGNOSIS — E875 Hyperkalemia: Secondary | ICD-10-CM | POA: Diagnosis not present

## 2018-07-03 DIAGNOSIS — J449 Chronic obstructive pulmonary disease, unspecified: Secondary | ICD-10-CM | POA: Diagnosis not present

## 2018-07-03 DIAGNOSIS — I10 Essential (primary) hypertension: Secondary | ICD-10-CM | POA: Diagnosis not present

## 2018-07-03 DIAGNOSIS — N183 Chronic kidney disease, stage 3 (moderate): Secondary | ICD-10-CM | POA: Diagnosis not present

## 2018-07-03 DIAGNOSIS — E875 Hyperkalemia: Secondary | ICD-10-CM | POA: Diagnosis not present

## 2018-07-10 DIAGNOSIS — E875 Hyperkalemia: Secondary | ICD-10-CM | POA: Diagnosis not present

## 2018-07-25 ENCOUNTER — Other Ambulatory Visit: Payer: Self-pay | Admitting: Family Medicine

## 2018-07-25 DIAGNOSIS — F411 Generalized anxiety disorder: Secondary | ICD-10-CM

## 2018-07-25 MED ORDER — HYDROCODONE-ACETAMINOPHEN 5-325 MG PO TABS: 1.0000 | ORAL_TABLET | Freq: Three times a day (TID) | ORAL | 0 refills | Status: DC | PRN

## 2018-07-25 MED ORDER — ALPRAZOLAM 0.5 MG PO TABS: 0.5000 mg | ORAL_TABLET | Freq: Three times a day (TID) | ORAL | 0 refills | Status: DC | PRN

## 2018-07-25 NOTE — Telephone Encounter (Signed)
Patient is requesting a refill on Hydrocodone & Xanax  LOV:  04/24/18  LRF:   06/23/18 Hydrocodone  05/25/18 Xanax

## 2018-08-18 ENCOUNTER — Other Ambulatory Visit: Payer: Self-pay | Admitting: Family Medicine

## 2018-08-21 DIAGNOSIS — L281 Prurigo nodularis: Secondary | ICD-10-CM | POA: Diagnosis not present

## 2018-08-28 ENCOUNTER — Ambulatory Visit: Payer: PPO | Admitting: Orthopedic Surgery

## 2018-08-28 ENCOUNTER — Telehealth: Payer: Self-pay | Admitting: Family Medicine

## 2018-08-28 ENCOUNTER — Ambulatory Visit (INDEPENDENT_AMBULATORY_CARE_PROVIDER_SITE_OTHER): Payer: PPO

## 2018-08-28 ENCOUNTER — Encounter: Payer: Self-pay | Admitting: Orthopedic Surgery

## 2018-08-28 VITALS — BP 155/90 | HR 64 | Ht 73.0 in | Wt 206.0 lb

## 2018-08-28 DIAGNOSIS — F411 Generalized anxiety disorder: Secondary | ICD-10-CM

## 2018-08-28 DIAGNOSIS — M25552 Pain in left hip: Secondary | ICD-10-CM

## 2018-08-28 DIAGNOSIS — M5136 Other intervertebral disc degeneration, lumbar region: Secondary | ICD-10-CM

## 2018-08-28 MED ORDER — HYDROCODONE-ACETAMINOPHEN 5-325 MG PO TABS: 1.0000 | ORAL_TABLET | Freq: Three times a day (TID) | ORAL | 0 refills | Status: DC | PRN

## 2018-08-28 MED ORDER — ALPRAZOLAM 0.5 MG PO TABS: 0.5000 mg | ORAL_TABLET | Freq: Three times a day (TID) | ORAL | 0 refills | Status: DC | PRN

## 2018-08-28 MED ORDER — PANTOPRAZOLE SODIUM 40 MG PO TBEC: 40.0000 mg | DELAYED_RELEASE_TABLET | Freq: Two times a day (BID) | ORAL | 5 refills | Status: DC

## 2018-08-28 NOTE — Telephone Encounter (Signed)
Patient is calling regarding his protonix  And getting refills on his prescriptions  [phone]

## 2018-08-28 NOTE — Telephone Encounter (Signed)
Requesting refill      LOV: 04/24/18  LRF:  07/25/18 Xanax & Hydrocodone

## 2018-08-28 NOTE — Progress Notes (Signed)
NEW PATIENT OFFICE VISIT  Chief Complaint  Patient presents with  . Hip Pain    Left hip for 1 month    57 year old male status post nephrectomy history of avascular necrosis status post core decompression 15 years ago which was done with bone grafting presents with lower back and left hip pain x1 month with some occasional groin pain.  He says he has intermittent back pain but when present he can lay on his left side to become severe he is on Indocin Vicodin occasionally presents for evaluation of the new onset lower back pain.  He says he has improved over the last month and is not really symptomatic in the groin at this time although he still has some lower back pain   Review of Systems  Constitutional: Negative for chills, fever, malaise/fatigue and weight loss.  Gastrointestinal: Negative for constipation.       Denies loss bowel control   Genitourinary:       Denies urinary retention or los of bladder control      Past Medical History:  Diagnosis Date  . Cancer ([place])    Kidney  . CKD (chronic kidney disease) stage 3, GFR 30-59 ml/min ([facility])   . Gout   . Heart disease   . High blood pressure   . History of PTCA 2    2 stents  . Hypertension   . Kidney disease   . Proteinuria   . Renal insufficiency   . Tennis elbow     Past Surgical History:  Procedure Laterality Date  . Heart catherization    . HERNIA REPAIR    . HIP SURGERY    . Kidney removed      Family History  Problem Relation Age of Onset  . Heart disease Unknown   . Cancer Unknown   . Kidney disease Unknown    Social History   Tobacco Use  . Smoking status: Former Smoker    Packs/day: 1.50    Years: 32.00    Pack years: 48.00    Types: Cigarettes    Start date: 01/26/1971    Last attempt to quit: 01/26/2003    Years since quitting: 15.5  . Smokeless tobacco: Never Used  Substance Use Topics  . Alcohol use: Yes    Alcohol/week: 0.0 standard drinks    Comment: occ  . Drug use: No     Allergies  Allergen Reactions  . Keflex [Cephalexin] Rash    Current Meds  Medication Sig  . allopurinol (ZYLOPRIM) 100 MG tablet TAKE 2 TABLETS BY MOUTH DAILY.  Marland Kitchen ALPRAZolam (XANAX) 0.5 MG tablet Take 1 tablet (0.5 mg total) by mouth 3 (three) times daily as needed. for anxiety  . aspirin 81 MG tablet Take 81 mg by mouth daily.  Marland Kitchen atorvastatin (LIPITOR) 80 MG tablet TAKE (1) TABLET BY MOUTH ONCE DAILY.  Marland Kitchen clopidogrel (PLAVIX) 75 MG tablet TAKE 1 TABLET BY MOUTH ONCE A DAY.  Marland Kitchen diltiazem (CARDIZEM CD) 240 MG 24 hr capsule TAKE (1) CAPSULE BY MOUTH DAILY.  Marland Kitchen HYDROcodone-acetaminophen (NORCO/VICODIN) 5-325 MG tablet Take 1 tablet by mouth 3 (three) times daily as needed for moderate pain.  . indomethacin (INDOCIN) 25 MG capsule Take 25 mg by mouth daily as needed (gout).   Marland Kitchen lisinopril (PRINIVIL,ZESTRIL) 40 MG tablet Take 1 tablet (40 mg total) by mouth daily.  . metoprolol tartrate (LOPRESSOR) 50 MG tablet TAKE ONE HALF TABLET BY MOUTH TWICE DAILY.  . nitroGLYCERIN (NITROSTAT) 0.4 MG SL tablet Place  0.4 mg under the tongue every 5 (five) minutes as needed.  . pantoprazole (PROTONIX) 40 MG tablet TAKE ONE TABLET BY MOUTH TWICE DAILY.    BP (!) 155/90   Pulse 64   Ht 6\' 1"  (1.854 m)   Wt 206 lb (93.4 kg)   BMI 27.18 kg/m   Physical Exam  Constitutional: He is oriented to person, place, and time. He appears well-developed and well-nourished.  Vital signs have been reviewed and are stable. Gen. appearance the patient is well-developed and well-nourished with normal grooming and hygiene.   Neurological: He is alert and oriented to person, place, and time. Gait normal.  Skin: Skin is warm and dry. No erythema.  Psychiatric: He has a normal mood and affect.  Vitals reviewed.   Right Hip Exam  Right hip exam is normal.   Tenderness  The patient is experiencing no tenderness.   Range of Motion  The patient has normal right hip ROM.  Muscle Strength  The patient has normal  right hip strength.  Tests  FABER: negative  Other  Erythema: absent Sensation: normal Pulse: present  Comments:  HIP STABILITY NORMAL    Left Hip Exam  Left hip exam is normal.  Tenderness  The patient is experiencing no tenderness.   Range of Motion  The patient has normal left hip ROM.  Muscle Strength  The patient has normal left hip strength.   Tests  FABER: negative  Other  Erythema: absent Sensation: normal Pulse: present  Comments:  HIP STABILITY NORMAL      Patient is tender in his lower back midline and to the left side with no radicular symptoms negative straight leg raise test   MEDICAL DECISION SECTION  Xrays were done at [facility] orthopedics Hip and pelvis no evidence of avascular necrosis no evidence of arthritis previous core decompression tract is notable on x-ray without any degenerative changes  Lumbar spine films were done at [facility] in January 2019  My independent reading of xrays:  Degenerative arthritis is seen primarily at L3 and 4  CLINICAL DATA:  Initial evaluation for lower back pain for past 2 weeks, extending into right lower extremity.   EXAM: LUMBAR SPINE - COMPLETE 4+ VIEW   COMPARISON:  Prior radiograph from 11/25/2014.   FINDINGS: Five non rib-bearing lumbar type vertebral bodies. Vertebral bodies normally aligned with preservation of the normal lumbar lordosis. Vertebral body heights maintained. No evidence for fracture or malalignment. Visualized sacrum intact.   Mild multilevel degenerative spondylolysis, most notable at the L3-4 level.   Prominent aortic atherosclerosis.   IMPRESSION: 1. No radiographic evidence for acute abnormality within the lumbar spine. Multilevel degenerative spondylolysis, progressed relative to 2016. 2. Aortic atherosclerosis.     Electronically Signed   By: Jeannine Boga M.D.   On: 11/23/2017 13:14   Encounter Diagnoses  Name Primary?  . Pain of left  hip joint Yes  . DDD (degenerative disc disease), lumbar     PLAN: (Rx., injectx, surgery, frx, mri/ct) Start physical therapy for 4 weeks  FU AS NEEDED   No orders of the defined types were placed in this encounter.   Arther Abbott, MD  08/28/2018 9:19 AM

## 2018-08-28 NOTE — Patient Instructions (Addendum)
Physical therapy will be arranged at [facility] outpatient for 4 weeks   Back Pain, Adult Many adults have back pain from time to time. Common causes of back pain include:  A strained muscle or ligament.  Wear and tear (degeneration) of the spinal disks.  Arthritis.  A hit to the back.  Back pain can be short-lived (acute) or last a long time (chronic). A physical exam, lab tests, and imaging studies may be done to find the cause of your pain. Follow these instructions at home: Managing pain and stiffness  Take over-the-counter and prescription medicines only as told by your health care provider.  If directed, apply heat to the affected area as often as told by your health care provider. Use the heat source that your health care provider recommends, such as a moist heat pack or a heating pad. ? Place a towel between your skin and the heat source. ? Leave the heat on for 20-30 minutes. ? Remove the heat if your skin turns bright red. This is especially important if you are unable to feel pain, heat, or cold. You have a greater risk of getting burned.  If directed, apply ice to the injured area: ? Put ice in a plastic bag. ? Place a towel between your skin and the bag. ? Leave the ice on for 20 minutes, 2-3 times a day for the first 2-3 days. Activity  Do not stay in bed. Resting more than 1-2 days can delay your recovery.  Take short walks on even surfaces as soon as you are able. Try to increase the length of time you walk each day.  Do not sit, drive, or stand in one place for more than 30 minutes at a time. Sitting or standing for long periods of time can put stress on your back.  Use proper lifting techniques. When you bend and lift, use positions that put less stress on your back: ? Aragon your knees. ? Keep the load close to your body. ? Avoid twisting.  Exercise regularly as told by your health care provider. Exercising will help your back heal faster. This also helps  prevent back injuries by keeping muscles strong and flexible.  Your health care provider may recommend that you see a physical therapist. This person can help you come up with a safe exercise program. Do any exercises as told by your physical therapist. Lifestyle  Maintain a healthy weight. Extra weight puts stress on your back and makes it difficult to have good posture.  Avoid activities or situations that make you feel anxious or stressed. Learn ways to manage anxiety and stress. One way to manage stress is through exercise. Stress and anxiety increase muscle tension and can make back pain worse. General instructions  Sleep on a firm mattress in a comfortable position. Try lying on your side with your knees slightly bent. If you lie on your back, put a pillow under your knees.  Follow your treatment plan as told by your health care provider. This may include: ? Cognitive or behavioral therapy. ? Acupuncture or massage therapy. ? Meditation or yoga. Contact a health care provider if:  You have pain that is not relieved with rest or medicine.  You have increasing pain going down into your legs or buttocks.  Your pain does not improve in 2 weeks.  You have pain at night.  You lose weight.  You have a fever or chills. Get help right away if:  You develop new bowel  or bladder control problems.  You have unusual weakness or numbness in your arms or legs.  You develop nausea or vomiting.  You develop abdominal pain.  You feel faint. Summary  Many adults have back pain from time to time. A physical exam, lab tests, and imaging studies may be done to find the cause of your pain.  Use proper lifting techniques. When you bend and lift, use positions that put less stress on your back.  Take over-the-counter and prescription medicines and apply heat or ice as directed by your health care provider. This information is not intended to replace advice given to you by your health care  provider. Make sure you discuss any questions you have with your health care provider. Document Released: 10/11/2005 Document Revised: 11/15/2016 Document Reviewed: 11/15/2016 Elsevier Interactive Patient Education  [employer].

## 2018-09-26 ENCOUNTER — Other Ambulatory Visit: Payer: Self-pay | Admitting: Family Medicine

## 2018-09-26 DIAGNOSIS — F411 Generalized anxiety disorder: Secondary | ICD-10-CM

## 2018-09-26 MED ORDER — ALPRAZOLAM 0.5 MG PO TABS: 0.5000 mg | ORAL_TABLET | Freq: Three times a day (TID) | ORAL | 0 refills | Status: DC | PRN

## 2018-09-26 MED ORDER — HYDROCODONE-ACETAMINOPHEN 5-325 MG PO TABS: 1.0000 | ORAL_TABLET | Freq: Three times a day (TID) | ORAL | 0 refills | Status: DC | PRN

## 2018-09-26 NOTE — Telephone Encounter (Signed)
Patient is requesting a refill on Hydrocodone & Xanax  LOV: 04/24/18  LRF:   08/28/18

## 2018-09-27 DIAGNOSIS — Z79899 Other long term (current) drug therapy: Secondary | ICD-10-CM | POA: Diagnosis not present

## 2018-09-27 DIAGNOSIS — D509 Iron deficiency anemia, unspecified: Secondary | ICD-10-CM | POA: Diagnosis not present

## 2018-09-27 DIAGNOSIS — I10 Essential (primary) hypertension: Secondary | ICD-10-CM | POA: Diagnosis not present

## 2018-09-27 DIAGNOSIS — R809 Proteinuria, unspecified: Secondary | ICD-10-CM | POA: Diagnosis not present

## 2018-09-27 DIAGNOSIS — E559 Vitamin D deficiency, unspecified: Secondary | ICD-10-CM | POA: Diagnosis not present

## 2018-09-27 DIAGNOSIS — N183 Chronic kidney disease, stage 3 (moderate): Secondary | ICD-10-CM | POA: Diagnosis not present

## 2018-10-03 DIAGNOSIS — E559 Vitamin D deficiency, unspecified: Secondary | ICD-10-CM | POA: Diagnosis not present

## 2018-10-03 DIAGNOSIS — E875 Hyperkalemia: Secondary | ICD-10-CM | POA: Diagnosis not present

## 2018-10-03 DIAGNOSIS — N183 Chronic kidney disease, stage 3 (moderate): Secondary | ICD-10-CM | POA: Diagnosis not present

## 2018-10-03 DIAGNOSIS — J449 Chronic obstructive pulmonary disease, unspecified: Secondary | ICD-10-CM | POA: Diagnosis not present

## 2018-10-03 DIAGNOSIS — I10 Essential (primary) hypertension: Secondary | ICD-10-CM | POA: Diagnosis not present

## 2018-10-11 DIAGNOSIS — E875 Hyperkalemia: Secondary | ICD-10-CM | POA: Diagnosis not present

## 2018-10-27 ENCOUNTER — Other Ambulatory Visit: Payer: Self-pay | Admitting: Family Medicine

## 2018-10-27 NOTE — Telephone Encounter (Signed)
Ok to refill??  Last office visit 04/24/2018.  Last refill 09/26/2018.

## 2018-11-07 ENCOUNTER — Other Ambulatory Visit: Payer: Self-pay | Admitting: Family Medicine

## 2018-11-28 ENCOUNTER — Other Ambulatory Visit: Payer: Self-pay | Admitting: Family Medicine

## 2018-11-28 DIAGNOSIS — F411 Generalized anxiety disorder: Secondary | ICD-10-CM

## 2018-11-28 NOTE — Telephone Encounter (Signed)
Ok to refill??  Last office visit 04/24/2018.  Last refill 10/27/2018.

## 2018-11-29 NOTE — Telephone Encounter (Signed)
Ok to refill??  Last office visit 04/24/2018.  Last refill 09/26/2018.

## 2018-12-02 ENCOUNTER — Other Ambulatory Visit: Payer: Self-pay

## 2018-12-02 ENCOUNTER — Encounter (HOSPITAL_COMMUNITY): Payer: Self-pay | Admitting: Emergency Medicine

## 2018-12-02 ENCOUNTER — Emergency Department (HOSPITAL_COMMUNITY)
Admission: EM | Admit: 2018-12-02 | Discharge: 2018-12-02 | Disposition: A | Discharge status: Home / Self Care | Payer: PPO | Attending: Emergency Medicine | Admitting: Emergency Medicine

## 2018-12-02 DIAGNOSIS — M19072 Primary osteoarthritis, left ankle and foot: Secondary | ICD-10-CM | POA: Insufficient documentation

## 2018-12-02 DIAGNOSIS — Z87891 Personal history of nicotine dependence: Secondary | ICD-10-CM | POA: Diagnosis not present

## 2018-12-02 DIAGNOSIS — M47896 Other spondylosis, lumbar region: Secondary | ICD-10-CM | POA: Diagnosis not present

## 2018-12-02 DIAGNOSIS — M47816 Spondylosis without myelopathy or radiculopathy, lumbar region: Secondary | ICD-10-CM | POA: Diagnosis not present

## 2018-12-02 DIAGNOSIS — Z7982 Long term (current) use of aspirin: Secondary | ICD-10-CM | POA: Insufficient documentation

## 2018-12-02 DIAGNOSIS — Z79899 Other long term (current) drug therapy: Secondary | ICD-10-CM | POA: Diagnosis not present

## 2018-12-02 DIAGNOSIS — M1990 Unspecified osteoarthritis, unspecified site: Secondary | ICD-10-CM | POA: Diagnosis not present

## 2018-12-02 DIAGNOSIS — Z7902 Long term (current) use of antithrombotics/antiplatelets: Secondary | ICD-10-CM | POA: Insufficient documentation

## 2018-12-02 DIAGNOSIS — N183 Chronic kidney disease, stage 3 (moderate): Secondary | ICD-10-CM | POA: Diagnosis not present

## 2018-12-02 DIAGNOSIS — I129 Hypertensive chronic kidney disease with stage 1 through stage 4 chronic kidney disease, or unspecified chronic kidney disease: Secondary | ICD-10-CM | POA: Diagnosis not present

## 2018-12-02 DIAGNOSIS — M79672 Pain in left foot: Secondary | ICD-10-CM | POA: Diagnosis present

## 2018-12-02 HISTORY — DX: Other chronic pain: G89.29

## 2018-12-02 HISTORY — DX: Pain in left hip: M25.552

## 2018-12-02 HISTORY — DX: Dorsalgia, unspecified: M54.9

## 2018-12-02 LAB — CBC WITH DIFFERENTIAL/PLATELET
Abs Immature Granulocytes: 0.03 10*3/uL (ref 0.00–0.07)
BASOS PCT: 1 %
Basophils Absolute: 0.1 10*3/uL (ref 0.0–0.1)
EOS ABS: 0.1 10*3/uL (ref 0.0–0.5)
Eosinophils Relative: 1 %
HCT: 42.3 % (ref 39.0–52.0)
Hemoglobin: 14 g/dL (ref 13.0–17.0)
IMMATURE GRANULOCYTES: 0 %
Lymphocytes Relative: 15 %
Lymphs Abs: 1.3 10*3/uL (ref 0.7–4.0)
MCH: 30.7 pg (ref 26.0–34.0)
MCHC: 33.1 g/dL (ref 30.0–36.0)
MCV: 92.8 fL (ref 80.0–100.0)
Monocytes Absolute: 0.7 10*3/uL (ref 0.1–1.0)
Monocytes Relative: 8 %
NEUTROS PCT: 75 %
NRBC: 0 % (ref 0.0–0.2)
Neutro Abs: 6.3 10*3/uL (ref 1.7–7.7)
PLATELETS: 196 10*3/uL (ref 150–400)
RBC: 4.56 MIL/uL (ref 4.22–5.81)
RDW: 12.9 % (ref 11.5–15.5)
WBC: 8.4 10*3/uL (ref 4.0–10.5)

## 2018-12-02 LAB — URIC ACID: Uric Acid, Serum: 7.2 mg/dL (ref 3.7–8.6)

## 2018-12-02 MED ORDER — PROMETHAZINE HCL 12.5 MG PO TABS
12.5000 mg | ORAL_TABLET | Freq: Once | ORAL | Status: AC
  Administered 2018-12-02: 12.5 mg via ORAL
  Filled 2018-12-02: qty 1

## 2018-12-02 MED ORDER — HYDROCODONE-ACETAMINOPHEN 5-325 MG PO TABS
2.0000 | ORAL_TABLET | Freq: Once | ORAL | Status: AC
  Administered 2018-12-02: 2 via ORAL
  Filled 2018-12-02: qty 2

## 2018-12-02 MED ORDER — TRAMADOL HCL 50 MG PO TABS: 50.0000 mg | ORAL_TABLET | Freq: Four times a day (QID) | ORAL | 0 refills | Status: DC | PRN

## 2018-12-02 MED ORDER — MELOXICAM 7.5 MG PO TABS: 7.5000 mg | ORAL_TABLET | Freq: Every day | ORAL | 0 refills | Status: DC

## 2018-12-02 MED ORDER — COLCHICINE 0.6 MG PO TABS
0.6000 mg | ORAL_TABLET | Freq: Once | ORAL | Status: AC
  Administered 2018-12-02: 0.6 mg via ORAL
  Filled 2018-12-02: qty 1

## 2018-12-02 NOTE — ED Triage Notes (Signed)
Pt reports left hip and foot pain waking him from sleep at 4am.  Denies injury.

## 2018-12-02 NOTE — ED Provider Notes (Signed)
[facility] EMERGENCY DEPARTMENT Provider Note   CSN: [id number] Arrival date & time: 12/02/18  4709     History   Chief Complaint Chief Complaint  Patient presents with  . Foot Pain    HPI Brandon Buck is a 58 y.o. male.     The history is provided by the patient.  Foot Pain  This is a recurrent problem. Episode onset: Pain started on Monday, February 3 in the hip.  On Thursday, February 6 the pain involved the ankle and has been getting worse. The problem occurs constantly. The problem has been gradually worsening. Pertinent negatives include no chest pain, no abdominal pain and no shortness of breath. Exacerbated by: movement. Nothing relieves the symptoms. Treatments tried: gout pain  medication(old medication) The treatment provided no relief.    Past Medical History:  Diagnosis Date  . Cancer ([place])    Kidney  . Chronic back pain   . Chronic hip pain, left   . CKD (chronic kidney disease) stage 3, GFR 30-59 ml/min (HCC)   . Gout   . Heart disease   . High blood pressure   . History of PTCA 2    2 stents  . Hypertension   . Kidney disease   . Proteinuria   . Renal insufficiency   . Tennis elbow     Patient Active Problem List   Diagnosis Date Noted  . CKD (chronic kidney disease) stage 3, GFR 30-59 ml/min (HCC)   . Proteinuria   . History of PTCA 2   . Gout   . Plantar fasciitis 03/01/2012  . HIP PAIN 11/11/2008  . ASEPTIC NECROSIS 11/11/2008    Past Surgical History:  Procedure Laterality Date  . Heart catherization    . HERNIA REPAIR    . HIP SURGERY    . Kidney removed          Home Medications    Prior to Admission medications   Medication Sig Start Date End Date Taking? Authorizing Provider  acetaminophen (TYLENOL) 325 MG tablet Take 2 tablets (650 mg total) by mouth every 6 (six) hours as needed. Patient not taking: Reported on 08/28/2018 11/23/17   Couture, Cortni S, PA-C  allopurinol (ZYLOPRIM) 100 MG tablet TAKE 2 TABLETS BY MOUTH  DAILY. 08/20/16   [name], MD  ALPRAZolam Duanne Moron) 0.5 MG tablet TAKE 1 TABLET BY MOUTH THREE TIMES DAILY AS NEEDED FOR ANXIETY. 11/30/18   [name], MD  aspirin 81 MG tablet Take 81 mg by mouth daily.    [provider]  atorvastatin (LIPITOR) 80 MG tablet TAKE (1) TABLET BY MOUTH ONCE DAILY. 11/07/18   [name], MD  clopidogrel (PLAVIX) 75 MG tablet TAKE 1 TABLET BY MOUTH ONCE A DAY. 11/01/17   [name], MD  cyclobenzaprine (FLEXERIL) 10 MG tablet Take 10 mg by mouth 3 (three) times daily as needed for muscle spasms.    [provider]  diltiazem (CARDIZEM CD) 240 MG 24 hr capsule TAKE (1) CAPSULE BY MOUTH DAILY. 11/07/18   [name], MD  HYDROcodone-acetaminophen (NORCO/VICODIN) 5-325 MG tablet TAKE (1) TABLET BY MOUTH (3) TIMES DAILY AS NEEDED FOR MODERATE PAIN. 11/28/18   [name], MD  indomethacin (INDOCIN) 25 MG capsule Take 25 mg by mouth daily as needed (gout).     [provider]  lidocaine (LIDODERM) 5 % Place 1 patch onto the skin daily. Place one patch on the skin daily. Remove & Discard patch  within 12 hours of use. Patient not taking: Reported on 08/28/2018 11/23/17   Couture, Cortni S, PA-C  lisinopril (PRINIVIL,ZESTRIL) 40 MG tablet Take 1 tablet (40 mg total) by mouth daily. 01/23/18   [name], MD  metoprolol tartrate (LOPRESSOR) 50 MG tablet TAKE ONE HALF TABLET BY MOUTH TWICE DAILY. 11/01/17   [name], MD  nitroGLYCERIN (NITROSTAT) 0.4 MG SL tablet Place 0.4 mg under the tongue every 5 (five) minutes as needed.    [provider]  pantoprazole (PROTONIX) 40 MG tablet Take 1 tablet (40 mg total) by mouth 2 (two) times daily. 08/28/18   [name], MD  tiotropium (SPIRIVA HANDIHALER) 18 MCG inhalation capsule Place 1 capsule (18 mcg total) into inhaler and inhale daily. Patient not taking: Reported on 08/28/2018 08/14/14   [name], MD    Family History Family History    Problem Relation Age of Onset  . Heart disease Other   . Cancer Other   . Kidney disease Other     Social History Social History   Tobacco Use  . Smoking status: Former Smoker    Packs/day: 1.50    Years: 32.00    Pack years: 48.00    Types: Cigarettes    Start date: 01/26/1971    Last attempt to quit: 01/26/2003    Years since quitting: 15.8  . Smokeless tobacco: Never Used  Substance Use Topics  . Alcohol use: Yes    Alcohol/week: 0.0 standard drinks    Comment: occ  . Drug use: No     Allergies   Keflex [cephalexin]   Review of Systems Review of Systems  Constitutional: Negative for activity change.       All ROS Neg except as noted in HPI  HENT: Negative for nosebleeds.   Eyes: Negative for photophobia and discharge.  Respiratory: Negative for cough, shortness of breath and wheezing.   Cardiovascular: Negative for chest pain and palpitations.  Gastrointestinal: Negative for abdominal pain and blood in stool.  Genitourinary: Negative for dysuria, frequency and hematuria.  Musculoskeletal: Positive for arthralgias and back pain. Negative for neck pain.       Hip pain and ankle pain  Skin: Negative.   Neurological: Negative for dizziness, seizures and speech difficulty.  Psychiatric/Behavioral: Negative for confusion and hallucinations.     Physical Exam Updated Vital Signs BP (!) 165/94 (BP Location: Left Arm)   Pulse 93   Temp 98.5 F (36.9 C) (Oral)   Resp 18   Ht 6' (1.829 m)   Wt 97.5 kg   SpO2 100%   BMI 29.16 kg/m   Physical Exam Vitals signs and nursing note reviewed.  Constitutional:      Appearance: He is well-developed. He is not toxic-appearing.  HENT:     Head: Normocephalic.     Right Ear: Tympanic membrane and external ear normal.     Left Ear: Tympanic membrane and external ear normal.  Eyes:     General: Lids are normal.     Pupils: Pupils are equal, round, and reactive to light.  Neck:     Musculoskeletal: Normal range of  motion and neck supple.     Vascular: No carotid bruit.  Cardiovascular:     Rate and Rhythm: Normal rate and regular rhythm.     Pulses: Normal pulses.     Heart sounds: Normal heart sounds.  Pulmonary:     Effort: No respiratory distress.     Breath sounds: Normal breath  sounds.  Abdominal:     General: Bowel sounds are normal.     Palpations: Abdomen is soft.     Tenderness: There is no abdominal tenderness. There is no guarding.  Musculoskeletal:     Left ankle: He exhibits decreased range of motion. He exhibits no deformity. Tenderness. Lateral malleolus tenderness found. Achilles tendon normal.     Lumbar back: He exhibits pain.       Back:       Feet:     Comments: There is pain of the midline of the back radiating to the left buttocks and hip.  Worse with palpation.  There is good range of motion of the left hip there is no hot areas appreciated.  No palpable inguinal nodes.  There is good range of motion of the left knee.  Mild crepitus present.  No effusion appreciated.  There is an area of mild increased redness of the lateral portion of the left ankle.  Extremely tender to even light touch.  No deformity appreciated on minimal examination.  Achilles tendon is intact.  Capillary refill is less than 2 seconds.  The dorsalis pedis pulses 2+.  Lymphadenopathy:     Head:     Right side of head: No submandibular adenopathy.     Left side of head: No submandibular adenopathy.     Cervical: No cervical adenopathy.  Skin:    General: Skin is warm and dry.  Neurological:     Mental Status: He is alert and oriented to person, place, and time.     Cranial Nerves: No cranial nerve deficit.     Sensory: No sensory deficit.  Psychiatric:        Speech: Speech normal.      ED Treatments / Results  Labs (all labs ordered are listed, but only abnormal results are displayed) Labs Reviewed - No data to display  EKG None  Radiology No results  found.  Procedures Procedures (including critical care time)  Medications Ordered in ED Medications - No data to display   Initial Impression / Assessment and Plan / ED Course  I have reviewed the triage vital signs and the nursing notes.  Pertinent labs & imaging results that were available during my care of the patient were reviewed by me and considered in my medical decision making (see chart for details).       Final Clinical Impressions(s) / ED Diagnoses MDM  Blood pressure is elevated at 165/94, otherwise vital signs within normal limits.  Pulse oximetry is 100% on room air.  Within normal limits by my interpretation.  The sick complete blood count is well within normal limits.  The patient denies use of any IV drug use. Patient states he has a history of gout.  Uric acid at this time is 7.2.  The patient states his pain is improving after medication. I have informed the patient of the results of his test.  I have also informed the patient that this pain seems to be related to osteoarthritis involving the lumbar spine, and also probably arthritis, and or strain involving the left ankle.  Patient will be treated with a Mobic 7.5 for 5 days.  Prescription also given for Ultram 50mg  every 6 hours as needed for pain not improved by Tylenol extra strength.  Patient is to follow-up with his physician concerning this discomfort.   Final diagnoses:  Osteoarthritis of lumbar spine, unspecified spinal osteoarthritis complication status  Arthritis of left ankle    ED  Discharge Orders         Ordered    traMADol (ULTRAM) 50 MG tablet  Every 6 hours PRN     12/02/18 1053    meloxicam (MOBIC) 7.5 MG tablet  Daily     12/02/18 1053           Lily Kocher, PA-C 12/03/18 0900    Francine Graven, DO 12/04/18 1507

## 2018-12-02 NOTE — Discharge Instructions (Addendum)
Your blood pressure is elevated at 165/94.  Please have this rechecked soon.  The remainder of your vital signs are within normal limits.  I have checked your blood work.  There is no evidence of elevation in your white blood cell count to indicate an infection.  Your uric acid level was within normal limits.  Your examination favors an inflammatory attack.  Review of your recent hip x-rays show no evidence of any fracture, dislocation, and no excessive areas of arthritis.  Your back examination and review seem to indicate some probable arthritis related issues and I am concerned that your pain is coming from your back.  The examination of your ankle favors arthritis changes in your ankle.  Please continue to follow the dietary restrictions for gout.  Please keep your ankle and your lower back area warm.  A heating pad to your back may be helpful.  Use meloxicam daily with a meal for the next 5 days.  Use Tylenol extra strength every 4 hours for pain.  May use Ultram for more severe pain. This medication may cause drowsiness. Please do not drink, drive, or participate in activity that requires concentration while taking this medication.  See your primary doctor this week for follow-up of your ankle and hip/back area pain.

## 2018-12-05 ENCOUNTER — Encounter: Payer: Self-pay | Admitting: Family Medicine

## 2018-12-05 ENCOUNTER — Ambulatory Visit (INDEPENDENT_AMBULATORY_CARE_PROVIDER_SITE_OTHER): Payer: PPO | Admitting: Family Medicine

## 2018-12-05 VITALS — BP 168/84 | HR 88 | Temp 98.4°F | Resp 16 | Ht 72.0 in | Wt 210.0 lb

## 2018-12-05 DIAGNOSIS — M1000 Idiopathic gout, unspecified site: Secondary | ICD-10-CM

## 2018-12-05 MED ORDER — DOXAZOSIN MESYLATE 4 MG PO TABS: 4.0000 mg | ORAL_TABLET | Freq: Every day | ORAL | 3 refills | Status: DC

## 2018-12-05 MED ORDER — PREDNISONE 20 MG PO TABS: ORAL_TABLET | ORAL | 0 refills | Status: DC

## 2018-12-05 MED ORDER — METHYLPREDNISOLONE ACETATE 80 MG/ML IJ SUSP
80.0000 mg | Freq: Once | INTRAMUSCULAR | Status: AC
  Administered 2018-12-05: 80 mg via INTRAMUSCULAR

## 2018-12-05 NOTE — Progress Notes (Signed)
Subjective:    Patient ID: Brandon Buck, male    DOB: 1961/08/01, 58 y.o.   MRN: [id number]  Was seen in the ER 2/8 for ankle pain and low back pain and was given ultram and mobic.  Patient has chronic kidney disease.  Most recent creatinine was 1.79.  Therefore I would like to try to avoid NSAIDs.  The patient's left ankle is swollen, it is tender to palpation.  It is exquisitely tender to the touch.  He hates to put a sock on.  It is also slightly erythematous on both the medial and lateral aspects of the left ankle.  There is a moderate effusion.  He has pain with flexion and extension.  Hurts to bear weight.  He denies any fevers or chills.  He has a history of gout.  His uric acid level was 7.2 in the emergency room.  Tramadol and Mobic have not helped his pain at all.  His blood pressure today is elevated although he is in significant pain.  However he recently discontinued lisinopril due to hyperkalemia under the care of his nephrologist.  He is following up in for repeat blood work next week to monitor his potassium and recheck his blood pressure  Past Medical History:  Diagnosis Date  . Cancer ([place])    Kidney  . Chronic back pain   . Chronic hip pain, left   . CKD (chronic kidney disease) stage 3, GFR 30-59 ml/min (HCC)   . Gout   . Heart disease   . High blood pressure   . History of PTCA 2    2 stents  . Hypertension   . Kidney disease   . Proteinuria   . Renal insufficiency   . Tennis elbow    Past Surgical History:  Procedure Laterality Date  . Heart catherization    . HERNIA REPAIR    . HIP SURGERY    . Kidney removed     Current Outpatient Medications on File Prior to Visit  Medication Sig Dispense Refill  . allopurinol (ZYLOPRIM) 100 MG tablet TAKE 2 TABLETS BY MOUTH DAILY. 60 tablet 0  . ALPRAZolam (XANAX) 0.5 MG tablet TAKE 1 TABLET BY MOUTH THREE TIMES DAILY AS NEEDED FOR ANXIETY. 30 tablet 0  . aspirin 81 MG tablet Take 81 mg by mouth daily.    Marland Kitchen  atorvastatin (LIPITOR) 80 MG tablet TAKE (1) TABLET BY MOUTH ONCE DAILY. 90 tablet 0  . clopidogrel (PLAVIX) 75 MG tablet TAKE 1 TABLET BY MOUTH ONCE A DAY. 90 tablet 1  . diltiazem (CARDIZEM CD) 240 MG 24 hr capsule TAKE (1) CAPSULE BY MOUTH DAILY. 30 capsule 0  . HYDROcodone-acetaminophen (NORCO/VICODIN) 5-325 MG tablet TAKE (1) TABLET BY MOUTH (3) TIMES DAILY AS NEEDED FOR MODERATE PAIN. 90 tablet 0  . indomethacin (INDOCIN) 25 MG capsule Take 25 mg by mouth daily as needed (gout).     Marland Kitchen lidocaine (LIDODERM) 5 % Place 1 patch onto the skin daily. Place one patch on the skin daily. Remove & Discard patch within 12 hours of use. 5 patch 0  . meloxicam (MOBIC) 7.5 MG tablet Take 1 tablet (7.5 mg total) by mouth daily. Take with food 5 tablet 0  . metoprolol tartrate (LOPRESSOR) 50 MG tablet TAKE ONE HALF TABLET BY MOUTH TWICE DAILY. 90 tablet 1  . nitroGLYCERIN (NITROSTAT) 0.4 MG SL tablet Place 0.4 mg under the tongue every 5 (five) minutes as needed.    . pantoprazole (PROTONIX) 40  MG tablet Take 1 tablet (40 mg total) by mouth 2 (two) times daily. 60 tablet 5  . traMADol (ULTRAM) 50 MG tablet Take 1 tablet (50 mg total) by mouth every 6 (six) hours as needed. 12 tablet 0   No current facility-administered medications on file prior to visit.    Allergies  Allergen Reactions  . Keflex [Cephalexin] Rash   Social History   Socioeconomic History  . Marital status: Married    Spouse name: Not on file  . Number of children: Not on file  . Years of education: Not on file  . Highest education level: Not on file  Occupational History  . Not on file  Social Needs  . Financial resource strain: Not on file  . Food insecurity:    Worry: Not on file    Inability: Not on file  . Transportation needs:    Medical: Not on file    Non-medical: Not on file  Tobacco Use  . Smoking status: Former Smoker    Packs/day: 1.50    Years: 32.00    Pack years: 48.00    Types: Cigarettes    Start date:  01/26/1971    Last attempt to quit: 01/26/2003    Years since quitting: 15.8  . Smokeless tobacco: Never Used  Substance and Sexual Activity  . Alcohol use: Yes    Alcohol/week: 0.0 standard drinks    Comment: occ  . Drug use: No  . Sexual activity: Not on file  Lifestyle  . Physical activity:    Days per week: Not on file    Minutes per session: Not on file  . Stress: Not on file  Relationships  . Social connections:    Talks on phone: Not on file    Gets together: Not on file    Attends religious service: Not on file    Active member of club or organization: Not on file    Attends meetings of clubs or organizations: Not on file    Relationship status: Not on file  . Intimate partner violence:    Fear of current or ex partner: Not on file    Emotionally abused: Not on file    Physically abused: Not on file    Forced sexual activity: Not on file  Other Topics Concern  . Not on file  Social History Narrative  . Not on file      Review of Systems  All other systems reviewed and are negative.      Objective:   Physical Exam  Constitutional: He appears well-developed and well-nourished. No distress.  Neck: Neck supple.  Cardiovascular: Normal rate, regular rhythm and normal heart sounds.  Pulmonary/Chest: Effort normal and breath sounds normal. No respiratory distress. He has no wheezes. He has no rales.  Abdominal: Bowel sounds are normal. Hernia confirmed negative in the right inguinal area and confirmed negative in the left inguinal area.  Genitourinary:    Testes and penis normal.  Right testis shows no mass and no tenderness. Left testis shows no mass and no tenderness.  Musculoskeletal:     Left ankle: He exhibits decreased range of motion and swelling. Tenderness. Lateral malleolus and medial malleolus tenderness found.     Left foot: Decreased range of motion. Tenderness, bony tenderness and swelling present.  Lymphadenopathy:       Right: No inguinal adenopathy  present.       Left: No inguinal adenopathy present.  Skin: He is not diaphoretic.  Vitals reviewed.  Assessment & Plan:  Acute idiopathic gout, unspecified site - Plan: predniSONE (DELTASONE) 20 MG tablet  I believe the patient having an acute flare of gout in his left ankle.  Begin Depo-Medrol 80 mg IM x1 now and start prednisone taper pack tomorrow.  His blood pressure significantly elevated as it was in the hospital since stopping lisinopril.  Given his history of hyperkalemia and chronic kidney disease and coupled with the medication he is already taking, I have decided to add doxazosin 4 mg a day and recheck blood pressure in 3 weeks.  Follow-up as previously planned with his nephrologist to monitor his kidney function.  Discontinue meloxicam due to his chronic kidney disease

## 2018-12-05 NOTE — Addendum Note (Signed)
Addended by: Shary Decamp B on: 12/05/2018 11:44 AM   Modules accepted: Orders

## 2018-12-08 ENCOUNTER — Other Ambulatory Visit: Payer: Self-pay | Admitting: Family Medicine

## 2018-12-15 DIAGNOSIS — Z1159 Encounter for screening for other viral diseases: Secondary | ICD-10-CM | POA: Diagnosis not present

## 2018-12-15 DIAGNOSIS — D509 Iron deficiency anemia, unspecified: Secondary | ICD-10-CM | POA: Diagnosis not present

## 2018-12-15 DIAGNOSIS — N183 Chronic kidney disease, stage 3 (moderate): Secondary | ICD-10-CM | POA: Diagnosis not present

## 2018-12-15 DIAGNOSIS — R809 Proteinuria, unspecified: Secondary | ICD-10-CM | POA: Diagnosis not present

## 2018-12-15 DIAGNOSIS — I10 Essential (primary) hypertension: Secondary | ICD-10-CM | POA: Diagnosis not present

## 2018-12-15 DIAGNOSIS — Z79899 Other long term (current) drug therapy: Secondary | ICD-10-CM | POA: Diagnosis not present

## 2018-12-15 DIAGNOSIS — E559 Vitamin D deficiency, unspecified: Secondary | ICD-10-CM | POA: Diagnosis not present

## 2018-12-20 DIAGNOSIS — N183 Chronic kidney disease, stage 3 (moderate): Secondary | ICD-10-CM | POA: Diagnosis not present

## 2018-12-20 DIAGNOSIS — I10 Essential (primary) hypertension: Secondary | ICD-10-CM | POA: Diagnosis not present

## 2018-12-20 DIAGNOSIS — R809 Proteinuria, unspecified: Secondary | ICD-10-CM | POA: Diagnosis not present

## 2018-12-20 DIAGNOSIS — E875 Hyperkalemia: Secondary | ICD-10-CM | POA: Diagnosis not present

## 2018-12-25 ENCOUNTER — Other Ambulatory Visit: Payer: Self-pay | Admitting: Family Medicine

## 2018-12-25 DIAGNOSIS — F411 Generalized anxiety disorder: Secondary | ICD-10-CM

## 2018-12-25 NOTE — Telephone Encounter (Signed)
Pt is requesting refill on Xanax  & Hydrocodone  LOV: 12/05/18  LRF:   11/30/18 & 11/28/18

## 2018-12-26 ENCOUNTER — Telehealth: Payer: Self-pay | Admitting: Family Medicine

## 2018-12-26 DIAGNOSIS — F411 Generalized anxiety disorder: Secondary | ICD-10-CM

## 2018-12-26 NOTE — Telephone Encounter (Signed)
Patient is requesting a refill on Hydrocodone & Xanax  LOV:   LRF:

## 2019-01-02 DIAGNOSIS — H5201 Hypermetropia, right eye: Secondary | ICD-10-CM | POA: Diagnosis not present

## 2019-01-02 DIAGNOSIS — H524 Presbyopia: Secondary | ICD-10-CM | POA: Diagnosis not present

## 2019-01-02 DIAGNOSIS — H52203 Unspecified astigmatism, bilateral: Secondary | ICD-10-CM | POA: Diagnosis not present

## 2019-01-02 DIAGNOSIS — H40013 Open angle with borderline findings, low risk, bilateral: Secondary | ICD-10-CM | POA: Diagnosis not present

## 2019-01-03 DIAGNOSIS — E875 Hyperkalemia: Secondary | ICD-10-CM | POA: Diagnosis not present

## 2019-01-23 ENCOUNTER — Other Ambulatory Visit: Payer: Self-pay | Admitting: Family Medicine

## 2019-01-23 DIAGNOSIS — F411 Generalized anxiety disorder: Secondary | ICD-10-CM

## 2019-01-23 NOTE — Telephone Encounter (Signed)
Ok to refill??  Last office visit 12/05/2018.  Last refill 12/25/2018 on both.

## 2019-01-29 ENCOUNTER — Ambulatory Visit (INDEPENDENT_AMBULATORY_CARE_PROVIDER_SITE_OTHER): Payer: PPO | Admitting: Family Medicine

## 2019-01-29 ENCOUNTER — Other Ambulatory Visit: Payer: Self-pay

## 2019-01-29 DIAGNOSIS — B9689 Other specified bacterial agents as the cause of diseases classified elsewhere: Secondary | ICD-10-CM | POA: Diagnosis not present

## 2019-01-29 DIAGNOSIS — J019 Acute sinusitis, unspecified: Secondary | ICD-10-CM

## 2019-01-29 MED ORDER — AMOXICILLIN-POT CLAVULANATE 875-125 MG PO TABS: 1.0000 | ORAL_TABLET | Freq: Two times a day (BID) | ORAL | 0 refills | Status: DC

## 2019-01-29 MED ORDER — FLUTICASONE PROPIONATE 50 MCG/ACT NA SUSP: 2.0000 | Freq: Every day | NASAL | 6 refills | Status: DC

## 2019-01-29 NOTE — Progress Notes (Signed)
Subjective:    Patient ID: Brandon Buck, male    DOB: Oct 05, 1961, 58 y.o.   MRN: [id number]  HPI  Patient is being seen today as a telephone visit.  He consents to be seen over the telephone.  Patient is currently at home.  I am currently in my office.  Phone call began at 2:00 PM.  Phone call concluded at 2:07 PM.  Patient states that 1 week ago, he was mowing his yard and weed eating.  He developed severe sinus congestion after due to allergies.  Wednesday of last week however he developed a throbbing pain in his right eye and also in his right cheek over his maxillary sinus.  He states that it aches and throbs constantly.  He also reports sneezing and rhinorrhea and profound head congestion despite taking a nasal spray over-the-counter.  He is also tried some over-the-counter allergy medicine with no relief.  He denies any cough.  He denies any fever.  He denies any shortness of breath or nausea or vomiting. Past Medical History:  Diagnosis Date  . Cancer ([place])    Kidney  . Chronic back pain   . Chronic hip pain, left   . CKD (chronic kidney disease) stage 3, GFR 30-59 ml/min (HCC)   . Gout   . Heart disease   . High blood pressure   . History of PTCA 2    2 stents  . Hypertension   . Kidney disease   . Proteinuria   . Renal insufficiency   . Tennis elbow    Past Surgical History:  Procedure Laterality Date  . Heart catherization    . HERNIA REPAIR    . HIP SURGERY    . Kidney removed     Current Outpatient Medications on File Prior to Visit  Medication Sig Dispense Refill  . allopurinol (ZYLOPRIM) 100 MG tablet TAKE 2 TABLETS BY MOUTH DAILY. 60 tablet 0  . ALPRAZolam (XANAX) 0.5 MG tablet TAKE 1 TABLET BY MOUTH THREE TIMES DAILY AS NEEDED FOR ANXIETY. 30 tablet 0  . aspirin 81 MG tablet Take 81 mg by mouth daily.    Marland Kitchen atorvastatin (LIPITOR) 80 MG tablet TAKE (1) TABLET BY MOUTH ONCE DAILY. 90 tablet 0  . clopidogrel (PLAVIX) 75 MG tablet TAKE 1 TABLET BY MOUTH ONCE A DAY.  90 tablet 1  . diltiazem (CARDIZEM CD) 240 MG 24 hr capsule TAKE (1) CAPSULE BY MOUTH DAILY. 30 capsule 0  . doxazosin (CARDURA) 4 MG tablet Take 1 tablet (4 mg total) by mouth daily. 30 tablet 3  . HYDROcodone-acetaminophen (NORCO/VICODIN) 5-325 MG tablet TAKE (1) TABLET BY MOUTH (3) TIMES DAILY AS NEEDED FOR MODERATE PAIN. 90 tablet 0  . indomethacin (INDOCIN) 25 MG capsule Take 25 mg by mouth daily as needed (gout).     Marland Kitchen lidocaine (LIDODERM) 5 % Place 1 patch onto the skin daily. Place one patch on the skin daily. Remove & Discard patch within 12 hours of use. 5 patch 0  . metoprolol tartrate (LOPRESSOR) 50 MG tablet TAKE ONE HALF TABLET BY MOUTH TWICE DAILY. 90 tablet 3  . nitroGLYCERIN (NITROSTAT) 0.4 MG SL tablet Place 0.4 mg under the tongue every 5 (five) minutes as needed.    . pantoprazole (PROTONIX) 40 MG tablet Take 1 tablet (40 mg total) by mouth 2 (two) times daily. 60 tablet 5  . predniSONE (DELTASONE) 20 MG tablet 3 tabs poqday 1-2, 2 tabs poqday 3-4, 1 tab poqday 5-6 12 tablet  0  . traMADol (ULTRAM) 50 MG tablet Take 1 tablet (50 mg total) by mouth every 6 (six) hours as needed. 12 tablet 0   No current facility-administered medications on file prior to visit.    Allergies  Allergen Reactions  . Keflex [Cephalexin] Rash   Social History   Socioeconomic History  . Marital status: Married    Spouse name: Not on file  . Number of children: Not on file  . Years of education: Not on file  . Highest education level: Not on file  Occupational History  . Not on file  Social Needs  . Financial resource strain: Not on file  . Food insecurity:    Worry: Not on file    Inability: Not on file  . Transportation needs:    Medical: Not on file    Non-medical: Not on file  Tobacco Use  . Smoking status: Former Smoker    Packs/day: 1.50    Years: 32.00    Pack years: 48.00    Types: Cigarettes    Start date: 01/26/1971    Last attempt to quit: 01/26/2003    Years since  quitting: 16.0  . Smokeless tobacco: Never Used  Substance and Sexual Activity  . Alcohol use: Yes    Alcohol/week: 0.0 standard drinks    Comment: occ  . Drug use: No  . Sexual activity: Not on file  Lifestyle  . Physical activity:    Days per week: Not on file    Minutes per session: Not on file  . Stress: Not on file  Relationships  . Social connections:    Talks on phone: Not on file    Gets together: Not on file    Attends religious service: Not on file    Active member of club or organization: Not on file    Attends meetings of clubs or organizations: Not on file    Relationship status: Not on file  . Intimate partner violence:    Fear of current or ex partner: Not on file    Emotionally abused: Not on file    Physically abused: Not on file    Forced sexual activity: Not on file  Other Topics Concern  . Not on file  Social History Narrative  . Not on file       Review of Systems  All other systems reviewed and are negative.      Objective:   Physical Exam No physical exam could be performed today as this was a telephone visit       Assessment & Plan:  Acute bacterial rhinosinusitis - Plan: amoxicillin-clavulanate (AUGMENTIN) 875-125 MG tablet, fluticasone (FLONASE) 50 MCG/ACT nasal spray  Symptoms sound like the patient originally suffering due to allergies however given the severity of the pain behind his right eye and in his maxillary sinus I do believe he has developed a bacterial sinus infection.  We will start the patient on Flonase 2 sprays each nostril daily and add Augmentin 875 mg p.o. twice daily for 10 days.  Reassess in 1 week if no better or recheck immediately if worsening.

## 2019-01-31 ENCOUNTER — Telehealth: Payer: Self-pay | Admitting: Family Medicine

## 2019-01-31 NOTE — Telephone Encounter (Signed)
Patient is calling to say he cant walk his gout is so bad he said he cant walk to come in, he said in the past dr pickard had called him in prednisone would like to know if this can be called in for him If possible without coming in  [place]

## 2019-02-01 ENCOUNTER — Other Ambulatory Visit: Payer: Self-pay | Admitting: Family Medicine

## 2019-02-01 DIAGNOSIS — M1000 Idiopathic gout, unspecified site: Secondary | ICD-10-CM

## 2019-02-01 MED ORDER — PREDNISONE 20 MG PO TABS: ORAL_TABLET | ORAL | 0 refills | Status: DC

## 2019-02-01 NOTE — Telephone Encounter (Signed)
I sent it to [facility]

## 2019-02-01 NOTE — Telephone Encounter (Signed)
Left message return call

## 2019-02-01 NOTE — Telephone Encounter (Signed)
Spoke with patient and informed him that Dr. Dennard Schaumann did send in Prednisone to [facility]. Patient verbalized understanding.

## 2019-02-09 ENCOUNTER — Other Ambulatory Visit: Payer: Self-pay | Admitting: Family Medicine

## 2019-02-19 ENCOUNTER — Other Ambulatory Visit: Payer: Self-pay | Admitting: Family Medicine

## 2019-02-19 DIAGNOSIS — F411 Generalized anxiety disorder: Secondary | ICD-10-CM

## 2019-02-20 MED ORDER — ALPRAZOLAM 0.5 MG PO TABS: 0.5000 mg | ORAL_TABLET | Freq: Three times a day (TID) | ORAL | 0 refills | Status: DC | PRN

## 2019-02-20 MED ORDER — HYDROCODONE-ACETAMINOPHEN 5-325 MG PO TABS: ORAL_TABLET | ORAL | 0 refills | Status: DC

## 2019-02-20 NOTE — Addendum Note (Signed)
Addended by: Jenna Luo T on: 02/20/2019 01:03 PM   Modules accepted: Orders

## 2019-02-20 NOTE — Telephone Encounter (Signed)
Ok to refill??  Last office visit 01/29/2019.  Last refill 01/23/2019 on both.

## 2019-02-20 NOTE — Telephone Encounter (Signed)
Patient is requesting a refill on Hydrocodone & Xanax  LOV: 01/29/19   LRF:  01/23/19

## 2019-02-23 ENCOUNTER — Telehealth: Payer: Self-pay | Admitting: Family Medicine

## 2019-02-23 ENCOUNTER — Other Ambulatory Visit: Payer: Self-pay | Admitting: Family Medicine

## 2019-02-23 MED ORDER — PREDNISONE 20 MG PO TABS: ORAL_TABLET | ORAL | 0 refills | Status: DC

## 2019-02-23 NOTE — Telephone Encounter (Signed)
I sent prednisone

## 2019-02-23 NOTE — Telephone Encounter (Signed)
Pt is having a gout flare up, wants [place] to call in medication to [facility]

## 2019-02-23 NOTE — Telephone Encounter (Signed)
See note

## 2019-02-26 NOTE — Telephone Encounter (Signed)
Left message return call

## 2019-02-26 NOTE — Telephone Encounter (Signed)
Spoke with patient and informed him that Prednisone was sent into the pharmacy. Patient verbalized understanding and stated that he picked it up on Friday.

## 2019-03-13 ENCOUNTER — Telehealth: Payer: Self-pay | Admitting: Family Medicine

## 2019-03-13 NOTE — Telephone Encounter (Signed)
Pt is having another gout flare up, he is concerned about all the steroids he has taken over the last two months.   He has questions about meds please call him back   Also wants refill on prednisone to [facility] for flare up.

## 2019-03-14 NOTE — Telephone Encounter (Signed)
Pt thinks he is having another gout flare in his left ankle this time however he is concerned about taking so much prednisone. He states that it hurts so bad he is having trouble walking. He is not taking the allopurinol nor the indomethacin prn.  Please advise.

## 2019-03-15 ENCOUNTER — Ambulatory Visit (INDEPENDENT_AMBULATORY_CARE_PROVIDER_SITE_OTHER): Payer: PPO | Admitting: Family Medicine

## 2019-03-15 DIAGNOSIS — M1000 Idiopathic gout, unspecified site: Secondary | ICD-10-CM | POA: Diagnosis not present

## 2019-03-15 MED ORDER — ALLOPURINOL 100 MG PO TABS: 200.0000 mg | ORAL_TABLET | Freq: Every day | ORAL | 11 refills | Status: DC

## 2019-03-15 MED ORDER — COLCHICINE 0.6 MG PO TABS: 0.6000 mg | ORAL_TABLET | Freq: Every day | ORAL | 1 refills | Status: DC

## 2019-03-15 MED ORDER — PREDNISONE 20 MG PO TABS: ORAL_TABLET | ORAL | 0 refills | Status: DC

## 2019-03-15 NOTE — Telephone Encounter (Signed)
Schedule phone visit °

## 2019-03-15 NOTE — Telephone Encounter (Signed)
Pt aware and apt made 

## 2019-03-15 NOTE — Progress Notes (Signed)
Subjective:    Patient ID: Brandon Buck, male    DOB: [birth year], 60 y.o.   MRN: [id number]  HPI  Patient is being seen today by telephone.  He is currently at home.  I am currently in my office.  He consents to be seen by telephone.  Phone call began at 61.  Phone call ended at 221.  Patient is concerned because he has had another gout flare.  This is the third time in 2 months.  The first gout flare occurred 2 months ago and was in his right first MTP joint.  It calm down rapidly with prednisone.  His second gout flare occurred 1 month ago in his left ankle.  The patient again had to take prednisone.  2 days ago, the patient developed sudden onset of pain in his left ankle again.  It is red hot and swollen.  He denies any falls or injuries that would explain it.  He is unable to bear weight on his ankle.  He denies any fevers or chills.  I reviewed his medication list with the patient.  He is supposed to be on allopurinol 200 mg a day.  However the patient states he realizes now he has not been taking that medication for over a year at least.  Previously it had worked well at preventing his gout flares however he is not sure when he stopped it. Past Medical History:  Diagnosis Date  . Cancer ([place])    Kidney  . Chronic back pain   . Chronic hip pain, left   . CKD (chronic kidney disease) stage 3, GFR 30-59 ml/min (HCC)   . Gout   . Heart disease   . High blood pressure   . History of PTCA 2    2 stents  . Hypertension   . Kidney disease   . Proteinuria   . Renal insufficiency   . Tennis elbow    Past Surgical History:  Procedure Laterality Date  . Heart catherization    . HERNIA REPAIR    . HIP SURGERY    . Kidney removed     Current Outpatient Medications on File Prior to Visit  Medication Sig Dispense Refill  . ALPRAZolam (XANAX) 0.5 MG tablet Take 1 tablet (0.5 mg total) by mouth 3 (three) times daily as needed. for anxiety 30 tablet 0  . aspirin 81 MG tablet Take 81 mg by  mouth daily.    Marland Kitchen atorvastatin (LIPITOR) 80 MG tablet TAKE (1) TABLET BY MOUTH ONCE DAILY. 90 tablet 0  . clopidogrel (PLAVIX) 75 MG tablet TAKE 1 TABLET BY MOUTH ONCE A DAY. 90 tablet 1  . diltiazem (CARDIZEM CD) 240 MG 24 hr capsule TAKE (1) CAPSULE BY MOUTH DAILY. 90 capsule 2  . doxazosin (CARDURA) 4 MG tablet Take 1 tablet (4 mg total) by mouth daily. 30 tablet 3  . fluticasone (FLONASE) 50 MCG/ACT nasal spray Place 2 sprays into both nostrils daily. 16 g 6  . HYDROcodone-acetaminophen (NORCO/VICODIN) 5-325 MG tablet TAKE (1) TABLET BY MOUTH (3) TIMES DAILY AS NEEDED FOR MODERATE PAIN. 90 tablet 0  . lidocaine (LIDODERM) 5 % Place 1 patch onto the skin daily. Place one patch on the skin daily. Remove & Discard patch within 12 hours of use. 5 patch 0  . metoprolol tartrate (LOPRESSOR) 50 MG tablet TAKE ONE HALF TABLET BY MOUTH TWICE DAILY. 90 tablet 3  . nitroGLYCERIN (NITROSTAT) 0.4 MG SL tablet Place 0.4 mg under the tongue  every 5 (five) minutes as needed.    . pantoprazole (PROTONIX) 40 MG tablet Take 1 tablet (40 mg total) by mouth 2 (two) times daily. 60 tablet 5  . traMADol (ULTRAM) 50 MG tablet Take 1 tablet (50 mg total) by mouth every 6 (six) hours as needed. 12 tablet 0   No current facility-administered medications on file prior to visit.    Allergies  Allergen Reactions  . Keflex [Cephalexin] Rash   Social History   Socioeconomic History  . Marital status: Married    Spouse name: Not on file  . Number of children: Not on file  . Years of education: Not on file  . Highest education level: Not on file  Occupational History  . Not on file  Social Needs  . Financial resource strain: Not on file  . Food insecurity:    Worry: Not on file    Inability: Not on file  . Transportation needs:    Medical: Not on file    Non-medical: Not on file  Tobacco Use  . Smoking status: Former Smoker    Packs/day: 1.50    Years: 32.00    Pack years: 48.00    Types: Cigarettes     Start date: 01/26/1971    Last attempt to quit: 01/26/2003    Years since quitting: 16.1  . Smokeless tobacco: Never Used  Substance and Sexual Activity  . Alcohol use: Yes    Alcohol/week: 0.0 standard drinks    Comment: occ  . Drug use: No  . Sexual activity: Not on file  Lifestyle  . Physical activity:    Days per week: Not on file    Minutes per session: Not on file  . Stress: Not on file  Relationships  . Social connections:    Talks on phone: Not on file    Gets together: Not on file    Attends religious service: Not on file    Active member of club or organization: Not on file    Attends meetings of clubs or organizations: Not on file    Relationship status: Not on file  . Intimate partner violence:    Fear of current or ex partner: Not on file    Emotionally abused: Not on file    Physically abused: Not on file    Forced sexual activity: Not on file  Other Topics Concern  . Not on file  Social History Narrative  . Not on file     Review of Systems  All other systems reviewed and are negative.      Objective:   Physical Exam  Physical exam could not be performed today as this was a telephone visit      Assessment & Plan:  Acute idiopathic gout, unspecified site - Plan: allopurinol (ZYLOPRIM) 100 MG tablet, colchicine 0.6 MG tablet, predniSONE (DELTASONE) 20 MG tablet  This is the patient's third gout flare in 2 months.  I agree with the patient that repeated use of prednisone to treat attacks is not the ideal method to control this.  Instead we need to focus better on prevention.  I explained to the patient that was the purpose of the allopurinol.  I believe the reason his flares have become more frequent is because he is inadvertently discontinued the allopurinol.  Therefore the patient will take prednisone taper pack again to calm down this gout flare.  In 1 week, once the flare has calmed down, the patient will begin allopurinol 200 mg a  day along with  colchicine 0.6 mg daily for 2 months.  At the conclusion of 2 months he will discontinue the colchicine and then maintained only on the allopurinol 200 mg a day unless he continues to have gout attacks at which point we will need to consider changing the dose of the allopurinol possibly switching to Uloric.  Patient was given an opportunity to ask any questions and repeated the plan back to me and understands the purpose of each of the 3 medicines he will be taking it

## 2019-03-26 ENCOUNTER — Other Ambulatory Visit: Payer: Self-pay | Admitting: Family Medicine

## 2019-03-26 DIAGNOSIS — F411 Generalized anxiety disorder: Secondary | ICD-10-CM

## 2019-03-27 NOTE — Telephone Encounter (Signed)
Requested Prescriptions   Pending Prescriptions Disp Refills  . HYDROcodone-acetaminophen (NORCO/VICODIN) 5-325 MG tablet [Pharmacy Med Name: HYDROCODONE/APAP 5/325MG ] 90 tablet 0    Sig: TAKE (1) TABLET BY MOUTH (3) TIMES DAILY AS NEEDED FOR MODERATE PAIN.  Marland Kitchen ALPRAZolam (XANAX) 0.5 MG tablet [Pharmacy Med Name: ALPRAZOLAM 0.5 MG TABLET] 30 tablet 0    Sig: TAKE 1 TABLET BY MOUTH THREE TIMES DAILY AS NEEDED FOR ANXIETY.   Last OV 03/15/2019 Last written 02/20/2019

## 2019-04-06 DIAGNOSIS — S0502XA Injury of conjunctiva and corneal abrasion without foreign body, left eye, initial encounter: Secondary | ICD-10-CM | POA: Diagnosis not present

## 2019-04-06 DIAGNOSIS — S0512XA Contusion of eyeball and orbital tissues, left eye, initial encounter: Secondary | ICD-10-CM | POA: Diagnosis not present

## 2019-04-13 DIAGNOSIS — E559 Vitamin D deficiency, unspecified: Secondary | ICD-10-CM | POA: Diagnosis not present

## 2019-04-13 DIAGNOSIS — R809 Proteinuria, unspecified: Secondary | ICD-10-CM | POA: Diagnosis not present

## 2019-04-13 DIAGNOSIS — N183 Chronic kidney disease, stage 3 (moderate): Secondary | ICD-10-CM | POA: Diagnosis not present

## 2019-04-13 DIAGNOSIS — I10 Essential (primary) hypertension: Secondary | ICD-10-CM | POA: Diagnosis not present

## 2019-04-13 DIAGNOSIS — Z79899 Other long term (current) drug therapy: Secondary | ICD-10-CM | POA: Diagnosis not present

## 2019-04-13 DIAGNOSIS — D509 Iron deficiency anemia, unspecified: Secondary | ICD-10-CM | POA: Diagnosis not present

## 2019-04-23 ENCOUNTER — Other Ambulatory Visit: Payer: Self-pay | Admitting: Family Medicine

## 2019-04-23 DIAGNOSIS — F411 Generalized anxiety disorder: Secondary | ICD-10-CM

## 2019-04-23 NOTE — Telephone Encounter (Signed)
Requested Prescriptions   Pending Prescriptions Disp Refills  . ALPRAZolam (XANAX) 0.5 MG tablet [Pharmacy Med Name: ALPRAZOLAM 0.5 MG TABLET] 30 tablet 0    Sig: TAKE 1 TABLET BY MOUTH THREE TIMES DAILY AS NEEDED FOR ANXIETY.  Marland Kitchen HYDROcodone-acetaminophen (NORCO/VICODIN) 5-325 MG tablet [Pharmacy Med Name: HYDROCODONE/APAP 5/325MG ] 90 tablet 0    Sig: TAKE (1) TABLET BY MOUTH (3) TIMES DAILY AS NEEDED FOR MODERATE PAIN.   Last OV 03/15/2019 Last written 03/27/2019

## 2019-05-10 ENCOUNTER — Ambulatory Visit: Payer: PPO | Admitting: Family Medicine

## 2019-05-25 ENCOUNTER — Other Ambulatory Visit: Payer: Self-pay | Admitting: Family Medicine

## 2019-05-25 DIAGNOSIS — F411 Generalized anxiety disorder: Secondary | ICD-10-CM

## 2019-05-28 NOTE — Telephone Encounter (Signed)
Ok to refill??  Last office visit 03/15/2019.  Last refill 04/23/2019 on both.

## 2019-06-27 ENCOUNTER — Other Ambulatory Visit: Payer: Self-pay | Admitting: Family Medicine

## 2019-06-27 DIAGNOSIS — F411 Generalized anxiety disorder: Secondary | ICD-10-CM

## 2019-06-28 NOTE — Telephone Encounter (Signed)
Requested Prescriptions   Pending Prescriptions Disp Refills  . ALPRAZolam (XANAX) 0.5 MG tablet [Pharmacy Med Name: ALPRAZOLAM 0.5 MG TABLET] 30 tablet 0    Sig: TAKE 1 TABLET BY MOUTH THREE TIMES DAILY AS NEEDED FOR ANXIETY.  Marland Kitchen HYDROcodone-acetaminophen (NORCO/VICODIN) 5-325 MG tablet [Pharmacy Med Name: HYDROCODONE/APAP 5/325 TAB] 90 tablet 0    Sig: TAKE (1) TABLET BY MOUTH (3) TIMES DAILY AS NEEDED FOR MODERATE PAIN.  Marland Kitchen pantoprazole (PROTONIX) 40 MG tablet [Pharmacy Med Name: PANTOPRAZOLE SOD DR 40 MG TAB] 60 tablet 0    Sig: TAKE ONE TABLET BY MOUTH TWICE DAILY.    Last OV 03/15/2019  Last written 05/28/2019

## 2019-07-13 ENCOUNTER — Other Ambulatory Visit: Payer: Self-pay | Admitting: Family Medicine

## 2019-07-24 ENCOUNTER — Other Ambulatory Visit: Payer: Self-pay | Admitting: Family Medicine

## 2019-07-24 DIAGNOSIS — F411 Generalized anxiety disorder: Secondary | ICD-10-CM

## 2019-07-24 NOTE — Telephone Encounter (Signed)
Patient is requesting a refill on Hydrocodone   LOV: 03/15/19  LRF:   06/28/19

## 2019-07-31 ENCOUNTER — Other Ambulatory Visit: Payer: Self-pay | Admitting: Family Medicine

## 2019-08-24 ENCOUNTER — Other Ambulatory Visit: Payer: Self-pay | Admitting: Family Medicine

## 2019-08-24 DIAGNOSIS — F411 Generalized anxiety disorder: Secondary | ICD-10-CM

## 2019-08-24 NOTE — Telephone Encounter (Signed)
Ok to refill??  Last office visit 03/15/2019.  Last refill 07/26/2019 on both.

## 2019-09-04 ENCOUNTER — Other Ambulatory Visit: Payer: Self-pay | Admitting: Family Medicine

## 2019-09-10 ENCOUNTER — Other Ambulatory Visit: Payer: Self-pay | Admitting: Family Medicine

## 2019-09-21 ENCOUNTER — Other Ambulatory Visit: Payer: Self-pay | Admitting: Family Medicine

## 2019-09-21 DIAGNOSIS — F411 Generalized anxiety disorder: Secondary | ICD-10-CM

## 2019-09-24 NOTE — Telephone Encounter (Signed)
Ok to refill??  Last office visit 03/15/2019.  Last refill 08/24/2019 on both.

## 2019-10-05 DIAGNOSIS — I10 Essential (primary) hypertension: Secondary | ICD-10-CM | POA: Insufficient documentation

## 2019-10-05 DIAGNOSIS — E875 Hyperkalemia: Secondary | ICD-10-CM | POA: Insufficient documentation

## 2019-10-09 DIAGNOSIS — I10 Essential (primary) hypertension: Secondary | ICD-10-CM | POA: Diagnosis not present

## 2019-10-09 DIAGNOSIS — N1831 Chronic kidney disease, stage 3a: Secondary | ICD-10-CM | POA: Diagnosis not present

## 2019-10-09 DIAGNOSIS — D631 Anemia in chronic kidney disease: Secondary | ICD-10-CM | POA: Diagnosis not present

## 2019-10-09 DIAGNOSIS — R809 Proteinuria, unspecified: Secondary | ICD-10-CM | POA: Diagnosis not present

## 2019-10-09 DIAGNOSIS — Z79899 Other long term (current) drug therapy: Secondary | ICD-10-CM | POA: Diagnosis not present

## 2019-10-09 DIAGNOSIS — E559 Vitamin D deficiency, unspecified: Secondary | ICD-10-CM | POA: Diagnosis not present

## 2019-10-15 DIAGNOSIS — I129 Hypertensive chronic kidney disease with stage 1 through stage 4 chronic kidney disease, or unspecified chronic kidney disease: Secondary | ICD-10-CM | POA: Diagnosis not present

## 2019-10-15 DIAGNOSIS — N1831 Chronic kidney disease, stage 3a: Secondary | ICD-10-CM | POA: Diagnosis not present

## 2019-10-15 DIAGNOSIS — R809 Proteinuria, unspecified: Secondary | ICD-10-CM | POA: Diagnosis not present

## 2019-10-15 DIAGNOSIS — E559 Vitamin D deficiency, unspecified: Secondary | ICD-10-CM | POA: Diagnosis not present

## 2019-10-22 ENCOUNTER — Other Ambulatory Visit: Payer: Self-pay | Admitting: Family Medicine

## 2019-10-22 DIAGNOSIS — F411 Generalized anxiety disorder: Secondary | ICD-10-CM

## 2019-10-22 NOTE — Telephone Encounter (Signed)
Ok to refill??  Last office visit 03/15/2019.  Last refill on Hydrocodone/APAP 09/24/2019.  Last refill on Xanax 09/24/2019.

## 2019-10-26 HISTORY — PX: PROSTATE BIOPSY: SHX241

## 2019-11-03 ENCOUNTER — Other Ambulatory Visit: Payer: Self-pay | Admitting: Family Medicine

## 2019-11-21 ENCOUNTER — Other Ambulatory Visit: Payer: Self-pay | Admitting: Family Medicine

## 2019-11-21 DIAGNOSIS — F411 Generalized anxiety disorder: Secondary | ICD-10-CM

## 2019-11-21 NOTE — Telephone Encounter (Signed)
Ok to refill??  Last office visit 5/21/220.  Last refill 10/22/2019 on both.

## 2019-11-30 ENCOUNTER — Telehealth: Payer: Self-pay | Admitting: Cardiovascular Disease

## 2019-11-30 NOTE — Telephone Encounter (Signed)
Virtual Visit Pre-Appointment Phone Call  "(Name), I am calling you today to discuss your upcoming appointment. We are currently trying to limit exposure to the virus that causes COVID-19 by seeing patients at home rather than in the office."  1. "What is the BEST phone number to call the day of the visit?" - [phone]  2. "Do you have or have access to (through a family member/friend) a smartphone with video capability that we can use for your visit?" a. If yes - list this number in appt notes as "cell" (if different from BEST phone #) and list the appointment type as a VIDEO visit in appointment notes b. If no - list the appointment type as a PHONE visit in appointment notes  3. Confirm consent - "In the setting of the current Covid19 crisis, you are scheduled for a (phone or video) visit with your provider on (date) at (time).  Just as we do with many in-office visits, in order for you to participate in this visit, we must obtain consent.  If you'd like, I can send this to your mychart (if signed up) or email for you to review.  Otherwise, I can obtain your verbal consent now.  All virtual visits are billed to your insurance company just like a normal visit would be.  By agreeing to a virtual visit, we'd like you to understand that the technology does not allow for your provider to perform an examination, and thus may limit your provider's ability to fully assess your condition. If your provider identifies any concerns that need to be evaluated in person, we will make arrangements to do so.  Finally, though the technology is pretty good, we cannot assure that it will always work on either your or our end, and in the setting of a video visit, we may have to convert it to a phone-only visit.  In either situation, we cannot ensure that we have a secure connection.  Are you willing to proceed?" STAFF: Did the patient verbally acknowledge consent to telehealth visit? Document YES/NO here:  YES  4. Advise patient to be prepared - "Two hours prior to your appointment, go ahead and check your blood pressure, pulse, oxygen saturation, and your weight (if you have the equipment to check those) and write them all down. When your visit starts, your provider will ask you for this information. If you have an Apple Watch or Kardia device, please plan to have heart rate information ready on the day of your appointment. Please have a pen and paper handy nearby the day of the visit as well."  5. Give patient instructions for MyChart download to smartphone OR Doximity/Doxy.me as below if video visit (depending on what platform provider is using)  6. Inform patient they will receive a phone call 15 minutes prior to their appointment time (may be from unknown caller ID) so they should be prepared to answer    TELEPHONE CALL NOTE  STEFANO TRULSON has been deemed a candidate for a follow-up tele-health visit to limit community exposure during the Covid-19 pandemic. I spoke with the patient via phone to ensure availability of phone/video source, confirm preferred email & phone number, and discuss instructions and expectations.  I reminded Brandon Buck to be prepared with any vital sign and/or heart rhythm information that could potentially be obtained via home monitoring, at the time of his visit. I reminded Brandon Buck to expect a phone call prior to his visit.  Vicky  T Slaughter 11/30/2019 4:16 PM   INSTRUCTIONS FOR DOWNLOADING THE MYCHART APP TO SMARTPHONE  - The patient must first make sure to have activated MyChart and know their login information - If Apple, go to [employer] and type in MyChart in the search bar and download the app. If Android, ask patient to go to [employer] and type in [place] in the search bar and download the app. The app is free but as with any other app downloads, their phone may require them to verify saved payment information or Apple/Android password.  -  The patient will need to then log into the app with their MyChart username and password, and select [facility] as their healthcare provider to link the account. When it is time for your visit, go to the MyChart app, find appointments, and click Begin Video Visit. Be sure to Select Allow for your device to access the Microphone and Camera for your visit. You will then be connected, and your provider will be with you shortly.  **If they have any issues connecting, or need assistance please contact MyChart service desk [phone])**  **If using a computer, in order to ensure the best quality for their visit they will need to use either of the following Internet Browsers: [employer], or Google Chrome**  IF USING DOXIMITY or DOXY.ME - The patient will receive a link just prior to their visit by text.     FULL LENGTH CONSENT FOR TELE-HEALTH VISIT   I hereby voluntarily request, consent and authorize [facility] and its employed or contracted physicians, physician assistants, nurse practitioners or other licensed health care professionals (the Practitioner), to provide me with telemedicine health care services (the "Services") as deemed necessary by the treating Practitioner. I acknowledge and consent to receive the Services by the Practitioner via telemedicine. I understand that the telemedicine visit will involve communicating with the Practitioner through live audiovisual communication technology and the disclosure of certain medical information by electronic transmission. I acknowledge that I have been given the opportunity to request an in-person assessment or other available alternative prior to the telemedicine visit and am voluntarily participating in the telemedicine visit.  I understand that I have the right to withhold or withdraw my consent to the use of telemedicine in the course of my care at any time, without affecting my right to future care or treatment, and that the  Practitioner or I may terminate the telemedicine visit at any time. I understand that I have the right to inspect all information obtained and/or recorded in the course of the telemedicine visit and may receive copies of available information for a reasonable fee.  I understand that some of the potential risks of receiving the Services via telemedicine include:  Marland Kitchen Delay or interruption in medical evaluation due to technological equipment failure or disruption; . Information transmitted may not be sufficient (e.g. poor resolution of images) to allow for appropriate medical decision making by the Practitioner; and/or  . In rare instances, security protocols could fail, causing a breach of personal health information.  Furthermore, I acknowledge that it is my responsibility to provide information about my medical history, conditions and care that is complete and accurate to the best of my ability. I acknowledge that Practitioner's advice, recommendations, and/or decision may be based on factors not within their control, such as incomplete or inaccurate data provided by me or distortions of diagnostic images or specimens that may result from electronic transmissions. I understand that the practice  of medicine is not an Chief Strategy Officer and that Practitioner makes no warranties or guarantees regarding treatment outcomes. I acknowledge that I will receive a copy of this consent concurrently upon execution via email to the email address I last provided but may also request a printed copy by calling the office of [facility].    I understand that my insurance will be billed for this visit.   I have read or had this consent read to me. . I understand the contents of this consent, which adequately explains the benefits and risks of the Services being provided via telemedicine.  . I have been provided ample opportunity to ask questions regarding this consent and the Services and have had my questions answered to my  satisfaction. . I give my informed consent for the services to be provided through the use of telemedicine in my medical care  By participating in this telemedicine visit I agree to the above.

## 2019-12-05 ENCOUNTER — Telehealth (INDEPENDENT_AMBULATORY_CARE_PROVIDER_SITE_OTHER): Payer: PPO | Admitting: Cardiovascular Disease

## 2019-12-05 ENCOUNTER — Encounter: Payer: Self-pay | Admitting: Cardiovascular Disease

## 2019-12-05 VITALS — Ht 72.0 in | Wt 228.0 lb

## 2019-12-05 DIAGNOSIS — N183 Chronic kidney disease, stage 3 unspecified: Secondary | ICD-10-CM

## 2019-12-05 DIAGNOSIS — E785 Hyperlipidemia, unspecified: Secondary | ICD-10-CM

## 2019-12-05 DIAGNOSIS — Z955 Presence of coronary angioplasty implant and graft: Secondary | ICD-10-CM

## 2019-12-05 DIAGNOSIS — I25118 Atherosclerotic heart disease of native coronary artery with other forms of angina pectoris: Secondary | ICD-10-CM | POA: Diagnosis not present

## 2019-12-05 DIAGNOSIS — R600 Localized edema: Secondary | ICD-10-CM

## 2019-12-05 DIAGNOSIS — I10 Essential (primary) hypertension: Secondary | ICD-10-CM

## 2019-12-05 NOTE — Progress Notes (Signed)
Virtual Visit via Telephone Note   This visit type was conducted due to national recommendations for restrictions regarding the COVID-19 Pandemic (e.g. social distancing) in an effort to limit this patient's exposure and mitigate transmission in our community.  Due to his co-morbid illnesses, this patient is at least at moderate risk for complications without adequate follow up.  This format is felt to be most appropriate for this patient at this time.  The patient did not have access to video technology/had technical difficulties with video requiring transitioning to audio format only (telephone).  All issues noted in this document were discussed and addressed.  No physical exam could be performed with this format.  Please refer to the patient's chart for his  consent to telehealth for [facility].   Date:  12/05/2019   ID:  Brandon Buck, DOB 1961/01/11, MRN [id number]  Patient Location: Home Provider Location: Home  PCP:  Brandon Frizzle, MD  Cardiologist:  No primary care provider on file.  Electrophysiologist:  None   Evaluation Performed:  New Patient Evaluation  Chief Complaint:  CAD  History of Present Illness:    Brandon Buck is a 59 y.o. male with a history of coronary artery disease and prior PCI to the circumflex and RCA as well as hypertension, CKD stage III, and COPD.  I have not evaluated him since September 2017.  He has chronic exertional dyspnea which is stable and has been so for several years. He quit smoking in 2009.  He seldom gets chest pains, alleviated with nitro. He also has GERD and wonders if symptoms are due to this.  He has been having swelling in his legs and feet, particularly his ankles and feet.  His nephrologist prescribed Lasix 2 weeks ago.   Past Medical History:  Diagnosis Date  . Cancer ([place])    Kidney  . Chronic back pain   . Chronic hip pain, left   . CKD (chronic kidney disease) stage 3, GFR 30-59 ml/min   . Gout   . Heart  disease   . High blood pressure   . History of PTCA 2    2 stents  . Hypertension   . Kidney disease   . Proteinuria   . Renal insufficiency   . Tennis elbow    Past Surgical History:  Procedure Laterality Date  . Heart catherization    . HERNIA REPAIR    . HIP SURGERY    . Kidney removed       Current Meds  Medication Sig  . allopurinol (ZYLOPRIM) 100 MG tablet Take 2 tablets (200 mg total) by mouth daily.  Marland Kitchen ALPRAZolam (XANAX) 0.5 MG tablet TAKE 1 TABLET BY MOUTH THREE TIMES DAILY AS NEEDED FOR ANXIETY.  Marland Kitchen aspirin 81 MG tablet Take 81 mg by mouth daily.  Marland Kitchen atorvastatin (LIPITOR) 80 MG tablet TAKE (1) TABLET BY MOUTH ONCE DAILY.  . cholecalciferol (VITAMIN D3) 25 MCG (1000 UNIT) tablet Take 2,000 Units by mouth daily.  . clopidogrel (PLAVIX) 75 MG tablet TAKE 1 TABLET BY MOUTH ONCE A DAY.  Marland Kitchen diltiazem (CARDIZEM CD) 240 MG 24 hr capsule TAKE (1) CAPSULE BY MOUTH DAILY.  . fluticasone (FLONASE) 50 MCG/ACT nasal spray Place 2 sprays into both nostrils daily.  . furosemide (LASIX) 20 MG tablet Take 20 mg by mouth 2 (two) times daily.  Marland Kitchen HYDROcodone-acetaminophen (NORCO/VICODIN) 5-325 MG tablet TAKE (1) TABLET BY MOUTH (3) TIMES DAILY AS NEEDED FOR MODERATE PAIN.  . metoprolol tartrate (  LOPRESSOR) 50 MG tablet TAKE ONE HALF TABLET BY MOUTH TWICE DAILY.  Marland Kitchen NICOTROL 10 MG inhaler INHALE 1 CARTRIDGE INTO THE LUNGS AS NEEDED FOR SMOKING CESSATION.  . nitroGLYCERIN (NITROSTAT) 0.4 MG SL tablet Place 0.4 mg under the tongue every 5 (five) minutes as needed.  . pantoprazole (PROTONIX) 40 MG tablet TAKE ONE TABLET BY MOUTH TWICE DAILY.  Marland Kitchen predniSONE (DELTASONE) 20 MG tablet 3 tabs poqday 1-2, 2 tabs poqday 3-4, 1 tab poqday 5-6  . [DISCONTINUED] colchicine 0.6 MG tablet Take 1 tablet (0.6 mg total) by mouth daily.  . [DISCONTINUED] doxazosin (CARDURA) 4 MG tablet Take 1 tablet (4 mg total) by mouth daily.  . [DISCONTINUED] lidocaine (LIDODERM) 5 % Place 1 patch onto the skin daily. Place one  patch on the skin daily. Remove & Discard patch within 12 hours of use.  . [DISCONTINUED] traMADol (ULTRAM) 50 MG tablet Take 1 tablet (50 mg total) by mouth every 6 (six) hours as needed.     Allergies:   Keflex [cephalexin]   Social History   Tobacco Use  . Smoking status: Former Smoker    Packs/day: 1.50    Years: 32.00    Pack years: 48.00    Types: Cigarettes    Start date: 01/26/1971    Quit date: 01/26/2003    Years since quitting: 16.8  . Smokeless tobacco: Never Used  Substance Use Topics  . Alcohol use: Yes    Alcohol/week: 0.0 standard drinks    Comment: occ  . Drug use: No     Family Hx: The patient's family history includes Cancer in an other family member; Heart disease in an other family member; Kidney disease in an other family member.  ROS:   Please see the history of present illness.     All other systems reviewed and are negative.   Prior CV studies:   The following studies were reviewed today:  Nuclear stress test 05/25/16:   Equivocal ST segment changes, mainly lead II, III, and aVF, less than 1 mm. Intermediate Duke treadmill score of 0.5.  Blood pressure demonstrated a hypertensive response to exercise.  Small, mild intensity, fixed mid to basal inferior defect that is more prominent at rest and consistent with soft tissue attenuation.  This is a low risk study based on perfusion imaging.  Nuclear stress EF: 71%.     Echo 08/12/14:  - Left ventricle: The cavity size was normal. Systolic function was  normal. The estimated ejection fraction was in the range of 60%  to 65%. Wall motion was normal; there were no regional wall  motion abnormalities.  - Pulmonary arteries: Systolic pressure was mildly increased. PA  peak pressure: 31 mm Hg (S).    Cardiac catheterization 01/17/08:   Left main was a large vessel with no disease.   The LAD is a large vessel courses to the apex with two diagonal  branches.  The LAD is calcified in its  mid segment with mild 40%  midvessel narrowing.  The first and second diagonals have no significant  disease.   Left circumflex is a medium-size vessel coursing in the AV groove and  two obtuse marginal branches.  The AV circumflex noted to have a 95%  midvessel lesion between the first and second OM.   First OM is a small vessel with no disease.   Second OM is a medium-size vessel which bifurcates in the segment with  sequential 30% lesion in its proximal midsegment.   The right  coronary artery is a large vessel that is dominant that gives  rise to a PDA as well as posterolateral branch.  A 50% proximal  narrowing as well as 30% midvessel narrowing.  In the distal RCA, the  sequential is 70% and 95% stenosis which is a hazy.  The PDA and  posterolateral branches have no disease.   No LV gram performed secondary to chronic renal insufficiency.   HEMODYNAMIC RESULTS:  Systemic arterial pressure 130/78, LV system  pressure 139/13, LVEDP of 24.   CONCLUSION:  1. Successful percutaneous transluminal angioplasty with placement of      a Cypher 2.75 x 18-mm stent in the mid left circumflex stenotic      lesion.  2. Successful percutaneous transluminal balloon angioplasty with      placement of a 2.75 x 23-mm Cypher of the distal RCA, ultimately      postdilated to 3 mm.  3. Adjuvant use of Angiomax infusion.    Labs/Other Tests and Data Reviewed:    EKG:  No ECG reviewed.  Recent Labs: No results found for requested labs within last 8760 hours.   Recent Lipid Panel Lab Results  Component Value Date/Time   CHOL 202 (H) 03/31/2017 12:00 PM   TRIG 103 03/31/2017 12:00 PM   HDL 46 03/31/2017 12:00 PM   CHOLHDL 4.4 03/31/2017 12:00 PM   LDLCALC 135 (H) 03/31/2017 12:00 PM    Wt Readings from Last 3 Encounters:  12/05/19 228 lb (103.4 kg)  12/05/18 210 lb (95.3 kg)  12/02/18 215 lb (97.5 kg)     Objective:    Vital Signs:  Ht 6' (1.829 m)   Wt 228 lb (103.4 kg)    BMI 30.92 kg/m    VITAL SIGNS:  reviewed  ASSESSMENT & PLAN:    1.  Coronary artery disease: History of PCI with DES to mild left circumflex and distal RCA on 01/17/2008.  Continue aspirin, beta-blocker and statin. I will stop clopidogrel. Symptomatically stable.  2.  Hypertension: No changes to therapy.  3.  Hyperlipidemia: Continue statin. Lipids followed by PCP.  4. Bilateral leg edema: Nephrology prescribed Lasix 20 mg bid.  5. CKD stage III: Creatinine 1.67 on 10/09/19.    COVID-19 Education: The signs and symptoms of COVID-19 were discussed with the patient and how to seek care for testing (follow up with PCP or arrange E-visit).  The importance of social distancing was discussed today.  Time:   Today, I have spent 20 minutes with the patient with telehealth technology discussing the above problems.     Medication Adjustments/Labs and Tests Ordered: Current medicines are reviewed at length with the patient today.  Concerns regarding medicines are outlined above.   Tests Ordered: No orders of the defined types were placed in this encounter.   Medication Changes: No orders of the defined types were placed in this encounter.   Follow Up:  In Person in 1 year(s)  Signed, Kate Sable, MD  12/05/2019 8:42 AM    [place]

## 2019-12-05 NOTE — Addendum Note (Signed)
Addended by: Debbora Lacrosse R on: 12/05/2019 09:33 AM   Modules accepted: Orders

## 2019-12-05 NOTE — Patient Instructions (Signed)
Medication Instructions:  STOP PLAVIX  Labwork: NONE  Testing/Procedures: NONE  Follow-Up: Your physician wants you to follow-up in: 1 YEAR.  You will receive a reminder letter in the mail two months in advance. If you don't receive a letter, please call our office to schedule the follow-up appointment.   Any Other Special Instructions Will Be Listed Below (If Applicable).     If you need a refill on your cardiac medications before your next appointment, please call your pharmacy.

## 2019-12-06 ENCOUNTER — Telehealth: Payer: Self-pay | Admitting: Licensed Clinical Social Worker

## 2019-12-06 NOTE — Telephone Encounter (Signed)
CSW referred to assist patient with obtaining a BP cuff. CSW contacted patient to inform cuff will be delivered to home. Patient grateful for support and assistance. CSW available as needed. Jackie Kaylub Detienne, LCSW, CCSW-MCS [phone]  

## 2019-12-20 ENCOUNTER — Other Ambulatory Visit: Payer: Self-pay | Admitting: Family Medicine

## 2019-12-20 DIAGNOSIS — F411 Generalized anxiety disorder: Secondary | ICD-10-CM

## 2019-12-20 NOTE — Telephone Encounter (Signed)
Last office visit: 03/15/2019 Last refill: 11/22/2019

## 2020-01-15 DIAGNOSIS — E559 Vitamin D deficiency, unspecified: Secondary | ICD-10-CM | POA: Diagnosis not present

## 2020-01-15 DIAGNOSIS — I129 Hypertensive chronic kidney disease with stage 1 through stage 4 chronic kidney disease, or unspecified chronic kidney disease: Secondary | ICD-10-CM | POA: Diagnosis not present

## 2020-01-15 DIAGNOSIS — R809 Proteinuria, unspecified: Secondary | ICD-10-CM | POA: Diagnosis not present

## 2020-01-15 DIAGNOSIS — N1831 Chronic kidney disease, stage 3a: Secondary | ICD-10-CM | POA: Diagnosis not present

## 2020-01-16 DIAGNOSIS — R809 Proteinuria, unspecified: Secondary | ICD-10-CM | POA: Diagnosis not present

## 2020-01-16 DIAGNOSIS — I129 Hypertensive chronic kidney disease with stage 1 through stage 4 chronic kidney disease, or unspecified chronic kidney disease: Secondary | ICD-10-CM | POA: Diagnosis not present

## 2020-01-16 DIAGNOSIS — N1831 Chronic kidney disease, stage 3a: Secondary | ICD-10-CM | POA: Diagnosis not present

## 2020-01-16 DIAGNOSIS — Z79899 Other long term (current) drug therapy: Secondary | ICD-10-CM | POA: Diagnosis not present

## 2020-01-16 DIAGNOSIS — E559 Vitamin D deficiency, unspecified: Secondary | ICD-10-CM | POA: Diagnosis not present

## 2020-01-21 ENCOUNTER — Other Ambulatory Visit: Payer: Self-pay | Admitting: Family Medicine

## 2020-01-21 DIAGNOSIS — F411 Generalized anxiety disorder: Secondary | ICD-10-CM

## 2020-01-21 NOTE — Telephone Encounter (Signed)
Ok to refill??  Last office visit 03/15/2019.  Last refill on Xanax 12/20/2019.  Last refill on Hydrocodone/APAP 12/20/2019.

## 2020-01-28 DIAGNOSIS — Z79899 Other long term (current) drug therapy: Secondary | ICD-10-CM | POA: Diagnosis not present

## 2020-01-28 DIAGNOSIS — N1831 Chronic kidney disease, stage 3a: Secondary | ICD-10-CM | POA: Diagnosis not present

## 2020-01-28 DIAGNOSIS — E559 Vitamin D deficiency, unspecified: Secondary | ICD-10-CM | POA: Diagnosis not present

## 2020-01-28 DIAGNOSIS — I129 Hypertensive chronic kidney disease with stage 1 through stage 4 chronic kidney disease, or unspecified chronic kidney disease: Secondary | ICD-10-CM | POA: Diagnosis not present

## 2020-01-28 DIAGNOSIS — R809 Proteinuria, unspecified: Secondary | ICD-10-CM | POA: Diagnosis not present

## 2020-01-31 ENCOUNTER — Other Ambulatory Visit: Payer: Self-pay

## 2020-01-31 ENCOUNTER — Ambulatory Visit (INDEPENDENT_AMBULATORY_CARE_PROVIDER_SITE_OTHER): Payer: PPO | Admitting: Family Medicine

## 2020-01-31 ENCOUNTER — Telehealth: Payer: Self-pay | Admitting: *Deleted

## 2020-01-31 VITALS — BP 142/84 | HR 96 | Temp 97.4°F | Resp 16 | Ht 72.0 in | Wt 221.0 lb

## 2020-01-31 DIAGNOSIS — I10 Essential (primary) hypertension: Secondary | ICD-10-CM

## 2020-01-31 DIAGNOSIS — N183 Chronic kidney disease, stage 3 unspecified: Secondary | ICD-10-CM

## 2020-01-31 DIAGNOSIS — F411 Generalized anxiety disorder: Secondary | ICD-10-CM | POA: Diagnosis not present

## 2020-01-31 DIAGNOSIS — Z125 Encounter for screening for malignant neoplasm of prostate: Secondary | ICD-10-CM

## 2020-01-31 DIAGNOSIS — E7800 Pure hypercholesterolemia, unspecified: Secondary | ICD-10-CM | POA: Diagnosis not present

## 2020-01-31 DIAGNOSIS — Z9861 Coronary angioplasty status: Secondary | ICD-10-CM

## 2020-01-31 LAB — CBC WITH DIFFERENTIAL/PLATELET
Absolute Monocytes: 530 cells/uL (ref 200–950)
Basophils Absolute: 48 cells/uL (ref 0–200)
Basophils Relative: 0.7 %
Eosinophils Absolute: 109 cells/uL (ref 15–500)
Eosinophils Relative: 1.6 %
HCT: 48.4 % (ref 38.5–50.0)
Hemoglobin: 16.2 g/dL (ref 13.2–17.1)
Lymphs Abs: 1591 cells/uL (ref 850–3900)
MCH: 30.7 pg (ref 27.0–33.0)
MCHC: 33.5 g/dL (ref 32.0–36.0)
MCV: 91.8 fL (ref 80.0–100.0)
MPV: 10.3 fL (ref 7.5–12.5)
Monocytes Relative: 7.8 %
Neutro Abs: 4522 cells/uL (ref 1500–7800)
Neutrophils Relative %: 66.5 %
Platelets: 243 10*3/uL (ref 140–400)
RBC: 5.27 10*6/uL (ref 4.20–5.80)
RDW: 13.4 % (ref 11.0–15.0)
Total Lymphocyte: 23.4 %
WBC: 6.8 10*3/uL (ref 3.8–10.8)

## 2020-01-31 LAB — COMPLETE METABOLIC PANEL WITH GFR
AG Ratio: 2 (calc) (ref 1.0–2.5)
ALT: 47 U/L — ABNORMAL HIGH (ref 9–46)
AST: 37 U/L — ABNORMAL HIGH (ref 10–35)
Albumin: 4.9 g/dL (ref 3.6–5.1)
Alkaline phosphatase (APISO): 81 U/L (ref 35–144)
BUN/Creatinine Ratio: 10 (calc) (ref 6–22)
BUN: 21 mg/dL (ref 7–25)
CO2: 28 mmol/L (ref 20–32)
Calcium: 10.3 mg/dL (ref 8.6–10.3)
Chloride: 99 mmol/L (ref 98–110)
Creat: 2.12 mg/dL — ABNORMAL HIGH (ref 0.70–1.25)
GFR, Est African American: 38 mL/min/{1.73_m2} — ABNORMAL LOW (ref >=60)
GFR, Est Non African American: 33 mL/min/{1.73_m2} — ABNORMAL LOW (ref >=60)
Globulin: 2.5 g/dL (calc) (ref 1.9–3.7)
Glucose, Bld: 101 mg/dL — ABNORMAL HIGH (ref 65–99)
Potassium: 4.7 mmol/L (ref 3.5–5.3)
Sodium: 137 mmol/L (ref 135–146)
Total Bilirubin: 0.6 mg/dL (ref 0.2–1.2)
Total Protein: 7.4 g/dL (ref 6.1–8.1)

## 2020-01-31 LAB — LIPID PANEL
Cholesterol: 250 mg/dL — ABNORMAL HIGH (ref <200)
HDL: 41 mg/dL (ref >=40)
LDL Cholesterol (Calc): 168 mg/dL (calc) — ABNORMAL HIGH
Non-HDL Cholesterol (Calc): 209 mg/dL (calc) — ABNORMAL HIGH (ref <130)
Total CHOL/HDL Ratio: 6.1 (calc) — ABNORMAL HIGH (ref <5.0)
Triglycerides: 242 mg/dL — ABNORMAL HIGH (ref <150)

## 2020-01-31 LAB — PSA: PSA: 5.3 ng/mL — ABNORMAL HIGH (ref <=4.0)

## 2020-01-31 MED ORDER — ALPRAZOLAM 0.5 MG PO TABS: 0.5000 mg | ORAL_TABLET | Freq: Three times a day (TID) | ORAL | 0 refills | Status: DC | PRN

## 2020-01-31 MED ORDER — HYDROCODONE-ACETAMINOPHEN 5-325 MG PO TABS: 1.0000 | ORAL_TABLET | Freq: Four times a day (QID) | ORAL | 0 refills | Status: DC | PRN

## 2020-01-31 NOTE — Progress Notes (Signed)
Subjective:    Patient ID: Brandon Buck, male    DOB: 1961-09-25, 59 y.o.   MRN: [id number]  Medication Refill  Patient has not been seen in almost a year.  Patient has a history of CAD with catheterization in 2009 requiring stent in the L circumflex and RCA.  Also has a histoyr of stage III CKD with gout.   Patient states that his nephrologist recently resume lisinopril 10 mg a day due to proteinuria.  They also increase his Lasix to 20 mg twice a day due to hyperkalemia.  I would like to recheck his potassium as well as his renal function.  He states since he has been taking allopurinol he has not had a gout flare in over a year.  He still has chronic dyspnea on exertion however he denies any orthopnea or paroxysmal nocturnal dyspnea.  He denies any angina.  He denies any leg swelling.  He is on chronic Xanax and hydrocodone.  He has chronic hip pain due to avascular necrosis of the left hip.  Given his chronic kidney disease he is unable to take NSAIDs and therefore we will give the patient opiates for this.  He also uses Xanax sparingly for anxiety.  There is no evidence of abuse or diversion.  Patient is also overdue for prostate cancer screening as well as colon cancer screening.  He refuses a colonoscopy but he will consent to Cologuard.  He receives his second Covid vaccination later this month. Past Medical History:  Diagnosis Date  . Cancer ([place])    Kidney  . Chronic back pain   . Chronic hip pain, left   . CKD (chronic kidney disease) stage 3, GFR 30-59 ml/min   . Gout   . Heart disease   . High blood pressure   . History of PTCA 2    2 stents  . Hypertension   . Kidney disease   . Proteinuria   . Renal insufficiency   . Tennis elbow    Past Surgical History:  Procedure Laterality Date  . Heart catherization    . HERNIA REPAIR    . HIP SURGERY    . Kidney removed     Current Outpatient Medications on File Prior to Visit  Medication Sig Dispense Refill  . allopurinol  (ZYLOPRIM) 100 MG tablet Take 2 tablets (200 mg total) by mouth daily. 60 tablet 11  . ALPRAZolam (XANAX) 0.5 MG tablet TAKE 1 TABLET BY MOUTH THREE TIMES DAILY AS NEEDED FOR ANXIETY. 30 tablet 0  . aspirin 81 MG tablet Take 81 mg by mouth daily.    Marland Kitchen atorvastatin (LIPITOR) 80 MG tablet TAKE (1) TABLET BY MOUTH ONCE DAILY. 90 tablet 0  . cholecalciferol (VITAMIN D3) 25 MCG (1000 UNIT) tablet Take 2,000 Units by mouth daily.    Marland Kitchen diltiazem (CARDIZEM CD) 240 MG 24 hr capsule TAKE (1) CAPSULE BY MOUTH DAILY. 90 capsule 2  . fluticasone (FLONASE) 50 MCG/ACT nasal spray Place 2 sprays into both nostrils daily. 16 g 6  . furosemide (LASIX) 20 MG tablet Take 20 mg by mouth 2 (two) times daily.    Marland Kitchen HYDROcodone-acetaminophen (NORCO/VICODIN) 5-325 MG tablet TAKE (1) TABLET BY MOUTH (3) TIMES DAILY AS NEEDED FOR MODERATE PAIN. 90 tablet 0  . lisinopril (ZESTRIL) 20 MG tablet Take 10 mg by mouth daily.    . metoprolol tartrate (LOPRESSOR) 50 MG tablet TAKE ONE HALF TABLET BY MOUTH TWICE DAILY. 90 tablet 3  . NICOTROL 10 MG  inhaler INHALE 1 CARTRIDGE INTO THE LUNGS AS NEEDED FOR SMOKING CESSATION. 168 each 0  . nitroGLYCERIN (NITROSTAT) 0.4 MG SL tablet Place 0.4 mg under the tongue every 5 (five) minutes as needed.    . pantoprazole (PROTONIX) 40 MG tablet TAKE ONE TABLET BY MOUTH TWICE DAILY. 60 tablet 5   No current facility-administered medications on file prior to visit.    Allergies  Allergen Reactions  . Keflex [Cephalexin] Rash   Social History   Socioeconomic History  . Marital status: Married    Spouse name: Not on file  . Number of children: Not on file  . Years of education: Not on file  . Highest education level: Not on file  Occupational History  . Not on file  Tobacco Use  . Smoking status: Former Smoker    Packs/day: 1.50    Years: 32.00    Pack years: 48.00    Types: Cigarettes    Start date: 01/26/1971    Quit date: 01/26/2003    Years since quitting: 17.0  . Smokeless  tobacco: Never Used  Substance and Sexual Activity  . Alcohol use: Yes    Alcohol/week: 0.0 standard drinks    Comment: occ  . Drug use: No  . Sexual activity: Not on file  Other Topics Concern  . Not on file  Social History Narrative  . Not on file   Social Determinants of Health   Financial Resource Strain:   . Difficulty of Paying Living Expenses:   Food Insecurity:   . Worried About Charity fundraiser in the Last Year:   . Arboriculturist in the Last Year:   Transportation Needs:   . Film/video editor (Medical):   Marland Kitchen Lack of Transportation (Non-Medical):   Physical Activity:   . Days of Exercise per Week:   . Minutes of Exercise per Session:   Stress:   . Feeling of Stress :   Social Connections:   . Frequency of Communication with Friends and Family:   . Frequency of Social Gatherings with Friends and Family:   . Attends Religious Services:   . Active Member of Clubs or Organizations:   . Attends Archivist Meetings:   Marland Kitchen Marital Status:   Intimate Partner Violence:   . Fear of Current or Ex-Partner:   . Emotionally Abused:   Marland Kitchen Physically Abused:   . Sexually Abused:       Review of Systems  All other systems reviewed and are negative.      Objective:   Physical Exam  Constitutional: He appears well-developed and well-nourished. No distress.  HENT:  Right Ear: External ear normal.  Left Ear: External ear normal.  Nose: Nose normal.  Mouth/Throat: Oropharynx is clear and moist. No oropharyngeal exudate.  Eyes: Conjunctivae are normal. No scleral icterus.  Cardiovascular: Normal rate, regular rhythm and normal heart sounds.  Pulmonary/Chest: Effort normal and breath sounds normal. No respiratory distress. He has no wheezes. He has no rales.  Abdominal: Soft. Bowel sounds are normal. He exhibits no distension. There is no abdominal tenderness. There is no rebound. Hernia confirmed negative in the right inguinal area and confirmed negative in  the left inguinal area.  Genitourinary:    Testes and penis normal.  Right testis shows no mass and no tenderness. Left testis shows no mass and no tenderness.  Musculoskeletal:     Cervical back: Neck supple.  Lymphadenopathy:    He has no cervical adenopathy.  Skin:  He is not diaphoretic.  Vitals reviewed.         Assessment & Plan:  Benign essential HTN  History of PTCA 2  Stage 3 chronic kidney disease, unspecified whether stage 3a or 3b CKD  Pure hypercholesterolemia  Prostate cancer screening  I will screen the patient for colon cancer with a Cologuard.  I will screen for prostate cancer with a PSA.  Given his history of coronary artery disease I will check a fasting lipid panel.  His goal LDL cholesterol is less than 70.  He is on an aspirin.  He is on a beta-blocker.  He is on an ACE inhibitor.  He is on Lipitor.  He denies any symptoms of angina.  I will monitor his potassium and renal function.  Continuing allopurinol for gout prevention.  Regular anticipatory guidance is provided.

## 2020-01-31 NOTE — Telephone Encounter (Signed)
-----   Message from Alyson Locket, [place] sent at 01/31/2020  9:14 AM EDT -----  ----- Message ----- From: [name], MD Sent: 01/31/2020   8:13 AM EDT To: Alyson Locket, RMA  Please schedule cologuard

## 2020-01-31 NOTE — Telephone Encounter (Signed)
Received verbal orders for Cologuard.   Order placed via [facility].   Cologuard (Order [id number])

## 2020-02-01 ENCOUNTER — Telehealth: Payer: Self-pay

## 2020-02-01 DIAGNOSIS — R972 Elevated prostate specific antigen [PSA]: Secondary | ICD-10-CM

## 2020-02-01 DIAGNOSIS — Z125 Encounter for screening for malignant neoplasm of prostate: Secondary | ICD-10-CM

## 2020-02-01 MED ORDER — EZETIMIBE 10 MG PO TABS: 10.0000 mg | ORAL_TABLET | Freq: Every day | ORAL | 2 refills | Status: DC

## 2020-02-01 NOTE — Telephone Encounter (Signed)
Error

## 2020-02-11 ENCOUNTER — Encounter: Payer: Self-pay | Admitting: Family Medicine

## 2020-02-11 DIAGNOSIS — Z1211 Encounter for screening for malignant neoplasm of colon: Secondary | ICD-10-CM | POA: Diagnosis not present

## 2020-02-11 DIAGNOSIS — Z1212 Encounter for screening for malignant neoplasm of rectum: Secondary | ICD-10-CM | POA: Diagnosis not present

## 2020-02-11 LAB — COLOGUARD

## 2020-02-12 DIAGNOSIS — N184 Chronic kidney disease, stage 4 (severe): Secondary | ICD-10-CM | POA: Diagnosis not present

## 2020-02-12 DIAGNOSIS — E875 Hyperkalemia: Secondary | ICD-10-CM | POA: Diagnosis not present

## 2020-02-15 NOTE — Telephone Encounter (Signed)
Received the results of Cologuard screening.   Screening noted negative.   A negative result indicates a low likelihood of colorectal cancer is present. Following a negative Cologuard result, the American Cancer Society recommends a Cologuard re-screening interval of 3 years.   Letter sent.   

## 2020-02-25 ENCOUNTER — Telehealth: Payer: Self-pay | Admitting: Family Medicine

## 2020-02-25 ENCOUNTER — Other Ambulatory Visit: Payer: Self-pay | Admitting: Family Medicine

## 2020-02-25 DIAGNOSIS — F411 Generalized anxiety disorder: Secondary | ICD-10-CM

## 2020-02-25 NOTE — Telephone Encounter (Signed)
Requesting refill    Xanax & Hydrocodone  LOV: 4/8/20201  LRF:  01/31/2020

## 2020-02-25 NOTE — Telephone Encounter (Signed)
Patient calling to get the results of his colorgard test that was done [phone]

## 2020-02-26 ENCOUNTER — Other Ambulatory Visit: Payer: Self-pay | Admitting: Family Medicine

## 2020-02-26 DIAGNOSIS — F411 Generalized anxiety disorder: Secondary | ICD-10-CM

## 2020-02-26 MED ORDER — HYDROCODONE-ACETAMINOPHEN 5-325 MG PO TABS: ORAL_TABLET | ORAL | 0 refills | Status: DC

## 2020-02-26 MED ORDER — ALPRAZOLAM 0.5 MG PO TABS: 0.5000 mg | ORAL_TABLET | Freq: Three times a day (TID) | ORAL | 0 refills | Status: DC | PRN

## 2020-02-26 NOTE — Addendum Note (Signed)
Addended by: Jenna Luo T on: 02/26/2020 02:00 PM   Modules accepted: Orders

## 2020-02-26 NOTE — Addendum Note (Signed)
Addended by: Shary Decamp B on: 02/26/2020 07:57 AM   Modules accepted: Orders

## 2020-02-27 NOTE — Telephone Encounter (Signed)
Pt aware of negative results via vm

## 2020-03-14 ENCOUNTER — Other Ambulatory Visit: Payer: Self-pay

## 2020-03-14 ENCOUNTER — Emergency Department (HOSPITAL_COMMUNITY)
Admission: EM | Admit: 2020-03-14 | Discharge: 2020-03-14 | Disposition: A | Discharge status: Home / Self Care | Payer: PPO | Attending: Emergency Medicine | Admitting: Emergency Medicine

## 2020-03-14 ENCOUNTER — Encounter (HOSPITAL_COMMUNITY): Payer: Self-pay | Admitting: Emergency Medicine

## 2020-03-14 DIAGNOSIS — I129 Hypertensive chronic kidney disease with stage 1 through stage 4 chronic kidney disease, or unspecified chronic kidney disease: Secondary | ICD-10-CM | POA: Insufficient documentation

## 2020-03-14 DIAGNOSIS — I25118 Atherosclerotic heart disease of native coronary artery with other forms of angina pectoris: Secondary | ICD-10-CM | POA: Diagnosis not present

## 2020-03-14 DIAGNOSIS — Z79899 Other long term (current) drug therapy: Secondary | ICD-10-CM | POA: Insufficient documentation

## 2020-03-14 DIAGNOSIS — I160 Hypertensive urgency: Secondary | ICD-10-CM

## 2020-03-14 DIAGNOSIS — N1832 Chronic kidney disease, stage 3b: Secondary | ICD-10-CM | POA: Diagnosis not present

## 2020-03-14 DIAGNOSIS — N183 Chronic kidney disease, stage 3 unspecified: Secondary | ICD-10-CM | POA: Insufficient documentation

## 2020-03-14 DIAGNOSIS — Z7982 Long term (current) use of aspirin: Secondary | ICD-10-CM | POA: Insufficient documentation

## 2020-03-14 DIAGNOSIS — Z87891 Personal history of nicotine dependence: Secondary | ICD-10-CM | POA: Diagnosis not present

## 2020-03-14 DIAGNOSIS — R809 Proteinuria, unspecified: Secondary | ICD-10-CM | POA: Diagnosis not present

## 2020-03-14 LAB — CBC
HCT: 42.8 % (ref 39.0–52.0)
Hemoglobin: 14.3 g/dL (ref 13.0–17.0)
MCH: 30.6 pg (ref 26.0–34.0)
MCHC: 33.4 g/dL (ref 30.0–36.0)
MCV: 91.6 fL (ref 80.0–100.0)
Platelets: 212 10*3/uL (ref 150–400)
RBC: 4.67 MIL/uL (ref 4.22–5.81)
RDW: 13.7 % (ref 11.5–15.5)
WBC: 6.4 10*3/uL (ref 4.0–10.5)
nRBC: 0 % (ref 0.0–0.2)

## 2020-03-14 LAB — URINALYSIS, ROUTINE W REFLEX MICROSCOPIC
Bacteria, UA: NONE SEEN
Bilirubin Urine: NEGATIVE
Glucose, UA: NEGATIVE mg/dL
Ketones, ur: NEGATIVE mg/dL
Leukocytes,Ua: NEGATIVE
Nitrite: NEGATIVE
Protein, ur: NEGATIVE mg/dL
Specific Gravity, Urine: 1.01 (ref 1.005–1.030)
pH: 6 (ref 5.0–8.0)

## 2020-03-14 LAB — BASIC METABOLIC PANEL
Anion gap: 14 (ref 5–15)
BUN: 27 mg/dL — ABNORMAL HIGH (ref 6–20)
CO2: 23 mmol/L (ref 22–32)
Calcium: 9.1 mg/dL (ref 8.9–10.3)
Chloride: 100 mmol/L (ref 98–111)
Creatinine, Ser: 1.9 mg/dL — ABNORMAL HIGH (ref 0.61–1.24)
GFR calc Af Amer: 43 mL/min — ABNORMAL LOW (ref >60)
GFR calc non Af Amer: 37 mL/min — ABNORMAL LOW (ref >60)
Glucose, Bld: 104 mg/dL — ABNORMAL HIGH (ref 70–99)
Potassium: 4.4 mmol/L (ref 3.5–5.1)
Sodium: 137 mmol/L (ref 135–145)

## 2020-03-14 MED ORDER — FUROSEMIDE 20 MG PO TABS: 20.0000 mg | ORAL_TABLET | Freq: Every day | ORAL | 0 refills | Status: DC

## 2020-03-14 NOTE — ED Triage Notes (Signed)
Pt reports elevated blood pressure, chills, nausea x2 weeks. Pt reports was sent over from pcp office. Pt reports was taken off plavix 2-3 months ago. Pt denies chest pain.

## 2020-03-14 NOTE — Discharge Instructions (Signed)
As discussed, today's evaluation has been generally reassuring.  Her blood pressure is slightly elevated, but your labs do not demonstrate substantial changes to your organs, including your kidneys, heart. However, with your history of kidney disease, it is important that you monitor your condition carefully.  For the next week please take 20 mg of Lasix daily rather than twice daily.  Please discuss this with your physician when you have follow-up next week.  Otherwise, please take all the medication as directed, and return here for concerning changes in your condition.

## 2020-03-14 NOTE — ED Provider Notes (Signed)
[facility] EMERGENCY DEPARTMENT Provider Note   CSN: [id number] Arrival date & time: 03/14/20  0109     History Chief Complaint  Patient presents with  . Hypertension    Brandon Buck is a 59 y.o. male.  HPI    Patient presents with concern of hypertensive urgency, diaphoresis. Actually discussed the patient with his nephrologist prior to being sent here for evaluation. Patient has multiple medical issues including CKD, has 1 kidney.  He also has hypertension, and has been attempting to control this with multiple medications. He notes that for the past few days he has had episodes of diaphoresis, lightheadedness, but no chest pain, no dyspnea. No fever, no confusion, no disorientation.  The patient's nephrologist notes that on arrival to that facility today, for an appointment he was found to be diaphoretic, hypertensive, and with concern for decompensated state he was sent here for evaluation.  Past Medical History:  Diagnosis Date  . Cancer ([place])    Kidney  . Chronic back pain   . Chronic hip pain, left   . CKD (chronic kidney disease) stage 3, GFR 30-59 ml/min   . Gout   . Heart disease   . High blood pressure   . History of PTCA 2    2 stents  . Hypertension   . Kidney disease   . Proteinuria   . Renal insufficiency   . Tennis elbow     Patient Active Problem List   Diagnosis Date Noted  . CKD (chronic kidney disease) stage 3, GFR 30-59 ml/min   . Proteinuria   . History of PTCA 2   . Gout   . Plantar fasciitis 03/01/2012  . HIP PAIN 11/11/2008  . ASEPTIC NECROSIS 11/11/2008    Past Surgical History:  Procedure Laterality Date  . Heart catherization    . HERNIA REPAIR    . HIP SURGERY    . Kidney removed         Family History  Problem Relation Age of Onset  . Heart disease Other   . Cancer Other   . Kidney disease Other     Social History   Tobacco Use  . Smoking status: Former Smoker    Packs/day: 1.50    Years: 32.00    Pack  years: 48.00    Types: Cigarettes    Start date: 01/26/1971    Quit date: 01/26/2003    Years since quitting: 17.1  . Smokeless tobacco: Never Used  Substance Use Topics  . Alcohol use: Yes    Alcohol/week: 0.0 standard drinks    Comment: occ  . Drug use: No    Home Medications Prior to Admission medications   Medication Sig Start Date End Date Taking? Authorizing Provider  allopurinol (ZYLOPRIM) 100 MG tablet Take 2 tablets (200 mg total) by mouth daily. 03/15/19   [name], MD  ALPRAZolam Duanne Moron) 0.5 MG tablet Take 1 tablet (0.5 mg total) by mouth 3 (three) times daily as needed. for anxiety 02/26/20   [name], MD  aspirin 81 MG tablet Take 81 mg by mouth daily.    [provider]  atorvastatin (LIPITOR) 80 MG tablet TAKE (1) TABLET BY MOUTH ONCE DAILY. 11/07/18   [name], MD  cholecalciferol (VITAMIN D3) 25 MCG (1000 UNIT) tablet Take 2,000 Units by mouth daily.    [provider]  diltiazem (CARDIZEM CD) 240 MG 24 hr capsule TAKE (1) CAPSULE BY MOUTH DAILY. 02/09/19   Jenna Luo  T, MD  ezetimibe (ZETIA) 10 MG tablet Take 1 tablet (10 mg total) by mouth daily. 02/01/20   [name], MD  fluticasone (FLONASE) 50 MCG/ACT nasal spray Place 2 sprays into both nostrils daily. 01/29/19   [name], MD  furosemide (LASIX) 20 MG tablet Take 1 tablet (20 mg total) by mouth daily. 03/14/20   Carmin Muskrat, MD  HYDROcodone-acetaminophen (NORCO/VICODIN) 5-325 MG tablet TAKE 1 TABLET BY MOUTH EVERY SIX HOURS AS NEEDED FOR MODERATE PAIN. 02/26/20   [name], MD  lisinopril (ZESTRIL) 20 MG tablet Take 10 mg by mouth daily.    [provider]  metoprolol tartrate (LOPRESSOR) 50 MG tablet TAKE ONE HALF TABLET BY MOUTH TWICE DAILY. 12/08/18   [name], MD  NICOTROL 10 MG inhaler INHALE 1 CARTRIDGE INTO THE LUNGS AS NEEDED FOR SMOKING CESSATION. 07/13/19   [name], MD  nitroGLYCERIN (NITROSTAT) 0.4 MG SL tablet  Place 0.4 mg under the tongue every 5 (five) minutes as needed.    [provider]  pantoprazole (PROTONIX) 40 MG tablet TAKE ONE TABLET BY MOUTH TWICE DAILY. 11/05/19   [name], MD    Allergies    Keflex [cephalexin]  Review of Systems   Review of Systems  Constitutional:       Per HPI, otherwise negative  HENT:       Per HPI, otherwise negative  Respiratory:       Per HPI, otherwise negative  Cardiovascular:       Per HPI, otherwise negative  Gastrointestinal: Negative for vomiting.  Endocrine:       Negative aside from HPI  Genitourinary:       Neg aside from HPI   Musculoskeletal:       Per HPI, otherwise negative  Skin: Negative.   Neurological: Negative for syncope.    Physical Exam Updated Vital Signs BP 135/71   Pulse 65   Temp 98.1 F (36.7 C) (Oral)   Resp 18   Ht 6' (1.829 m)   Wt 99.8 kg   SpO2 96%   BMI 29.84 kg/m   Physical Exam Vitals and nursing note reviewed.  Constitutional:      General: He is not in acute distress.    Appearance: He is well-developed.  HENT:     Head: Normocephalic and atraumatic.  Eyes:     Conjunctiva/sclera: Conjunctivae normal.  Cardiovascular:     Rate and Rhythm: Normal rate and regular rhythm.  Pulmonary:     Effort: Pulmonary effort is normal. No respiratory distress.     Breath sounds: No stridor.  Abdominal:     General: There is no distension.  Skin:    General: Skin is warm and dry.  Neurological:     Mental Status: He is alert and oriented to person, place, and time.     ED Results / Procedures / Treatments   Labs (all labs ordered are listed, but only abnormal results are displayed) Labs Reviewed  BASIC METABOLIC PANEL - Abnormal; Notable for the following components:      Result Value   Glucose, Bld 104 (*)    BUN 27 (*)    Creatinine, Ser 1.90 (*)    GFR calc non Af Amer 37 (*)    GFR calc Af Amer 43 (*)    All other components within normal limits  URINALYSIS, ROUTINE W  REFLEX MICROSCOPIC - Abnormal; Notable for the following components:   Color, Urine STRAW (*)  Hgb urine dipstick SMALL (*)    All other components within normal limits  CBC    EKG EKG Interpretation  Date/Time:  Friday Mar 14 2020 10:14:00 EDT Ventricular Rate:  68 PR Interval:  134 QRS Duration: 86 QT Interval:  386 QTC Calculation: 410 R Axis:   49 Text Interpretation: Normal sinus rhythm Nonspecific ST and T wave abnormality Abnormal ECG Confirmed by Carmin Muskrat [phone]) on 03/14/2020 11:02:36 AM   Radiology No results found.  Procedures Procedures (including critical care time)  Medications Ordered in ED Medications - No data to display  ED Course  I have reviewed the triage vital signs and the nursing notes.  Pertinent labs & imaging results that were available during my care of the patient were reviewed by me and considered in my medical decision making (see chart for details).   On arrival the patient's blood pressure is 162/81.  He is awake, alert, sitting upright, speaking clearly. With consideration of hypertensive crisis, consideration of endorgan damage, labs, EKG ordered.  12:11 PM Patient awake, alert, in no distress.  Blood pressure substantially improved.  Some suspicion for the passage of time after taking his morning meds contributing to that.  He continues to deny other complaints, states that he feels good.  We discussed findings today, and absent any chest pain throughout, with a nonischemic EKG, no current complaints, normalized blood pressure, patient will be discharged with close outpatient follow-up. He is amenable to, appropriate for that plan. With consideration of some mild evidence for dehydration, patient's Lasix will be reduced for the coming days, pending outpatient follow-up. Final Clinical Impression(s) / ED Diagnoses Final diagnoses:  Hypertensive urgency    Rx / DC Orders ED Discharge Orders         Ordered    furosemide  (LASIX) 20 MG tablet  Daily     03/14/20 1207           Carmin Muskrat, MD 03/14/20 1212

## 2020-03-17 ENCOUNTER — Ambulatory Visit: Payer: PPO | Admitting: Urology

## 2020-03-25 ENCOUNTER — Other Ambulatory Visit: Payer: Self-pay | Admitting: Family Medicine

## 2020-03-25 NOTE — Telephone Encounter (Signed)
Ok to refill??  Last office visit 01/31/2020.  Last refill 02/26/2020.

## 2020-04-02 DIAGNOSIS — I25118 Atherosclerotic heart disease of native coronary artery with other forms of angina pectoris: Secondary | ICD-10-CM | POA: Diagnosis not present

## 2020-04-02 DIAGNOSIS — N1832 Chronic kidney disease, stage 3b: Secondary | ICD-10-CM | POA: Diagnosis not present

## 2020-04-02 DIAGNOSIS — R809 Proteinuria, unspecified: Secondary | ICD-10-CM | POA: Diagnosis not present

## 2020-04-02 DIAGNOSIS — I129 Hypertensive chronic kidney disease with stage 1 through stage 4 chronic kidney disease, or unspecified chronic kidney disease: Secondary | ICD-10-CM | POA: Diagnosis not present

## 2020-04-07 ENCOUNTER — Other Ambulatory Visit: Payer: Self-pay | Admitting: Family Medicine

## 2020-04-07 DIAGNOSIS — M1000 Idiopathic gout, unspecified site: Secondary | ICD-10-CM

## 2020-04-09 DIAGNOSIS — N1832 Chronic kidney disease, stage 3b: Secondary | ICD-10-CM | POA: Diagnosis not present

## 2020-04-09 DIAGNOSIS — E559 Vitamin D deficiency, unspecified: Secondary | ICD-10-CM | POA: Diagnosis not present

## 2020-04-09 DIAGNOSIS — I129 Hypertensive chronic kidney disease with stage 1 through stage 4 chronic kidney disease, or unspecified chronic kidney disease: Secondary | ICD-10-CM | POA: Diagnosis not present

## 2020-04-09 DIAGNOSIS — R809 Proteinuria, unspecified: Secondary | ICD-10-CM | POA: Diagnosis not present

## 2020-04-11 ENCOUNTER — Other Ambulatory Visit: Payer: Self-pay | Admitting: Family Medicine

## 2020-04-21 ENCOUNTER — Other Ambulatory Visit: Payer: Self-pay | Admitting: Family Medicine

## 2020-04-21 DIAGNOSIS — F411 Generalized anxiety disorder: Secondary | ICD-10-CM

## 2020-04-21 NOTE — Telephone Encounter (Signed)
Alprazolam Last refill 02/26/20 Hydrocodone Last refill 03/25/20

## 2020-04-22 ENCOUNTER — Encounter: Payer: Self-pay | Admitting: Urology

## 2020-04-22 ENCOUNTER — Ambulatory Visit (INDEPENDENT_AMBULATORY_CARE_PROVIDER_SITE_OTHER): Payer: PPO | Admitting: Urology

## 2020-04-22 ENCOUNTER — Other Ambulatory Visit: Payer: Self-pay | Admitting: Urology

## 2020-04-22 ENCOUNTER — Other Ambulatory Visit: Payer: Self-pay

## 2020-04-22 VITALS — BP 146/80 | HR 62 | Temp 98.2°F | Ht 72.0 in | Wt 221.0 lb

## 2020-04-22 DIAGNOSIS — R300 Dysuria: Secondary | ICD-10-CM | POA: Diagnosis not present

## 2020-04-22 DIAGNOSIS — R972 Elevated prostate specific antigen [PSA]: Secondary | ICD-10-CM

## 2020-04-22 LAB — POCT URINALYSIS DIPSTICK
Bilirubin, UA: NEGATIVE
Glucose, UA: NEGATIVE
Ketones, UA: NEGATIVE
Leukocytes, UA: NEGATIVE
Nitrite, UA: NEGATIVE
Protein, UA: NEGATIVE
Spec Grav, UA: 1.015 (ref 1.010–1.025)
Urobilinogen, UA: NEGATIVE E.U./dL — AB
pH, UA: 5 (ref 5.0–8.0)

## 2020-04-22 MED ORDER — LEVOFLOXACIN 750 MG PO TABS: 750.0000 mg | ORAL_TABLET | Freq: Every day | ORAL | 0 refills | Status: AC

## 2020-04-22 NOTE — Consult Note (Signed)
H&P  Chief Complaint: Elevated PSA  History of Present Illness: 59 year old male sent by Dr. Dennard Schaumann for evaluation of elevated PSA.  PSA was 5.3 on 4.8.2021.  In 2017, PSA was 3.10.  He does have a history of renal cell carcinoma and is status post remote right radical nephrectomy.  There is a strong family history of prostate cancer, in his father and grandfather.  Apparently they both died of the illness.     Past Medical History:  Diagnosis Date   Anxiety    Arthritis    Cancer ([place])    Kidney   Chronic back pain    Chronic hip pain, left    CKD (chronic kidney disease) stage 3, GFR 30-59 ml/min    Depression    GERD (gastroesophageal reflux disease)    Gout    Heart disease    High blood pressure    History of PTCA 2    2 stents   History of stomach ulcers    Hypercholesteremia    Hypertension    Kidney disease    Proteinuria    Renal insufficiency    Tennis elbow     Past Surgical History:  Procedure Laterality Date   Heart catherization     HERNIA REPAIR     HIP SURGERY     Kidney removed      Home Medications:  Allergies as of 04/22/2020      Reactions   Other    Pt. Reports having seizures after anesthesia once.   Keflex [cephalexin] Rash      Medication List       Accurate as of April 22, 2020  9:25 AM. If you have any questions, ask your nurse or doctor.        allopurinol 100 MG tablet Commonly known as: ZYLOPRIM TAKE 2 TABLETS BY MOUTH DAILY.   ALPRAZolam 0.5 MG tablet Commonly known as: XANAX TAKE 1 TABLET BY MOUTH THREE TIMES DAILY AS NEEDED FOR ANXIETY.   aspirin 81 MG tablet Take 81 mg by mouth daily.   atorvastatin 80 MG tablet Commonly known as: LIPITOR TAKE (1) TABLET BY MOUTH ONCE DAILY.   cholecalciferol 25 MCG (1000 UNIT) tablet Commonly known as: VITAMIN D3 Take 2,000 Units by mouth daily.   diltiazem 240 MG 24 hr capsule Commonly known as: CARDIZEM CD TAKE (1) CAPSULE BY MOUTH DAILY.     ezetimibe 10 MG tablet Commonly known as: ZETIA Take 10 mg by mouth daily.   ezetimibe 10 MG tablet Commonly known as: ZETIA TAKE ONE TABLET BY MOUTH ONCE DAILY.   fluticasone 50 MCG/ACT nasal spray Commonly known as: FLONASE Place 2 sprays into both nostrils daily.   furosemide 20 MG tablet Commonly known as: LASIX Take 1 tablet (20 mg total) by mouth daily.   HYDROcodone-acetaminophen 5-325 MG tablet Commonly known as: NORCO/VICODIN TAKE 1 TABLET BY MOUTH EVERY SIX HOURS AS NEEDED FOR MODERATE PAIN.   lisinopril 20 MG tablet Commonly known as: ZESTRIL Take 10 mg by mouth daily.   metoprolol tartrate 50 MG tablet Commonly known as: LOPRESSOR TAKE ONE HALF TABLET BY MOUTH TWICE DAILY.   Nicotrol 10 MG inhaler Generic drug: nicotine INHALE 1 CARTRIDGE INTO THE LUNGS AS NEEDED FOR SMOKING CESSATION.   nitroGLYCERIN 0.4 MG SL tablet Commonly known as: NITROSTAT Place 0.4 mg under the tongue every 5 (five) minutes as needed.   pantoprazole 40 MG tablet Commonly known as: PROTONIX TAKE ONE TABLET BY MOUTH TWICE DAILY.  Allergies:  Allergies  Allergen Reactions   Other     Pt. Reports having seizures after anesthesia once.   Keflex [Cephalexin] Rash    Family History  Problem Relation Age of Onset   Heart disease Other    Cancer Other    Kidney disease Other    Heart disease Father    Heart disease Mother     Social History:  reports that he quit smoking about 17 years ago. His smoking use included cigarettes. He started smoking about 49 years ago. He has a 48.00 pack-year smoking history. He has never used smokeless tobacco. He reports current alcohol use. He reports that he does not use drugs.  ROS: Urological Symptom Review Patient is experiencing the following symptoms: Frequent urination Hard to postpone urination Get up at night to urinate Leakage of urine Stream starts and stops Trouble starting stream Weak stream Review of  Systems Gastrointestinal (upper)  : Indigestion/heartburn Gastrointestinal (lower) : Negative for lower GI symptoms Constitutional : Night Sweats Fatigue Skin: Negative for skin symptoms Eyes: Negative for eye symptoms Ear/Nose/Throat : Sinus problems Hematologic/Lymphatic: Negative for Hematologic/Lymphatic symptoms Cardiovascular : Leg swelling Chest pain Respiratory : Shortness of breath Endocrine: Negative for endocrine symptoms Musculoskeletal: Back pain Joint pain Neurological: Negative for neurological symptoms Psychologic: Depression Anxiety  Physical Exam:  Vital signs in last 24 hours: BP (!) 146/80    Pulse 62    Temp 98.2 F (36.8 C)    Ht 6' (1.829 m)    Wt 221 lb (100.2 kg)    BMI 29.97 kg/m  Constitutional:  Alert and oriented, No acute distress Cardiovascular: Regular rate  Respiratory: Normal respiratory effort GI: Abdomen is soft, nontender, nondistended, no abdominal masses. No CVAT.  Rt inguinal hernia Genitourinary: Normal male phallus, testes are descended bilaterally and non-tender and without masses, scrotum is normal in appearance without lesions or masses, perineum is normal on inspection. Prostate 30 gms. Lymphatic: No lymphadenopathy Neurologic: Grossly intact, no focal deficits Psychiatric: Normal mood and affect  I have reviewed prior pt notes  I have reviewed notes from referring/previous physicians  I have reviewed urinalysis results  I have independently reviewed prior imaging  I have reviewed prior PSA results    Impression/Assessment:  Elevated PSA w/ nml exam--positive FH.  Plan:  1.  I have recommended that we proceed with ultrasound and biopsy of the prostate  2.  Risks and complications were discussed.  We will get that set up within the next few weeks.

## 2020-04-22 NOTE — Patient Instructions (Addendum)
   Patient Name: Takeru Bose Appointment Time 8:15 arrival Appointment Date: July 27 th, 2021  Location: [facility] Radiology Department   Prostate Biopsy Instructions  Stop all aspirin or blood thinners (aspirin, plavix, coumadin, warfarin, motrin, ibuprofen, advil, aleve, naproxen, naprosyn) for 7 days prior to the procedure.  If you have any questions about stopping these medications, please contact your primary care physician or cardiologist.  Having a light meal prior to the procedure is recommended.  If you are diabetic or have low blood sugar please bring a small snack or glucose tablet.  A Fleets enema is needed to be purchased over the counter at a local pharmacy and used 2 hours before you scheduled appointment.  This can be purchased over the counter at any pharmacy.  Antibiotics will be administered in the clinic at the time of the procedure and 1 tablet has been sent to your pharmacy. Please take the antibiotic as prescribed.    Please bring someone with you to the procedure to drive you home.   If you have any questions or concerns, please feel free to call the office at [phone] or send a Mychart message.    Thank you, [facility]

## 2020-04-22 NOTE — Progress Notes (Signed)
See progress note.

## 2020-05-03 ENCOUNTER — Other Ambulatory Visit: Payer: Self-pay | Admitting: Family Medicine

## 2020-05-06 ENCOUNTER — Other Ambulatory Visit: Payer: PPO | Admitting: Urology

## 2020-05-19 ENCOUNTER — Other Ambulatory Visit: Payer: Self-pay | Admitting: Family Medicine

## 2020-05-19 DIAGNOSIS — M1000 Idiopathic gout, unspecified site: Secondary | ICD-10-CM

## 2020-05-19 DIAGNOSIS — F411 Generalized anxiety disorder: Secondary | ICD-10-CM

## 2020-05-19 NOTE — Telephone Encounter (Signed)
Last OV 01/31/20 Zyloprim last refilled 04/07/20 Hydrocodone last refill 04/21/20 Xanax Last refill 04/21/20

## 2020-05-20 ENCOUNTER — Ambulatory Visit (INDEPENDENT_AMBULATORY_CARE_PROVIDER_SITE_OTHER): Payer: PPO | Admitting: Urology

## 2020-05-20 ENCOUNTER — Other Ambulatory Visit: Payer: Self-pay

## 2020-05-20 ENCOUNTER — Ambulatory Visit (HOSPITAL_COMMUNITY)
Admission: RE | Admit: 2020-05-20 | Discharge: 2020-05-20 | Disposition: A | Discharge status: Home / Self Care | Payer: PPO | Source: Ambulatory Visit | Attending: Urology | Admitting: Urology

## 2020-05-20 ENCOUNTER — Other Ambulatory Visit: Payer: Self-pay | Admitting: Urology

## 2020-05-20 DIAGNOSIS — C61 Malignant neoplasm of prostate: Secondary | ICD-10-CM | POA: Insufficient documentation

## 2020-05-20 DIAGNOSIS — R972 Elevated prostate specific antigen [PSA]: Secondary | ICD-10-CM

## 2020-05-20 MED ORDER — GENTAMICIN SULFATE 40 MG/ML IJ SOLN
INTRAMUSCULAR | Status: AC
  Administered 2020-05-20: 160 mg via INTRAMUSCULAR
  Filled 2020-05-20: qty 4

## 2020-05-20 MED ORDER — GENTAMICIN SULFATE 40 MG/ML IJ SOLN: 160.0000 mg | Freq: Once | INTRAMUSCULAR | Status: AC

## 2020-05-20 MED ORDER — LIDOCAINE HCL (PF) 2 % IJ SOLN
INTRAMUSCULAR | Status: AC
  Administered 2020-05-20: 10 mL
  Filled 2020-05-20: qty 10

## 2020-05-20 MED ORDER — LIDOCAINE HCL (PF) 2 % IJ SOLN
INTRAMUSCULAR | Status: AC
  Filled 2020-05-20: qty 10

## 2020-05-20 NOTE — Progress Notes (Signed)
Risks, benefits, and some of the potential complications of a transrectal ultrasounds of the prostate (TRUSP) with biopsies were discussed at length with the patient including gross hematuria, blood in the bowel movements, hematospermia, bacteremia, infection, voiding discomfort, urinary retention, fever, chills, sepsis, blood transfusion, death, and others. All questions were answered. Informed consent was obtained. The patient confirmed that he had taken his pre-procedure antibiotic. All anticoagulants were discontinued prior to the procedure. The patient emptied his bladder. He was positioned in a comfortable left lateral decubitus position with hips and knees acutely flexed.  The rectal probe was inserted into the rectum without difficulty. 10cc of 2% Lidocaine without epinephrine was instilled with a spinal needle using ultrasound guidance near the junction of each seminal vesicle and the prostate.  Sequential transverse (axial) scans were made in small increments beginning at the seminal vesicles and ending at the prostatic apex. Sequential longitudinal (saggital) scans were made in small increments beginning at the right lateral prostate and ending at the left lateral prostate. Excellent anatomical imaging was obtained. The peripheral, transitional, and central zones were well-defined. The seminal vesicles were normal.~~  Prostate volume 34.7 ml.  There were no hypoechoic areas. 12 biopsies were performed. 1 biopsy each was taken from the following areas:  Right lateral base, right medial base, right lateral mid prostate, right medial mid prostate, right lateral apical prostate, right medial apical prostate, left lateral base, left medial base, left lateral mid prostate, left medial mid prostate, left lateral apical prostate, left medial apical prostate.. Minimal prostatic calcifications were noted. Excellent biopsy specimens were obtained.  Follow-up rectal examination was unremarkable. The  procedure was well-tolerated and without complications. Antibiotic instructions were given. The patient was told that:  For several days:  he should increase his fluid intake and limit strenuous activity  he might have mild discomfort at the base of his penis or in his rectum  he might have blood in his urine or blood in his bowel movements  For 2-3 months:  he might have blood in his ejaculate (semen)  Instructions were given to call the office immedicately for blood clots in the urine or bowel movements, difficulty urinating, inability to urinate, urinary retention, painful or frequent urination, fever, chills, nausea, vomiting, or other illness. The patient stated that he understood these instructions and would comply with them. We told the patient that prostate biopsy pathology reports are usually available within 3-5 working days, unless a pathologic second opinion is required, which may take 7-14 days. We told him to contact us to check on the status of his biopsy if he has not heard from [place] within 7 days. The patient left the ultrasound examination room in stable condition.34.7

## 2020-05-27 ENCOUNTER — Telehealth: Payer: Self-pay

## 2020-05-27 NOTE — Telephone Encounter (Signed)
Patient called wanting to know prostate biopsy results

## 2020-05-28 ENCOUNTER — Telehealth: Payer: Self-pay

## 2020-05-28 NOTE — Telephone Encounter (Signed)
Called pt. Notified him appt made, copy of last visit and appt mailed and copy sent to his PCP.

## 2020-05-28 NOTE — Telephone Encounter (Signed)
-----   Message from Franchot Gallo, MD sent at 05/27/2020  1:41 PM EDT ----- I called w/ results-please call to set up [place] conference as well as  send results to pt/PCP ----- Message ----- From: Dorisann Frames, RN Sent: 05/21/2020   5:05 PM EDT To: Franchot Gallo, MD  Please review

## 2020-05-28 NOTE — Progress Notes (Signed)
Pts paperwork faxed to pcp and mailed to pt. And appt scheduled.

## 2020-06-02 ENCOUNTER — Telehealth: Payer: Self-pay

## 2020-06-02 NOTE — Telephone Encounter (Signed)
Pt left message to have nurse call. I returned pt call- no answer. Left message to return call

## 2020-06-12 ENCOUNTER — Telehealth: Payer: Self-pay | Admitting: Family Medicine

## 2020-06-12 NOTE — Progress Notes (Signed)
°  Chronic Care Management   Outreach Note  06/12/2020 Name: Brandon Buck MRN: [id number] DOB: 1961/02/16  Referred by: [name], MD Reason for referral : No chief complaint on file.   An unsuccessful telephone outreach was attempted today. The patient was referred to the pharmacist for assistance with care management and care coordination.   Follow Up Plan:   Carley Perdue [employer] Scheduler

## 2020-06-13 ENCOUNTER — Telehealth: Payer: Self-pay

## 2020-06-13 NOTE — Telephone Encounter (Signed)
Left message with pt- confirmed 06/24/20 appt and no sooner appt available.

## 2020-06-13 NOTE — Telephone Encounter (Signed)
-----   Message from Charlestine Massed sent at 06/05/2020 10:50 AM EDT ----- Contact: [phone] Pt called and is asking if we could get him in sooner for his cancer talk?

## 2020-06-18 ENCOUNTER — Other Ambulatory Visit: Payer: Self-pay | Admitting: Family Medicine

## 2020-06-18 DIAGNOSIS — F411 Generalized anxiety disorder: Secondary | ICD-10-CM

## 2020-06-18 NOTE — Telephone Encounter (Signed)
Ok to refill??  Last office visit 01/31/2020.  Last refill 05/19/2020 on both.

## 2020-06-19 ENCOUNTER — Encounter (HOSPITAL_COMMUNITY): Payer: Self-pay | Admitting: Emergency Medicine

## 2020-06-19 ENCOUNTER — Emergency Department (HOSPITAL_COMMUNITY)
Admission: EM | Admit: 2020-06-19 | Discharge: 2020-06-19 | Disposition: A | Discharge status: Home / Self Care | Payer: PPO | Attending: Emergency Medicine | Admitting: Emergency Medicine

## 2020-06-19 ENCOUNTER — Other Ambulatory Visit: Payer: Self-pay

## 2020-06-19 DIAGNOSIS — C649 Malignant neoplasm of unspecified kidney, except renal pelvis: Secondary | ICD-10-CM | POA: Diagnosis not present

## 2020-06-19 DIAGNOSIS — N183 Chronic kidney disease, stage 3 unspecified: Secondary | ICD-10-CM | POA: Diagnosis not present

## 2020-06-19 DIAGNOSIS — I129 Hypertensive chronic kidney disease with stage 1 through stage 4 chronic kidney disease, or unspecified chronic kidney disease: Secondary | ICD-10-CM | POA: Diagnosis not present

## 2020-06-19 DIAGNOSIS — M545 Low back pain, unspecified: Secondary | ICD-10-CM

## 2020-06-19 DIAGNOSIS — Z79899 Other long term (current) drug therapy: Secondary | ICD-10-CM | POA: Insufficient documentation

## 2020-06-19 DIAGNOSIS — Z87891 Personal history of nicotine dependence: Secondary | ICD-10-CM | POA: Insufficient documentation

## 2020-06-19 MED ORDER — FAMOTIDINE 20 MG PO TABS
20.0000 mg | ORAL_TABLET | Freq: Two times a day (BID) | ORAL | 0 refills | Status: DC
Start: 2020-06-19 — End: 2020-10-02

## 2020-06-19 MED ORDER — DEXAMETHASONE SODIUM PHOSPHATE 10 MG/ML IJ SOLN
10.0000 mg | Freq: Once | INTRAMUSCULAR | Status: AC
  Administered 2020-06-19: 10 mg via INTRAMUSCULAR
  Filled 2020-06-19: qty 1

## 2020-06-19 MED ORDER — PREDNISONE 20 MG PO TABS
ORAL_TABLET | ORAL | 0 refills | Status: DC
Start: 2020-06-19 — End: 2020-08-29

## 2020-06-19 MED ORDER — CYCLOBENZAPRINE HCL 5 MG PO TABS
5.0000 mg | ORAL_TABLET | Freq: Three times a day (TID) | ORAL | 0 refills | Status: DC | PRN
Start: 2020-06-19 — End: 2020-09-02

## 2020-06-19 MED ORDER — METHOCARBAMOL 500 MG PO TABS
500.0000 mg | ORAL_TABLET | Freq: Once | ORAL | Status: AC
  Administered 2020-06-19: 500 mg via ORAL
  Filled 2020-06-19: qty 1

## 2020-06-19 NOTE — ED Provider Notes (Addendum)
[facility] EMERGENCY DEPARTMENT Provider Note   CSN: [id number] Arrival date & time: 06/19/20  0051   Time seen 4:10 AM  History Chief Complaint  Patient presents with  . Back Pain    Brandon Buck is a 59 y.o. male.  HPI   Patient states he has had low back pain for years.  He states it has been managed by his primary care doctor.  He states he takes Vicodin 90 tablets a month as needed.  He states on the 22nd he was moving a large [employer] that was over 6 feet tall by himself with a dolly.  He states he had to struggle with it.  He states it has made his low back pain worse.  He states now it radiating to both groin and into his testicles.  He states it hurts if he coughs or stands up.  He states he also feels like he collapses if he stands up and puts weight on his legs.  It feels a little better if he lays flat.  He denies any new numbness of his extremities.  He denies any incontinence.  He does not recall if he has ever had steroid injections in his back in the past.  He thinks he has seen the local orthopedist for his back.  He states he recently had a prostate biopsy and he is being evaluated for prostate cancer.  PCP [name], MD     Past Medical History:  Diagnosis Date  . Anxiety   . Arthritis   . Cancer ([place])    Kidney  . Chronic back pain   . Chronic hip pain, left   . CKD (chronic kidney disease) stage 3, GFR 30-59 ml/min   . Depression   . GERD (gastroesophageal reflux disease)   . Gout   . Heart disease   . High blood pressure   . History of PTCA 2    2 stents  . History of stomach ulcers   . Hypercholesteremia   . Hypertension   . Kidney disease   . Proteinuria   . Renal insufficiency   . Tennis elbow     Patient Active Problem List   Diagnosis Date Noted  . CKD (chronic kidney disease) stage 3, GFR 30-59 ml/min   . Proteinuria   . History of PTCA 2   . Gout   . Plantar fasciitis 03/01/2012  . HIP PAIN 11/11/2008  .  ASEPTIC NECROSIS 11/11/2008    Past Surgical History:  Procedure Laterality Date  . Heart catherization    . HERNIA REPAIR    . HIP SURGERY    . Kidney removed         Family History  Problem Relation Age of Onset  . Heart disease Other   . Cancer Other   . Kidney disease Other   . Heart disease Father   . Heart disease Mother     Social History   Tobacco Use  . Smoking status: Former Smoker    Packs/day: 1.50    Years: 32.00    Pack years: 48.00    Types: Cigarettes    Start date: 01/26/1971    Quit date: 01/26/2003    Years since quitting: 17.4  . Smokeless tobacco: Never Used  Vaping Use  . Vaping Use: Never used  Substance Use Topics  . Alcohol use: Yes    Alcohol/week: 0.0 standard drinks    Comment: occ  . Drug use: No  Home Medications Prior to Admission medications   Medication Sig Start Date End Date Taking? Authorizing Provider  allopurinol (ZYLOPRIM) 100 MG tablet TAKE 2 TABLETS BY MOUTH DAILY. 05/19/20   [name], MD  ALPRAZolam Duanne Moron) 0.5 MG tablet TAKE 1 TABLET BY MOUTH THREE TIMES DAILY AS NEEDED FOR ANXIETY. 05/19/20   [name], MD  aspirin 81 MG tablet Take 81 mg by mouth daily.    [provider]  atorvastatin (LIPITOR) 80 MG tablet TAKE (1) TABLET BY MOUTH ONCE DAILY. 05/08/20   [name], MD  cholecalciferol (VITAMIN D3) 25 MCG (1000 UNIT) tablet Take 2,000 Units by mouth daily.    [provider]  cyclobenzaprine (FLEXERIL) 5 MG tablet Take 1 tablet (5 mg total) by mouth 3 (three) times daily as needed. 06/19/20   Rolland Porter, MD  diltiazem (CARDIZEM CD) 240 MG 24 hr capsule TAKE (1) CAPSULE BY MOUTH DAILY. 02/09/19   [name], MD  ezetimibe (ZETIA) 10 MG tablet TAKE ONE TABLET BY MOUTH ONCE DAILY. 04/11/20   [name], MD  ezetimibe (ZETIA) 10 MG tablet Take 10 mg by mouth daily.    [provider]  famotidine (PEPCID) 20 MG tablet Take 1 tablet (20 mg total) by mouth 2 (two)  times daily. 06/19/20   Rolland Porter, MD  fluticasone (FLONASE) 50 MCG/ACT nasal spray Place 2 sprays into both nostrils daily. 01/29/19   [name], MD  furosemide (LASIX) 20 MG tablet Take 1 tablet (20 mg total) by mouth daily. 03/14/20   Carmin Muskrat, MD  HYDROcodone-acetaminophen (NORCO/VICODIN) 5-325 MG tablet TAKE 1 TABLET BY MOUTH EVERY SIX HOURS AS NEEDED FOR MODERATE PAIN. 05/19/20   [name], MD  lisinopril (ZESTRIL) 20 MG tablet Take 10 mg by mouth daily.    [provider]  metoprolol tartrate (LOPRESSOR) 50 MG tablet TAKE ONE HALF TABLET BY MOUTH TWICE DAILY. 12/08/18   [name], MD  NICOTROL 10 MG inhaler INHALE 1 CARTRIDGE INTO THE LUNGS AS NEEDED FOR SMOKING CESSATION. 07/13/19   [name], MD  nitroGLYCERIN (NITROSTAT) 0.4 MG SL tablet Place 0.4 mg under the tongue every 5 (five) minutes as needed.    [provider]  pantoprazole (PROTONIX) 40 MG tablet TAKE ONE TABLET BY MOUTH TWICE DAILY. 11/05/19   [name], MD  predniSONE (DELTASONE) 20 MG tablet Take 3 po QD x 3d , then 2 po QD x 3d then 1 po QD x 3d 06/19/20   Rolland Porter, MD    Allergies    Other and Keflex [cephalexin]  Review of Systems   Review of Systems  All other systems reviewed and are negative.   Physical Exam Updated Vital Signs BP (S) (!) 158/122 (BP Location: Right Arm) Comment: pt wont relax arm  Pulse 81   Temp 98 F (36.7 C) (Oral)   Resp (!) 23   Ht 6' (1.829 m)   Wt 99.8 kg   SpO2 100%   BMI 29.84 kg/m   Physical Exam Vitals and nursing note reviewed. Exam conducted with a chaperone present.  Constitutional:      General: He is not in acute distress.    Appearance: Normal appearance. He is normal weight.  HENT:     Head: Normocephalic and atraumatic.     Right Ear: External ear normal.     Left Ear: External ear normal.  Eyes:     Extraocular Movements: Extraocular movements intact.  Conjunctiva/sclera: Conjunctivae normal.    Cardiovascular:     Rate and Rhythm: Normal rate.  Pulmonary:     Effort: Pulmonary effort is normal. No respiratory distress.  Genitourinary:    Comments: Patient has normal external genitalia to visualization.  His testicles are normal to small in size.  Both are tender.  There is no nodules felt, they are not enlarged or firm to touch.  He has some mild discomfort in the inguinal area however I do not feel any obvious hernias. Musculoskeletal:     Cervical back: Normal range of motion.       Back:     Comments: Patient seems uncomfortable when he changes positions.  When I palpate his back he states it is nontender in the midline thoracic and lumbar spine until I get down into the sacral area.  He also is very tender over the bilateral SI joints that reproduces a lot of his pain.  He has positive straight leg raising bilaterally.  He has 2+ patellar reflexes bilaterally.  However when he does range of motion of the lumbar spine it does not seem to cause a lot of pain however he does have restricted movement.  Skin:    General: Skin is warm and dry.  Neurological:     General: No focal deficit present.     Mental Status: He is alert and oriented to person, place, and time.     Cranial Nerves: No cranial nerve deficit.  Psychiatric:        Mood and Affect: Mood normal.        Behavior: Behavior normal.        Thought Content: Thought content normal.     ED Results / Procedures / Treatments   Labs (all labs ordered are listed, but only abnormal results are displayed) Labs Reviewed - No data to display  EKG None  Radiology No results found.  Procedures Procedures (including critical care time)  Medications Ordered in ED Medications  dexamethasone (DECADRON) injection 10 mg (10 mg Intramuscular Given 06/19/20 0443)  methocarbamol (ROBAXIN) tablet 500 mg (500 mg Oral Given 06/19/20 0443)    ED Course  I have reviewed the triage vital signs and the nursing notes.  Pertinent  labs & imaging results that were available during my care of the patient were reviewed by me and considered in my medical decision making (see chart for details).    MDM Rules/Calculators/A&P                          Patient is here with acute on chronic low back pain.  Despite having years of back problems patient tried to move a large cabinet by himself right before the pain got worse.  When I review the [place] he gets #90 hydrocodone from his primary care doctor, it was last filled July 26.  He also gets #30 alprazolam 0.5 mg tablets monthly.  Patient states he drove himself to the ED.  When I review his recent labs in May his BUN was 27 and his creatinine was 1.90.  His blood sugar was 104.  He was given Decadron IM and Robaxin low-dose orally.  He also has a history of stomach ulcers, I discharged him home with prednisone with Pepcid to hopefully prevent him getting a gastritis and muscle relaxer.  He should follow-up with his primary care if is not improving.  X-rays were not done at this point since he  had moderate pain before and had a mechanism of injury to make it worse.  However if his pain does not improve consideration should be made for radiographically looking at his sacral area.   Final Clinical Impression(s) / ED Diagnoses Final diagnoses:  Bilateral low back pain without sciatica, unspecified chronicity    Rx / DC Orders ED Discharge Orders         Ordered    predniSONE (DELTASONE) 20 MG tablet        06/19/20 0439    famotidine (PEPCID) 20 MG tablet  2 times daily        06/19/20 0439    cyclobenzaprine (FLEXERIL) 5 MG tablet  3 times daily PRN        06/19/20 0439         Plan discharge  Rolland Porter, MD, Barbette Or, MD 06/19/20 [place], [place], MD 06/19/20 [phone]

## 2020-06-19 NOTE — ED Triage Notes (Signed)
Pt c/o back, lower abdomen, and groin pain that started after he was moving a cabinet on Sunday.

## 2020-06-19 NOTE — Discharge Instructions (Addendum)
Use ice and heat for comfort.  Take the medication as prescribed.  Please follow-up with Dr. Dennard Schaumann if your back pain is not improving.  If you need more of your pain pills you will need to contact Dr. Samella Parr office.

## 2020-06-24 ENCOUNTER — Other Ambulatory Visit: Payer: Self-pay

## 2020-06-24 ENCOUNTER — Ambulatory Visit (INDEPENDENT_AMBULATORY_CARE_PROVIDER_SITE_OTHER): Payer: PPO | Admitting: Urology

## 2020-06-24 ENCOUNTER — Encounter: Payer: Self-pay | Admitting: Urology

## 2020-06-24 DIAGNOSIS — R300 Dysuria: Secondary | ICD-10-CM

## 2020-06-24 LAB — MICROSCOPIC EXAMINATION
Bacteria, UA: NONE SEEN
Epithelial Cells (non renal): NONE SEEN /hpf (ref 0–10)
Renal Epithel, UA: NONE SEEN /hpf
WBC, UA: NONE SEEN /hpf (ref 0–5)

## 2020-06-24 LAB — URINALYSIS, ROUTINE W REFLEX MICROSCOPIC
Bilirubin, UA: NEGATIVE
Ketones, UA: NEGATIVE
Leukocytes,UA: NEGATIVE
Nitrite, UA: NEGATIVE
Specific Gravity, UA: 1.025 (ref 1.005–1.030)
Urobilinogen, Ur: 1 mg/dL (ref 0.2–1.0)
pH, UA: 6 (ref 5.0–7.5)

## 2020-06-24 NOTE — Progress Notes (Signed)
Urological Symptom Review  Patient is experiencing the following symptoms: Frequent urination Get up at night to urinate Leakage of urine Stream starts and stops Trouble starting stream Blood in urine Weak stream   Review of Systems  Gastrointestinal (upper)  : Indigestion/heartburn  Gastrointestinal (lower) : Negative for lower GI symptoms  Constitutional : Night Sweats Fatigue Negative for symptoms  Skin: Negative for skin symptoms  Eyes: Negative for eye symptoms  Ear/Nose/Throat : Sinus problems  Hematologic/Lymphatic: Negative for Hematologic/Lymphatic symptoms  Cardiovascular : Leg swelling  Respiratory : Shortness of breath  Endocrine: Negative for endocrine symptoms  Musculoskeletal: Back pain Joint pain  Neurological: Negative for neurological symptoms  Psychologic: Anxiety

## 2020-06-24 NOTE — Progress Notes (Signed)
H&P  Chief Complaint: New Dx of prostate cancer  History of Present Illness: This 59 year old male presents for prostate cancer consultation.  He underwent ultrasound and biopsy of his prostate on 7.27.2021.  Prostate volume 34.7 mL, PSA 5.3, PSAD 0.15.  3/12 cores revealed adenocarcinoma as follows:  Left apex lateral GS 3+3 and 95% of core Right apex medial GS 3+4 and 40% of core Right mid lateral, GS 3+3 in 10% of core   Past Medical History:  Diagnosis Date  . Anxiety   . Arthritis   . Cancer ([place])    Kidney  . Chronic back pain   . Chronic hip pain, left   . CKD (chronic kidney disease) stage 3, GFR 30-59 ml/min   . Depression   . GERD (gastroesophageal reflux disease)   . Gout   . Heart disease   . High blood pressure   . History of PTCA 2    2 stents  . History of stomach ulcers   . Hypercholesteremia   . Hypertension   . Kidney disease   . Proteinuria   . Renal insufficiency   . Tennis elbow     Past Surgical History:  Procedure Laterality Date  . Heart catherization    . HERNIA REPAIR    . HIP SURGERY    . Kidney removed      Home Medications:  Allergies as of 06/24/2020      Reactions   Other    Pt. Reports having seizures after anesthesia once.   Keflex [cephalexin] Rash      Medication List       Accurate as of June 24, 2020  8:03 AM. If you have any questions, ask your nurse or doctor.        allopurinol 100 MG tablet Commonly known as: ZYLOPRIM TAKE 2 TABLETS BY MOUTH DAILY.   ALPRAZolam 0.5 MG tablet Commonly known as: XANAX TAKE 1 TABLET BY MOUTH THREE TIMES DAILY AS NEEDED FOR ANXIETY.   aspirin 81 MG tablet Take 81 mg by mouth daily.   atorvastatin 80 MG tablet Commonly known as: LIPITOR TAKE (1) TABLET BY MOUTH ONCE DAILY.   cholecalciferol 25 MCG (1000 UNIT) tablet Commonly known as: VITAMIN D3 Take 2,000 Units by mouth daily.   cyclobenzaprine 5 MG tablet Commonly known as: FLEXERIL Take 1 tablet (5 mg total) by  mouth 3 (three) times daily as needed.   diltiazem 240 MG 24 hr capsule Commonly known as: CARDIZEM CD TAKE (1) CAPSULE BY MOUTH DAILY.   ezetimibe 10 MG tablet Commonly known as: ZETIA Take 10 mg by mouth daily.   ezetimibe 10 MG tablet Commonly known as: ZETIA TAKE ONE TABLET BY MOUTH ONCE DAILY.   famotidine 20 MG tablet Commonly known as: PEPCID Take 1 tablet (20 mg total) by mouth 2 (two) times daily.   fluticasone 50 MCG/ACT nasal spray Commonly known as: FLONASE Place 2 sprays into both nostrils daily.   furosemide 20 MG tablet Commonly known as: LASIX Take 1 tablet (20 mg total) by mouth daily.   HYDROcodone-acetaminophen 5-325 MG tablet Commonly known as: NORCO/VICODIN TAKE 1 TABLET BY MOUTH EVERY SIX HOURS AS NEEDED FOR MODERATE PAIN.   lisinopril 20 MG tablet Commonly known as: ZESTRIL Take 10 mg by mouth daily.   metoprolol tartrate 50 MG tablet Commonly known as: LOPRESSOR TAKE ONE HALF TABLET BY MOUTH TWICE DAILY.   Nicotrol 10 MG inhaler Generic drug: nicotine INHALE 1 CARTRIDGE INTO THE LUNGS AS NEEDED FOR  SMOKING CESSATION.   nitroGLYCERIN 0.4 MG SL tablet Commonly known as: NITROSTAT Place 0.4 mg under the tongue every 5 (five) minutes as needed.   pantoprazole 40 MG tablet Commonly known as: PROTONIX TAKE ONE TABLET BY MOUTH TWICE DAILY.   predniSONE 20 MG tablet Commonly known as: DELTASONE Take 3 po QD x 3d , then 2 po QD x 3d then 1 po QD x 3d       Allergies:  Allergies  Allergen Reactions  . Other     Pt. Reports having seizures after anesthesia once.  Marland Kitchen Keflex [Cephalexin] Rash    Family History  Problem Relation Age of Onset  . Heart disease Other   . Cancer Other   . Kidney disease Other   . Heart disease Father   . Heart disease Mother     Social History:  reports that he quit smoking about 17 years ago. His smoking use included cigarettes. He started smoking about 49 years ago. He has a 48.00 pack-year smoking  history. He has never used smokeless tobacco. He reports current alcohol use. He reports that he does not use drugs.  ROS: Urological Symptom Review  Patient is experiencing the following symptoms: Frequent urination Get up at night to urinate Leakage of urine Stream starts and stops Trouble starting stream Blood in urine Weak stream   Review of Systems  Gastrointestinal (upper)  : Indigestion/heartburn  Gastrointestinal (lower) : Negative for lower GI symptoms  Constitutional : Night Sweats Fatigue Negative for symptoms  Skin: Negative for skin symptoms  Eyes: Negative for eye symptoms  Ear/Nose/Throat : Sinus problems  Hematologic/Lymphatic: Negative for Hematologic/Lymphatic symptoms  Cardiovascular : Leg swelling  Respiratory : Shortness of breath  Endocrine: Negative for endocrine symptoms  Musculoskeletal: Back pain Joint pain  Neurological: Negative for neurological symptoms  Psychologic: Anxiety   Physical Exam:  Vital signs in last 24 hours: There were no vitals taken for this visit. Constitutional:  Alert and oriented, No acute distress Cardiovascular: Regular rate  Respiratory: Normal respiratory effort Neurologic: Grossly intact, no focal deficits Psychiatric: Normal mood and affect  I have reviewed prior pt notes  I have reviewed notes from referring/previous physicians  I have reviewed urinalysis results  I have independently reviewed prior imaging  I have reviewed prior PSA/pathology results   Impression/Assessment:  Prostate cancer, new diagnosis, favorable intermediate risk.  Patient does have several medical comorbidities.  IPSS 17, although he does have frequency due to some urinary symptoms after his biopsy which may well be due to infection.  He has a normal size prostate.  He does have moderate erectile dysfunction.  Plan: I have discussed pathology of the patient's biopsy with him,  its relationship with his  PSA, as well as long-term treatment options.  These include EBRT, brachytherapy, robotic prostatectomy and less common procedures such as high-intensity focused ultrasound and cryoablation.  Risks and benefits of all of these were also discussed as well as long-term complications.  Considering his medical comorbidities, I think brachytherapy is an excellent choice for him seeing that he has favorable intermediate risk disease.  He will take some time to think about where he wants to go with this i.e. whether he wants to discuss with the radiation oncologist or consider consultation with a robotic surgeon.  He will get back with me regarding his decision.  53 minutes was spent with the patient and his wife, all of this in face-to-face direct consultation.

## 2020-06-24 NOTE — Patient Instructions (Signed)
Brachytherapy for Prostate Cancer Brachytherapy for prostate cancer is radiation treatment that is placed inside of the prostate (prostate gland). There are several types of brachytherapy:  Low-dose rate (LDR) therapy. This may involve temporary implants or permanent radioactive seed or pellet implants. The radiation does not travel far from the prostate, which means that healthy, noncancerous tissues around the prostate receive only a small dose of radiation. This helps to protect those tissues from injury. This type of treatment may be followed by a course of external beam radiation. ? Temporary low-dose implants are left in the prostate for 1-7 days. The implants are needles, applicators, or thin, plastic tubes (catheters) that contain radioactive material. You will need to stay in the hospital while the implant is in place. ? Permanent low-dose implants (seeds or pellets) are injected into the prostate, and they work for up to one year after they are inserted. They are left in place and are not removed.  High-dose rate (HDR) therapy. This is given through needles, applicators, or catheters that contain radioactive material. The tubes are removed after treatment, and no radiation is left in the prostate. This type of treatment may be followed by a course of external beam radiation. Tell a health care provider about:  Any allergies you have.  All medicines you are taking, including vitamins, herbs, eye drops, creams, and over-the-counter medicines.  Any problems you or family members have had with anesthetic medicines.  Any surgeries you have had.  Any blood disorders you have.  Any medical conditions you have. What are the risks? Generally, this is a safe procedure. However, problems may occur, including:  Inflammation of the rectum.  Problems getting or keeping an erection (erectile dysfunction).  Trouble urinating.  Diarrhea.  Bleeding.  Loss of bowel control. What happens  before the procedure? Staying hydrated Follow instructions from your health care provider about hydration, which may include:  Up to 2 hours before the procedure - you may continue to drink clear liquids, such as water, clear fruit juice, black coffee, and plain tea. Eating and drinking Follow instructions from your health care provider about eating and drinking, which may include:  8 hours before the procedure - stop eating heavy meals or foods such as meat, fried foods, or fatty foods.  6 hours before the procedure - stop eating light meals or foods, such as toast or cereal.  6 hours before the procedure - stop drinking milk or drinks that contain milk.  2 hours before the procedure - stop drinking clear liquids. Medicines  Ask your health care provider about: ? Changing or stopping your regular medicines. This is especially important if you are taking diabetes medicines or blood thinners. ? Taking medicines such as aspirin and ibuprofen. These medicines can thin your blood. Do not take these medicines before your procedure if your health care provider instructs you not to.  You may be given antibiotic medicine to help prevent infection. General instructions  Plan to have someone take you home from the hospital or clinic.  If you will be going home right after the procedure, plan to have someone with you for 24 hours.  You may have imaging tests done, including an ultrasound, CT scan, or MRI.  You may have blood tests done.  You may have a test to check the electrical signals in your heart (electrocardiogram).  You may need to take medicine to clean out your bowel (bowel prep). What happens during the procedure?  To lower your risk of   infection: ? Your health care team will wash or sanitize their hands. ? Your skin will be washed with soap. ? Hair may be removed from the surgical area.  An IV will be inserted into one of your veins.  You will be given one or more of the  following: ? A medicine to help you relax (sedative). ? A medicine to numb the area (local anesthetic). ? A medicine to make you fall asleep (general anesthetic).  You may have a thin, plastic tube (catheter) inserted to drain your bladder.  If you are receiving brachytherapy with implants: ? A needle, applicator, or catheter will be inserted into the prostate. It will be inserted through a body cavity, such as the rectum, or through the tissue between the testicles and the anus (perineum). ? An X-ray, ultrasound, MRI, or CT scan will be used to guide the catheter or applicator toward the prostate. ? Radioactive seeds, wires, or ribbons will be fed through the catheter or applicator. ? If the high-dose method is used:  The radioactive wires or ribbons will be left in for a few minutes and then removed.  Once the treatment is finished, the catheter or applicator will be removed. ? If the low-dose method is used, the implant will stay in place for 1-7 days.  You will remain in the hospital while the implant is in place.  Once the treatment is finished, the radioactive material and catheter will be removed.  If you are receiving permanent, low-dose brachytherapy: ? Small, radioactive seeds or pellets will be injected into your prostate. This may be done through a catheter, needle, or applicator. ? The catheter or applicator will be removed, leaving the seeds in the prostate. The procedure may vary among health care providers and hospitals. What happens after the procedure?  Your blood pressure, heart rate, breathing rate, and blood oxygen level will be monitored until the medicines you were given have worn off.  Do not drive for 24 hours if you were given a sedative. Summary  Brachytherapy for prostate cancer is radiation treatment placed inside of the prostate (prostate gland).  There are several types of brachytherapy for prostate cancer, including low-dose temporary treatment,  low-dose permanent treatment, and high-dose temporary treatment.  Temporary low-dose implants are left in the prostate for 1-7 days.  Permanent low-dose implants are injected into the prostate and left in place. They work for up to one year after they are inserted.  Permanent high-dose therapy is given through tubes that contain radioactive material. The tubes are removed after treatment, and no radiation is left in the prostate. This information is not intended to replace advice given to you by your health care provider. Make sure you discuss any questions you have with your health care provider. Document Revised: 09/23/2017 Document Reviewed: 10/20/2016 Elsevier Patient Education  2020 [employer]  

## 2020-06-26 ENCOUNTER — Telehealth: Payer: Self-pay | Admitting: Family Medicine

## 2020-06-26 NOTE — Progress Notes (Signed)
  Chronic Care Management   Note  06/26/2020 Name: Brandon Buck MRN: [id number] DOB: [birth year]  Brandon Buck is a 59 y.o. year old male who is a primary care patient of Pickard, [name], MD. I reached out to Despina Arias by phone today in response to a referral sent by [name] PCP, [name], MD.   [name] was given information about Chronic Care Management services today including:  1. CCM service includes personalized support from designated clinical staff supervised by his physician, including individualized plan of care and coordination with other care providers 2. 24/7 contact phone numbers for assistance for urgent and routine care needs. 3. Service will only be billed when office clinical staff spend 20 minutes or more in a month to coordinate care. 4. Only one practitioner may furnish and bill the service in a calendar month. 5. The patient may stop CCM services at any time (effective at the end of the month) by phone call to the office staff.   Patient agreed to services and verbal consent obtained.   Follow up plan:   Carley Perdue [employer] Scheduler

## 2020-06-27 ENCOUNTER — Other Ambulatory Visit: Payer: Self-pay | Admitting: Family Medicine

## 2020-06-27 DIAGNOSIS — M1000 Idiopathic gout, unspecified site: Secondary | ICD-10-CM

## 2020-07-08 ENCOUNTER — Other Ambulatory Visit: Payer: Self-pay | Admitting: Urology

## 2020-07-08 ENCOUNTER — Telehealth: Payer: Self-pay

## 2020-07-08 DIAGNOSIS — C61 Malignant neoplasm of prostate: Secondary | ICD-10-CM

## 2020-07-08 NOTE — Telephone Encounter (Signed)
-----   Message from Franchot Gallo, MD sent at 07/08/2020  9:17 AM EDT ----- I put in referral.consult for him to see Dr Tresa Moore ----- Message ----- From: Valentina Lucks, LPN Sent: 6/58/2608   4:42 PM EDT To: Franchot Gallo, MD  Pt called and said he has decided to have his prostate removed. He wants to know what to do next.

## 2020-07-08 NOTE — Telephone Encounter (Signed)
-----   Message from Franchot Gallo, MD sent at 07/08/2020  9:17 AM EDT ----- I put in referral.consult for him to see Dr Tresa Moore ----- Message ----- From: Valentina Lucks, LPN Sent: 7/53/0051   4:42 PM EDT To: Franchot Gallo, MD  Pt called and said he has decided to have his prostate removed. He wants to know what to do next.

## 2020-07-08 NOTE — Telephone Encounter (Signed)
Pt called and notified

## 2020-07-21 ENCOUNTER — Other Ambulatory Visit: Payer: Self-pay | Admitting: Family Medicine

## 2020-07-21 NOTE — Telephone Encounter (Signed)
Ok to refill??  Last office visit 01/31/2020.  Last refill 06/19/2020.

## 2020-07-22 ENCOUNTER — Other Ambulatory Visit: Payer: Self-pay | Admitting: Family Medicine

## 2020-07-22 DIAGNOSIS — F411 Generalized anxiety disorder: Secondary | ICD-10-CM

## 2020-07-23 ENCOUNTER — Other Ambulatory Visit: Payer: Self-pay

## 2020-07-23 DIAGNOSIS — F411 Generalized anxiety disorder: Secondary | ICD-10-CM

## 2020-07-23 MED ORDER — PANTOPRAZOLE SODIUM 40 MG PO TBEC: 40.0000 mg | DELAYED_RELEASE_TABLET | Freq: Two times a day (BID) | ORAL | 5 refills | Status: DC

## 2020-07-24 ENCOUNTER — Other Ambulatory Visit: Payer: PPO

## 2020-07-24 DIAGNOSIS — Z20822 Contact with and (suspected) exposure to covid-19: Secondary | ICD-10-CM

## 2020-07-24 MED ORDER — ALPRAZOLAM 0.5 MG PO TABS: 0.5000 mg | ORAL_TABLET | Freq: Three times a day (TID) | ORAL | 0 refills | Status: DC | PRN

## 2020-07-25 LAB — SARS-COV-2, NAA 2 DAY TAT

## 2020-07-25 LAB — NOVEL CORONAVIRUS, NAA: SARS-CoV-2, NAA: NOT DETECTED

## 2020-07-31 DIAGNOSIS — C61 Malignant neoplasm of prostate: Secondary | ICD-10-CM | POA: Diagnosis not present

## 2020-08-06 ENCOUNTER — Telehealth: Payer: Self-pay

## 2020-08-06 NOTE — Telephone Encounter (Signed)
Pt called yesterday wanting to know why he was called for a referral to [facility] at [facility]. Pt said the lady calling there said Dr. Diona Fanti sent referral to them. I looked all through chart and didn't find anything so I called Cindy at [facility] and she said Dr. Tresa Moore sent this pt. It was in his notes. I called pt back and notified him. Told him he would have to call [facility] about it.

## 2020-08-11 ENCOUNTER — Encounter: Payer: Self-pay | Admitting: Family Medicine

## 2020-08-14 NOTE — Chronic Care Management (AMB) (Addendum)
Chronic Care Management Pharmacy  Name: RALF KONOPKA  MRN: [id number] DOB: Aug 10, 1961  Chief Complaint/ HPI  Despina Arias,  59 y.o. , male presents for their Initial CCM visit with the clinical pharmacist In office.  PCP : [name], MD  Their chronic conditions include: CKD, Gout, HTN, HLD.  Office Visits: 01/31/2020 Dennard Schaumann) - Patient states cardiologist recently restarted lisinopril 68m daily due to proetinuria and increased Lasix to 241mtwice daily due to hyperkalemia. Labs ordered (PSA, Lipid panel, CMP).  His goal LDL will be < 70.  Consult Visit: 06/24/2020 (Urology) -  Patient was newly diagnosed with prostate cancer, he wanted some time to think about his treatment options 06/19/2020 (ED) -  Bilateral lower back pain.  Radiating to both groin and into testicles.  He is also being evaluated for prostate cancer.  Patient also has history of stomach ulcers, was given prednisone and pepcid to prevent the gastritis as well as a muscle relaxer.  Already of pain medication from PCP.  Medications: Outpatient Encounter Medications as of 08/18/2020  Medication Sig   allopurinol (ZYLOPRIM) 100 MG tablet TAKE 2 TABLETS BY MOUTH DAILY.   ALPRAZolam (XANAX) 0.5 MG tablet TAKE 1 TABLET BY MOUTH THREE TIMES DAILY AS NEEDED FOR ANXIETY.   aspirin 81 MG tablet Take 81 mg by mouth daily.   atorvastatin (LIPITOR) 80 MG tablet TAKE (1) TABLET BY MOUTH ONCE DAILY.   cholecalciferol (VITAMIN D3) 25 MCG (1000 UNIT) tablet Take 2,000 Units by mouth daily.   diltiazem (CARDIZEM CD) 240 MG 24 hr capsule TAKE (1) CAPSULE BY MOUTH DAILY.   ezetimibe (ZETIA) 10 MG tablet TAKE ONE TABLET BY MOUTH ONCE DAILY.   fluticasone (FLONASE) 50 MCG/ACT nasal spray Place 2 sprays into both nostrils daily.   furosemide (LASIX) 20 MG tablet Take 1 tablet (20 mg total) by mouth daily.   HYDROcodone-acetaminophen (NORCO/VICODIN) 5-325 MG tablet TAKE 1 TABLET BY MOUTH EVERY SIX HOURS AS NEEDED FOR  MODERATE PAIN.   lisinopril (ZESTRIL) 20 MG tablet Take 10 mg by mouth daily.   metoprolol tartrate (LOPRESSOR) 50 MG tablet TAKE ONE HALF TABLET BY MOUTH TWICE DAILY.   NICOTROL 10 MG inhaler INHALE 1 CARTRIDGE INTO THE LUNGS AS NEEDED FOR SMOKING CESSATION.   nitroGLYCERIN (NITROSTAT) 0.4 MG SL tablet Place 0.4 mg under the tongue every 5 (five) minutes as needed.   pantoprazole (PROTONIX) 40 MG tablet Take 1 tablet (40 mg total) by mouth 2 (two) times daily.   ALPRAZolam (XANAX) 0.5 MG tablet Take 1 tablet (0.5 mg total) by mouth 3 (three) times daily as needed. for anxiety (Patient not taking: Reported on 08/18/2020)   cyclobenzaprine (FLEXERIL) 5 MG tablet Take 1 tablet (5 mg total) by mouth 3 (three) times daily as needed. (Patient not taking: Reported on 08/18/2020)   ezetimibe (ZETIA) 10 MG tablet Take 10 mg by mouth daily. (Patient not taking: Reported on 08/18/2020)   famotidine (PEPCID) 20 MG tablet Take 1 tablet (20 mg total) by mouth 2 (two) times daily. (Patient not taking: Reported on 08/18/2020)   predniSONE (DELTASONE) 20 MG tablet Take 3 po QD x 3d , then 2 po QD x 3d then 1 po QD x 3d (Patient not taking: Reported on 08/18/2020)   [DISCONTINUED] allopurinol (ZYLOPRIM) 100 MG tablet TAKE 2 TABLETS BY MOUTH DAILY.   No facility-administered encounter medications on file as of 08/18/2020.     Current Diagnosis/Assessment:  Goals Addressed  This Visit's Progress    Pharmacy Care Plan:       CARE PLAN ENTRY (see longitudinal plan of care for additional care plan information)  Current Barriers:  Chronic Disease Management support, education, and care coordination needs related to Hypertension and Hyperlipidemia   Hypertension BP Readings from Last 3 Encounters:  06/19/20 (!) 164/78  05/20/20 (!) 167/84  04/22/20 (!) 146/80   Pharmacist Clinical Goal(s): Over the next 180 days, patient will work with PharmD and providers to achieve BP goal  <140/90 Current regimen:  Diltiazem 262m daily Metoprolol tartrate 520m Lisinopril 2044mnterventions: Counseled on blood pressure goals Discussed need for more regular monitoring Patient self care activities - Over the next 180 days, patient will: Check BP periodically, document, and provide at future appointments Ensure daily salt intake < 2300 mg/day  Hyperlipidemia Lab Results  Component Value Date/Time   LDLCALC 168 (H) 01/31/2020 08:19 AM   Pharmacist Clinical Goal(s): Over the next 180 days, patient will work with PharmD and providers to achieve LDL goal < 100 Current regimen:  Zetia 9m1morvastatin 80mg88merventions: Discussed importance of medication adherence Recommended follow up with PCP for updated lipid panel Patient self care activities - Over the next 180 days, patient will: Continue to focus on medication adherence by pill count Make appointment with PCP to get lab work updated   Initial goal documentation        Hypertension   BP goal is:  <140/90  Office blood pressures are  BP Readings from Last 3 Encounters:  06/19/20 (!) 164/78  05/20/20 (!) 167/84  04/22/20 (!) 146/80   Patient checks BP at home infrequently Patient home BP readings are ranging: 138/82 per patient[facility]  Patient has failed these meds in the past: none noted Patient is currently uncontrolled on the following medications:  Diltiazem 240mg 66my Metoprolol tartrate 50mg  2mnopril 20mg  W33mscussed  He denies dizziness, headaches Reports compliance with medication He takes all medication in the morning with the exception of the metoprolol he takes twice daily Counseled on BP goal < 140/90, encouraged him to contact providers with consistent readings above this.  He stated that it is not normally that hight.  Plan  Continue current medications, contact providers with increased BP, follow up with Dr. Pickard Dennard Schaumannppointments in Nov  regarding prostate cancer.     Hyperlipidemia   LDL goal < 100  Lipid Panel     Component Value Date/Time   CHOL 250 (H) 01/31/2020 0819   TRIG 242 (H) 01/31/2020 0819   HDL 41 01/31/2020 0819   LDLCALC 168 (H) 01/31/2020 0819    Hepatic Function Latest Ref Rng & Units 01/31/2020 04/24/2018 03/31/2017  Total Protein 6.1 - 8.1 g/dL 7.4 7.4 6.7  Albumin 3.6 - 5.1 g/dL - - 4.1  AST 10 - 35 U/L 37(H) 19 20  ALT 9 - 46 U/L 47(H) 19 23  Alk Phosphatase 40 - 115 U/L - - 63  Total Bilirubin 0.2 - 1.2 mg/dL 0.6 0.6 0.8  Bilirubin, Direct 0.0 - 0.3 mg/dL - - -     The 10-year ASCVD risk score (Goff DCMikey Bussing et al., 2013) is: 21.1%   Values used to calculate the score:     Age: 67 years80   Sex: Male     Is Non-Hispanic African American: No     Diabetic: No     Tobacco smoker: No     Systolic  Blood Pressure: 164 mmHg     Is BP treated: Yes     HDL Cholesterol: 41 mg/dL     Total Cholesterol: 250 mg/dL   Patient has failed these meds in past: none noted Patient is currently uncontrolled on the following medications:  Zetia 74m Atorvastatin 821m We discussed:   Admits he was not taking his lipitor for some reason at time of these labs, also started on Zetia after these results He has been compliant with medication ever since, not much change in his diet Reports he is very active around the house, no other exercise Counseled in importance of controlling cholesterol and goals  Plan  Continue current medications   CKD   Kidney Function Lab Results  Component Value Date/Time   CREATININE 1.90 (H) 03/14/2020 10:31 AM   CREATININE 2.12 (H) 01/31/2020 08:19 AM   CREATININE 1.71 (H) 04/24/2018 04:44 PM   GFRNONAA 37 (L) 03/14/2020 10:31 AM   GFRNONAA 33 (L) 01/31/2020 08:19 AM   GFRAA 43 (L) 03/14/2020 10:31 AM   GFRAA 38 (L) 01/31/2020 08:19 AM   K 4.4 03/14/2020 10:31 AM   K 4.7 01/31/2020 08:19 AM    Evaluated medication profile for safety using most recent kidney  function.  No issues noted.  Plan  Continue current medications Gout   Patient has failed these meds in past: none noted Patient is currently controlled on the following medications:  Allopurinol 1003mWe discussed:   Patient has not had any flares with gout lately on allopurinol. Reports continued adherence.  Plan  Continue current medications Vaccines   Reviewed and discussed patient's vaccination history.    Immunization History  Administered Date(s) Administered   Influenza Inj Mdck Quad With Preservative 07/25/2018   Influenza,inj,Quad PF,6+ Mos 08/06/2014   Influenza,inj,quad, With Preservative 07/06/2019   Influenza-Unspecified 08/01/2015, 07/06/2019   Moderna SARS-COVID-2 Vaccination 01/08/2020   Pneumococcal Polysaccharide-23 06/25/2009, 01/23/2018    Plan  Recommended patient receive Flu vaccine and COVID-19 booster.  He reports he is planning to get both. Medication Management   Miscellaneous medications:  Alprazolam 0.5mg57mn Norco 5-325mg37mtoprazole 40mg 55ms:  Vitamin D ASA 81mg P86mnt currently uses CarolinProduction designer, theatre/television/filmient reports using no specific method to organize medications and promote adherence. Patient denies missed doses of medication.   ChristiBeverly MilchD Clinical Pharmacist [phone] collaborated with the care management provider regarding care management and care coordination activities outlined in this encounter and have reviewed this encounter including documentation in the note and care plan. I am certifying that I agree with the content of this note and encounter as supervising physician.

## 2020-08-15 ENCOUNTER — Other Ambulatory Visit: Payer: Self-pay | Admitting: Family Medicine

## 2020-08-15 DIAGNOSIS — M1000 Idiopathic gout, unspecified site: Secondary | ICD-10-CM

## 2020-08-18 ENCOUNTER — Other Ambulatory Visit: Payer: Self-pay

## 2020-08-18 ENCOUNTER — Ambulatory Visit: Payer: PPO | Admitting: Pharmacist

## 2020-08-18 DIAGNOSIS — F411 Generalized anxiety disorder: Secondary | ICD-10-CM

## 2020-08-18 DIAGNOSIS — E7800 Pure hypercholesterolemia, unspecified: Secondary | ICD-10-CM

## 2020-08-18 DIAGNOSIS — I10 Essential (primary) hypertension: Secondary | ICD-10-CM

## 2020-08-18 NOTE — Patient Instructions (Addendum)
Visit Information Thank you for meeting with me today!  I look forward to working with you to help you meet all of your healthcare goals and answer any questions you may have.  Feel free to contact me anytime!  Goals Addressed            This Visit's Progress   . Pharmacy Care Plan:       CARE PLAN ENTRY (see longitudinal plan of care for additional care plan information)  Current Barriers:  . Chronic Disease Management support, education, and care coordination needs related to Hypertension and Hyperlipidemia   Hypertension BP Readings from Last 3 Encounters:  06/19/20 (!) 164/78  05/20/20 (!) 167/84  04/22/20 (!) 146/80   . Pharmacist Clinical Goal(s): o Over the next 180 days, patient will work with PharmD and providers to achieve BP goal <140/90 . Current regimen:  . Diltiazem 240mg  daily . Metoprolol tartrate 50mg   . Lisinopril 20mg  . Interventions: o Counseled on blood pressure goals o Discussed need for more regular monitoring . Patient self care activities - Over the next 180 days, patient will: o Check BP periodically, document, and provide at future appointments o Ensure daily salt intake < 2300 mg/day  Hyperlipidemia Lab Results  Component Value Date/Time   LDLCALC 168 (H) 01/31/2020 08:19 AM   . Pharmacist Clinical Goal(s): o Over the next 180 days, patient will work with PharmD and providers to achieve LDL goal < 100 . Current regimen:  . Zetia 10mg  . Atorvastatin 80mg  . Interventions: o Discussed importance of medication adherence o Recommended follow up with PCP for updated lipid panel . Patient self care activities - Over the next 180 days, patient will: o Continue to focus on medication adherence by pill count o Make appointment with PCP to get lab work updated   Initial goal documentation        Mr. Karczewski was given information about Chronic Care Management services today including:  1. CCM service includes personalized support from  designated clinical staff supervised by his physician, including individualized plan of care and coordination with other care providers 2. 24/7 contact phone numbers for assistance for urgent and routine care needs. 3. Standard insurance, coinsurance, copays and deductibles apply for chronic care management only during months in which we provide at least 20 minutes of these services. Most insurances cover these services at 100%, however patients may be responsible for any copay, coinsurance and/or deductible if applicable. This service may help you avoid the need for more expensive face-to-face services. 4. Only one practitioner may furnish and bill the service in a calendar month. 5. The patient may stop CCM services at any time (effective at the end of the month) by phone call to the office staff.  Patient agreed to services and verbal consent obtained.   The patient verbalized understanding of instructions provided today and agreed to receive a mailed copy of patient instruction and/or educational materials. Telephone follow up appointment with pharmacy team member scheduled for: 6 months  Beverly Milch, PharmD Clinical Pharmacist [facility] [phone]  High Cholesterol  High cholesterol is a condition in which the blood has high levels of a white, waxy, fat-like substance (cholesterol). The human body needs small amounts of cholesterol. The liver makes all the cholesterol that the body needs. Extra (excess) cholesterol comes from the food that we eat. Cholesterol is carried from the liver by the blood through the blood vessels. If you have high cholesterol, deposits (plaques) may build  up on the walls of your blood vessels (arteries). Plaques make the arteries narrower and stiffer. Cholesterol plaques increase your risk for heart attack and stroke. Work with your health care provider to keep your cholesterol levels in a healthy range. What increases the risk? This  condition is more likely to develop in people who:  Eat foods that are high in animal fat (saturated fat) or cholesterol.  Are overweight.  Are not getting enough exercise.  Have a family history of high cholesterol. What are the signs or symptoms? There are no symptoms of this condition. How is this diagnosed? This condition may be diagnosed from the results of a blood test.  If you are older than age 4, your health care provider may check your cholesterol every 4-6 years.  You may be checked more often if you already have high cholesterol or other risk factors for heart disease. The blood test for cholesterol measures:  "Bad" cholesterol (LDL cholesterol). This is the main type of cholesterol that causes heart disease. The desired level for LDL is less than 100.  "Good" cholesterol (HDL cholesterol). This type helps to protect against heart disease by cleaning the arteries and carrying the LDL away. The desired level for HDL is 60 or higher.  Triglycerides. These are fats that the body can store or burn for energy. The desired number for triglycerides is lower than 150.  Total cholesterol. This is a measure of the total amount of cholesterol in your blood, including LDL cholesterol, HDL cholesterol, and triglycerides. A healthy number is less than 200. How is this treated? This condition is treated with diet changes, lifestyle changes, and medicines. Diet changes  This may include eating more whole grains, fruits, vegetables, nuts, and fish.  This may also include cutting back on red meat and foods that have a lot of added sugar. Lifestyle changes  Changes may include getting at least 40 minutes of aerobic exercise 3 times a week. Aerobic exercises include walking, biking, and swimming. Aerobic exercise along with a healthy diet can help you maintain a healthy weight.  Changes may also include quitting smoking. Medicines  Medicines are usually given if diet and lifestyle  changes have failed to reduce your cholesterol to healthy levels.  Your health care provider may prescribe a statin medicine. Statin medicines have been shown to reduce cholesterol, which can reduce the risk of heart disease. Follow these instructions at home: Eating and drinking If told by your health care provider:  Eat chicken (without skin), fish, veal, shellfish, ground [place] breast, and round or loin cuts of red meat.  Do not eat fried foods or fatty meats, such as hot dogs and salami.  Eat plenty of fruits, such as apples.  Eat plenty of vegetables, such as broccoli, potatoes, and carrots.  Eat beans, peas, and lentils.  Eat grains such as barley, rice, couscous, and bulgur wheat.  Eat pasta without cream sauces.  Use skim or nonfat milk, and eat low-fat or nonfat yogurt and cheeses.  Do not eat or drink whole milk, cream, ice cream, egg yolks, or hard cheeses.  Do not eat stick margarine or tub margarines that contain trans fats (also called partially hydrogenated oils).  Do not eat saturated tropical oils, such as coconut oil and palm oil.  Do not eat cakes, cookies, crackers, or other baked goods that contain trans fats.  General instructions  Exercise as directed by your health care provider. Increase your activity level with activities such as gardening,  walking, and taking the stairs.  Take over-the-counter and prescription medicines only as told by your health care provider.  Do not use any products that contain nicotine or tobacco, such as cigarettes and e-cigarettes. If you need help quitting, ask your health care provider.  Keep all follow-up visits as told by your health care provider. This is important. Contact a health care provider if:  You are struggling to maintain a healthy diet or weight.  You need help to start on an exercise program.  You need help to stop smoking. Get help right away if:  You have chest pain.  You have trouble  breathing. This information is not intended to replace advice given to you by your health care provider. Make sure you discuss any questions you have with your health care provider. Document Revised: 10/14/2017 Document Reviewed: 04/10/2016 Elsevier Patient Education  [place].

## 2020-08-19 MED ORDER — ALPRAZOLAM 0.5 MG PO TABS: 0.5000 mg | ORAL_TABLET | Freq: Three times a day (TID) | ORAL | 0 refills | Status: DC | PRN

## 2020-08-19 MED ORDER — FUROSEMIDE 20 MG PO TABS
20.0000 mg | ORAL_TABLET | Freq: Every day | ORAL | 3 refills | Status: DC
Start: 2020-08-19 — End: 2021-03-27

## 2020-08-19 MED ORDER — HYDROCODONE-ACETAMINOPHEN 5-325 MG PO TABS
1.0000 | ORAL_TABLET | Freq: Three times a day (TID) | ORAL | 0 refills | Status: DC | PRN
Start: 2020-08-19 — End: 2020-09-17

## 2020-08-26 ENCOUNTER — Other Ambulatory Visit: Payer: Self-pay | Admitting: *Deleted

## 2020-08-26 DIAGNOSIS — I10 Essential (primary) hypertension: Secondary | ICD-10-CM

## 2020-08-26 DIAGNOSIS — E7800 Pure hypercholesterolemia, unspecified: Secondary | ICD-10-CM

## 2020-08-26 DIAGNOSIS — R972 Elevated prostate specific antigen [PSA]: Secondary | ICD-10-CM

## 2020-08-26 DIAGNOSIS — N183 Chronic kidney disease, stage 3 unspecified: Secondary | ICD-10-CM

## 2020-08-26 DIAGNOSIS — M1000 Idiopathic gout, unspecified site: Secondary | ICD-10-CM

## 2020-08-26 DIAGNOSIS — F411 Generalized anxiety disorder: Secondary | ICD-10-CM

## 2020-08-26 DIAGNOSIS — Z9861 Coronary angioplasty status: Secondary | ICD-10-CM

## 2020-08-29 ENCOUNTER — Ambulatory Visit (INDEPENDENT_AMBULATORY_CARE_PROVIDER_SITE_OTHER): Payer: PPO | Admitting: Family Medicine

## 2020-08-29 ENCOUNTER — Other Ambulatory Visit: Payer: Self-pay

## 2020-08-29 VITALS — BP 160/80 | HR 89 | Temp 97.8°F | Ht 72.0 in | Wt 220.0 lb

## 2020-08-29 DIAGNOSIS — N1832 Chronic kidney disease, stage 3b: Secondary | ICD-10-CM

## 2020-08-29 DIAGNOSIS — Z85528 Personal history of other malignant neoplasm of kidney: Secondary | ICD-10-CM

## 2020-08-29 DIAGNOSIS — I10 Essential (primary) hypertension: Secondary | ICD-10-CM | POA: Diagnosis not present

## 2020-08-29 DIAGNOSIS — E7800 Pure hypercholesterolemia, unspecified: Secondary | ICD-10-CM | POA: Diagnosis not present

## 2020-08-29 DIAGNOSIS — Z9861 Coronary angioplasty status: Secondary | ICD-10-CM

## 2020-08-29 DIAGNOSIS — R109 Unspecified abdominal pain: Secondary | ICD-10-CM

## 2020-08-29 MED ORDER — PREDNISONE 20 MG PO TABS: ORAL_TABLET | ORAL | 0 refills | Status: DC

## 2020-08-29 NOTE — Progress Notes (Signed)
Subjective:    Patient ID: Brandon Buck, male    DOB: Oct 02, 1961, 59 y.o.   MRN: [id number]  01/2020 Patient has not been seen in almost a year.  Patient has a history of CAD with catheterization in 2009 requiring stent in the L circumflex and RCA.  Also has a histoyr of stage III CKD with gout.   Patient states that his nephrologist recently resume lisinopril 10 mg a day due to proteinuria.  They also increase his Lasix to 20 mg twice a day due to hyperkalemia.  I would like to recheck his potassium as well as his renal function.  He states since he has been taking allopurinol he has not had a gout flare in over a year.  He still has chronic dyspnea on exertion however he denies any orthopnea or paroxysmal nocturnal dyspnea.  He denies any angina.  He denies any leg swelling.  He is on chronic Xanax and hydrocodone.  He has chronic hip pain due to avascular necrosis of the left hip.  Given his chronic kidney disease he is unable to take NSAIDs and therefore we will give the patient opiates for this.  He also uses Xanax sparingly for anxiety.  There is no evidence of abuse or diversion.  Patient is also overdue for prostate cancer screening as well as colon cancer screening.  He refuses a colonoscopy but he will consent to Cologuard.  He receives his second Covid vaccination later this month.  08/29/20 When I last saw the patient, his PSA was elevated at greater than 5.  Therefore we referred the patient to urology.  3 out of 12 core biopsies returned positive for cancer including 1 that was Gleason 7.  Therefore he is currently undergoing evaluation to determine the best method for treatment.  He prefers radical prostatectomy however he is also discussing with the radiation oncologist radioactive seed placement.  This is in the midst of being worked up.  Past medical history significant for renal cell carcinoma status post radical right nephrectomy.  Exactly below his scar on the right side which follows  the inferior margin of the rib, the patient is having shooting lancing pain radiating from his mid axillary line to the right lower quadrant.  He states that the pain is severe.  Is exacerbated by movement.  It is a lightening-like electrical pain.  Its intense.  Is hard for him to stand up from a seated position or to sit up from a lying position.  He states that the pain has been present now for more than 3 weeks.  He denies any fevers or chills or nausea or vomiting or melena or hematochezia.  See the diagram below for location Past Medical History:  Diagnosis Date  . Anxiety   . Arthritis   . Cancer [facility])    right renal cancer, right radical nephrectomy 2004  . Chronic back pain   . Chronic hip pain, left   . CKD (chronic kidney disease) stage 3, GFR 30-59 ml/min (HCC)   . Depression   . GERD (gastroesophageal reflux disease)   . Gout   . Heart disease   . High blood pressure   . History of PTCA 2    2 stents  . History of stomach ulcers   . Hypercholesteremia   . Hypertension   . Kidney disease   . Prostate cancer ([place])    3/12 cores upt to 40% grade 2 cancer. Planning brachytherapy (2021)  . Proteinuria   .  Renal insufficiency   . Tennis elbow    Past Surgical History:  Procedure Laterality Date  . Heart catherization    . HERNIA REPAIR    . HIP SURGERY    . Kidney removed     Current Outpatient Medications on File Prior to Visit  Medication Sig Dispense Refill  . allopurinol (ZYLOPRIM) 100 MG tablet TAKE 2 TABLETS BY MOUTH DAILY. 60 tablet 0  . ALPRAZolam (XANAX) 0.5 MG tablet Take 1 tablet (0.5 mg total) by mouth 3 (three) times daily as needed. for anxiety 30 tablet 0  . aspirin 81 MG tablet Take 81 mg by mouth daily.    Marland Kitchen atorvastatin (LIPITOR) 80 MG tablet TAKE (1) TABLET BY MOUTH ONCE DAILY. 90 tablet 1  . cholecalciferol (VITAMIN D3) 25 MCG (1000 UNIT) tablet Take 2,000 Units by mouth daily.    Marland Kitchen diltiazem (CARDIZEM CD) 240 MG 24 hr capsule TAKE (1) CAPSULE BY  MOUTH DAILY. 90 capsule 2  . ezetimibe (ZETIA) 10 MG tablet TAKE ONE TABLET BY MOUTH ONCE DAILY. 30 tablet 5  . fluticasone (FLONASE) 50 MCG/ACT nasal spray Place 2 sprays into both nostrils daily. 16 g 6  . furosemide (LASIX) 20 MG tablet Take 1 tablet (20 mg total) by mouth daily. 30 tablet 3  . HYDROcodone-acetaminophen (NORCO/VICODIN) 5-325 MG tablet Take 1 tablet by mouth 3 (three) times daily as needed for moderate pain. 90 tablet 0  . lisinopril (ZESTRIL) 20 MG tablet Take 10 mg by mouth daily.    . metoprolol tartrate (LOPRESSOR) 50 MG tablet TAKE ONE HALF TABLET BY MOUTH TWICE DAILY. 90 tablet 3  . NICOTROL 10 MG inhaler INHALE 1 CARTRIDGE INTO THE LUNGS AS NEEDED FOR SMOKING CESSATION. 168 each 0  . nitroGLYCERIN (NITROSTAT) 0.4 MG SL tablet Place 0.4 mg under the tongue every 5 (five) minutes as needed.    . pantoprazole (PROTONIX) 40 MG tablet Take 1 tablet (40 mg total) by mouth 2 (two) times daily. 60 tablet 5  . ALPRAZolam (XANAX) 0.5 MG tablet Take 1 tablet (0.5 mg total) by mouth 3 (three) times daily as needed. for anxiety (Patient not taking: Reported on 08/18/2020) 30 tablet 0  . cyclobenzaprine (FLEXERIL) 5 MG tablet Take 1 tablet (5 mg total) by mouth 3 (three) times daily as needed. (Patient not taking: Reported on 08/18/2020) 30 tablet 0  . ezetimibe (ZETIA) 10 MG tablet Take 10 mg by mouth daily. (Patient not taking: Reported on 08/18/2020)    . famotidine (PEPCID) 20 MG tablet Take 1 tablet (20 mg total) by mouth 2 (two) times daily. (Patient not taking: Reported on 08/18/2020) 20 tablet 0   No current facility-administered medications on file prior to visit.    Allergies  Allergen Reactions  . Other     Pt. Reports having seizures after anesthesia once.  Marland Kitchen Keflex [Cephalexin] Rash   Social History   Socioeconomic History  . Marital status: Married    Spouse name: Not on file  . Number of children: Not on file  . Years of education: Not on file  . Highest  education level: Not on file  Occupational History  . Occupation: retired  Tobacco Use  . Smoking status: Former Smoker    Packs/day: 1.50    Years: 32.00    Pack years: 48.00    Types: Cigarettes    Start date: 01/26/1971    Quit date: 01/26/2003    Years since quitting: 17.6  . Smokeless tobacco: Never Used  Vaping Use  . Vaping Use: Never used  Substance and Sexual Activity  . Alcohol use: Yes    Alcohol/week: 0.0 standard drinks    Comment: occ  . Drug use: No  . Sexual activity: Not on file  Other Topics Concern  . Not on file  Social History Narrative  . Not on file   Social Determinants of Health   Financial Resource Strain: Low Risk   . Difficulty of Paying Living Expenses: Not very hard  Food Insecurity:   . Worried About Charity fundraiser in the Last Year: Not on file  . Ran Out of Food in the Last Year: Not on file  Transportation Needs:   . Lack of Transportation (Medical): Not on file  . Lack of Transportation (Non-Medical): Not on file  Physical Activity:   . Days of Exercise per Week: Not on file  . Minutes of Exercise per Session: Not on file  Stress:   . Feeling of Stress : Not on file  Social Connections:   . Frequency of Communication with Friends and Family: Not on file  . Frequency of Social Gatherings with Friends and Family: Not on file  . Attends Religious Services: Not on file  . Active Member of Clubs or Organizations: Not on file  . Attends Archivist Meetings: Not on file  . Marital Status: Not on file  Intimate Partner Violence:   . Fear of Current or Ex-Partner: Not on file  . Emotionally Abused: Not on file  . Physically Abused: Not on file  . Sexually Abused: Not on file      Review of Systems  All other systems reviewed and are negative.      Objective:   Physical Exam Vitals reviewed.  Constitutional:      General: He is not in acute distress.    Appearance: He is well-developed. He is not diaphoretic.   HENT:     Right Ear: External ear normal.     Left Ear: External ear normal.     Nose: Nose normal.     Mouth/Throat:     Pharynx: No oropharyngeal exudate.  Eyes:     General: No scleral icterus.    Conjunctiva/sclera: Conjunctivae normal.  Cardiovascular:     Rate and Rhythm: Normal rate and regular rhythm.     Heart sounds: Normal heart sounds.  Pulmonary:     Effort: Pulmonary effort is normal. No respiratory distress.     Breath sounds: Normal breath sounds. No wheezing or rales.  Abdominal:     General: Bowel sounds are normal. There is no distension.     Palpations: Abdomen is soft.     Tenderness: There is no abdominal tenderness. There is no rebound.     Hernia: There is no hernia in the left inguinal area.    Genitourinary:    Penis: Normal.      Testes: Normal.        Right: Mass or tenderness not present.        Left: Mass or tenderness not present.  Musculoskeletal:     Cervical back: Neck supple.  Lymphadenopathy:     Cervical: No cervical adenopathy.           Assessment & Plan:  Pure hypercholesterolemia - Plan: CBC with Differential/Platelet, COMPLETE METABOLIC PANEL WITH GFR, Lipid panel  Benign essential HTN - Plan: CBC with Differential/Platelet, COMPLETE METABOLIC PANEL WITH GFR, Lipid panel  History of PTCA 2 - Plan: CBC  with Differential/Platelet, COMPLETE METABOLIC PANEL WITH GFR, Lipid panel  Stage 3b chronic kidney disease ([place]) - Plan: CBC with Differential/Platelet, COMPLETE METABOLIC PANEL WITH GFR, Lipid panel, CT Abdomen Pelvis Wo Contrast  Personal history of renal cell cancer - Plan: CT Abdomen Pelvis Wo Contrast  Radiating abdominal pain - Plan: CT Abdomen Pelvis Wo Contrast  Patient's blood pressure is very high however he is extremely anxious.  He is worried because he has worsening abdominal pain.  He is concerned that this may be a sign of a spread of his prostate cancer or recurrence of his renal cell carcinoma.  I believe  more likely that the pain is either due to adhesions from his previous surgical scar or possibly even thoracic radiculopathy.  I will treat the patient with a prednisone taper pack, inflammation from either adhesions or possibly thoracic radiculopathy.  He is unable to take NSAIDs due to his chronic kidney disease.  Meanwhile I will schedule the patient for CT scan of the abdomen and pelvis without contrast to determine if there is another underlying source for his abdominal pain.  Check CBC, CMP, fasting lipid panel.  Monitor renal function to ensure its not worsening.  I would like his LDL cholesterol to be below 70.  Blood pressure is extremely high however the patient is also very anxious as he is still worried about treating his prostate cancer and now is concerned about the source of his abdominal pain and possible cancer spread.  I would like to recheck the blood pressure next week and at home when he is more relaxed.  If consistently greater than 140/90 it would require additional medication.

## 2020-08-30 LAB — CBC WITH DIFFERENTIAL/PLATELET
Absolute Monocytes: 468 cells/uL (ref 200–950)
Basophils Absolute: 48 cells/uL (ref 0–200)
Basophils Relative: 0.8 %
Eosinophils Absolute: 162 cells/uL (ref 15–500)
Eosinophils Relative: 2.7 %
HCT: 43.7 % (ref 38.5–50.0)
Hemoglobin: 14.7 g/dL (ref 13.2–17.1)
Lymphs Abs: 1368 cells/uL (ref 850–3900)
MCH: 30.8 pg (ref 27.0–33.0)
MCHC: 33.6 g/dL (ref 32.0–36.0)
MCV: 91.6 fL (ref 80.0–100.0)
MPV: 10.2 fL (ref 7.5–12.5)
Monocytes Relative: 7.8 %
Neutro Abs: 3954 cells/uL (ref 1500–7800)
Neutrophils Relative %: 65.9 %
Platelets: 202 10*3/uL (ref 140–400)
RBC: 4.77 10*6/uL (ref 4.20–5.80)
RDW: 13 % (ref 11.0–15.0)
Total Lymphocyte: 22.8 %
WBC: 6 10*3/uL (ref 3.8–10.8)

## 2020-08-30 LAB — COMPLETE METABOLIC PANEL WITH GFR
AG Ratio: 1.8 (calc) (ref 1.0–2.5)
ALT: 23 U/L (ref 9–46)
AST: 23 U/L (ref 10–35)
Albumin: 4.4 g/dL (ref 3.6–5.1)
Alkaline phosphatase (APISO): 93 U/L (ref 35–144)
BUN/Creatinine Ratio: 9 (calc) (ref 6–22)
BUN: 16 mg/dL (ref 7–25)
CO2: 26 mmol/L (ref 20–32)
Calcium: 9.2 mg/dL (ref 8.6–10.3)
Chloride: 103 mmol/L (ref 98–110)
Creat: 1.76 mg/dL — ABNORMAL HIGH (ref 0.70–1.25)
GFR, Est African American: 48 mL/min/{1.73_m2} — ABNORMAL LOW (ref >=60)
GFR, Est Non African American: 41 mL/min/{1.73_m2} — ABNORMAL LOW (ref >=60)
Globulin: 2.4 g/dL (calc) (ref 1.9–3.7)
Glucose, Bld: 94 mg/dL (ref 65–99)
Potassium: 4.6 mmol/L (ref 3.5–5.3)
Sodium: 140 mmol/L (ref 135–146)
Total Bilirubin: 0.6 mg/dL (ref 0.2–1.2)
Total Protein: 6.8 g/dL (ref 6.1–8.1)

## 2020-08-30 LAB — LIPID PANEL
Cholesterol: 116 mg/dL (ref <200)
HDL: 42 mg/dL (ref >=40)
LDL Cholesterol (Calc): 57 mg/dL (calc)
Non-HDL Cholesterol (Calc): 74 mg/dL (calc) (ref <130)
Total CHOL/HDL Ratio: 2.8 (calc) (ref <5.0)
Triglycerides: 83 mg/dL (ref <150)

## 2020-09-01 ENCOUNTER — Encounter: Payer: Self-pay | Admitting: Radiation Oncology

## 2020-09-01 NOTE — Progress Notes (Signed)
GU Location of Tumor / Histology: prostatic adenocarcinoma  If Prostate Cancer, Gleason Score is (3 + 4) and PSA is (5.3). Prostate volume: 35  Brandon Buck was originally diagnosed by Dr. Diona Fanti in [place]. Hx of right renal cancer s/p open radical nephrectomy in 2004 by Dr. Rosana Hoes.  Biopsies of prostate (if applicable) revealed:    Past/Anticipated interventions by urology, if any: nephrectomy, prostate biopsy, referral back to Dr. Tammi Klippel to rediscuss brachytherapy, CT abd pelvis scheduled for 09/09/20 at 7pm to further evaluate low back pain.  Past/Anticipated interventions by medical oncology, if any: no  Weight changes, if any: suffers from obesity making him an OK surgical candidate. Scar tissue from nephrectomy creates potential complications with a prostatectomy.  Bowel/Bladder complaints, if any: Reports nocturia, leakage and intermittent urine stream.    Nausea/Vomiting, if any: no  Pain issues, if any:  Constant right low back pain that radiates around his right side. Started prednisone today. Scheduled for CT on 09/09/20.   SAFETY ISSUES:  Prior radiation? denies  Pacemaker/ICD? denies  Possible current pregnancy? no, male patient  Is the patient on methotrexate? denies  Current Complaints / other details:  59 year old male. Married. No children. Resides in [place].   Patient reports he is leaning toward surgery but "doesn't feel like they want to operate." Mother with hx of kidney ca. Father and uncle with hx of prostate ca.

## 2020-09-02 ENCOUNTER — Ambulatory Visit
Admission: RE | Admit: 2020-09-02 | Discharge: 2020-09-02 | Disposition: A | Discharge status: Home / Self Care | Payer: PPO | Source: Ambulatory Visit | Attending: Radiation Oncology | Admitting: Radiation Oncology

## 2020-09-02 ENCOUNTER — Other Ambulatory Visit: Payer: Self-pay

## 2020-09-02 ENCOUNTER — Encounter: Payer: Self-pay | Admitting: Medical Oncology

## 2020-09-02 ENCOUNTER — Encounter: Payer: Self-pay | Admitting: Radiation Oncology

## 2020-09-02 ENCOUNTER — Other Ambulatory Visit: Payer: Self-pay | Admitting: Family Medicine

## 2020-09-02 VITALS — BP 182/97 | HR 106 | Temp 97.7°F | Resp 20 | Ht 72.0 in | Wt 222.8 lb

## 2020-09-02 DIAGNOSIS — K219 Gastro-esophageal reflux disease without esophagitis: Secondary | ICD-10-CM | POA: Diagnosis not present

## 2020-09-02 DIAGNOSIS — Z87891 Personal history of nicotine dependence: Secondary | ICD-10-CM | POA: Diagnosis not present

## 2020-09-02 DIAGNOSIS — E7800 Pure hypercholesterolemia, unspecified: Secondary | ICD-10-CM | POA: Diagnosis not present

## 2020-09-02 DIAGNOSIS — I129 Hypertensive chronic kidney disease with stage 1 through stage 4 chronic kidney disease, or unspecified chronic kidney disease: Secondary | ICD-10-CM | POA: Diagnosis not present

## 2020-09-02 DIAGNOSIS — N183 Chronic kidney disease, stage 3 unspecified: Secondary | ICD-10-CM | POA: Diagnosis not present

## 2020-09-02 DIAGNOSIS — Z905 Acquired absence of kidney: Secondary | ICD-10-CM | POA: Diagnosis not present

## 2020-09-02 DIAGNOSIS — Z85528 Personal history of other malignant neoplasm of kidney: Secondary | ICD-10-CM | POA: Diagnosis not present

## 2020-09-02 DIAGNOSIS — Z79899 Other long term (current) drug therapy: Secondary | ICD-10-CM | POA: Insufficient documentation

## 2020-09-02 DIAGNOSIS — C61 Malignant neoplasm of prostate: Secondary | ICD-10-CM | POA: Insufficient documentation

## 2020-09-02 DIAGNOSIS — Z7982 Long term (current) use of aspirin: Secondary | ICD-10-CM | POA: Diagnosis not present

## 2020-09-02 DIAGNOSIS — F419 Anxiety disorder, unspecified: Secondary | ICD-10-CM | POA: Insufficient documentation

## 2020-09-02 DIAGNOSIS — R972 Elevated prostate specific antigen [PSA]: Secondary | ICD-10-CM | POA: Diagnosis not present

## 2020-09-02 NOTE — Progress Notes (Signed)
[facility] to cancel appointment with Dr. Tresa Moore per patient's request. Brandon Buck has decided to move forward with brachytherapy.

## 2020-09-02 NOTE — Progress Notes (Signed)
Radiation Oncology         [phone] ________________________________  Initial outpatient Consultation  Name: Brandon Buck MRN: [id number]  Date: 09/02/2020  DOB: 1961/06/07  JK:DTOIZTI, [name], MD  Alexis Frock, MD   REFERRING PHYSICIAN: Alexis Frock, MD  DIAGNOSIS: 59 y.o. gentleman with Stage T1c adenocarcinoma of the prostate with Gleason score of 3+4, and PSA of 5.3.    ICD-10-CM   1. Malignant neoplasm of prostate ([place])  C61     HISTORY OF PRESENT ILLNESS: Brandon Buck is a 59 y.o. male with a diagnosis of prostate cancer. He also has a history of renal cell carcinoma, s/p right radical nephrectomy 04/2002. He was noted to have an elevated PSA of 5.3 by his primary care physician, Dr. Dennard Schaumann.  Accordingly, he was referred for evaluation in urology by Dr. Diona Fanti on 04/22/2020,  digital rectal examination was performed at that time revealing no nodules.  The patient proceeded to transrectal ultrasound with 12 biopsies of the prostate on 05/20/2020.  The prostate volume measured 34.7 cc.  Out of 12 core biopsies, 3 were positive.  The maximum Gleason score was 3+4, and this was seen in right apex.  Additionally, Gleason 3+3 was seen in right mid lateral and left apex lateral.  He met with Dr. Tresa Moore on 07/31/2020. Per his note, the patient is felt to be a very good brachytherapy candidate, less so a surgical candidate given his comorbidities. He is also scheduled for CT A/P on 09/09/2020.  The patient reviewed the biopsy results with his urologist and he has kindly been referred today for discussion of potential radiation treatment options.   PREVIOUS RADIATION THERAPY: No  PAST MEDICAL HISTORY:  Past Medical History:  Diagnosis Date  . Anxiety   . Arthritis   . Cancer [facility])    right renal cancer, right radical nephrectomy 2004  . Chronic back pain   . Chronic hip pain, left   . CKD (chronic kidney disease) stage 3, GFR 30-59 ml/min (HCC)   . Depression   .  GERD (gastroesophageal reflux disease)   . Gout   . Heart disease   . High blood pressure   . History of PTCA 2    2 stents  . History of stomach ulcers   . Hypercholesteremia   . Hypertension   . Kidney disease   . Prostate cancer ([place])    3/12 cores upt to 40% grade 2 cancer. Planning brachytherapy (2021)  . Proteinuria   . Renal insufficiency   . Tennis elbow       PAST SURGICAL HISTORY: Past Surgical History:  Procedure Laterality Date  . Heart catherization    . HERNIA REPAIR    . HIP SURGERY    . Kidney removed      FAMILY HISTORY:  Family History  Problem Relation Age of Onset  . Heart disease Other   . Cancer Other   . Kidney disease Other   . Heart disease Father   . Heart disease Mother   . Breast cancer Neg Hx   . Prostate cancer Neg Hx   . Colon cancer Neg Hx   . Pancreatic cancer Neg Hx     SOCIAL HISTORY:  Social History   Socioeconomic History  . Marital status: Married    Spouse name: Not on file  . Number of children: Not on file  . Years of education: Not on file  . Highest education level: Not on file  Occupational History  .  Occupation: retired  Tobacco Use  . Smoking status: Former Smoker    Packs/day: 1.50    Years: 32.00    Pack years: 48.00    Types: Cigarettes    Start date: 01/26/1971    Quit date: 01/26/2003    Years since quitting: 17.6  . Smokeless tobacco: Never Used  Vaping Use  . Vaping Use: Never used  Substance and Sexual Activity  . Alcohol use: Yes    Alcohol/week: 0.0 standard drinks    Comment: occ  . Drug use: No  . Sexual activity: Yes  Other Topics Concern  . Not on file  Social History Narrative  . Not on file   Social Determinants of Health   Financial Resource Strain: Low Risk   . Difficulty of Paying Living Expenses: Not very hard  Food Insecurity:   . Worried About Charity fundraiser in the Last Year: Not on file  . Ran Out of Food in the Last Year: Not on file  Transportation Needs:   . Lack  of Transportation (Medical): Not on file  . Lack of Transportation (Non-Medical): Not on file  Physical Activity:   . Days of Exercise per Week: Not on file  . Minutes of Exercise per Session: Not on file  Stress:   . Feeling of Stress : Not on file  Social Connections:   . Frequency of Communication with Friends and Family: Not on file  . Frequency of Social Gatherings with Friends and Family: Not on file  . Attends Religious Services: Not on file  . Active Member of Clubs or Organizations: Not on file  . Attends Archivist Meetings: Not on file  . Marital Status: Not on file  Intimate Partner Violence:   . Fear of Current or Ex-Partner: Not on file  . Emotionally Abused: Not on file  . Physically Abused: Not on file  . Sexually Abused: Not on file    ALLERGIES: Other and Keflex [cephalexin]  MEDICATIONS:  Current Outpatient Medications  Medication Sig Dispense Refill  . allopurinol (ZYLOPRIM) 100 MG tablet TAKE 2 TABLETS BY MOUTH DAILY. 60 tablet 0  . ALPRAZolam (XANAX) 0.5 MG tablet Take 1 tablet (0.5 mg total) by mouth 3 (three) times daily as needed. for anxiety (Patient not taking: Reported on 08/18/2020) 30 tablet 0  . ALPRAZolam (XANAX) 0.5 MG tablet Take 1 tablet (0.5 mg total) by mouth 3 (three) times daily as needed. for anxiety 30 tablet 0  . aspirin 81 MG tablet Take 81 mg by mouth daily.    Marland Kitchen atorvastatin (LIPITOR) 80 MG tablet TAKE (1) TABLET BY MOUTH ONCE DAILY. 90 tablet 1  . cholecalciferol (VITAMIN D3) 25 MCG (1000 UNIT) tablet Take 2,000 Units by mouth daily.    . cyclobenzaprine (FLEXERIL) 5 MG tablet Take 1 tablet (5 mg total) by mouth 3 (three) times daily as needed. (Patient not taking: Reported on 08/18/2020) 30 tablet 0  . diltiazem (CARDIZEM CD) 240 MG 24 hr capsule TAKE (1) CAPSULE BY MOUTH DAILY. 90 capsule 2  . ezetimibe (ZETIA) 10 MG tablet TAKE ONE TABLET BY MOUTH ONCE DAILY. 30 tablet 5  . ezetimibe (ZETIA) 10 MG tablet Take 10 mg by mouth  daily. (Patient not taking: Reported on 08/18/2020)    . famotidine (PEPCID) 20 MG tablet Take 1 tablet (20 mg total) by mouth 2 (two) times daily. (Patient not taking: Reported on 08/18/2020) 20 tablet 0  . fluticasone (FLONASE) 50 MCG/ACT nasal spray Place 2  sprays into both nostrils daily. 16 g 6  . furosemide (LASIX) 20 MG tablet Take 1 tablet (20 mg total) by mouth daily. 30 tablet 3  . HYDROcodone-acetaminophen (NORCO/VICODIN) 5-325 MG tablet Take 1 tablet by mouth 3 (three) times daily as needed for moderate pain. 90 tablet 0  . lisinopril (ZESTRIL) 20 MG tablet Take 10 mg by mouth daily.    . metoprolol tartrate (LOPRESSOR) 50 MG tablet TAKE ONE HALF TABLET BY MOUTH TWICE DAILY. 90 tablet 3  . NICOTROL 10 MG inhaler INHALE 1 CARTRIDGE INTO THE LUNGS AS NEEDED FOR SMOKING CESSATION. 168 each 0  . nitroGLYCERIN (NITROSTAT) 0.4 MG SL tablet Place 0.4 mg under the tongue every 5 (five) minutes as needed.    . pantoprazole (PROTONIX) 40 MG tablet Take 1 tablet (40 mg total) by mouth 2 (two) times daily. 60 tablet 5  . predniSONE (DELTASONE) 20 MG tablet 3 tabs poqday 1-2, 2 tabs poqday 3-4, 1 tab poqday 5-6 12 tablet 0   No current facility-administered medications for this encounter.    REVIEW OF SYSTEMS:  On review of systems, the patient reports that he is doing well overall. He denies any chest pain, shortness of breath, cough, fevers, chills, night sweats, unintended weight changes. He denies any bowel disturbances, and denies abdominal pain, nausea or vomiting. He denies any new musculoskeletal or joint aches or pains. His IPSS was 10, indicating moderate urinary symptoms. His SHIM was 14, indicating he moderate erectile dysfunction. A complete review of systems is obtained and is otherwise negative.    PHYSICAL EXAM:  Wt Readings from Last 3 Encounters:  08/29/20 220 lb (99.8 kg)  06/19/20 220 lb (99.8 kg)  04/22/20 221 lb (100.2 kg)   Temp Readings from Last 3 Encounters:    08/29/20 97.8 F (36.6 C) (Oral)  06/19/20 98 F (36.7 C) (Oral)  05/20/20 98.4 F (36.9 C) (Oral)   BP Readings from Last 3 Encounters:  08/29/20 (!) 160/80  06/19/20 (!) 164/78  05/20/20 (!) 167/84   Pulse Readings from Last 3 Encounters:  08/29/20 89  06/19/20 74  05/20/20 72    /10  In general this is a well appearing man in no acute distress. He's alert and oriented x4 and appropriate throughout the examination. Cardiopulmonary assessment is negative for acute distress and he exhibits normal effort.    KPS = 100  100 - Normal; no complaints; no evidence of disease. 90   - Able to carry on normal activity; minor signs or symptoms of disease. 80   - Normal activity with effort; some signs or symptoms of disease. 32   - Cares for self; unable to carry on normal activity or to do active work. 60   - Requires occasional assistance, but is able to care for most of his personal needs. 50   - Requires considerable assistance and frequent medical care. 32   - Disabled; requires special care and assistance. 42   - Severely disabled; hospital admission is indicated although death not imminent. 75   - Very sick; hospital admission necessary; active supportive treatment necessary. 10   - Moribund; fatal processes progressing rapidly. 0     - Dead  Karnofsky DA, Abelmann WH, Craver LS and Burchenal [facility] [phone]) The use of the nitrogen mustards in the palliative treatment of carcinoma: with particular reference to bronchogenic carcinoma Cancer 1 634-56  LABORATORY DATA:  Lab Results  Component Value Date   WBC 6.0 08/29/2020   HGB 14.7  08/29/2020   HCT 43.7 08/29/2020   MCV 91.6 08/29/2020   PLT 202 08/29/2020   Lab Results  Component Value Date   NA 140 08/29/2020   K 4.6 08/29/2020   CL 103 08/29/2020   CO2 26 08/29/2020   Lab Results  Component Value Date   ALT 23 08/29/2020   AST 23 08/29/2020   ALKPHOS 63 03/31/2017   BILITOT 0.6 08/29/2020     RADIOGRAPHY: No  results found.    IMPRESSION/PLAN: 1. 59 y.o. gentleman with Stage T1c adenocarcinoma of the prostate with Gleason Score of 3+4, and PSA of 5.3. We discussed the patient's workup and outlined the nature of prostate cancer in this setting. The patient's T stage, Gleason's score, and PSA put him into the favorable intermediate risk group. Accordingly, he is eligible for a variety of potential treatment options including brachytherapy, 5.5 weeks of external radiation, or prostatectomy. We discussed the available radiation techniques, and focused on the details and logistics of delivery. We discussed and outlined the risks, benefits, short and long-term effects associated with radiotherapy and compared and contrasted these with prostatectomy. We discussed the role of SpaceOAR gel in reducing the rectal toxicity associated with radiotherapy.  He appears to have a good understanding of his disease and our treatment recommendations which are of curative intent.  He was encouraged to ask questions that were answered to his stated satisfaction.  At the conclusion of our conversation, the patient is interested in moving forward with seed implant in December.  I spent 60 minutes in the encounter total.   ------------------------------------------------   Tyler Pita, MD [place] Director and Director of Stereotactic Radiosurgery Direct Dial: [phone]  Fax: [phone] [email or web address]  Skype  [email or web address]   This document serves as a record of services personally performed by Tyler Pita, MD. It was created on his behalf by Wilburn Mylar, a trained medical scribe. The creation of this record is based on the scribe's personal observations and the provider's statements to them. This document has been checked and approved by the attending provider.

## 2020-09-02 NOTE — Progress Notes (Signed)
Introduced myself to Brandon Buck and his wife, as the prostate nurse navigator and discussed my role. He states he was set on surgery before consulting with Brandon Buck, but would like brachytherapy. He had kidney cancer in 2004 and has lots of scar tissue which may complicate surgery.  He has an appointment with Brandon Buck, 12/6 to discuss surgery but request it be cancelled. I will cancel appointment. I discussed the next steps in planning for seed implant and he was able to meet [facility]. No barriers to care identified at this time. I gave him my business card and asked him to call me with questions or concerns. He voiced understanding.

## 2020-09-02 NOTE — Addendum Note (Signed)
Encounter addended by: Heywood Footman, RN on: 09/02/2020 4:24 PM  Actions taken: Flowsheet accepted, Actions taken from a BestPractice Advisory, Order list changed, Diagnosis association updated

## 2020-09-03 ENCOUNTER — Encounter: Payer: Self-pay | Admitting: Licensed Clinical Social Worker

## 2020-09-03 NOTE — Progress Notes (Signed)
[facility] Psychosocial Distress Screening Clinical Social Work  Clinical Social Work was referred by distress screening protocol.  The patient scored a 9 on the Psychosocial Distress Thermometer which indicates severe distress. Clinical Social Worker attempted to contact patient by phone to assess for distress and other psychosocial needs.   No answer. Left VM with direct contact information.  ONCBCN DISTRESS SCREENING 09/02/2020  Screening Type Initial Screening  Distress experienced in past week (1-10) 9  Emotional problem type Depression;Nervousness/Anxiety;Adjusting to illness  Physical Problem type Pain;Getting around;Bathing/dressing;Swollen arms/legs  Physician notified of physical symptoms Yes  Referral to clinical psychology No  Referral to clinical social work Yes  Referral to dietition No  Referral to financial advocate No  Referral to support programs Yes  Referral to palliative care No  Other preferred method of contact [place], LCSW

## 2020-09-05 ENCOUNTER — Telehealth: Payer: Self-pay | Admitting: *Deleted

## 2020-09-05 NOTE — Progress Notes (Signed)
[place] Work  Received TC back from patient regarding distress screen.  Patient reports feeling better about treatment plan and ready to get started after meeting with radiation oncology team. He has good support from his wife and family. No concerns currently with transportation, food, or finances.  CSW and patient discussed common feeling and emotions when being diagnosed with cancer, and the importance of support during treatment.  CSW informed patient of the support team and support services at [facility].  CSW provided contact information and encouraged patient to call with any questions or concerns.   Christeen Douglas, LCSW

## 2020-09-05 NOTE — Telephone Encounter (Signed)
CALLED PATIENT TO UPDATE, LVM FOR A RETURN CALL 

## 2020-09-09 ENCOUNTER — Other Ambulatory Visit: Payer: Self-pay

## 2020-09-09 ENCOUNTER — Ambulatory Visit (HOSPITAL_COMMUNITY)
Admission: RE | Admit: 2020-09-09 | Discharge: 2020-09-09 | Disposition: A | Discharge status: Home / Self Care | Payer: PPO | Source: Ambulatory Visit | Attending: Family Medicine | Admitting: Family Medicine

## 2020-09-09 DIAGNOSIS — Z85528 Personal history of other malignant neoplasm of kidney: Secondary | ICD-10-CM

## 2020-09-09 DIAGNOSIS — C61 Malignant neoplasm of prostate: Secondary | ICD-10-CM | POA: Diagnosis not present

## 2020-09-09 DIAGNOSIS — R109 Unspecified abdominal pain: Secondary | ICD-10-CM | POA: Insufficient documentation

## 2020-09-09 DIAGNOSIS — Q600 Renal agenesis, unilateral: Secondary | ICD-10-CM | POA: Diagnosis not present

## 2020-09-09 DIAGNOSIS — K5730 Diverticulosis of large intestine without perforation or abscess without bleeding: Secondary | ICD-10-CM | POA: Diagnosis not present

## 2020-09-09 DIAGNOSIS — N1832 Chronic kidney disease, stage 3b: Secondary | ICD-10-CM | POA: Insufficient documentation

## 2020-09-09 DIAGNOSIS — K6389 Other specified diseases of intestine: Secondary | ICD-10-CM | POA: Diagnosis not present

## 2020-09-11 ENCOUNTER — Other Ambulatory Visit: Payer: Self-pay | Admitting: *Deleted

## 2020-09-11 ENCOUNTER — Telehealth: Payer: Self-pay | Admitting: *Deleted

## 2020-09-11 ENCOUNTER — Telehealth: Payer: Self-pay

## 2020-09-11 ENCOUNTER — Encounter: Payer: Self-pay | Admitting: *Deleted

## 2020-09-11 DIAGNOSIS — K6389 Other specified diseases of intestine: Secondary | ICD-10-CM

## 2020-09-11 NOTE — Telephone Encounter (Signed)
Please call-I looked @ CT--no, can't be related b/c the area in question is about a foot from area where bx was done

## 2020-09-11 NOTE — Telephone Encounter (Signed)
Called patient to inform of implant date, spoke with patient and he is aware of this date. 

## 2020-09-12 NOTE — Telephone Encounter (Signed)
Pt.notified

## 2020-09-16 ENCOUNTER — Encounter (INDEPENDENT_AMBULATORY_CARE_PROVIDER_SITE_OTHER): Payer: Self-pay | Admitting: *Deleted

## 2020-09-17 ENCOUNTER — Telehealth: Payer: Self-pay | Admitting: Radiation Oncology

## 2020-09-17 ENCOUNTER — Other Ambulatory Visit: Payer: Self-pay | Admitting: Family Medicine

## 2020-09-17 NOTE — Telephone Encounter (Signed)
I feel certain that Dr. Tammi Klippel will want the colonoscopy to be done prior to brachytherapy. Can you call the GI office to relay the importance of getting his colonoscopy ASAP in an effort to minimize delay in his prostate cancer treatment and see if they can expedite the colonoscopy, please? Otherwise, if he proceeds with brachytherapy now, he would need to wait a minimum of 3 months after brachytherapy prior to proceeding with colonoscopy and this could potentially delay treatment of a much more dangerous cancer if he is found to have colon cancer. -Veronica Guerrant

## 2020-09-17 NOTE — Telephone Encounter (Signed)
-----   Message from Freeman Caldron, [place] sent at 09/17/2020 11:27 AM EST ----- Regarding: FW: PHONE CALL Sam, Will you please reach out tho this patient to find out more about this "spot on his colon"? If he is scheduled for further workup and/or procedures related to the colon, we very well may need to push back the date of his brachytherapy pending how quickly they are able to assess the concern and/or whether he will need surgery/treatment if this is suspicious for colon cancer. His colon evaluation/treatment should definitely be completed prior to brachytherapy! Currently, he is scheduled for brachytherapy with Dr. Diona Fanti on 11/03/2020. Please keep [place] all in the loop regarding any need to postpone brachytherapy. Lia Foyer ----- Message ----- From: Kerri Perches Sent: 09/17/2020  10:43 AM EST To: Freeman Caldron, PA-C Subject: PHONE CALL                                     Good Morning Ashlyn,   This patient called this morning and he wants you to call him on Tuesday Nov. 30.  Mr. Calix went to see a doctor and they have found a spot on his colon, he wants to know if he still needs to come for his pre-seed appts.  Mr. Retter phone number is [phone] or [phone].  Thanks,  [employer]

## 2020-09-17 NOTE — Telephone Encounter (Signed)
Please Advise

## 2020-09-17 NOTE — Telephone Encounter (Signed)
Phoned patient as requested by Freeman Caldron, PA-C to inquire further about spot on colon. Patient explains that after his consultation with [place] he went to see his PCP about pain he had been having in his right side. Patient explains his PCP ordered a CT scan then told him everything looked fine except "a spot on his colon." Patient confirms the CT scan was done at [facility]. Patient goes onto explain his PCP referred him to a GI doctor he is supposed to see on February 7th in [place]. Patient verbalizes his understanding that a colonoscopy will be set up for the end of February.   Patient denies LUTS or any bowel complaints. Patient denies blood in stool.   Explained that his colon evaluation/treatment should be completed prior to brachytherapy. Patient verbalizes his desire "to get this prostate cancer taken care right away."  Explained this RN will relay this information to the providers and phone him back with directions/what to do about pre seed appointment and brachy appt with [facility].

## 2020-09-23 ENCOUNTER — Telehealth: Payer: Self-pay | Admitting: *Deleted

## 2020-09-23 NOTE — Telephone Encounter (Signed)
CALLED PATIENT TO UPDATE, SPOKE WITH PATIENT 

## 2020-09-24 ENCOUNTER — Other Ambulatory Visit: Payer: Self-pay | Admitting: Urology

## 2020-09-26 ENCOUNTER — Telehealth: Payer: Self-pay

## 2020-09-26 NOTE — Telephone Encounter (Signed)
Did the prednisone help.  I was treating for a possible pinched nerve in his back.  Dr. Laural Golden is going to evaluate his colon for any evidence of colon malignancy.

## 2020-09-26 NOTE — Telephone Encounter (Signed)
Prednisone did make it a little better for a few weeks, now pain has returned.

## 2020-09-26 NOTE — Telephone Encounter (Signed)
Brandon Buck called in concern what's causing his back and side pain? Patient has appointment with Brandon Buck 10-02-20 He has completed course of Prednisone.

## 2020-10-02 ENCOUNTER — Telehealth (INDEPENDENT_AMBULATORY_CARE_PROVIDER_SITE_OTHER): Payer: Self-pay

## 2020-10-02 ENCOUNTER — Encounter (INDEPENDENT_AMBULATORY_CARE_PROVIDER_SITE_OTHER): Payer: Self-pay

## 2020-10-02 ENCOUNTER — Other Ambulatory Visit: Payer: Self-pay

## 2020-10-02 ENCOUNTER — Other Ambulatory Visit: Payer: Self-pay | Admitting: Family Medicine

## 2020-10-02 ENCOUNTER — Ambulatory Visit (INDEPENDENT_AMBULATORY_CARE_PROVIDER_SITE_OTHER): Payer: PPO | Admitting: Gastroenterology

## 2020-10-02 ENCOUNTER — Other Ambulatory Visit (INDEPENDENT_AMBULATORY_CARE_PROVIDER_SITE_OTHER): Payer: Self-pay

## 2020-10-02 ENCOUNTER — Encounter (INDEPENDENT_AMBULATORY_CARE_PROVIDER_SITE_OTHER): Payer: Self-pay | Admitting: Gastroenterology

## 2020-10-02 DIAGNOSIS — Z1211 Encounter for screening for malignant neoplasm of colon: Secondary | ICD-10-CM

## 2020-10-02 DIAGNOSIS — R935 Abnormal findings on diagnostic imaging of other abdominal regions, including retroperitoneum: Secondary | ICD-10-CM | POA: Diagnosis not present

## 2020-10-02 DIAGNOSIS — M1000 Idiopathic gout, unspecified site: Secondary | ICD-10-CM

## 2020-10-02 MED ORDER — PLENVU 140 G PO SOLR: 280.0000 g | Freq: Once | ORAL | 0 refills | Status: AC

## 2020-10-02 NOTE — H&P (View-Only) (Signed)
[name], M.D. Gastroenterology & Hepatology [facility] [address] [place], [place] [address] Primary Care Physician: [name], MD [address] [place] [address]  Referring MD: PCP  I will communicate my assessment and recommendations to the referring MD via EMR. Note: Occasional unusual wording and randomly placed punctuation marks may result from the use of speech recognition technology to transcribe this document"  Chief Complaint: Abnormal CT of the abdomen  History of Present Illness: Brandon Buck is a 59 y.o. male with [place], CAD with catheterization in 2009 requiring stent in the L circumflex and RCA, CKD, gout, kidney cancer s/p R nephrectomy in 2004, avascular necrosis, who presents for evaluation of abnormal CT of the abdomen suggesting possible sigmoid mass.  Patient reports that for the last month he has presented some pain in the R side of the lower back that radiates to his RLQ, specially when he is sitting down or down.  He describes the pain as a shooting pain.  Due to this, the patient was ordered a CT scan of the abdomen that showed an abnormality in his colon as described below: CT abdomen pelvis w.o IV contrast 09/09/2020  1. No acute intra-abdominal or pelvic pathology. No evidence of metastatic disease in the abdomen or pelvis. 2. Fatty liver. 3. Colonic diverticulosis. 4. Focal area of underdistention of the sigmoid colon versus less likely colonic mass. No lymphadenopathy noted. 5. Solitary left kidney. No hydronephrosis or nephrolithiasis. 6. Aortic Atherosclerosis (ICD10-I70.0).  Patient states that he does not have any complaint at the moment.  He reports that sometimes he has presented some heartburn when he eats spicy food. He takes Protonix 40 mg every day and it controls his symptoms. He reports that he has occasional heartburn 3-4 times a month only when he  eats spicy food. The patient denies having any dysphagia, odynophagia, nausea, vomiting, fever, chills, hematochezia, melena, hematemesis, abdominal distention, diarrhea, jaundice, pruritus or weight loss.  Patient reports that he is scheduled to undergo radiation seeds for prostate cancer in January 2022.  He would like to have colonoscopy scheduled before this is performed as he is to get clearance before they proceed with this procedure.  Notably, the patient had Cologuard test on 02/11/2020, the result was negative.  Last EGD: never Last Colonoscopy: never  FHx: neg for any gastrointestinal/liver disease, father and uncle prostate cancer, mother kidney cancer, grandfather bladder cancer Social: neg smoking, alcohol or illicit drug use Surgical: right nephrectomy, hernia surgery in lower abdomen  Past Medical History: Past Medical History:  Diagnosis Date  . Anxiety   . Arthritis   . Cancer [facility])    right renal cancer, right radical nephrectomy 2004  . Chronic back pain   . Chronic hip pain, left   . CKD (chronic kidney disease) stage 3, GFR 30-59 ml/min (HCC)   . Depression   . GERD (gastroesophageal reflux disease)   . Gout   . Heart disease   . High blood pressure   . History of PTCA 2    2 stents  . History of stomach ulcers   . Hypercholesteremia   . Hypertension   . Kidney disease   . Prostate cancer ([place])    3/12 cores upt to 40% grade 2 cancer. Planning brachytherapy (2021)  . Proteinuria   . Renal insufficiency   . Tennis elbow     Past Surgical History: Past Surgical History:  Procedure  Laterality Date  . Heart catherization    . HERNIA REPAIR    . HIP SURGERY    . Kidney removed      Family History: Family History  Problem Relation Age of Onset  . Heart disease Other   . Cancer Other   . Kidney disease Other   . Heart disease Father   . Prostate cancer Father   . Heart disease Mother   . Lung cancer Mother   . Kidney cancer Mother   . Prostate  cancer Maternal Uncle   . Bladder Cancer Maternal Grandfather   . Breast cancer Neg Hx   . Colon cancer Neg Hx   . Pancreatic cancer Neg Hx     Social History: Social History   Tobacco Use  Smoking Status Former Smoker  . Packs/day: 1.50  . Years: 32.00  . Pack years: 48.00  . Types: Cigarettes  . Start date: 01/26/1971  . Quit date: 01/26/2003  . Years since quitting: 17.6  Smokeless Tobacco Never Used   Social History   Substance and Sexual Activity  Alcohol Use Yes  . Alcohol/week: 0.0 standard drinks   Comment: occ   Social History   Substance and Sexual Activity  Drug Use No    Allergies: Allergies  Allergen Reactions  . Other     Pt. Reports having seizures after anesthesia once.  Marland Kitchen Keflex [Cephalexin] Rash    Medications: Current Outpatient Medications  Medication Sig Dispense Refill  . allopurinol (ZYLOPRIM) 100 MG tablet TAKE 2 TABLETS BY MOUTH DAILY. 60 tablet 0  . ALPRAZolam (XANAX) 0.5 MG tablet Take 1 tablet (0.5 mg total) by mouth 3 (three) times daily as needed. for anxiety 30 tablet 0  . atorvastatin (LIPITOR) 80 MG tablet TAKE (1) TABLET BY MOUTH ONCE DAILY. 90 tablet 1  . diltiazem (CARDIZEM CD) 240 MG 24 hr capsule TAKE (1) CAPSULE BY MOUTH DAILY. 90 capsule 2  . ezetimibe (ZETIA) 10 MG tablet Take 10 mg by mouth daily.     . fluticasone (FLONASE) 50 MCG/ACT nasal spray Place 2 sprays into both nostrils daily. 16 g 6  . furosemide (LASIX) 20 MG tablet Take 1 tablet (20 mg total) by mouth daily. 30 tablet 3  . HYDROcodone-acetaminophen (NORCO/VICODIN) 5-325 MG tablet TAKE 1 TABLET BY MOUTH THREE TIMES DAILY AS NEEDED FOR MODERATE PAIN. 90 tablet 0  . lisinopril (ZESTRIL) 20 MG tablet Take 10 mg by mouth daily.    . metoprolol tartrate (LOPRESSOR) 50 MG tablet TAKE ONE HALF TABLET BY MOUTH TWICE DAILY. 90 tablet 3  . NICOTROL 10 MG inhaler INHALE 1 CARTRIDGE INTO THE LUNGS AS NEEDED FOR SMOKING CESSATION. 168 each 0  . nitroGLYCERIN (NITROSTAT) 0.4  MG SL tablet Place 0.4 mg under the tongue every 5 (five) minutes as needed.    . pantoprazole (PROTONIX) 40 MG tablet Take 1 tablet (40 mg total) by mouth 2 (two) times daily. 60 tablet 5  . aspirin 81 MG tablet Take 81 mg by mouth daily. (Patient not taking: Reported on 10/02/2020)    . cholecalciferol (VITAMIN D3) 25 MCG (1000 UNIT) tablet Take 2,000 Units by mouth daily. (Patient not taking: No sig reported)    . PEG-KCl-NaCl-NaSulf-Na Asc-C (PLENVU) 140 g SOLR Take 280 g by mouth once for 1 dose. 1 each 0  . predniSONE (DELTASONE) 20 MG tablet 3 tabs poqday 1-2, 2 tabs poqday 3-4, 1 tab poqday 5-6 (Patient not taking: Reported on 10/02/2020) 12 tablet 0  No current facility-administered medications for this visit.    Review of Systems: GENERAL: negative for malaise, night sweats HEENT: No changes in hearing or vision, no nose bleeds or other nasal problems. NECK: Negative for lumps, goiter, pain and significant neck swelling RESPIRATORY: Negative for cough, wheezing CARDIOVASCULAR: Negative for chest pain, leg swelling, palpitations, orthopnea GI: SEE HPI MUSCULOSKELETAL: Negative for joint pain or swelling, back pain, and muscle pain. SKIN: Negative for lesions, rash PSYCH: Negative for sleep disturbance, mood disorder and recent psychosocial stressors. HEMATOLOGY Negative for prolonged bleeding, bruising easily, and swollen nodes. ENDOCRINE: Negative for cold or heat intolerance, polyuria, polydipsia and goiter. NEURO: negative for tremor, gait imbalance, syncope and seizures. The remainder of the review of systems is noncontributory.   Physical Exam: BP (!) 158/86 (BP Location: Left Arm, Patient Position: Sitting, Cuff Size: Large)   Pulse 85   Temp 98.2 F (36.8 C) (Oral)   Ht 6' (1.829 m)   Wt 221 lb (100.2 kg)   BMI 29.97 kg/m  GENERAL: The patient is AO x3, in no acute distress. HEENT: Head is normocephalic and atraumatic. EOMI are intact. Mouth is well hydrated and  without lesions. NECK: Supple. No masses LUNGS: Clear to auscultation. No presence of rhonchi/wheezing/rales. Adequate chest expansion HEART: RRR, normal s1 and s2. ABDOMEN: Soft, nontender, no guarding, no peritoneal signs, and nondistended. BS +. No masses. Has a scar on the R side of his flank. EXTREMITIES: Without any cyanosis, clubbing, rash, lesions or edema. NEUROLOGIC: AOx3, no focal motor deficit. SKIN: no jaundice, no rashes   Imaging/Labs: as above  I personally reviewed and interpreted the available labs, imaging and endoscopic files.  Impression and Plan: Brandon Buck is a 59 y.o. male with PMH CAD with catheterization in 2009 requiring stent in the L circumflex and RCA, prostate cancer, CKD, gout, kidney cancer s/p R nephrectomy in 2004, avascular necrosis, who presents for evaluation of abnormal CT of the abdomen suggesting possible sigmoid mass.  The patient had an incidental finding in his most recent CT of the abdomen which suggested a possible mass in the sigmoid colon without presence of lymphadenopathy.  This could also be related to underdistention or other nonmalignant masses such as a lipoma.  However, he will need to get a colonoscopy to obtain clearance before undergoing implantation of radioactive seeds for treatment of his prostate cancer.  I explained to the patient that despite of the fact he has an alteration suggestive of a mass, this could also be an artifact especially in the context of her recent negative Cologuard which makes malignancy much less likely.  Patient understood and agreed.  Finally, he can continue taking his Protonix to improve his symptoms, he may try taking some Tums if he is exposed to spicy food.  - Schedule colonoscopy - Continue Protonix  All questions were answered.      [name], MD Gastroenterology and Hepatology [facility]

## 2020-10-02 NOTE — Progress Notes (Signed)
Brandon Buck, M.D. Gastroenterology & Hepatology [facility] [address] [place], [place] [address] Primary Care Physician: [name], MD [address] [place] [address]  Referring MD: PCP  I will communicate my assessment and recommendations to the referring MD via EMR. Note: Occasional unusual wording and randomly placed punctuation marks may result from the use of speech recognition technology to transcribe this document"  Chief Complaint: Abnormal CT of the abdomen  History of Present Illness: Brandon Buck is a 59 y.o. male with [place], CAD with catheterization in 2009 requiring stent in the L circumflex and RCA, CKD, gout, kidney cancer s/p R nephrectomy in 2004, avascular necrosis, who presents for evaluation of abnormal CT of the abdomen suggesting possible sigmoid mass.  Patient reports that for the last month he has presented some pain in the R side of the lower back that radiates to his RLQ, specially when he is sitting down or down.  He describes the pain as a shooting pain.  Due to this, the patient was ordered a CT scan of the abdomen that showed an abnormality in his colon as described below: CT abdomen pelvis w.o IV contrast 09/09/2020  1. No acute intra-abdominal or pelvic pathology. No evidence of metastatic disease in the abdomen or pelvis. 2. Fatty liver. 3. Colonic diverticulosis. 4. Focal area of underdistention of the sigmoid colon versus less likely colonic mass. No lymphadenopathy noted. 5. Solitary left kidney. No hydronephrosis or nephrolithiasis. 6. Aortic Atherosclerosis (ICD10-I70.0).  Patient states that he does not have any complaint at the moment.  He reports that sometimes he has presented some heartburn when he eats spicy food. He takes Protonix 40 mg every day and it controls his symptoms. He reports that he has occasional heartburn 3-4 times a month only when he  eats spicy food. The patient denies having any dysphagia, odynophagia, nausea, vomiting, fever, chills, hematochezia, melena, hematemesis, abdominal distention, diarrhea, jaundice, pruritus or weight loss.  Patient reports that he is scheduled to undergo radiation seeds for prostate cancer in January 2022.  He would like to have colonoscopy scheduled before this is performed as he is to get clearance before they proceed with this procedure.  Notably, the patient had Cologuard test on 02/11/2020, the result was negative.  Last EGD: never Last Colonoscopy: never  FHx: neg for any gastrointestinal/liver disease, father and uncle prostate cancer, mother kidney cancer, grandfather bladder cancer Social: neg smoking, alcohol or illicit drug use Surgical: right nephrectomy, hernia surgery in lower abdomen  Past Medical History: Past Medical History:  Diagnosis Date  . Anxiety   . Arthritis   . Cancer [facility])    right renal cancer, right radical nephrectomy 2004  . Chronic back pain   . Chronic hip pain, left   . CKD (chronic kidney disease) stage 3, GFR 30-59 ml/min (HCC)   . Depression   . GERD (gastroesophageal reflux disease)   . Gout   . Heart disease   . High blood pressure   . History of PTCA 2    2 stents  . History of stomach ulcers   . Hypercholesteremia   . Hypertension   . Kidney disease   . Prostate cancer ([place])    3/12 cores upt to 40% grade 2 cancer. Planning brachytherapy (2021)  . Proteinuria   . Renal insufficiency   . Tennis elbow     Past Surgical History: Past Surgical History:  Procedure  Laterality Date  . Heart catherization    . HERNIA REPAIR    . HIP SURGERY    . Kidney removed      Family History: Family History  Problem Relation Age of Onset  . Heart disease Other   . Cancer Other   . Kidney disease Other   . Heart disease Father   . Prostate cancer Father   . Heart disease Mother   . Lung cancer Mother   . Kidney cancer Mother   . Prostate  cancer Maternal Uncle   . Bladder Cancer Maternal Grandfather   . Breast cancer Neg Hx   . Colon cancer Neg Hx   . Pancreatic cancer Neg Hx     Social History: Social History   Tobacco Use  Smoking Status Former Smoker  . Packs/day: 1.50  . Years: 32.00  . Pack years: 48.00  . Types: Cigarettes  . Start date: 01/26/1971  . Quit date: 01/26/2003  . Years since quitting: 17.6  Smokeless Tobacco Never Used   Social History   Substance and Sexual Activity  Alcohol Use Yes  . Alcohol/week: 0.0 standard drinks   Comment: occ   Social History   Substance and Sexual Activity  Drug Use No    Allergies: Allergies  Allergen Reactions  . Other     Pt. Reports having seizures after anesthesia once.  Marland Kitchen Keflex [Cephalexin] Rash    Medications: Current Outpatient Medications  Medication Sig Dispense Refill  . allopurinol (ZYLOPRIM) 100 MG tablet TAKE 2 TABLETS BY MOUTH DAILY. 60 tablet 0  . ALPRAZolam (XANAX) 0.5 MG tablet Take 1 tablet (0.5 mg total) by mouth 3 (three) times daily as needed. for anxiety 30 tablet 0  . atorvastatin (LIPITOR) 80 MG tablet TAKE (1) TABLET BY MOUTH ONCE DAILY. 90 tablet 1  . diltiazem (CARDIZEM CD) 240 MG 24 hr capsule TAKE (1) CAPSULE BY MOUTH DAILY. 90 capsule 2  . ezetimibe (ZETIA) 10 MG tablet Take 10 mg by mouth daily.     . fluticasone (FLONASE) 50 MCG/ACT nasal spray Place 2 sprays into both nostrils daily. 16 g 6  . furosemide (LASIX) 20 MG tablet Take 1 tablet (20 mg total) by mouth daily. 30 tablet 3  . HYDROcodone-acetaminophen (NORCO/VICODIN) 5-325 MG tablet TAKE 1 TABLET BY MOUTH THREE TIMES DAILY AS NEEDED FOR MODERATE PAIN. 90 tablet 0  . lisinopril (ZESTRIL) 20 MG tablet Take 10 mg by mouth daily.    . metoprolol tartrate (LOPRESSOR) 50 MG tablet TAKE ONE HALF TABLET BY MOUTH TWICE DAILY. 90 tablet 3  . NICOTROL 10 MG inhaler INHALE 1 CARTRIDGE INTO THE LUNGS AS NEEDED FOR SMOKING CESSATION. 168 each 0  . nitroGLYCERIN (NITROSTAT) 0.4  MG SL tablet Place 0.4 mg under the tongue every 5 (five) minutes as needed.    . pantoprazole (PROTONIX) 40 MG tablet Take 1 tablet (40 mg total) by mouth 2 (two) times daily. 60 tablet 5  . aspirin 81 MG tablet Take 81 mg by mouth daily. (Patient not taking: Reported on 10/02/2020)    . cholecalciferol (VITAMIN D3) 25 MCG (1000 UNIT) tablet Take 2,000 Units by mouth daily. (Patient not taking: No sig reported)    . PEG-KCl-NaCl-NaSulf-Na Asc-C (PLENVU) 140 g SOLR Take 280 g by mouth once for 1 dose. 1 each 0  . predniSONE (DELTASONE) 20 MG tablet 3 tabs poqday 1-2, 2 tabs poqday 3-4, 1 tab poqday 5-6 (Patient not taking: Reported on 10/02/2020) 12 tablet 0  No current facility-administered medications for this visit.    Review of Systems: GENERAL: negative for malaise, night sweats HEENT: No changes in hearing or vision, no nose bleeds or other nasal problems. NECK: Negative for lumps, goiter, pain and significant neck swelling RESPIRATORY: Negative for cough, wheezing CARDIOVASCULAR: Negative for chest pain, leg swelling, palpitations, orthopnea GI: SEE HPI MUSCULOSKELETAL: Negative for joint pain or swelling, back pain, and muscle pain. SKIN: Negative for lesions, rash PSYCH: Negative for sleep disturbance, mood disorder and recent psychosocial stressors. HEMATOLOGY Negative for prolonged bleeding, bruising easily, and swollen nodes. ENDOCRINE: Negative for cold or heat intolerance, polyuria, polydipsia and goiter. NEURO: negative for tremor, gait imbalance, syncope and seizures. The remainder of the review of systems is noncontributory.   Physical Exam: BP (!) 158/86 (BP Location: Left Arm, Patient Position: Sitting, Cuff Size: Large)   Pulse 85   Temp 98.2 F (36.8 C) (Oral)   Ht 6' (1.829 m)   Wt 221 lb (100.2 kg)   BMI 29.97 kg/m  GENERAL: The patient is AO x3, in no acute distress. HEENT: Head is normocephalic and atraumatic. EOMI are intact. Mouth is well hydrated and  without lesions. NECK: Supple. No masses LUNGS: Clear to auscultation. No presence of rhonchi/wheezing/rales. Adequate chest expansion HEART: RRR, normal s1 and s2. ABDOMEN: Soft, nontender, no guarding, no peritoneal signs, and nondistended. BS +. No masses. Has a scar on the R side of his flank. EXTREMITIES: Without any cyanosis, clubbing, rash, lesions or edema. NEUROLOGIC: AOx3, no focal motor deficit. SKIN: no jaundice, no rashes   Imaging/Labs: as above  I personally reviewed and interpreted the available labs, imaging and endoscopic files.  Impression and Plan: Brandon Buck is a 59 y.o. male with PMH CAD with catheterization in 2009 requiring stent in the L circumflex and RCA, prostate cancer, CKD, gout, kidney cancer s/p R nephrectomy in 2004, avascular necrosis, who presents for evaluation of abnormal CT of the abdomen suggesting possible sigmoid mass.  The patient had an incidental finding in his most recent CT of the abdomen which suggested a possible mass in the sigmoid colon without presence of lymphadenopathy.  This could also be related to underdistention or other nonmalignant masses such as a lipoma.  However, he will need to get a colonoscopy to obtain clearance before undergoing implantation of radioactive seeds for treatment of his prostate cancer.  I explained to the patient that despite of the fact he has an alteration suggestive of a mass, this could also be an artifact especially in the context of her recent negative Cologuard which makes malignancy much less likely.  Patient understood and agreed.  Finally, he can continue taking his Protonix to improve his symptoms, he may try taking some Tums if he is exposed to spicy food.  - Schedule colonoscopy - Continue Protonix  All questions were answered.      Brandon Peppers, MD Gastroenterology and Hepatology [facility]

## 2020-10-02 NOTE — Telephone Encounter (Signed)
LeighAnn Darleny Sem, CMA  

## 2020-10-02 NOTE — Patient Instructions (Signed)
Schedule colonoscopy

## 2020-10-09 ENCOUNTER — Telehealth: Payer: Self-pay | Admitting: Radiation Oncology

## 2020-10-09 ENCOUNTER — Encounter: Payer: Self-pay | Admitting: Urology

## 2020-10-09 ENCOUNTER — Telehealth: Payer: Self-pay | Admitting: *Deleted

## 2020-10-09 NOTE — Progress Notes (Signed)
Patient is scheduled for colonoscopy on 10/28/20 with Dr. Eula Listen and tentatively scheduled for brachytherapy on 11/03/20 pending no need for further GI procedures following colonoscopy.  [name], MMS, PA-C [address] [facility] [place]: [phone]  Fax: [phone]

## 2020-10-09 NOTE — Telephone Encounter (Signed)
Called patient to remind of pre-seed appts. for 10-10-20, spoke with patient and he is aware of these appts.

## 2020-10-09 NOTE — Telephone Encounter (Signed)
Phoned patient to follow up about GI consult and colonoscopy. Phoned patient's home. No answer. Busy signal. Unable to leave a message. Phoned patient's cell. No answer. Left voicemail message with my direct number requesting a return call with an update.

## 2020-10-10 ENCOUNTER — Ambulatory Visit (HOSPITAL_COMMUNITY)
Admission: RE | Admit: 2020-10-10 | Discharge: 2020-10-10 | Disposition: A | Discharge status: Home / Self Care | Payer: PPO | Source: Ambulatory Visit | Attending: Urology | Admitting: Urology

## 2020-10-10 ENCOUNTER — Ambulatory Visit
Admission: RE | Admit: 2020-10-10 | Discharge: 2020-10-10 | Disposition: A | Discharge status: Home / Self Care | Payer: PPO | Source: Ambulatory Visit | Attending: Radiation Oncology | Admitting: Radiation Oncology

## 2020-10-10 ENCOUNTER — Ambulatory Visit
Admission: RE | Admit: 2020-10-10 | Discharge: 2020-10-10 | Disposition: A | Discharge status: Home / Self Care | Payer: PPO | Source: Ambulatory Visit | Attending: Urology | Admitting: Urology

## 2020-10-10 ENCOUNTER — Other Ambulatory Visit: Payer: Self-pay

## 2020-10-10 ENCOUNTER — Encounter (HOSPITAL_COMMUNITY)
Admission: RE | Admit: 2020-10-10 | Discharge: 2020-10-10 | Disposition: A | Discharge status: Home / Self Care | Payer: PPO | Source: Ambulatory Visit | Attending: Urology | Admitting: Urology

## 2020-10-10 DIAGNOSIS — C61 Malignant neoplasm of prostate: Secondary | ICD-10-CM | POA: Insufficient documentation

## 2020-10-10 DIAGNOSIS — Z01818 Encounter for other preprocedural examination: Secondary | ICD-10-CM

## 2020-10-10 NOTE — Progress Notes (Signed)
  Radiation Oncology         [phone] ________________________________  Name: Brandon Buck MRN: [id number]  Date: 10/10/2020  DOB: 03/25/1961  SIMULATION AND TREATMENT PLANNING NOTE PUBIC ARCH STUDY  MC:[id number], [name], MD  Alexis Frock, MD  DIAGNOSIS:  Oncology History   No history exists.    59 y.o. gentleman with Stage T1c adenocarcinoma of the prostate with Gleason score of 3+4, and PSA of 5.3.  COMPLEX SIMULATION:  The patient presented today for evaluation for possible prostate seed implant. He was brought to the radiation planning suite and placed supine on the CT couch. A 3-dimensional image study set was obtained in upload to the planning computer. There, on each axial slice, I contoured the prostate gland. Then, using three-dimensional radiation planning tools I reconstructed the prostate in view of the structures from the transperineal needle pathway to assess for possible pubic arch interference. In doing so, I did not appreciate any pubic arch interference. Also, the patient's prostate volume was estimated based on the drawn structure. The volume was 34.7 cc.  Given the pubic arch appearance and prostate volume, patient remains a good candidate to proceed with prostate seed implant. Today, he freely provided informed written consent to proceed.    PLAN: The patient will undergo prostate seed implant.   ________________________________  Sheral Apley. Tammi Klippel, M.D.    This document serves as a record of services personally performed by Tyler Pita, MD. It was created on his behalf by Wilburn Mylar, a trained medical scribe. The creation of this record is based on the scribe's personal observations and the provider's statements to them. This document has been checked and approved by the attending provider.

## 2020-10-10 NOTE — Progress Notes (Signed)
Per here for PAT appointment to get CXR / EKG done per radioactive prostate seed implant protocol.  Pt does note need EKG , he has one done in past year dated 03-14-2020 in epic.

## 2020-10-13 ENCOUNTER — Other Ambulatory Visit: Payer: Self-pay | Admitting: Family Medicine

## 2020-10-13 DIAGNOSIS — F411 Generalized anxiety disorder: Secondary | ICD-10-CM

## 2020-10-13 NOTE — Telephone Encounter (Signed)
Ok to refill??  Last office visit 08/29/2020.  Last refill 09/17/2020.

## 2020-10-27 ENCOUNTER — Other Ambulatory Visit (HOSPITAL_COMMUNITY)
Admission: RE | Admit: 2020-10-27 | Discharge: 2020-10-27 | Disposition: A | Discharge status: Home / Self Care | Payer: PPO | Source: Ambulatory Visit | Attending: Gastroenterology | Admitting: Gastroenterology

## 2020-10-27 ENCOUNTER — Other Ambulatory Visit: Payer: Self-pay

## 2020-10-27 DIAGNOSIS — Z01812 Encounter for preprocedural laboratory examination: Secondary | ICD-10-CM | POA: Diagnosis not present

## 2020-10-27 DIAGNOSIS — Z20822 Contact with and (suspected) exposure to covid-19: Secondary | ICD-10-CM | POA: Diagnosis not present

## 2020-10-27 LAB — BASIC METABOLIC PANEL
Anion gap: 10 (ref 5–15)
BUN: 14 mg/dL (ref 6–20)
CO2: 22 mmol/L (ref 22–32)
Calcium: 9.2 mg/dL (ref 8.9–10.3)
Chloride: 104 mmol/L (ref 98–111)
Creatinine, Ser: 1.49 mg/dL — ABNORMAL HIGH (ref 0.61–1.24)
GFR, Estimated: 53 mL/min — ABNORMAL LOW (ref >60)
Glucose, Bld: 90 mg/dL (ref 70–99)
Potassium: 4.1 mmol/L (ref 3.5–5.1)
Sodium: 136 mmol/L (ref 135–145)

## 2020-10-27 LAB — SARS CORONAVIRUS 2 (TAT 6-24 HRS): SARS Coronavirus 2: NEGATIVE

## 2020-10-28 ENCOUNTER — Encounter: Payer: Self-pay | Admitting: Urology

## 2020-10-28 ENCOUNTER — Ambulatory Visit (HOSPITAL_COMMUNITY): Payer: PPO

## 2020-10-28 ENCOUNTER — Other Ambulatory Visit: Payer: Self-pay

## 2020-10-28 ENCOUNTER — Encounter (HOSPITAL_COMMUNITY)
Admission: RE | Disposition: A | Discharge status: Home / Self Care | Payer: Self-pay | Source: Home / Self Care | Attending: Gastroenterology

## 2020-10-28 ENCOUNTER — Encounter (HOSPITAL_COMMUNITY): Payer: Self-pay | Admitting: Gastroenterology

## 2020-10-28 ENCOUNTER — Ambulatory Visit (HOSPITAL_COMMUNITY)
Admission: RE | Admit: 2020-10-28 | Discharge: 2020-10-28 | Disposition: A | Discharge status: Home / Self Care | Payer: PPO | Attending: Gastroenterology | Admitting: Gastroenterology

## 2020-10-28 DIAGNOSIS — K648 Other hemorrhoids: Secondary | ICD-10-CM | POA: Diagnosis not present

## 2020-10-28 DIAGNOSIS — Z884 Allergy status to anesthetic agent status: Secondary | ICD-10-CM | POA: Insufficient documentation

## 2020-10-28 DIAGNOSIS — D125 Benign neoplasm of sigmoid colon: Secondary | ICD-10-CM | POA: Insufficient documentation

## 2020-10-28 DIAGNOSIS — Z955 Presence of coronary angioplasty implant and graft: Secondary | ICD-10-CM | POA: Diagnosis not present

## 2020-10-28 DIAGNOSIS — Z905 Acquired absence of kidney: Secondary | ICD-10-CM | POA: Diagnosis not present

## 2020-10-28 DIAGNOSIS — Z8711 Personal history of peptic ulcer disease: Secondary | ICD-10-CM | POA: Insufficient documentation

## 2020-10-28 DIAGNOSIS — I10 Essential (primary) hypertension: Secondary | ICD-10-CM | POA: Diagnosis not present

## 2020-10-28 DIAGNOSIS — Z87891 Personal history of nicotine dependence: Secondary | ICD-10-CM | POA: Diagnosis not present

## 2020-10-28 DIAGNOSIS — Z8042 Family history of malignant neoplasm of prostate: Secondary | ICD-10-CM | POA: Diagnosis not present

## 2020-10-28 DIAGNOSIS — Z8546 Personal history of malignant neoplasm of prostate: Secondary | ICD-10-CM | POA: Insufficient documentation

## 2020-10-28 DIAGNOSIS — Z79899 Other long term (current) drug therapy: Secondary | ICD-10-CM | POA: Diagnosis not present

## 2020-10-28 DIAGNOSIS — K5730 Diverticulosis of large intestine without perforation or abscess without bleeding: Secondary | ICD-10-CM | POA: Insufficient documentation

## 2020-10-28 DIAGNOSIS — Z7982 Long term (current) use of aspirin: Secondary | ICD-10-CM | POA: Diagnosis not present

## 2020-10-28 DIAGNOSIS — Z881 Allergy status to other antibiotic agents status: Secondary | ICD-10-CM | POA: Insufficient documentation

## 2020-10-28 DIAGNOSIS — D123 Benign neoplasm of transverse colon: Secondary | ICD-10-CM | POA: Insufficient documentation

## 2020-10-28 DIAGNOSIS — Z85528 Personal history of other malignant neoplasm of kidney: Secondary | ICD-10-CM | POA: Insufficient documentation

## 2020-10-28 DIAGNOSIS — Z8052 Family history of malignant neoplasm of bladder: Secondary | ICD-10-CM | POA: Diagnosis not present

## 2020-10-28 DIAGNOSIS — Z8051 Family history of malignant neoplasm of kidney: Secondary | ICD-10-CM | POA: Diagnosis not present

## 2020-10-28 DIAGNOSIS — K635 Polyp of colon: Secondary | ICD-10-CM | POA: Diagnosis not present

## 2020-10-28 DIAGNOSIS — R933 Abnormal findings on diagnostic imaging of other parts of digestive tract: Secondary | ICD-10-CM | POA: Diagnosis not present

## 2020-10-28 DIAGNOSIS — F418 Other specified anxiety disorders: Secondary | ICD-10-CM | POA: Diagnosis not present

## 2020-10-28 DIAGNOSIS — Z801 Family history of malignant neoplasm of trachea, bronchus and lung: Secondary | ICD-10-CM | POA: Diagnosis not present

## 2020-10-28 HISTORY — PX: OTHER SURGICAL HISTORY: SHX169

## 2020-10-28 HISTORY — PX: POLYPECTOMY: SHX149

## 2020-10-28 HISTORY — PX: COLONOSCOPY WITH PROPOFOL: SHX5780

## 2020-10-28 SURGERY — COLONOSCOPY WITH PROPOFOL
Anesthesia: General

## 2020-10-28 MED ORDER — PROPOFOL 10 MG/ML IV BOLUS
INTRAVENOUS | Status: DC | PRN
  Administered 2020-10-28: 20 mg via INTRAVENOUS
  Administered 2020-10-28: 60 mg via INTRAVENOUS

## 2020-10-28 MED ORDER — STERILE WATER FOR IRRIGATION IR SOLN
Status: DC | PRN
  Administered 2020-10-28: 100 mL

## 2020-10-28 MED ORDER — LACTATED RINGERS IV SOLN: INTRAVENOUS | Status: DC

## 2020-10-28 MED ORDER — PROPOFOL 500 MG/50ML IV EMUL
INTRAVENOUS | Status: DC | PRN
  Administered 2020-10-28: 125 ug/kg/min via INTRAVENOUS

## 2020-10-28 NOTE — Interval H&P Note (Signed)
History and Physical Interval Note:  10/28/2020 7:32 AM  Brandon Buck is a 60 y.o. male with PMH PROSTATE CANCER, CAD with catheterization in 2009 requiring stent in the L circumflex and RCA, CKD, gout, kidney cancer s/p R nephrectomy in 2004, avascular necrosis, comes to the hospital for evaluation of an abnormal CT scan suggesting a possible sigmoid colon mass.    Underwent a CT of the abdomen on 09/09/2020 which showed presence of focal underdistention of the sigmoid colon without lymphadenopathy but no other intra-abdominal pathology.  He has not presented any rectal bleeding, abdominal pain, nausea, vomiting, fever, chills, melena, unintentional weight loss.  He is scheduled to undergo placement of radiation seeds next week but he is waiting to have clearance from the gastrointestinal perspective.  Notably he had a negative Cologuard on 02/11/2020, has not had a colonoscopy in the past.  No family history of colon cancer.  BP (!) 155/80   Pulse 89   Temp 97.7 F (36.5 C) (Oral)   Resp (!) 23   Ht 6' (1.829 m)   SpO2 97%   BMI 29.97 kg/m   GENERAL: The patient is AO x3, in no acute distress. HEENT: Head is normocephalic and atraumatic. EOMI are intact. Mouth is well hydrated and without lesions. NECK: Supple. No masses LUNGS: Clear to auscultation. No presence of rhonchi/wheezing/rales. Adequate chest expansion HEART: RRR, normal s1 and s2. ABDOMEN: Soft, nontender, no guarding, no peritoneal signs, and nondistended. BS +. No masses. EXTREMITIES: Without any cyanosis, clubbing, rash, lesions or edema. NEUROLOGIC: AOx3, no focal motor deficit. SKIN: no jaundice, no rashes  Brandon Buck  has presented today for surgery, with the diagnosis of Abnormal Ct Scan.  The various methods of treatment have been discussed with the patient and family. After consideration of risks, benefits and other options for treatment, the patient has consented to  Procedure(s) with comments: COLONOSCOPY  WITH PROPOFOL (N/A) - 2:30 as a surgical intervention.  The patient's history has been reviewed, patient examined, no change in status, stable for surgery.  I have reviewed the patient's chart and labs.  Questions were answered to the patient's satisfaction.     Brandon Buck

## 2020-10-28 NOTE — Anesthesia Preprocedure Evaluation (Signed)
Anesthesia Evaluation  Patient identified by MRN, date of birth, ID band Patient awake    Reviewed: Allergy & Precautions, H&P , NPO status , Patient's Chart, lab work & pertinent test results, reviewed documented beta blocker date and time   Airway Mallampati: III  TM Distance: >3 FB Neck ROM: full    Dental no notable dental hx.    Pulmonary neg pulmonary ROS, former smoker,    Pulmonary exam normal breath sounds clear to auscultation       Cardiovascular Exercise Tolerance: Good hypertension, + CAD   Rhythm:regular Rate:Normal     Neuro/Psych PSYCHIATRIC DISORDERS Anxiety Depression negative neurological ROS     GI/Hepatic Neg liver ROS, GERD  Medicated,  Endo/Other  negative endocrine ROS  Renal/GU Renal disease  negative genitourinary   Musculoskeletal   Abdominal   Peds  Hematology negative hematology ROS (+)   Anesthesia Other Findings   Reproductive/Obstetrics negative OB ROS                             Anesthesia Physical Anesthesia Plan  ASA: III  Anesthesia Plan: General   Post-op Pain Management:    Induction:   PONV Risk Score and Plan: Propofol infusion  Airway Management Planned:   Additional Equipment:   Intra-op Plan:   Post-operative Plan:   Informed Consent: I have reviewed the patients History and Physical, chart, labs and discussed the procedure including the risks, benefits and alternatives for the proposed anesthesia with the patient or authorized representative who has indicated his/her understanding and acceptance.     Dental Advisory Given  Plan Discussed with: CRNA  Anesthesia Plan Comments:         Anesthesia Quick Evaluation

## 2020-10-28 NOTE — Op Note (Signed)
[facility] Patient Name: Brandon Buck Procedure Date: 10/28/2020 7:13 AM MRN: [id number] Date of Birth: 21-Sep-1961 Attending MD: [name] ,  CSN: [id number] Age: 60 Admit Type: Outpatient Procedure:                Colonoscopy Indications:              Abnormal CT of the GI tract Providers:                [name], Crystal Page, Raphael Gibney,                            Technician Referring MD:              Medicines:                Monitored Anesthesia Care Complications:            No immediate complications. Estimated Blood Loss:     Estimated blood loss: none. Procedure:                Pre-Anesthesia Assessment:                           - Prior to the procedure, a History and Physical                            was performed, and patient medications, allergies                            and sensitivities were reviewed. The patient's                            tolerance of previous anesthesia was reviewed.                           - The risks and benefits of the procedure and the                            sedation options and risks were discussed with the                            patient. All questions were answered and informed                            consent was obtained.                           - ASA Grade Assessment: II - A patient with mild                            systemic disease.                           After obtaining informed consent, the colonoscope                            was passed under direct vision. Throughout the  procedure, the patient's blood pressure, pulse, and                            oxygen saturations were monitored continuously. The                            [id number]-[id number] ([id number]) scope was introduced through                            the anus and advanced to the the cecum, identified                            by appendiceal orifice and ileocecal valve. The                             colonoscopy was performed without difficulty. The                            patient tolerated the procedure well. Scope In: 7:42:00 AM Scope Out: 8:12:24 AM Scope Withdrawal Time: 0 hours 25 minutes 43 seconds  Total Procedure Duration: 0 hours 30 minutes 24 seconds  Findings:      The perianal and digital rectal examinations were normal.      Two sessile polyps were found in the transverse colon. The polyps were 4       to 8 mm in size.      Five sessile polyps were found in the sigmoid colon. The polyps were 3       to 8 mm in size. These polyps were removed with a cold snare. Resection       and retrieval were complete.      Multiple small and large-mouthed diverticula were found in the sigmoid       colon and descending colon.      Non-bleeding internal hemorrhoids were found during retroflexion. The       hemorrhoids were small. Impression:               - Two 4 to 8 mm polyps in the transverse colon.                           - Five 3 to 8 mm polyps in the sigmoid colon,                            removed with a cold snare. Resected and retrieved.                           - Diverticulosis in the sigmoid colon and in the                            descending colon.                           - Non-bleeding internal hemorrhoids. Moderate Sedation:      Per Anesthesia Care Recommendation:           - Discharge patient to home (ambulatory).                           -  Resume previous diet.                           - Await pathology results.                           - Repeat colonoscopy for surveillance based on                            pathology results.                           - OK to proceed with urological procedure from the                            GI perspective. Procedure Code(s):        --- Professional ---                           [phone], GC, Colonoscopy, flexible; with removal of                            tumor(s), polyp(s), or other lesion(s) by snare                             technique Diagnosis Code(s):        --- Professional ---                           K63.5, Polyp of colon                           K64.8, Other hemorrhoids                           K57.30, Diverticulosis of large intestine without                            perforation or abscess without bleeding                           R93.3, Abnormal findings on diagnostic imaging of                            other parts of digestive tract CPT copyright 2019 American Medical Association. All rights reserved. The codes documented in this report are preliminary and upon coder review may  be revised to meet current compliance requirements. [name], MD [name],  10/28/2020 8:27:43 AM This report has been signed electronically. Number of Addenda: 0

## 2020-10-28 NOTE — Anesthesia Postprocedure Evaluation (Signed)
Anesthesia Post Note  Patient: Brandon Buck  Procedure(s) Performed: COLONOSCOPY WITH PROPOFOL (N/A ) POLYPECTOMY INTESTINAL  Patient location during evaluation: Endoscopy Anesthesia Type: General Level of consciousness: awake, oriented, awake and alert and patient cooperative Pain management: pain level controlled Vital Signs Assessment: post-procedure vital signs reviewed and stable Respiratory status: spontaneous breathing, respiratory function stable and nonlabored ventilation Cardiovascular status: blood pressure returned to baseline and stable Postop Assessment: no backache and no headache Anesthetic complications: no   No complications documented.   Last Vitals:  Vitals:   10/28/20 0658  BP: (!) 155/80  Pulse: 89  Resp: (!) 23  Temp: 36.5 C  SpO2: 97%    Last Pain:  Vitals:   10/28/20 0736  TempSrc:   PainSc: 0-No pain                 Karna Dupes

## 2020-10-28 NOTE — Discharge Instructions (Signed)
You are being discharged home Restart previous diet We will call you back with the pathology report A repeat colonoscopy will be performed in the future, the time to repeat colonoscopy will depend on the pathology report   Colonoscopy, Adult, Care After This sheet gives you information about how to care for yourself after your procedure. Your health care provider may also give you more specific instructions. If you have problems or questions, contact your health care provider. What can I expect after the procedure? After the procedure, it is common to have:  A small amount of blood in your stool for 24 hours after the procedure.  Some gas.  Mild cramping or bloating of your abdomen. Follow these instructions at home: Eating and drinking   Drink enough fluid to keep your urine pale yellow.  Resume your normal diet  Activity  Rest as told by your health care provider  General instructions  For the first 24 hours after the procedure: ? Do not drive or use machinery. ? Do not sign important documents. ? Do not drink alcohol. ? Do your regular daily activities at a slower pace than normal. ? Eat soft foods that are easy to digest.  Take over-the-counter and prescription medicines only as told by your health care provider.  Keep all follow-up visits as told by your health care provider. This is important. Contact a health care provider if:  You have blood in your stool 2-3 days after the procedure. Get help right away if you have:  More than a small spotting of blood in your stool.  Large blood clots in your stool.  Swelling of your abdomen.  Nausea or vomiting.  A fever.  Increasing pain in your abdomen that is not relieved with medicine. Summary  After the procedure, it is common to have a small amount of blood in your stool. You may also have mild cramping and bloating of your abdomen.  For the first 24 hours after the procedure, do not drive or use machinery,  sign important documents, or drink alcohol.  Get help right away if you have a lot of blood in your stool, nausea or vomiting, a fever, or increased pain in your abdomen. This information is not intended to replace advice given to you by your health care provider. Make sure you discuss any questions you have with your health care provider. Document Revised: 05/07/2019 Document Reviewed: 05/07/2019 Elsevier Patient Education  [place].

## 2020-10-28 NOTE — Progress Notes (Signed)
Per communication from Dr. Jenetta Downer, the colonoscopy for Brandon Buck showed 7 benign polyps, no alterations concerning for cancer. He is cleared from the GI perspective to undergo the urological procedure as scheduled on 11/03/20.  [name], MMS, PA-C [place] [facility] [place]: [phone]  Fax: [phone]

## 2020-10-28 NOTE — Transfer of Care (Signed)
Immediate Anesthesia Transfer of Care Note  Patient: Brandon Buck  Procedure(s) Performed: COLONOSCOPY WITH PROPOFOL (N/A ) POLYPECTOMY INTESTINAL  Patient Location: Endoscopy Unit  Anesthesia Type:General  Level of Consciousness: awake, alert , oriented and patient cooperative  Airway & Oxygen Therapy: Patient Spontanous Breathing  Post-op Assessment: Report given to RN, Post -op Vital signs reviewed and stable and Patient moving all extremities  Post vital signs: Reviewed and stable  Last Vitals:  Vitals Value Taken Time  BP    Temp    Pulse    Resp    SpO2      Last Pain:  Vitals:   10/28/20 0736  TempSrc:   PainSc: 0-No pain      Patients Stated Pain Goal: 2 (89/84/21 0312)  Complications: No complications documented.

## 2020-10-29 ENCOUNTER — Encounter (HOSPITAL_COMMUNITY)
Admission: RE | Admit: 2020-10-29 | Discharge: 2020-10-29 | Disposition: A | Discharge status: Home / Self Care | Payer: PPO | Source: Ambulatory Visit | Attending: Urology | Admitting: Urology

## 2020-10-29 ENCOUNTER — Telehealth: Payer: Self-pay | Admitting: Pharmacist

## 2020-10-29 ENCOUNTER — Other Ambulatory Visit: Payer: Self-pay

## 2020-10-29 ENCOUNTER — Encounter (HOSPITAL_BASED_OUTPATIENT_CLINIC_OR_DEPARTMENT_OTHER): Payer: Self-pay | Admitting: Urology

## 2020-10-29 DIAGNOSIS — Z01818 Encounter for other preprocedural examination: Secondary | ICD-10-CM | POA: Insufficient documentation

## 2020-10-29 LAB — CBC
HCT: 45.2 % (ref 39.0–52.0)
Hemoglobin: 15.6 g/dL (ref 13.0–17.0)
MCH: 31.4 pg (ref 26.0–34.0)
MCHC: 34.5 g/dL (ref 30.0–36.0)
MCV: 90.9 fL (ref 80.0–100.0)
Platelets: 204 10*3/uL (ref 150–400)
RBC: 4.97 MIL/uL (ref 4.22–5.81)
RDW: 13.2 % (ref 11.5–15.5)
WBC: 7.4 10*3/uL (ref 4.0–10.5)
nRBC: 0 % (ref 0.0–0.2)

## 2020-10-29 LAB — COMPREHENSIVE METABOLIC PANEL
ALT: 39 U/L (ref 0–44)
AST: 30 U/L (ref 15–41)
Albumin: 4.6 g/dL (ref 3.5–5.0)
Alkaline Phosphatase: 72 U/L (ref 38–126)
Anion gap: 10 (ref 5–15)
BUN: 15 mg/dL (ref 6–20)
CO2: 23 mmol/L (ref 22–32)
Calcium: 9.8 mg/dL (ref 8.9–10.3)
Chloride: 107 mmol/L (ref 98–111)
Creatinine, Ser: 1.51 mg/dL — ABNORMAL HIGH (ref 0.61–1.24)
GFR, Estimated: 53 mL/min — ABNORMAL LOW (ref >60)
Glucose, Bld: 79 mg/dL (ref 70–99)
Potassium: 4.1 mmol/L (ref 3.5–5.1)
Sodium: 140 mmol/L (ref 135–145)
Total Bilirubin: 0.8 mg/dL (ref 0.3–1.2)
Total Protein: 7.7 g/dL (ref 6.5–8.1)

## 2020-10-29 LAB — SURGICAL PATHOLOGY

## 2020-10-29 LAB — PROTIME-INR
INR: 1 (ref 0.8–1.2)
Prothrombin Time: 12.3 seconds (ref 11.4–15.2)

## 2020-10-29 LAB — APTT: aPTT: 32 seconds (ref 24–36)

## 2020-10-29 NOTE — Progress Notes (Addendum)
Spoke w/ via phone for pre-op interview---pt Lab needs dos---- none              COVID test ------rapid covid dos 730  Am 11-03-2020 [facility], 2005 [employer]/green [employer]  Arrive at -------730 am 11-03-2020 NPO after MN NO Solid Food.  Clear liquids from MN until---830 am then npo Medications to take morning of surgery -----atorvastatin, metorpolol tartrate, diltiazem, allopurinol, protonix, zetia Diabetic medication -----n/a Patient Special Instructions -----fleets enama day of surgery Pre-Op special Istructions -----none Patient verbalized understanding of instructions that were given at this phone interview. Patient denies shortness of breath, chest pain, fever, cough, pt has not used nitroglycerin in a long time per pt. at this phone interview.    PCP: dr Gwyndolyn Saxon [facility] Cardiologist : dr Natividad Brood 12-15-2019 epic Chest x-ray : 10-10-2020 epic EKG : 03-14-2020 Echo :08-12-2014 epic Stress test: 05-25-2016 in dr Bronson Ing note 12-15-2019  Cardiac Cath : 01-18-2016 in [place] 12-05-2019 note Activity level: does own housework and errands can climb stairs without dofficulty Sleep Study/ CPAP :n/a Fasting Blood Sugar :      / Checks Blood Sugar -- times a day:  n/a Blood Thinner/ Instructions /Last Dose:n/a ASA / Instructions/ Last Dose : stopped 81 mg aspirin 10-15-2021 for 10-28-2020 colonscopy and will remian off 81 mg aspirin lov nephrology dr Theador Hawthorne 04-09-2020 care everywhere

## 2020-10-29 NOTE — Chronic Care Management (AMB) (Signed)
Chronic Care Management Pharmacy Assistant   Name: Brandon Buck  MRN: [id number] DOB: Jun 22, 1961  Reason for Encounter: Disease State for HTN  Patient Questions:  1.  Have you seen any other providers since your last visit? Yes.   2.  Any changes in your medicines or health? Yes.    PCP : [name], MD   Their chronic conditions include: CKD, Gout, HTN, HLD.  Office Visits: 08/29/20 with Dr. Dennard Schaumann Images scheduled. Started Prednisone 20 mg (Completed)   Consults: 10/02/20 Gastro scheduled colonoscopy. Stopped Famotidine.   09/02/20 Oncology discussion surgery. No medication changes.    Hospital: 10/28/20 Colonoscopy procedure. (Discharged)   Allergies:   Allergies  Allergen Reactions   Other     Pt. Reports having seizures after anesthesia once.   Keflex [Cephalexin] Rash    Medications: Outpatient Encounter Medications as of 10/29/2020  Medication Sig Note   allopurinol (ZYLOPRIM) 100 MG tablet TAKE 2 TABLETS BY MOUTH DAILY. (Patient taking differently: Take 200 mg by mouth 2 (two) times daily.)    ALPRAZolam (XANAX) 0.5 MG tablet Take 1 tablet (0.5 mg total) by mouth 2 (two) times daily as needed for anxiety.    aspirin 81 MG tablet Take 81 mg by mouth daily.    atorvastatin (LIPITOR) 80 MG tablet TAKE (1) TABLET BY MOUTH ONCE DAILY. (Patient taking differently: Take 80 mg by mouth daily.)    diltiazem (CARDIZEM CD) 240 MG 24 hr capsule TAKE (1) CAPSULE BY MOUTH DAILY. (Patient taking differently: Take 240 mg by mouth daily.)    ezetimibe (ZETIA) 10 MG tablet Take 10 mg by mouth daily.     fluticasone (FLONASE) 50 MCG/ACT nasal spray Place 2 sprays into both nostrils daily. 10/02/2020: Per patient as needed.   furosemide (LASIX) 20 MG tablet Take 1 tablet (20 mg total) by mouth daily.    HYDROcodone-acetaminophen (NORCO/VICODIN) 5-325 MG tablet TAKE 1 TABLET BY MOUTH THREE TIMES DAILY AS NEEDED FOR MODERATE PAIN.    lisinopril (ZESTRIL) 20 MG  tablet Take 20 mg by mouth daily.    metoprolol tartrate (LOPRESSOR) 50 MG tablet TAKE ONE HALF TABLET BY MOUTH TWICE DAILY. (Patient taking differently: Take 25 mg by mouth 2 (two) times daily.)    NICOTROL 10 MG inhaler INHALE 1 CARTRIDGE INTO THE LUNGS AS NEEDED FOR SMOKING CESSATION. (Patient taking differently: Inhale 1 continuous puffing into the lungs daily as needed for smoking cessation.)    nitroGLYCERIN (NITROSTAT) 0.4 MG SL tablet Place 0.4 mg under the tongue every 5 (five) minutes as needed.    pantoprazole (PROTONIX) 40 MG tablet Take 1 tablet (40 mg total) by mouth 2 (two) times daily. (Patient taking differently: Take 40 mg by mouth daily.)    No facility-administered encounter medications on file as of 10/29/2020.    Current Diagnosis: Patient Active Problem List   Diagnosis Date Noted   Abnormal abdominal CT scan 10/02/2020   Malignant neoplasm of prostate ([place]) 09/02/2020   Essential hypertension 10/05/2019   Hyperkalemia 10/05/2019   CKD (chronic kidney disease) stage 3, GFR 30-59 ml/min ([facility])    Proteinuria    History of PTCA 2    Gout    Plantar fasciitis 03/01/2012   HIP PAIN 11/11/2008   ASEPTIC NECROSIS 11/11/2008    Goals Addressed   None    Reviewed chart prior to disease state call. Spoke with patient regarding BP  Recent Office Vitals: BP Readings from Last 3 Encounters:  10/28/20 121/77  10/02/20 Marland Kitchen)  158/86  09/02/20 (!) 182/97   Pulse Readings from Last 3 Encounters:  10/28/20 77  10/02/20 85  09/02/20 (!) 106    Wt Readings from Last 3 Encounters:  10/02/20 221 lb (100.2 kg)  09/02/20 222 lb 12.8 oz (101.1 kg)  08/29/20 220 lb (99.8 kg)     Kidney Function Lab Results  Component Value Date/Time   CREATININE 1.49 (H) 10/27/2020 10:23 AM   CREATININE 1.76 (H) 08/29/2020 11:27 AM   CREATININE 1.90 (H) 03/14/2020 10:31 AM   CREATININE 2.12 (H) 01/31/2020 08:19 AM   GFRNONAA 53 (L) 10/27/2020 10:23 AM   GFRNONAA 41 (L)  08/29/2020 11:27 AM   GFRAA 48 (L) 08/29/2020 11:27 AM    BMP Latest Ref Rng & Units 10/27/2020 08/29/2020 03/14/2020  Glucose 70 - 99 mg/dL 90 94 104(H)  BUN 6 - 20 mg/dL 14 16 27(H)  Creatinine 0.61 - 1.24 mg/dL 1.49(H) 1.76(H) 1.90(H)  BUN/Creat Ratio 6 - 22 (calc) - 9 -  Sodium 135 - 145 mmol/L 136 140 137  Potassium 3.5 - 5.1 mmol/L 4.1 4.6 4.4  Chloride 98 - 111 mmol/L 104 103 100  CO2 22 - 32 mmol/L 22 26 23   Calcium 8.9 - 10.3 mg/dL 9.2 9.2 9.1     Current antihypertensive regimen:   Diltiazem 240 mg daily  Metoprolol tartrate 50 mg   Lisinopril 20 mg   Upon reviewing the patients chart I was unable to call patient due to recent hospital admission/surgery on 10/28/20. I will attempt to reach out to patient next month in regards of his HTN.    Follow-Up:  Pharmacist Review   Charlann Lange, [place] Pharmacist Assistant [phone]

## 2020-10-31 ENCOUNTER — Telehealth: Payer: Self-pay | Admitting: *Deleted

## 2020-10-31 ENCOUNTER — Encounter (HOSPITAL_COMMUNITY): Payer: Self-pay | Admitting: Gastroenterology

## 2020-10-31 NOTE — Telephone Encounter (Signed)
CALLED PATIENT TO REMIND OF PROCEDURE FOR 11-03-20, LVM FOR A RETURN CALL

## 2020-10-31 NOTE — Telephone Encounter (Signed)
CALLED PATIENT TO REMIND OF IMPLANT FOR 11-03-20, LINE BUSY WILL CALL LATER

## 2020-11-03 ENCOUNTER — Encounter (HOSPITAL_BASED_OUTPATIENT_CLINIC_OR_DEPARTMENT_OTHER)
Admission: RE | Disposition: A | Discharge status: Home / Self Care | Payer: Self-pay | Source: Ambulatory Visit | Attending: Urology

## 2020-11-03 ENCOUNTER — Ambulatory Visit (HOSPITAL_BASED_OUTPATIENT_CLINIC_OR_DEPARTMENT_OTHER): Payer: PPO | Admitting: Anesthesiology

## 2020-11-03 ENCOUNTER — Ambulatory Visit (HOSPITAL_COMMUNITY): Payer: PPO

## 2020-11-03 ENCOUNTER — Encounter (HOSPITAL_BASED_OUTPATIENT_CLINIC_OR_DEPARTMENT_OTHER): Payer: Self-pay | Admitting: Urology

## 2020-11-03 ENCOUNTER — Ambulatory Visit (HOSPITAL_BASED_OUTPATIENT_CLINIC_OR_DEPARTMENT_OTHER)
Admission: RE | Admit: 2020-11-03 | Discharge: 2020-11-03 | Disposition: A | Discharge status: Home / Self Care | Payer: PPO | Source: Ambulatory Visit | Attending: Urology | Admitting: Urology

## 2020-11-03 DIAGNOSIS — Z955 Presence of coronary angioplasty implant and graft: Secondary | ICD-10-CM | POA: Insufficient documentation

## 2020-11-03 DIAGNOSIS — Z85528 Personal history of other malignant neoplasm of kidney: Secondary | ICD-10-CM | POA: Diagnosis not present

## 2020-11-03 DIAGNOSIS — Z79899 Other long term (current) drug therapy: Secondary | ICD-10-CM | POA: Diagnosis not present

## 2020-11-03 DIAGNOSIS — N1832 Chronic kidney disease, stage 3b: Secondary | ICD-10-CM | POA: Diagnosis not present

## 2020-11-03 DIAGNOSIS — C61 Malignant neoplasm of prostate: Secondary | ICD-10-CM | POA: Diagnosis not present

## 2020-11-03 DIAGNOSIS — E7800 Pure hypercholesterolemia, unspecified: Secondary | ICD-10-CM | POA: Diagnosis not present

## 2020-11-03 DIAGNOSIS — Z20822 Contact with and (suspected) exposure to covid-19: Secondary | ICD-10-CM | POA: Insufficient documentation

## 2020-11-03 DIAGNOSIS — I129 Hypertensive chronic kidney disease with stage 1 through stage 4 chronic kidney disease, or unspecified chronic kidney disease: Secondary | ICD-10-CM | POA: Diagnosis not present

## 2020-11-03 DIAGNOSIS — Z905 Acquired absence of kidney: Secondary | ICD-10-CM | POA: Diagnosis not present

## 2020-11-03 DIAGNOSIS — Z87891 Personal history of nicotine dependence: Secondary | ICD-10-CM | POA: Diagnosis not present

## 2020-11-03 HISTORY — DX: Other complications of anesthesia, initial encounter: T88.59XA

## 2020-11-03 HISTORY — PX: SPACE OAR INSTILLATION: SHX6769

## 2020-11-03 HISTORY — PX: RADIOACTIVE SEED IMPLANT: SHX5150

## 2020-11-03 HISTORY — PX: CYSTOSCOPY: SHX5120

## 2020-11-03 HISTORY — DX: Atherosclerotic heart disease of native coronary artery without angina pectoris: I25.10

## 2020-11-03 LAB — RESP PANEL BY RT-PCR (FLU A&B, COVID) ARPGX2
Influenza A by PCR: NEGATIVE
Influenza B by PCR: NEGATIVE
SARS Coronavirus 2 by RT PCR: NEGATIVE

## 2020-11-03 SURGERY — INSERTION, RADIATION SOURCE, PROSTATE
Anesthesia: General | Site: Prostate

## 2020-11-03 MED ORDER — CIPROFLOXACIN IN D5W 400 MG/200ML IV SOLN
400.0000 mg | INTRAVENOUS | Status: AC
  Administered 2020-11-03: 400 mg via INTRAVENOUS

## 2020-11-03 MED ORDER — MIDAZOLAM HCL 5 MG/5ML IJ SOLN
INTRAMUSCULAR | Status: DC | PRN
  Administered 2020-11-03: 2 mg via INTRAVENOUS

## 2020-11-03 MED ORDER — ONDANSETRON HCL 4 MG/2ML IJ SOLN
INTRAMUSCULAR | Status: DC | PRN
  Administered 2020-11-03: 4 mg via INTRAVENOUS

## 2020-11-03 MED ORDER — LACTATED RINGERS IV SOLN: INTRAVENOUS | Status: DC | PRN

## 2020-11-03 MED ORDER — MIDAZOLAM HCL 2 MG/2ML IJ SOLN
INTRAMUSCULAR | Status: AC
  Filled 2020-11-03: qty 2

## 2020-11-03 MED ORDER — OXYCODONE HCL 5 MG PO TABS: 5.0000 mg | ORAL_TABLET | Freq: Once | ORAL | Status: DC | PRN

## 2020-11-03 MED ORDER — EPHEDRINE SULFATE 50 MG/ML IJ SOLN
INTRAMUSCULAR | Status: DC | PRN
  Administered 2020-11-03: 10 mg via INTRAVENOUS

## 2020-11-03 MED ORDER — PROPOFOL 10 MG/ML IV BOLUS
INTRAVENOUS | Status: DC | PRN
  Administered 2020-11-03: 160 mg via INTRAVENOUS
  Administered 2020-11-03: 40 mg via INTRAVENOUS

## 2020-11-03 MED ORDER — HYDROMORPHONE HCL 1 MG/ML IJ SOLN: 0.2500 mg | INTRAMUSCULAR | Status: DC | PRN

## 2020-11-03 MED ORDER — IOHEXOL 300 MG/ML  SOLN
INTRAMUSCULAR | Status: DC | PRN
  Administered 2020-11-03: 7 mL

## 2020-11-03 MED ORDER — LIDOCAINE HCL (PF) 2 % IJ SOLN
INTRAMUSCULAR | Status: AC
  Filled 2020-11-03: qty 5

## 2020-11-03 MED ORDER — SODIUM CHLORIDE 0.9 % IV SOLN
INTRAVENOUS | Status: AC | PRN
  Administered 2020-11-03: 1000 mL

## 2020-11-03 MED ORDER — LIDOCAINE HCL (CARDIAC) PF 100 MG/5ML IV SOSY
PREFILLED_SYRINGE | INTRAVENOUS | Status: DC | PRN
  Administered 2020-11-03: 100 mg via INTRAVENOUS

## 2020-11-03 MED ORDER — DEXAMETHASONE SODIUM PHOSPHATE 10 MG/ML IJ SOLN
INTRAMUSCULAR | Status: AC
  Filled 2020-11-03: qty 1

## 2020-11-03 MED ORDER — PHENYLEPHRINE 40 MCG/ML (10ML) SYRINGE FOR IV PUSH (FOR BLOOD PRESSURE SUPPORT)
PREFILLED_SYRINGE | INTRAVENOUS | Status: AC
  Filled 2020-11-03: qty 10

## 2020-11-03 MED ORDER — FENTANYL CITRATE (PF) 100 MCG/2ML IJ SOLN
INTRAMUSCULAR | Status: AC
  Filled 2020-11-03: qty 2

## 2020-11-03 MED ORDER — SODIUM CHLORIDE (PF) 0.9 % IJ SOLN
INTRAMUSCULAR | Status: DC | PRN
  Administered 2020-11-03: 10 mL via INTRAVENOUS

## 2020-11-03 MED ORDER — OXYCODONE HCL 5 MG/5ML PO SOLN: 5.0000 mg | Freq: Once | ORAL | Status: DC | PRN

## 2020-11-03 MED ORDER — SODIUM CHLORIDE 0.9 % IV SOLN: INTRAVENOUS | Status: DC

## 2020-11-03 MED ORDER — STERILE WATER FOR IRRIGATION IR SOLN
Status: DC | PRN
  Administered 2020-11-03: 500 mL

## 2020-11-03 MED ORDER — PHENYLEPHRINE HCL (PRESSORS) 10 MG/ML IV SOLN
INTRAVENOUS | Status: DC | PRN
Start: 2020-11-03 — End: 2020-11-03
  Administered 2020-11-03 (×4): 80 ug via INTRAVENOUS

## 2020-11-03 MED ORDER — PROPOFOL 10 MG/ML IV BOLUS
INTRAVENOUS | Status: AC
  Filled 2020-11-03: qty 40

## 2020-11-03 MED ORDER — FLEET ENEMA 7-19 GM/118ML RE ENEM: 1.0000 | ENEMA | Freq: Once | RECTAL | Status: DC

## 2020-11-03 MED ORDER — PROMETHAZINE HCL 25 MG/ML IJ SOLN: 6.2500 mg | INTRAMUSCULAR | Status: DC | PRN

## 2020-11-03 MED ORDER — EPHEDRINE 5 MG/ML INJ
INTRAVENOUS | Status: AC
  Filled 2020-11-03: qty 10

## 2020-11-03 MED ORDER — FENTANYL CITRATE (PF) 100 MCG/2ML IJ SOLN
INTRAMUSCULAR | Status: DC | PRN
  Administered 2020-11-03 (×4): 50 ug via INTRAVENOUS

## 2020-11-03 MED ORDER — MEPERIDINE HCL 25 MG/ML IJ SOLN: 6.2500 mg | INTRAMUSCULAR | Status: DC | PRN

## 2020-11-03 MED ORDER — ONDANSETRON HCL 4 MG/2ML IJ SOLN
INTRAMUSCULAR | Status: AC
  Filled 2020-11-03: qty 2

## 2020-11-03 MED ORDER — CIPROFLOXACIN IN D5W 400 MG/200ML IV SOLN
INTRAVENOUS | Status: AC
  Filled 2020-11-03: qty 200

## 2020-11-03 MED ORDER — DEXAMETHASONE SODIUM PHOSPHATE 4 MG/ML IJ SOLN
INTRAMUSCULAR | Status: DC | PRN
  Administered 2020-11-03: 10 mg via INTRAVENOUS

## 2020-11-03 SURGICAL SUPPLY — 37 items
BAG DRN RND TRDRP ANRFLXCHMBR (UROLOGICAL SUPPLIES) ×2
BAG URINE DRAIN 2000ML AR STRL (UROLOGICAL SUPPLIES) ×3 IMPLANT
BLADE CLIPPER SENSICLIP SURGIC (BLADE) ×3 IMPLANT
CATH FOLEY 2WAY SLVR  5CC 16FR (CATHETERS) ×6
CATH FOLEY 2WAY SLVR 5CC 16FR (CATHETERS) ×2 IMPLANT
CATH ROBINSON RED A/P 16FR (CATHETERS) IMPLANT
CATH ROBINSON RED A/P 20FR (CATHETERS) ×3 IMPLANT
CLOTH BEACON ORANGE TIMEOUT ST (SAFETY) ×3 IMPLANT
CNTNR URN SCR LID CUP LEK RST (MISCELLANEOUS) ×4 IMPLANT
CONT SPEC 4OZ STRL OR WHT (MISCELLANEOUS) ×6
COVER BACK TABLE 60X90IN (DRAPES) ×3 IMPLANT
COVER MAYO STAND STRL (DRAPES) ×3 IMPLANT
DRAPE C-ARM 35X43 STRL (DRAPES) ×3 IMPLANT
DRSG TEGADERM 4X4.75 (GAUZE/BANDAGES/DRESSINGS) ×5 IMPLANT
DRSG TEGADERM 8X12 (GAUZE/BANDAGES/DRESSINGS) ×6 IMPLANT
GAUZE SPONGE 4X4 12PLY STRL LF (GAUZE/BANDAGES/DRESSINGS) ×1 IMPLANT
GLOVE BIO SURGEON STRL SZ7.5 (GLOVE) IMPLANT
GLOVE BIO SURGEON STRL SZ8 (GLOVE) ×6 IMPLANT
GLOVE ECLIPSE 7.5 STRL STRAW (GLOVE) ×1 IMPLANT
GLOVE SURG ENC MOIS LTX SZ6.5 (GLOVE) ×3 IMPLANT
GLOVE SURG ORTHO 8.5 STRL (GLOVE) ×3 IMPLANT
GLOVE SURG SS PI 6.5 STRL IVOR (GLOVE) IMPLANT
GOWN STRL REUS W/TWL XL LVL3 (GOWN DISPOSABLE) ×3 IMPLANT
HOLDER FOLEY CATH W/STRAP (MISCELLANEOUS) ×3 IMPLANT
IMPL SPACEOAR VUE SYSTEM (Spacer) IMPLANT
IMPLANT SPACEOAR VUE SYSTEM (Spacer) ×3 IMPLANT
IV NS 1000ML (IV SOLUTION) ×3
IV NS 1000ML BAXH (IV SOLUTION) ×2 IMPLANT
KIT TURNOVER CYSTO (KITS) ×3 IMPLANT
MARKER SKIN DUAL TIP RULER LAB (MISCELLANEOUS) ×3 IMPLANT
PACK CYSTO (CUSTOM PROCEDURE TRAY) ×3 IMPLANT
SUT BONE WAX W31G (SUTURE) IMPLANT
SYR 10ML LL (SYRINGE) ×3 IMPLANT
TOWEL OR 17X26 10 PK STRL BLUE (TOWEL DISPOSABLE) ×3 IMPLANT
UNDERPAD 30X36 HEAVY ABSORB (UNDERPADS AND DIAPERS) ×6 IMPLANT
WATER STERILE IRR 500ML POUR (IV SOLUTION) ×3 IMPLANT
iseed agx100 ×67 IMPLANT

## 2020-11-03 NOTE — Op Note (Signed)
Preoperative diagnosis: Clinical stage TI C adenocarcinoma the prostate   Postoperative diagnosis: Same   Procedure: I-125 prostate seed implantation, SpaceOAR placement, flexible cystoscopy  Surgeon: Lillette Boxer. Zahid Carneiro M.D.  Radiation Oncologist: Tyler Pita, M.D.  Anesthesia: Gen.   Indications: Patient  was diagnosed with clinical stage TI C prostate cancer. We had extensive discussion with him about treatment options versus. He elected to proceed with seed implantation. He underwent consultation my office as well as with Dr. Tammi Klippel. He appeared to understand the advantages disadvantages potential risks of this treatment option. Full informed consent has been obtained.   Technique and findings: Patient was brought the operating room where he had successful induction of general anesthesia. He was placed in dorso-lithotomy position and prepped and draped in usual manner. Appropriate surgical timeout was performed. Radiation oncology department placed a transrectal ultrasound probe anchoring stand. Foley catheter with contrast in the balloon was inserted without difficulty. Anchoring needles were placed within the prostate. Rectal tube was placed. Real-time contouring of the urethra prostate and rectum were performed and the dosing parameters were established. Targeted dose was 145 gray.  I was then called  to the operating suite suite for placement of the needles. A second timeout was performed. All needle passage was done with real-time transrectal ultrasound guidance with the sagittal plane. A total of 19 needles were placed.  58 active seeds were implanted.  I then proceeded with placement of SpaceOAR by introducing a needle with the bevel angled inferiorly approximately 2 cm superior to the anus. This was angled downward and under direct ultrasound was placed within the space between the prostatic capsule and rectum. This was confirmed with a small amount of sterile saline injected and  this was performed under direct ultrasound. I then attached the SpaceOAR to the needle and injected this in the space between the prostate and rectum with good placement noted. The Foley catheter was removed and flexible cystoscopy failed to show any seeds outside the prostate.  The patient was brought to recovery room in stable condition, having tolerated the procedure well.Marland Kitchen

## 2020-11-03 NOTE — H&P (Signed)
H&P  Chief Complaint:  Prostate cancer  History of Present Illness:  60 year old male presents for brachytherapy for management of favorable intermediate risk prostate cancer.  Past Medical History:  Diagnosis Date  . Anxiety   . Arthritis   . Cancer [facility])    right renal cancer, right radical nephrectomy 2004  . Chronic back pain   . Chronic hip pain, left   . CKD (chronic kidney disease) stage 3, GFR 30-59 ml/min (HCC)    ckd stage 3 b   . Complication of anesthesia    1 seizure after anesthesia 1999 or 2000 none since , recent surgeries no seizures  . Coronary artery disease   . Depression   . GERD (gastroesophageal reflux disease)   . Gout    no recent flares  . Heart disease   . High blood pressure   . History of PTCA 2 2004   2 stents  . History of stomach ulcers   . Hypercholesteremia   . Hypertension   . Kidney disease   . Myocardial infarction ([place]) 2004  . Prostate cancer ([place])    3/12 cores upt to 40% grade 2 cancer. Planning brachytherapy (2021)  . Proteinuria   . Renal insufficiency   . Tennis elbow     Past Surgical History:  Procedure Laterality Date  . COLONOSCOPY WITH PROPOFOL N/A 10/28/2020   Procedure: COLONOSCOPY WITH PROPOFOL;  Surgeon: Harvel Quale, MD;  Location[facility];  Service: Gastroenterology;  Laterality: N/A;  2:30  . coloscopy  10/28/2020   removed 7 polyps done at [facility]  . Heart catherization  2004   2 stents to heart cypher left circulflex  . HERNIA REPAIR  1999   left groin bilateral  . HIP SURGERY Left 1999 or 2000   bond graft  . Kidney removed Right 2003 or 2004   for cancer  . POLYPECTOMY  10/28/2020   Procedure: POLYPECTOMY INTESTINAL;  Surgeon: Harvel Quale, MD;  Location: [facility];  Service: Gastroenterology;;  . PROSTATE BIOPSY  2021    Home Medications:    Allergies:  Allergies  Allergen Reactions  . Other     Pt. Reports having seizures after anesthesia once 1999 or 2000  none since  . Keflex [Cephalexin] Rash    Family History  Problem Relation Age of Onset  . Heart disease Other   . Cancer Other   . Kidney disease Other   . Heart disease Father   . Prostate cancer Father   . Heart disease Mother   . Lung cancer Mother   . Kidney cancer Mother   . Prostate cancer Maternal Uncle   . Bladder Cancer Maternal Grandfather   . Breast cancer Neg Hx   . Colon cancer Neg Hx   . Pancreatic cancer Neg Hx     Social History:  reports that he quit smoking about 17 years ago. His smoking use included cigarettes. He started smoking about 49 years ago. He has a 48.00 pack-year smoking history. He has never used smokeless tobacco. He reports current alcohol use. He reports that he does not use drugs.  ROS: A complete review of systems was performed.  All systems are negative except for pertinent findings as noted.  Physical Exam:  Vital signs in last 24 hours: BP (!) 168/94   Pulse 78   Temp 98.8 F (37.1 C) (Oral)   Resp 16   Ht 6' (1.829 m)   Wt 99.1 kg  SpO2 99%   BMI 29.63 kg/m  Constitutional:  Alert and oriented, No acute distress Cardiovascular: Regular rate  Respiratory: Normal respiratory effort GI: Abdomen is soft, nontender, nondistended, no abdominal masses. No CVAT.  Genitourinary: Normal male phallus, testes are descended bilaterally and non-tender and without masses, scrotum is normal in appearance without lesions or masses, perineum is normal on inspection. Lymphatic: No lymphadenopathy Neurologic: Grossly intact, no focal deficits Psychiatric: Normal mood and affect  Laboratory Data:  No results for input(s): WBC, HGB, HCT, PLT in the last 72 hours.  No results for input(s): NA, K, CL, GLUCOSE, BUN, CALCIUM, CREATININE in the last 72 hours.  Invalid input(s): CO3   Results for orders placed or performed during the hospital encounter of 11/03/20 (from the past 24 hour(s))  Resp Panel by RT-PCR (Flu A&B, Covid) Nasopharyngeal  Swab     Status: None   Collection Time: 11/03/20  7:19 AM   Specimen: Nasopharyngeal Swab; Nasopharyngeal(NP) swabs in vial transport medium  Result Value Ref Range   SARS Coronavirus 2 by RT PCR NEGATIVE NEGATIVE   Influenza A by PCR NEGATIVE NEGATIVE   Influenza B by PCR NEGATIVE NEGATIVE   Recent Results (from the past 240 hour(s))  SARS CORONAVIRUS 2 (TAT 6-24 HRS) Nasopharyngeal Nasopharyngeal Swab     Status: None   Collection Time: 10/27/20  8:05 AM   Specimen: Nasopharyngeal Swab  Result Value Ref Range Status   SARS Coronavirus 2 NEGATIVE NEGATIVE Final    Comment: (NOTE) SARS-CoV-2 target nucleic acids are NOT DETECTED.  The SARS-CoV-2 RNA is generally detectable in upper and lower respiratory specimens during the acute phase of infection. Negative results do not preclude SARS-CoV-2 infection, do not rule out co-infections with other pathogens, and should not be used as the sole basis for treatment or other patient management decisions. Negative results must be combined with clinical observations, patient history, and epidemiological information. The expected result is Negative.  Fact Sheet for Patients: SugarRoll.be  Fact Sheet for Healthcare Providers: https://www.woods-mathews.com/  This test is not yet approved or cleared by the [place] FDA and  has been authorized for detection and/or diagnosis of SARS-CoV-2 by FDA under an Emergency Use Authorization (EUA). This EUA will remain  in effect (meaning this test can be used) for the duration of the COVID-19 declaration under Se ction 564(b)(1) of the Act, 21 U.S.C. section 360bbb-3(b)(1), unless the authorization is terminated or revoked sooner.  Performed at [facility], [place] [address], [place], [place] [address]   Resp Panel by RT-PCR (Flu A&B, Covid) Nasopharyngeal Swab     Status: None   Collection Time: 11/03/20  7:19 AM   Specimen: Nasopharyngeal Swab;  Nasopharyngeal(NP) swabs in vial transport medium  Result Value Ref Range Status   SARS Coronavirus 2 by RT PCR NEGATIVE NEGATIVE Final    Comment: (NOTE) SARS-CoV-2 target nucleic acids are NOT DETECTED.  The SARS-CoV-2 RNA is generally detectable in upper respiratory specimens during the acute phase of infection. The lowest concentration of SARS-CoV-2 viral copies this assay can detect is 138 copies/mL. A negative result does not preclude SARS-Cov-2 infection and should not be used as the sole basis for treatment or other patient management decisions. A negative result may occur with  improper specimen collection/handling, submission of specimen other than nasopharyngeal swab, presence of viral mutation(s) within the areas targeted by this assay, and inadequate number of viral copies(<138 copies/mL). A negative result must be combined with clinical observations, patient history, and  epidemiological information. The expected result is Negative.  Fact Sheet for Patients:  EntrepreneurPulse.com.au  Fact Sheet for Healthcare Providers:  IncredibleEmployment.be  This test is no t yet approved or cleared by the [place] FDA and  has been authorized for detection and/or diagnosis of SARS-CoV-2 by FDA under an Emergency Use Authorization (EUA). This EUA will remain  in effect (meaning this test can be used) for the duration of the COVID-19 declaration under Section 564(b)(1) of the Act, 21 U.S.C.section 360bbb-3(b)(1), unless the authorization is terminated  or revoked sooner.       Influenza A by PCR NEGATIVE NEGATIVE Final   Influenza B by PCR NEGATIVE NEGATIVE Final    Comment: (NOTE) The Xpert Xpress SARS-CoV-2/FLU/RSV plus assay is intended as an aid in the diagnosis of influenza from Nasopharyngeal swab specimens and should not be used as a sole basis for treatment. Nasal washings and aspirates are unacceptable for Xpert Xpress  SARS-CoV-2/FLU/RSV testing.  Fact Sheet for Patients: EntrepreneurPulse.com.au  Fact Sheet for Healthcare Providers: IncredibleEmployment.be  This test is not yet approved or cleared by the [place] FDA and has been authorized for detection and/or diagnosis of SARS-CoV-2 by FDA under an Emergency Use Authorization (EUA). This EUA will remain in effect (meaning this test can be used) for the duration of the COVID-19 declaration under Section 564(b)(1) of the Act, 21 U.S.C. section 360bbb-3(b)(1), unless the authorization is terminated or revoked.  Performed at [facility], [place] [address], [place], [place] [address]     Renal Function: Recent Labs    10/27/20 1023 10/29/20 1021  CREATININE 1.49* 1.51*   Estimated Creatinine Clearance: 63.4 mL/min (A) (by C-G formula based on SCr of 1.51 mg/dL (H)).  Radiologic Imaging: No results found.  Impression/Assessment:    Favorable intermediate risk prostate cancer  Plan:   I 125 brachytherapy, placement of Space OAR

## 2020-11-03 NOTE — Interval H&P Note (Signed)
History and Physical Interval Note:  11/03/2020 10:03 AM  Brandon Buck  has presented today for surgery, with the diagnosis of PROSTATE CANCER.  The various methods of treatment have been discussed with the patient and family. After consideration of risks, benefits and other options for treatment, the patient has consented to  Procedure(s) with comments: RADIOACTIVE SEED IMPLANT/BRACHYTHERAPY IMPLANT (N/A) - 90 MINS SPACE OAR INSTILLATION (N/A) CYSTOSCOPY (N/A) as a surgical intervention.  The patient's history has been reviewed, patient examined, no change in status, stable for surgery.  I have reviewed the patient's chart and labs.  Questions were answered to the patient's satisfaction.     Brandon Buck

## 2020-11-03 NOTE — Anesthesia Preprocedure Evaluation (Addendum)
Anesthesia Evaluation  Patient identified by MRN, date of birth, ID band Patient awake    Reviewed: Allergy & Precautions, H&P , NPO status , Patient's Chart, lab work & pertinent test results, reviewed documented beta blocker date and time   History of Anesthesia Complications (+) history of anesthetic complications  Airway Mallampati: III  TM Distance: >3 FB Neck ROM: Full    Dental  (+) Missing, Dental Advisory Given   Pulmonary neg pulmonary ROS, former smoker,    Pulmonary exam normal breath sounds clear to auscultation       Cardiovascular Exercise Tolerance: Good hypertension, Pt. on home beta blockers and Pt. on medications + CAD and + Past MI  Normal cardiovascular exam Rhythm:Regular Rate:Normal     Neuro/Psych PSYCHIATRIC DISORDERS Anxiety Depression negative neurological ROS     GI/Hepatic Neg liver ROS, GERD  Medicated,  Endo/Other  negative endocrine ROS  Renal/GU Renal disease  negative genitourinary   Musculoskeletal  (+) Arthritis ,   Abdominal   Peds  Hematology negative hematology ROS (+)   Anesthesia Other Findings   Reproductive/Obstetrics                           Anesthesia Physical  Anesthesia Plan  ASA: III  Anesthesia Plan: General   Post-op Pain Management:    Induction: Intravenous  PONV Risk Score and Plan: 3 and Ondansetron, Dexamethasone, Treatment may vary due to age or medical condition and Midazolam  Airway Management Planned: LMA  Additional Equipment: None  Intra-op Plan:   Post-operative Plan: Extubation in OR  Informed Consent: I have reviewed the patients History and Physical, chart, labs and discussed the procedure including the risks, benefits and alternatives for the proposed anesthesia with the patient or authorized representative who has indicated his/her understanding and acceptance.     Dental advisory given  Plan Discussed  with: CRNA  Anesthesia Plan Comments: (Denies CP or change in other cardiac symptoms. Due for F/U with cardiologist next month.)       Anesthesia Quick Evaluation

## 2020-11-03 NOTE — Progress Notes (Signed)
  Radiation Oncology         [phone]) [phone] ________________________________  Name: Brandon Buck MRN: [id number]  Date: 11/03/2020  DOB: 04-03-1961       Prostate Seed Implant  AN:VBTYOMA, [name], MD  No ref. provider found  DIAGNOSIS:  60 y.o. gentleman with Stage T1c adenocarcinoma of the prostate with Gleason score of 3+4, and PSA of 5.3.  PROCEDURE: Insertion of radioactive I-125 seeds into the prostate gland.  RADIATION DOSE: 145 Gy, definitive therapy.  TECHNIQUE: [name] was brought to the operating room with the urologist. He was placed in the dorsolithotomy position. He was catheterized and a rectal tube was inserted. The perineum was shaved, prepped and draped. The ultrasound probe was then introduced into the rectum to see the prostate gland.  TREATMENT DEVICE: A needle grid was attached to the ultrasound probe stand and anchor needles were placed.  3D PLANNING: The prostate was imaged in 3D using a sagittal sweep of the prostate probe. These images were transferred to the planning computer. There, the prostate, urethra and rectum were defined on each axial reconstructed image. Then, the software created an optimized 3D plan and a few seed positions were adjusted. The quality of the plan was reviewed using [facility] information for the target and the following two organs at risk:  Urethra and Rectum.  Then the accepted plan was printed and handed off to the radiation therapist.  Under my supervision, the custom loading of the seeds and spacers was carried out and loaded into sealed vicryl sleeves.  These pre-loaded needles were then placed into the needle holder.Marland Kitchen  PROSTATE VOLUME STUDY:  Using transrectal ultrasound the volume of the prostate was verified to be 35.9 cc.  SPECIAL TREATMENT PROCEDURE/SUPERVISION AND HANDLING: The pre-loaded needles were then delivered under sagittal guidance. A total of 19 needles were used to deposit 67 seeds in the prostate gland. The  individual seed activity was 0.429 mCi.  SpaceOAR:  Yes  COMPLEX SIMULATION: At the end of the procedure, an anterior radiograph of the pelvis was obtained to document seed positioning and count. Cystoscopy was performed to check the urethra and bladder.  MICRODOSIMETRY: At the end of the procedure, the patient was emitting 0.089 mR/hr at 1 meter. Accordingly, he was considered safe for hospital discharge.  PLAN: The patient will return to the radiation oncology clinic for post implant CT dosimetry in three weeks.   ________________________________  Sheral Apley Tammi Klippel, M.D.

## 2020-11-03 NOTE — Discharge Instructions (Signed)
°  Post Anesthesia Home Care Instructions ° °Activity: °Get plenty of rest for the remainder of the day. A responsible adult should stay with you for 24 hours following the procedure.  °For the next 24 hours, DO NOT: °-Drive a car °-Operate machinery °-Drink alcoholic beverages °-Take any medication unless instructed by your physician °-Make any legal decisions or sign important papers. ° °Meals: °Start with liquid foods such as gelatin or soup. Progress to regular foods as tolerated. Avoid greasy, spicy, heavy foods. If nausea and/or vomiting occur, drink only clear liquids until the nausea and/or vomiting subsides. Call your physician if vomiting continues. ° °Special Instructions/Symptoms: °Your throat may feel dry or sore from the anesthesia or the breathing tube placed in your throat during surgery. If this causes discomfort, gargle with warm salt water. The discomfort should disappear within 24 hours. ° °If you had a scopolamine patch placed behind your ear for the management of post- operative nausea and/or vomiting: ° °1. The medication in the patch is effective for 72 hours, after which it should be removed.  Wrap patch in a tissue and discard in the trash. Wash hands thoroughly with soap and water. °2. You may remove the patch earlier than 72 hours if you experience unpleasant side effects which may include dry mouth, dizziness or visual disturbances. °3. Avoid touching the patch. Wash your hands with soap and water after contact with the patch. °  °Radioactive Seed Implant Home Care Instructions ° ° °Activity:    Rest for the remainder of the day.  Do not drive or operate equipment today.  You may resume normal  activities in a few days as instructed by your physician, without risk of harmful radiation exposure to those around you, provided you follow the time and distance precautions on the Radiation Oncology Instruction Sheet. ° ° °Meals: Drink plenty of lipuids and eat light foods, such as gelatin or  soup this evening .  You may return to normal meal plan tomorrow. ° °Return °To Work: You may return to work as instructed by your physician. ° °Special °Instruction:   If any seeds are found, use tweezers to pick up seeds and place in a glass container of any kind and bring to your physician's office. ° °Call your physician if any of these symptoms occur: ° °· Persistent or heavy bleeding °· Urine stream diminishes or stops completely after catheter is removed °· Fever equal to or greater than 101 degrees F °· Cloudy urine with a strong foul odor °· Severe pain ° °You may feel some burning pain and/or hesitancy when you urinate after the catheter is removed.  These symptoms may increase over the next few weeks, but should diminish within forur to six weeks.  Applying moist heat to the lower abdomen or a hot tub bath may help relieve the pain.  If the discomfort becomes severe, please call your physician for additional medications. ° ° ° °

## 2020-11-03 NOTE — Anesthesia Procedure Notes (Signed)
Procedure Name: LMA Insertion Date/Time: 11/03/2020 11:46 AM Performed by: Justice Rocher, CRNA Pre-anesthesia Checklist: Patient identified, Emergency Drugs available, Suction available, Patient being monitored and Timeout performed Patient Re-evaluated:Patient Re-evaluated prior to induction Oxygen Delivery Method: Circle system utilized Preoxygenation: Pre-oxygenation with 100% oxygen Induction Type: IV induction Ventilation: Mask ventilation without difficulty LMA: LMA inserted LMA Size: 5.0 Number of attempts: 1 Airway Equipment and Method: Bite block Placement Confirmation: positive ETCO2,  breath sounds checked- equal and bilateral and CO2 detector Tube secured with: Tape Dental Injury: Teeth and Oropharynx as per pre-operative assessment  Comments: Teeth poor, chipped, decay, cracked

## 2020-11-03 NOTE — Transfer of Care (Signed)
Immediate Anesthesia Transfer of Care Note  Patient: Brandon Buck  Procedure(s) Performed: Procedure(s) (LRB): RADIOACTIVE SEED IMPLANT/BRACHYTHERAPY IMPLANT (N/A) SPACE OAR INSTILLATION (N/A) CYSTOSCOPY (N/A)  Patient Location: PACU  Anesthesia Type: General  Level of Consciousness: awake, sedated, patient cooperative and responds to stimulation  Airway & Oxygen Therapy: Patient Spontanous Breathing and Patient connected to [place] 02 and soft FM   Post-op Assessment: Report given to PACU RN, Post -op Vital signs reviewed and stable and Patient moving all extremities  Post vital signs: Reviewed and stable  Complications: No apparent anesthesia complications

## 2020-11-04 ENCOUNTER — Encounter (HOSPITAL_BASED_OUTPATIENT_CLINIC_OR_DEPARTMENT_OTHER): Payer: Self-pay | Admitting: Urology

## 2020-11-07 LAB — HM COLONOSCOPY

## 2020-11-12 ENCOUNTER — Other Ambulatory Visit: Payer: Self-pay | Admitting: Family Medicine

## 2020-11-12 DIAGNOSIS — F411 Generalized anxiety disorder: Secondary | ICD-10-CM

## 2020-11-17 ENCOUNTER — Encounter (INDEPENDENT_AMBULATORY_CARE_PROVIDER_SITE_OTHER): Payer: Self-pay | Admitting: *Deleted

## 2020-11-17 NOTE — Anesthesia Postprocedure Evaluation (Signed)
Anesthesia Post Note  Patient: Brandon Buck  Procedure(s) Performed: RADIOACTIVE SEED IMPLANT/BRACHYTHERAPY IMPLANT (N/A Prostate) SPACE OAR INSTILLATION (N/A Prostate) CYSTOSCOPY (N/A Bladder)     Patient location during evaluation: PACU Anesthesia Type: General Level of consciousness: sedated and patient cooperative Pain management: pain level controlled Vital Signs Assessment: post-procedure vital signs reviewed and stable Respiratory status: spontaneous breathing Cardiovascular status: stable Anesthetic complications: no   No complications documented.  Last Vitals:  Vitals:   11/03/20 1345 11/03/20 1354  BP: (!) 171/92   Pulse: 95 97  Resp: 20 17  Temp:  36.5 C  SpO2: 95% 97%    Last Pain:  Vitals:   11/04/20 1132  TempSrc:   PainSc: [place]

## 2020-11-18 ENCOUNTER — Telehealth: Payer: Self-pay

## 2020-11-18 NOTE — Telephone Encounter (Signed)
Pt called stating that he woke up from a nap with brown discharge coming from his penis and the bottom of his penis purple and black. Pt stated that he has had no other issues until he noticed this today. Per Dr. Diona Fanti the bruising may happen due to the pooling of blood from the prostate and the discharge from penis is normal as well. Pt made aware and voiced understanding.

## 2020-11-19 ENCOUNTER — Telehealth: Payer: Self-pay | Admitting: *Deleted

## 2020-11-19 NOTE — Telephone Encounter (Signed)
Called patient to remind of post seed appts. for 11-20-20, lvm for a return call

## 2020-11-20 ENCOUNTER — Encounter: Payer: Self-pay | Admitting: Urology

## 2020-11-20 ENCOUNTER — Ambulatory Visit
Admission: RE | Admit: 2020-11-20 | Discharge: 2020-11-20 | Disposition: A | Discharge status: Home / Self Care | Payer: PPO | Source: Ambulatory Visit | Attending: Radiation Oncology | Admitting: Radiation Oncology

## 2020-11-20 ENCOUNTER — Encounter: Payer: Self-pay | Admitting: Medical Oncology

## 2020-11-20 ENCOUNTER — Other Ambulatory Visit: Payer: Self-pay

## 2020-11-20 ENCOUNTER — Ambulatory Visit
Admission: RE | Admit: 2020-11-20 | Discharge: 2020-11-20 | Disposition: A | Discharge status: Home / Self Care | Payer: PPO | Source: Ambulatory Visit | Attending: Urology | Admitting: Urology

## 2020-11-20 VITALS — BP 156/76 | HR 80 | Temp 97.9°F | Resp 20 | Ht 72.0 in | Wt 220.2 lb

## 2020-11-20 DIAGNOSIS — Z923 Personal history of irradiation: Secondary | ICD-10-CM | POA: Diagnosis not present

## 2020-11-20 DIAGNOSIS — C61 Malignant neoplasm of prostate: Secondary | ICD-10-CM | POA: Diagnosis not present

## 2020-11-20 DIAGNOSIS — Z7982 Long term (current) use of aspirin: Secondary | ICD-10-CM | POA: Insufficient documentation

## 2020-11-20 DIAGNOSIS — Z79899 Other long term (current) drug therapy: Secondary | ICD-10-CM | POA: Insufficient documentation

## 2020-11-20 NOTE — Progress Notes (Signed)
  Radiation Oncology         [phone] ________________________________  Name: Brandon Buck MRN: [id number]  Date: 11/20/2020  DOB: 01-07-1961  COMPLEX SIMULATION NOTE  NARRATIVE:  The patient was brought to the [facility] today following prostate seed implantation approximately one month ago.  Identity was confirmed.  All relevant records and images related to the planned course of therapy were reviewed.  Then, the patient was set-up supine.  CT images were obtained.  The CT images were loaded into the planning software.  Then the prostate and rectum were contoured.  Treatment planning then occurred.  The implanted iodine 125 seeds were identified by the physics staff for projection of radiation distribution  I have requested : 3D Simulation  I have requested a DVH of the following structures: Prostate and rectum.    ________________________________  Sheral Apley Tammi Klippel, M.D.

## 2020-11-20 NOTE — Progress Notes (Signed)
Patient is here for a follow-up appointment today. Patient reports  Dysuria  Some Hematuria  None Nocturia x3 Leakage Yes Urgency Some Bowels no issues Emptying bladder States that yesterday was the first day that he felt like he emptied his  Bladder.,States that today he is npt emptying his bladder Fatigue levels varies  Urine stream weak Next appointment with urologist February 15-17 unsure of day IPPS score 22 Vitals:   11/20/20 1319  BP: (!) 156/76  Pulse: 80  Resp: 20  Temp: 97.9 F (36.6 C)  SpO2: 100%  Weight: 99.9 kg  Height: 6' (1.829 m)

## 2020-11-20 NOTE — Progress Notes (Signed)
Radiation Oncology         [phone]) [phone] ________________________________  Name: ROCKY GLADDEN MRN: [id number]  Date: 11/20/2020  DOB: 02-Oct-1961  Post-Seed Follow-Up Visit Note  CC: [name], MD  Alexis Frock, MD  Diagnosis:   60 y.o. gentleman with Stage T1c adenocarcinoma of the prostate with Gleason score of 3+4, and PSA of 5.3.    ICD-10-CM   1. Malignant neoplasm of prostate ([place])  C61     Interval Since Last Radiation:  2.5 weeks 11/03/20:  Insertion of radioactive I-125 seeds into the prostate gland; 145 Gy, definitive therapy with placement of SpaceOAR VUE gel.  Narrative:  The patient returns today for routine follow-up.  He is complaining of increased urinary frequency and urinary hesitation symptoms. He filled out a questionnaire regarding urinary function today providing and overall IPSS score of 22 characterizing his symptoms as severe with increased frequency, urgency, weak stream, intermittency and nocturia x3 per night.  He specifically denies dysuria, gross hematuria or incontinence.  He has had some post void dribble.  His pre-implant score was 10. He denies any abdominal pain or bowel symptoms.  He reports a healthy appetite and is maintaining his weight.  He denies any significant impact on his energy level but has had some mild fatigue.  Overall, he is quite pleased with his progress to date.  ALLERGIES:  is allergic to other and keflex [cephalexin].  Meds: Current Outpatient Medications  Medication Sig Dispense Refill  . allopurinol (ZYLOPRIM) 100 MG tablet TAKE 2 TABLETS BY MOUTH DAILY. (Patient taking differently: Take 200 mg by mouth daily.) 60 tablet 0  . ALPRAZolam (XANAX) 0.5 MG tablet TAKE (1) TABLET BY MOUTH TWICE DAILY AS NEEDED FOR ANXIETY 30 tablet 0  . aspirin 81 MG tablet Take 81 mg by mouth daily.    Marland Kitchen atorvastatin (LIPITOR) 80 MG tablet TAKE (1) TABLET BY MOUTH ONCE DAILY. (Patient taking differently: Take 80 mg by mouth daily.) 90 tablet  1  . diltiazem (CARDIZEM CD) 240 MG 24 hr capsule TAKE (1) CAPSULE BY MOUTH DAILY. (Patient taking differently: Take 240 mg by mouth daily.) 90 capsule 2  . ezetimibe (ZETIA) 10 MG tablet Take 10 mg by mouth daily.     . fluticasone (FLONASE) 50 MCG/ACT nasal spray Place 2 sprays into both nostrils daily. (Patient taking differently: Place 2 sprays into both nostrils as needed.) 16 g 6  . furosemide (LASIX) 20 MG tablet Take 1 tablet (20 mg total) by mouth daily. 30 tablet 3  . HYDROcodone-acetaminophen (NORCO/VICODIN) 5-325 MG tablet TAKE 1 TABLET BY MOUTH THREE TIMES DAILY AS NEEDED FOR MODERATE PAIN. 90 tablet 0  . lisinopril (ZESTRIL) 20 MG tablet Take 20 mg by mouth daily.    . metoprolol tartrate (LOPRESSOR) 50 MG tablet TAKE ONE HALF TABLET BY MOUTH TWICE DAILY. (Patient taking differently: Take 25 mg by mouth 2 (two) times daily.) 90 tablet 3  . NICOTROL 10 MG inhaler INHALE 1 CARTRIDGE INTO THE LUNGS AS NEEDED FOR SMOKING CESSATION. (Patient taking differently: Inhale 1 continuous puffing into the lungs daily as needed for smoking cessation.) 168 each 0  . nitroGLYCERIN (NITROSTAT) 0.4 MG SL tablet Place 0.4 mg under the tongue every 5 (five) minutes as needed.    . pantoprazole (PROTONIX) 40 MG tablet Take 1 tablet (40 mg total) by mouth 2 (two) times daily. (Patient taking differently: Take 40 mg by mouth daily.) 60 tablet 5   No current facility-administered medications for this encounter.  Physical Findings: In general this is a well appearing Caucasian male in no acute distress. He's alert and oriented x4 and appropriate throughout the examination. Cardiopulmonary assessment is negative for acute distress and he exhibits normal effort.   Lab Findings: Lab Results  Component Value Date   WBC 7.4 10/29/2020   HGB 15.6 10/29/2020   HCT 45.2 10/29/2020   MCV 90.9 10/29/2020   PLT 204 10/29/2020    Radiographic Findings:  Patient underwent CT imaging in our clinic for post  implant dosimetry. The CT will be reviewed by Dr. Tammi Klippel to confirm there is an adequate distribution of radioactive seeds throughout the prostate gland and ensure that there are no seeds in or near the rectum. We suspect the final radiation plan and dosimetry will show appropriate coverage of the prostate gland. He understands that we will call and inform him of any unexpected findings on further review of his imaging and dosimetry.  Impression/Plan: 60 y.o. gentleman with Stage T1c adenocarcinoma of the prostate with Gleason score of 3+4, and PSA of 5.3. The patient is recovering from the effects of radiation. His urinary symptoms should gradually improve over the next 4-6 months. We talked about this today. He is encouraged by his improvement already and is otherwise pleased with his outcome. We also talked about long-term follow-up for prostate cancer following seed implant. He understands that ongoing PSA determinations and digital rectal exams will help perform surveillance to rule out disease recurrence. He has a scheduled follow-up appointment with Dr. Diona Fanti in mid February. He understands what to expect with his PSA measures. Patient was also educated today about some of the long-term effects from radiation including a small risk for rectal bleeding and possibly erectile dysfunction. We talked about some of the general management approaches to these potential complications. However, I did encourage the patient to contact our office or return at any point if he has questions or concerns related to his previous radiation and prostate cancer.    [name], PA-C

## 2020-11-26 ENCOUNTER — Other Ambulatory Visit: Payer: Self-pay | Admitting: Family Medicine

## 2020-11-26 DIAGNOSIS — M1000 Idiopathic gout, unspecified site: Secondary | ICD-10-CM

## 2020-12-01 ENCOUNTER — Ambulatory Visit (INDEPENDENT_AMBULATORY_CARE_PROVIDER_SITE_OTHER): Payer: PPO | Admitting: Gastroenterology

## 2020-12-03 ENCOUNTER — Telehealth: Payer: Self-pay | Admitting: Pharmacist

## 2020-12-03 NOTE — Progress Notes (Addendum)
Chronic Care Management Pharmacy Assistant   Name: Brandon Buck  MRN: [id number] DOB: [birth year]  Reason for Encounter: Disease State for HTN.  Patient Questions:  1.  Have you seen any other providers since your last visit? Yes.   2.  Any changes in your medicines or health? Yes.  PCP : [name], MD   Their chronic conditions include: CKD, Gout, HTN, HLD.  Office Visits: None since 10/29/20  Consults: 11/20/20 Oncology Gwenette Greet, RN. Oncology Navigator.   Hospital: 11/03/20 Franchot Gallo, MD. For radioactive seed implant brachytherapy implant surgery. No medication changes.  Allergies:   Allergies  Allergen Reactions   Other     Pt. Reports having seizures after anesthesia once 1999 or 2000 none since   Keflex [Cephalexin] Rash    Medications: Outpatient Encounter Medications as of 12/03/2020  Medication Sig Note   allopurinol (ZYLOPRIM) 100 MG tablet TAKE 2 TABLETS BY MOUTH DAILY.    ALPRAZolam (XANAX) 0.5 MG tablet TAKE (1) TABLET BY MOUTH TWICE DAILY AS NEEDED FOR ANXIETY    aspirin 81 MG tablet Take 81 mg by mouth daily. (Patient not taking: Reported on 11/20/2020)    atorvastatin (LIPITOR) 80 MG tablet TAKE (1) TABLET BY MOUTH ONCE DAILY. (Patient taking differently: Take 80 mg by mouth daily.)    diltiazem (CARDIZEM CD) 240 MG 24 hr capsule TAKE (1) CAPSULE BY MOUTH DAILY. (Patient taking differently: Take 240 mg by mouth daily.)    ezetimibe (ZETIA) 10 MG tablet Take 10 mg by mouth daily.     fluticasone (FLONASE) 50 MCG/ACT nasal spray Place 2 sprays into both nostrils daily. (Patient not taking: Reported on 11/20/2020) 10/02/2020: Per patient as needed.   furosemide (LASIX) 20 MG tablet Take 1 tablet (20 mg total) by mouth daily. (Patient not taking: Reported on 11/20/2020)    HYDROcodone-acetaminophen (NORCO/VICODIN) 5-325 MG tablet TAKE 1 TABLET BY MOUTH THREE TIMES DAILY AS NEEDED FOR MODERATE PAIN.    lisinopril (ZESTRIL) 20 MG tablet Take  20 mg by mouth daily.    metoprolol tartrate (LOPRESSOR) 50 MG tablet TAKE ONE HALF TABLET BY MOUTH TWICE DAILY. (Patient taking differently: Take 25 mg by mouth 2 (two) times daily.)    NICOTROL 10 MG inhaler INHALE 1 CARTRIDGE INTO THE LUNGS AS NEEDED FOR SMOKING CESSATION. (Patient not taking: Reported on 11/20/2020)    nitroGLYCERIN (NITROSTAT) 0.4 MG SL tablet Place 0.4 mg under the tongue every 5 (five) minutes as needed. (Patient not taking: Reported on 11/20/2020)    pantoprazole (PROTONIX) 40 MG tablet Take 1 tablet (40 mg total) by mouth 2 (two) times daily. (Patient taking differently: Take 40 mg by mouth daily.)    No facility-administered encounter medications on file as of 12/03/2020.    Current Diagnosis: Patient Active Problem List   Diagnosis Date Noted   Abnormal abdominal CT scan 10/02/2020   Malignant neoplasm of prostate ([place]) 09/02/2020   Essential hypertension 10/05/2019   Hyperkalemia 10/05/2019   CKD (chronic kidney disease) stage 3, GFR 30-59 ml/min (HCC)    Proteinuria    History of PTCA 2    Gout    Plantar fasciitis 03/01/2012   HIP PAIN 11/11/2008   ASEPTIC NECROSIS 11/11/2008    Goals Addressed   None    Reviewed chart prior to disease state call. Spoke with patient regarding BP  Recent Office Vitals: BP Readings from Last 3 Encounters:  11/20/20 (!) 156/76  11/03/20 (!) 171/92  10/29/20 (!) 185/82  Pulse Readings from Last 3 Encounters:  11/20/20 80  11/03/20 97  10/29/20 98    Wt Readings from Last 3 Encounters:  11/20/20 220 lb 3.2 oz (99.9 kg)  11/03/20 218 lb 8 oz (99.1 kg)  10/29/20 218 lb (98.9 kg)     Kidney Function Lab Results  Component Value Date/Time   CREATININE 1.51 (H) 10/29/2020 10:21 AM   CREATININE 1.49 (H) 10/27/2020 10:23 AM   CREATININE 1.76 (H) 08/29/2020 11:27 AM   CREATININE 2.12 (H) 01/31/2020 08:19 AM   GFRNONAA 53 (L) 10/29/2020 10:21 AM   GFRNONAA 41 (L) 08/29/2020 11:27 AM   GFRAA 48 (L) 08/29/2020  11:27 AM    BMP Latest Ref Rng & Units 10/29/2020 10/27/2020 08/29/2020  Glucose 70 - 99 mg/dL 79 90 94  BUN 6 - 20 mg/dL 15 14 16   Creatinine 0.61 - 1.24 mg/dL 1.51(H) 1.49(H) 1.76(H)  BUN/Creat Ratio 6 - 22 (calc) - - 9  Sodium 135 - 145 mmol/L 140 136 140  Potassium 3.5 - 5.1 mmol/L 4.1 4.1 4.6  Chloride 98 - 111 mmol/L 107 104 103  CO2 22 - 32 mmol/L 23 22 26   Calcium 8.9 - 10.3 mg/dL 9.8 9.2 9.2    Current antihypertensive regimen:  Diltiazem 240 mg daily Metoprolol tartrate 50 mg  Lisinopril 20 mg  How often are you checking your Blood Pressure? several times per month   Current home BP readings: Patients stated he doesn't write his blood pressure readings down but he will start and will call me back with some readings for the pharmacist.   What recent interventions/DTPs have been made by any provider to improve Blood Pressure control since last CPP Visit: None.  Any recent hospitalizations or ED visits since last visit with CPP? Yes, 11/03/20 for a radioactive seed implant brachytherapy implant.  What diet changes have been made to improve Blood Pressure Control?  Patient stated he has a big appetite Patient stated he eats a balanced diet. Patient stated he drinks about 1/2 cup of coffee, some sodas and water as well.   What exercise is being done to improve your Blood Pressure Control?  Patient stated him and his wife does the house duties. He stated he walks when the weather permits.   Adherence Review: Is the patient currently on ACE/ARB medication? Yes, Lisinopril 20 mg.  Does the patient have >5 day gap between last estimated fill dates? No  Patient stated he doesn't have any concerns with his medication at this time.    Follow-Up:  Pharmacist Review   Charlann Lange, RMA Clinical Pharmacist Assistant [phone]  10 minutes spent in review, coordination, and documentation.  Reviewed by: Beverly Milch, PharmD Clinical Pharmacist [facility] [phone]

## 2020-12-04 ENCOUNTER — Telehealth: Payer: Self-pay | Admitting: Radiation Oncology

## 2020-12-04 NOTE — Telephone Encounter (Signed)
Received voicemail message from patient requesting return call. Phoned patient back to inquire. Patient explains he had a prostate seed implant approximately four weeks ago. Patient goes onto explain that today he noticed bilateral ankle swelling and questions if he can resume his Lasix. Patient confirms he hasn't taken his Lasix since the seed implant. Stressed with his history of CHF its very important to take Lasix as directed. Confirmed the Lasix will increase his urinary frequency . Explained frequency and urgency are expected after a seed implant but should begin to improve in coming weeks. Answered all patient questions to the best of my ability. Patient confirms he will take his Lasix and present to the ED should he develop chest pain or heaviness.

## 2020-12-09 ENCOUNTER — Ambulatory Visit (INDEPENDENT_AMBULATORY_CARE_PROVIDER_SITE_OTHER): Payer: PPO | Admitting: Urology

## 2020-12-09 ENCOUNTER — Other Ambulatory Visit: Payer: Self-pay

## 2020-12-09 ENCOUNTER — Encounter: Payer: Self-pay | Admitting: Urology

## 2020-12-09 VITALS — BP 164/72 | HR 98 | Temp 98.2°F | Ht 72.0 in | Wt 222.0 lb

## 2020-12-09 DIAGNOSIS — C61 Malignant neoplasm of prostate: Secondary | ICD-10-CM | POA: Diagnosis not present

## 2020-12-09 DIAGNOSIS — N138 Other obstructive and reflux uropathy: Secondary | ICD-10-CM

## 2020-12-09 DIAGNOSIS — N401 Enlarged prostate with lower urinary tract symptoms: Secondary | ICD-10-CM | POA: Diagnosis not present

## 2020-12-09 DIAGNOSIS — K4090 Unilateral inguinal hernia, without obstruction or gangrene, not specified as recurrent: Secondary | ICD-10-CM

## 2020-12-09 DIAGNOSIS — R300 Dysuria: Secondary | ICD-10-CM

## 2020-12-09 DIAGNOSIS — R3915 Urgency of urination: Secondary | ICD-10-CM

## 2020-12-09 LAB — URINALYSIS, ROUTINE W REFLEX MICROSCOPIC
Bilirubin, UA: NEGATIVE
Glucose, UA: NEGATIVE
Ketones, UA: NEGATIVE
Leukocytes,UA: NEGATIVE
Nitrite, UA: NEGATIVE
Specific Gravity, UA: 1.025 (ref 1.005–1.030)
Urobilinogen, Ur: 1 mg/dL (ref 0.2–1.0)
pH, UA: 5.5 (ref 5.0–7.5)

## 2020-12-09 LAB — MICROSCOPIC EXAMINATION
Renal Epithel, UA: NONE SEEN /hpf
WBC, UA: NONE SEEN /hpf (ref 0–5)

## 2020-12-09 MED ORDER — TAMSULOSIN HCL 0.4 MG PO CAPS
0.4000 mg | ORAL_CAPSULE | Freq: Every day | ORAL | 11 refills | Status: DC
Start: 2020-12-09 — End: 2023-02-07

## 2020-12-09 NOTE — Progress Notes (Signed)
H&P  Chief Complaint: PCa  History of Present Illness:   6.29.2021: 60 year old male sent by Dr. Dennard Schaumann for evaluation of elevated PSA.  PSA was 5.3 on 4.8.2021.  In 2017, PSA was 3.10.  He does have a history of renal cell carcinoma and is status post remote right radical nephrectomy.  There is a strong family history of prostate cancer, in his father and grandfather.  Apparently they both died of the illness.  30-Jun-2020: This 60 year old male presents for prostate cancer consultation. He underwent ultrasound and biopsy of his prostate on 7.27.2021.  Prostate volume 34.7 mL, PSA 5.3, PSAD 0.15.  3/12 cores revealed adenocarcinoma as follows:  Left apex lateral GS 3+3 and 95% of core Right apex medial GS 3+4 and 40% of core Right mid lateral, GS 3+3 in 10% of core  10.7.2021: 1 - Moderate Risk prostate cancer - 3/12 cores up to 40% Grade 2 cancer by BX 04/2020 on eval PSA 5.3. TRUS 61mL, no median. Orrgi DX by [facility] in [place]. +FHX prostate cancer.   2 - Rigth Renal CAncer - s/p open right radical nephrectomy 2004 by Rosana Hoes. No recurrence with surveillance.   PMH sig for CAD/MI/Stent (ASA only, follow [facility]), HTN, disability. His PCP is Jenna Luo MD. I operated on his father in law years ago.   Today " Brandon Buck " is seen as new patient to discuss possible surgical options for his prostate cancer.   2.15.2021: Bayan is here today for f/u approximately 1 month after seed placement. He experienced significant frequency, urgency, dribbling, pain, and incomplete emptying - He described his urinary pattern as follows (onset - 2.8.2022): immediately following urination he feels urgency, he will attempt to urinate, he will feel as though he is passing a piece of glass, and then he will dribble for a moment. He notes recent onset of testicular tenderness (R) that he relates to his hernia.  IPSS Questionnaire (AUA-7): Over the past month.   1)  How often have you had a  sensation of not emptying your bladder completely after you finish urinating?  4 - More than half the time  2)  How often have you had to urinate again less than two hours after you finished urinating? 5 - Almost always  3)  How often have you found you stopped and started again several times when you urinated?  5 - Almost always  4) How difficult have you found it to postpone urination?  5 - Almost always  5) How often have you had a weak urinary stream?  5 - Almost always  6) How often have you had to push or strain to begin urination?  4 - More than half the time  7) How many times did you most typically get up to urinate from the time you went to bed until the time you got up in the morning?  4 - 4 times  Total score:  0-7 mildly symptomatic   8-19 moderately symptomatic   20-35 severely symptomatic  IPSS: 32 QoL Score: 4    Past Medical History:  Diagnosis Date  . Anxiety   . Arthritis   . Cancer [facility])    right renal cancer, right radical nephrectomy 2004  . Chronic back pain   . Chronic hip pain, left   . CKD (chronic kidney disease) stage 3, GFR 30-59 ml/min (HCC)    ckd stage 3 b   . Complication of anesthesia    1 seizure after anesthesia 1999  or 2000 none since , recent surgeries no seizures  . Coronary artery disease   . Depression   . GERD (gastroesophageal reflux disease)   . Gout    no recent flares  . Heart disease   . High blood pressure   . History of PTCA 2 2004   2 stents  . History of stomach ulcers   . Hypercholesteremia   . Hypertension   . Kidney disease   . Myocardial infarction ([place]) 2004  . Prostate cancer ([place])    3/12 cores upt to 40% grade 2 cancer. Planning brachytherapy (2021)  . Proteinuria   . Renal insufficiency   . Tennis elbow     Past Surgical History:  Procedure Laterality Date  . COLONOSCOPY WITH PROPOFOL N/A 10/28/2020   Procedure: COLONOSCOPY WITH PROPOFOL;  Surgeon: Harvel Quale, MD;  [facility];   Service: Gastroenterology;  Laterality: N/A;  2:30  . coloscopy  10/28/2020   removed 7 polyps done at [facility]  . CYSTOSCOPY N/A 11/03/2020   Procedure: CYSTOSCOPY;  Surgeon: Franchot Gallo, MD;  [facility];  Service: Urology;  Laterality: N/A;  . Heart catherization  2004   2 stents to heart cypher left circulflex  . HERNIA REPAIR  1999   left groin bilateral  . HIP SURGERY Left 1999 or 2000   bond graft  . Kidney removed Right 2003 or 2004   for cancer  . POLYPECTOMY  10/28/2020   Procedure: POLYPECTOMY INTESTINAL;  Surgeon: Harvel Quale, MD;  [facility];  Service: Gastroenterology;;  . PROSTATE BIOPSY  2021  . RADIOACTIVE SEED IMPLANT N/A 11/03/2020   Procedure: RADIOACTIVE SEED IMPLANT/BRACHYTHERAPY IMPLANT;  Surgeon: Franchot Gallo, MD;  [facility];  Service: Urology;  Laterality: N/A;  90 MINS  . SPACE OAR INSTILLATION N/A 11/03/2020   Procedure: SPACE OAR INSTILLATION;  Surgeon: Franchot Gallo, MD;  Location: [facility];  Service: Urology;  Laterality: N/A;    Home Medications:  Allergies as of 12/09/2020      Reactions   Other    Pt. Reports having seizures after anesthesia once 1999 or 2000 none since   Keflex [cephalexin] Rash      Medication List       Accurate as of December 09, 2020  3:21 PM. If you have any questions, ask your nurse or doctor.        allopurinol 100 MG tablet Commonly known as: ZYLOPRIM TAKE 2 TABLETS BY MOUTH DAILY.   ALPRAZolam 0.5 MG tablet Commonly known as: XANAX TAKE (1) TABLET BY MOUTH TWICE DAILY AS NEEDED FOR ANXIETY   aspirin 81 MG tablet Take 81 mg by mouth daily.   atorvastatin 80 MG tablet Commonly known as: LIPITOR TAKE (1) TABLET BY MOUTH ONCE DAILY. What changed: See the new instructions.   diltiazem 240 MG 24 hr capsule Commonly known as: CARDIZEM CD TAKE (1) CAPSULE BY MOUTH DAILY. What changed: See the new  instructions.   ezetimibe 10 MG tablet Commonly known as: ZETIA Take 10 mg by mouth daily.   fluticasone 50 MCG/ACT nasal spray Commonly known as: FLONASE Place 2 sprays into both nostrils daily.   furosemide 20 MG tablet Commonly known as: LASIX Take 1 tablet (20 mg total) by mouth daily.   HYDROcodone-acetaminophen 5-325 MG tablet Commonly known as: NORCO/VICODIN TAKE 1 TABLET BY MOUTH THREE TIMES DAILY AS NEEDED FOR MODERATE PAIN.   lisinopril 20 MG tablet Commonly  known as: ZESTRIL Take 20 mg by mouth daily.   metoprolol tartrate 50 MG tablet Commonly known as: LOPRESSOR TAKE ONE HALF TABLET BY MOUTH TWICE DAILY.   Nicotrol 10 MG inhaler Generic drug: nicotine INHALE 1 CARTRIDGE INTO THE LUNGS AS NEEDED FOR SMOKING CESSATION.   nitroGLYCERIN 0.4 MG SL tablet Commonly known as: NITROSTAT Place 0.4 mg under the tongue every 5 (five) minutes as needed.   pantoprazole 40 MG tablet Commonly known as: PROTONIX Take 1 tablet (40 mg total) by mouth 2 (two) times daily. What changed: when to take this       Allergies:  Allergies  Allergen Reactions  . Other     Pt. Reports having seizures after anesthesia once 1999 or 2000 none since  . Keflex [Cephalexin] Rash    Family History  Problem Relation Age of Onset  . Heart disease Other   . Cancer Other   . Kidney disease Other   . Heart disease Father   . Prostate cancer Father   . Heart disease Mother   . Lung cancer Mother   . Kidney cancer Mother   . Prostate cancer Maternal Uncle   . Bladder Cancer Maternal Grandfather   . Breast cancer Neg Hx   . Colon cancer Neg Hx   . Pancreatic cancer Neg Hx     Social History:  reports that he quit smoking about 17 years ago. His smoking use included cigarettes. He started smoking about 49 years ago. He has a 48.00 pack-year smoking history. He has never used smokeless tobacco. He reports current alcohol use. He reports that he does not use drugs.  ROS: A complete  review of systems was performed.  All systems are negative except for pertinent findings as noted.  Physical Exam:  Vital signs in last 24 hours: There were no vitals taken for this visit. Constitutional:  Alert and oriented, No acute distress Cardiovascular: Regular rate  Respiratory: Normal respiratory effort GI: Abdomen is soft, nontender, nondistended, no abdominal masses. No CVAT. Hernia (R) Genitourinary: Normal male phallus, testes are descended bilaterally and non-tender and without masses, scrotum is normal in appearance without lesions or masses, perineum is normal on inspection. Neurologic: Grossly intact, no focal deficits Psychiatric: Normal mood and affect  I have reviewed prior pt notes  I have reviewed notes from referring/previous physicians  I have reviewed urinalysis results  I have reviewed prior PSA results    Impression/Assessment:   Adenocarcinoma the prostate-just over a month following brachytherapy with worsening lower urinary tract symptoms  BPH with lower urinary tract symptoms, exacerbated by brachytherapy  Plan:  1. Pt started on tamsulosin and advised to take AZO prn.  2. He was advised to reduce his caffeine intake and to avoid spicy foods.  3. Pt referred to general surgeon for next available hernia repair consultation @ [facility].  4. F/U in 3 months for OV, PSA, and symptom recheck.  CC: Dr. Jenna Luo

## 2020-12-09 NOTE — Progress Notes (Signed)
Urological Symptom Review  Patient is experiencing the following symptoms: Frequent urination Hard to postpone urination Burning/pain with urination Get up at night to urinate Leakage of urine Stream starts and stops Trouble starting stream Have to strain to urinate Weak stream Penile pain (male only)    Review of Systems  Gastrointestinal (upper)  : Negative for upper GI symptoms  Gastrointestinal (lower) : Constipation  Constitutional : Fatigue  Skin: Negative for skin symptoms  Eyes: Negative for eye symptoms  Ear/Nose/Throat : Sinus problems  Hematologic/Lymphatic: Negative for Hematologic/Lymphatic symptoms  Cardiovascular : Leg swelling Chest pain  Respiratory : Shortness of breath  Endocrine: Negative for endocrine symptoms  Musculoskeletal: Back pain Joint pain  Neurological: Headaches Negative for neurological symptoms  Psychologic: Depression Anxiety

## 2020-12-12 ENCOUNTER — Telehealth: Payer: Self-pay | Admitting: Radiation Oncology

## 2020-12-12 NOTE — Telephone Encounter (Signed)
Received voicemail message from patient requesting return call with CT results. Phoned patient back to inquire. Explained the post seed CT done was to ensure all the radioactive seeds implanted were/are evenly distributed. Explained to the patient his CT was normal meaning all the seeds are evenly distributed. Explained the CT done on 11/20/20 was not a diagnostic CT. Patient verbalized understanding and appreciation for the call back.

## 2020-12-15 ENCOUNTER — Ambulatory Visit
Admission: RE | Admit: 2020-12-15 | Discharge: 2020-12-15 | Disposition: A | Discharge status: Home / Self Care | Payer: PPO | Source: Ambulatory Visit | Attending: Radiation Oncology | Admitting: Radiation Oncology

## 2020-12-15 ENCOUNTER — Encounter: Payer: Self-pay | Admitting: Radiation Oncology

## 2020-12-15 DIAGNOSIS — C61 Malignant neoplasm of prostate: Secondary | ICD-10-CM | POA: Insufficient documentation

## 2020-12-15 NOTE — Progress Notes (Signed)
  Radiation Oncology         [phone] ________________________________  Name: CORBY VANDENBERGHE MRN: [id number]  Date: 12/15/2020  DOB: 1961-06-10  3D Planning Note   Prostate Brachytherapy Post-Implant Dosimetry  Diagnosis: 60 y.o. gentleman with Stage T1c adenocarcinoma of the prostate with Gleason score of 3+4, and PSA of 5.3.  Narrative: On a previous date, NADIR VASQUES returned following prostate seed implantation for post implant planning. He underwent CT scan complex simulation to delineate the three-dimensional structures of the pelvis and demonstrate the radiation distribution.  Since that time, the seed localization, and complex isodose planning with dose volume histograms have now been completed.  Results:   Prostate Coverage - The dose of radiation delivered to the 90% or more of the prostate gland (D90) was 111.24% of the prescription dose. This exceeds our goal of greater than 90%. Rectal Sparing - The volume of rectal tissue receiving the prescription dose or higher was 0.08 cc. This falls under our thresholds tolerance of 1.0 cc.  Impression: The prostate seed implant appears to show adequate target coverage and appropriate rectal sparing.  Plan:  The patient will continue to follow with urology for ongoing PSA determinations. I would anticipate a high likelihood for local tumor control with minimal risk for rectal morbidity.  ________________________________  Sheral Apley Tammi Klippel, M.D.

## 2020-12-16 ENCOUNTER — Other Ambulatory Visit: Payer: Self-pay | Admitting: Family Medicine

## 2020-12-16 DIAGNOSIS — F411 Generalized anxiety disorder: Secondary | ICD-10-CM

## 2020-12-17 NOTE — Telephone Encounter (Signed)
Ok to refill??  Last office visit 08/29/2020.  Last refill 11/13/2020 on both.

## 2020-12-24 ENCOUNTER — Ambulatory Visit: Payer: PPO | Admitting: Cardiology

## 2020-12-24 ENCOUNTER — Other Ambulatory Visit: Payer: Self-pay

## 2020-12-24 ENCOUNTER — Encounter: Payer: Self-pay | Admitting: Cardiology

## 2020-12-24 VITALS — BP 162/84 | HR 112 | Ht 72.0 in | Wt 219.0 lb

## 2020-12-24 DIAGNOSIS — R0602 Shortness of breath: Secondary | ICD-10-CM

## 2020-12-24 DIAGNOSIS — I25118 Atherosclerotic heart disease of native coronary artery with other forms of angina pectoris: Secondary | ICD-10-CM | POA: Diagnosis not present

## 2020-12-24 DIAGNOSIS — I10 Essential (primary) hypertension: Secondary | ICD-10-CM | POA: Diagnosis not present

## 2020-12-24 DIAGNOSIS — N183 Chronic kidney disease, stage 3 unspecified: Secondary | ICD-10-CM

## 2020-12-24 DIAGNOSIS — E785 Hyperlipidemia, unspecified: Secondary | ICD-10-CM

## 2020-12-24 MED ORDER — NITROGLYCERIN 0.4 MG SL SUBL: 0.4000 mg | SUBLINGUAL_TABLET | SUBLINGUAL | 3 refills | Status: DC | PRN

## 2020-12-24 MED ORDER — METOPROLOL TARTRATE 50 MG PO TABS: 50.0000 mg | ORAL_TABLET | Freq: Two times a day (BID) | ORAL | 3 refills | Status: DC

## 2020-12-24 NOTE — Progress Notes (Signed)
Cardiology Office Note   Date:  12/24/2020   ID:  Brandon Buck, DOB 1961/10/09, MRN [id number]  PCP:  [name], MD  Cardiologist:  Was Dr. Bronson Ing    Chief Complaint  Patient presents with  . Coronary Artery Disease      History of Present Illness: Brandon Buck is a 60 y.o. male who presents for BP, CAD and edema.   History of coronary artery disease and prior PCI to the circumflex and RCA in 2009 as well as hypertension, CKD stage III with Rt nephrectomy in 2003, and COPD.  Last echo 10 2015 with EF 60-10%, PA systolic pressure 31 mmHg.  Last visit tele health 12/05/19 with Dr. Bronson Ing.  He also has stage T1c adenocarcinoma of the prostate.  Treated with seed implant 10/2020.  Lipids 08/2020 with HDL 42, LDL 168 TG 242  On GU visits BP elevated 164/72 Plan for hernia repair in future.  Has chronic exertional dyspnea that is stable.  Stopped smoking in 2009, occ chest pain in past and relief with NTG. Cr in Jan 1.51 which is stable for him.  He has lower ext edema at times -then he may have some mild chest discomfort he takes a NTG and all resolves.  He does not eat much salt.   He is active and with activity he does not have chest pain.  His BP is elevated and HR elevated today.  His lisnopril was decreased due to elevated K+.  Today he is feeling well.  No edema today.    Past Medical History:  Diagnosis Date  . Anxiety   . Arthritis   . Asthma   . Cancer [facility])    right renal cancer, right radical nephrectomy 2004  . Chronic back pain   . Chronic hip pain, left   . CKD (chronic kidney disease) stage 3, GFR 30-59 ml/min (HCC)    ckd stage 3 b   . Complication of anesthesia    1 seizure after anesthesia 1999 or 2000 none since , recent surgeries no seizures  . Coronary artery disease   . Depression   . GERD (gastroesophageal reflux disease)   . Gout    no recent flares  . Heart disease   . High blood pressure   . History of PTCA 2 2004   2 stents  .  History of stomach ulcers   . Hypercholesteremia   . Hypertension   . Kidney disease   . Myocardial infarction ([place]) 2004  . Prostate cancer ([place])    3/12 cores upt to 40% grade 2 cancer. Planning brachytherapy (2021)  . Proteinuria   . Renal insufficiency   . Tennis elbow     Past Surgical History:  Procedure Laterality Date  . COLONOSCOPY WITH PROPOFOL N/A 10/28/2020   Procedure: COLONOSCOPY WITH PROPOFOL;  Surgeon: Harvel Quale, MD;  [facility];  Service: Gastroenterology;  Laterality: N/A;  2:30  . coloscopy  10/28/2020   removed 7 polyps done at [facility]  . CYSTOSCOPY N/A 11/03/2020   Procedure: CYSTOSCOPY;  Surgeon: Franchot Gallo, MD;  Location: [facility];  Service: Urology;  Laterality: N/A;  . Heart catherization  2004   2 stents to heart cypher left circulflex  . HERNIA REPAIR  1999   left groin bilateral  . HIP SURGERY Left 1999 or 2000   bond graft  . Kidney removed Right 2003 or 2004   for cancer  . POLYPECTOMY  10/28/2020   Procedure: POLYPECTOMY INTESTINAL;  Surgeon: Harvel Quale, MD;  [facility];  Service: Gastroenterology;;  . PROSTATE BIOPSY  2021  . RADIOACTIVE SEED IMPLANT N/A 11/03/2020   Procedure: RADIOACTIVE SEED IMPLANT/BRACHYTHERAPY IMPLANT;  Surgeon: Franchot Gallo, MD;  Location[facility];  Service: Urology;  Laterality: N/A;  90 MINS  . SPACE OAR INSTILLATION N/A 11/03/2020   Procedure: SPACE OAR INSTILLATION;  Surgeon: Franchot Gallo, MD;  Location: [facility];  Service: Urology;  Laterality: N/A;     Current Outpatient Medications  Medication Sig Dispense Refill  . allopurinol (ZYLOPRIM) 100 MG tablet TAKE 2 TABLETS BY MOUTH DAILY. 60 tablet 0  . ALPRAZolam (XANAX) 0.5 MG tablet TAKE (1) TABLET BY MOUTH TWICE DAILY AS NEEDED FOR ANXIETY 30 tablet 0  . aspirin 81 MG tablet Take 81 mg by mouth daily.    Marland Kitchen atorvastatin (LIPITOR) 80 MG  tablet TAKE (1) TABLET BY MOUTH ONCE DAILY. (Patient taking differently: Take 80 mg by mouth daily.) 90 tablet 1  . diltiazem (CARDIZEM CD) 240 MG 24 hr capsule TAKE (1) CAPSULE BY MOUTH DAILY. (Patient taking differently: Take 240 mg by mouth daily.) 90 capsule 2  . ezetimibe (ZETIA) 10 MG tablet Take 10 mg by mouth daily.     . fluticasone (FLONASE) 50 MCG/ACT nasal spray Place 2 sprays into both nostrils daily. 16 g 6  . furosemide (LASIX) 20 MG tablet Take 1 tablet (20 mg total) by mouth daily. 30 tablet 3  . HYDROcodone-acetaminophen (NORCO/VICODIN) 5-325 MG tablet TAKE 1 TABLET BY MOUTH THREE TIMES DAILY AS NEEDED FOR MODERATE PAIN. 90 tablet 0  . lisinopril (ZESTRIL) 20 MG tablet Take 20 mg by mouth daily.    . metoprolol tartrate (LOPRESSOR) 50 MG tablet Take 1 tablet (50 mg total) by mouth 2 (two) times daily. 180 tablet 3  . NICOTROL 10 MG inhaler INHALE 1 CARTRIDGE INTO THE LUNGS AS NEEDED FOR SMOKING CESSATION. 168 each 0  . pantoprazole (PROTONIX) 40 MG tablet Take 1 tablet (40 mg total) by mouth 2 (two) times daily. (Patient taking differently: Take 40 mg by mouth daily.) 60 tablet 5  . Pumpkin Seed-Soy Germ (AZO BLADDER CONTROL/GO-LESS PO) Take by mouth.    . tamsulosin (FLOMAX) 0.4 MG CAPS capsule Take 1 capsule (0.4 mg total) by mouth daily after supper. 90 capsule 11  . nitroGLYCERIN (NITROSTAT) 0.4 MG SL tablet Place 1 tablet (0.4 mg total) under the tongue every 5 (five) minutes as needed. 25 tablet 3   No current facility-administered medications for this visit.    Allergies:   Other and Keflex [cephalexin]    Social History:  The patient  reports that he quit smoking about 17 years ago. His smoking use included cigarettes. He started smoking about 49 years ago. He has a 48.00 pack-year smoking history. He has never used smokeless tobacco. He reports current alcohol use. He reports that he does not use drugs.   Family History:  The patient's family history includes Bladder  Cancer in his maternal grandfather; Cancer in an other family member; Heart disease in his father, mother, and another family member; Kidney cancer in his mother; Kidney disease in an other family member; Lung cancer in his mother; Prostate cancer in his father and maternal uncle.    ROS:  General:no colds or fevers, no weight changes Skin:no rashes or ulcers HEENT:no blurred vision, no congestion CV:see HPI PUL:see HPI GI:no diarrhea constipation or melena, no indigestion  GU:no hematuria, no dysuria MS:no joint pain, no claudication Neuro:no syncope, no lightheadedness Endo:no diabetes, no thyroid disease  Wt Readings from Last 3 Encounters:  12/24/20 219 lb (99.3 kg)  12/09/20 222 lb (100.7 kg)  11/20/20 220 lb 3.2 oz (99.9 kg)     PHYSICAL EXAM: VS:  BP (!) 162/84   Pulse (!) 112   Ht 6' (1.829 m)   Wt 219 lb (99.3 kg)   SpO2 98%   BMI 29.70 kg/m  , BMI Body mass index is 29.7 kg/m. General:Pleasant affect, NAD Skin:Warm and dry, brisk capillary refill HEENT:normocephalic, sclera clear, mucus membranes moist Neck:supple, no JVD, no bruits  Heart:S1S2 RRR without murmur, gallup, rub or click Lungs:clear without rales, rhonchi, or wheezes GYF:VCBS, non tender, + BS, do not palpate liver spleen or masses Ext:no lower ext edema, 2+ pedal pulses, 2+ radial pulses Neuro:alert and oriented X 3, MAE, follows commands, + facial symmetry    EKG:  EKG is ordered today. The ekg ordered today demonstrates ST at 113, no ST changes from 03/14/20.     Recent Labs: 10/29/2020: ALT 39; BUN 15; Creatinine, Ser 1.51; Hemoglobin 15.6; Platelets 204; Potassium 4.1; Sodium 140    Lipid Panel    Component Value Date/Time   CHOL 116 08/29/2020 1127   TRIG 83 08/29/2020 1127   HDL 42 08/29/2020 1127   CHOLHDL 2.8 08/29/2020 1127   VLDL 21 03/31/2017 1200   LDLCALC 57 08/29/2020 1127       Other studies Reviewed: Additional studies/ records that were reviewed today include:  . Echo 08/12/2014 Study Conclusions   - Left ventricle: The cavity size was normal. Systolic function was  normal. The estimated ejection fraction was in the range of 60%  to 65%. Wall motion was normal; there were no regional wall  motion abnormalities.  - Pulmonary arteries: Systolic pressure was mildly increased. PA  peak pressure: 31 mm Hg (S).    ASSESSMENT AND PLAN:  1.  CAD with prior stent to LCX and RCA in 2009.  Angina only with edema.  NTG with relief will refill NTG 2.  Edema/SOB episodic on lasix will control BP and check echo, currently no edema. does not eat much salt.  Follow up in 2 weeks.  3.  HTN not well controlled, has been low at times.  Will increase lopressor to 50 BID, lisinopril at higher dose caused hyperkalemia. Recheck in 3 weeks here 4. Prostate cancer per urology reviewed surgery, prior notes and BP checks 5.  hLD per PCP on lipitor 80 and zetia may be a candidate for nexletol  6.  CKD 3 stab;e but hx nephrectomy - monitor,    Current medicines are reviewed with the patient today.  The patient Has no concerns regarding medicines.  The following changes have been made:  See above Labs/ tests ordered today include:see above  Disposition:   FU:  see above  Signed, Cecilie Kicks, NP  12/24/2020 9:08 PM    [facility] [place], [place], [place], [place] Phone: [phone]; Fax: [phone]

## 2020-12-24 NOTE — Patient Instructions (Signed)
Medication Instructions:  Your physician has recommended you make the following change in your medication:   Increase Lopressor to 50 mg Daily    *If you need a refill on your cardiac medications before your next appointment, please call your pharmacy*   Lab Work: NONE   If you have labs (blood work) drawn today and your tests are completely normal, you will receive your results only by: Marland Kitchen MyChart Message (if you have MyChart) OR . A paper copy in the mail If you have any lab test that is abnormal or we need to change your treatment, we will call you to review the results.   Testing/Procedures: Your physician has requested that you have an echocardiogram. Echocardiography is a painless test that uses sound waves to create images of your heart. It provides your doctor with information about the size and shape of your heart and how well your heart's chambers and valves are working. This procedure takes approximately one hour. There are no restrictions for this procedure.     Follow-Up: At [facility], you and your health needs are our priority.  As part of our continuing mission to provide you with exceptional heart care, we have created designated Provider Care Teams.  These Care Teams include your primary Cardiologist (physician) and Advanced Practice Providers (APPs -  Physician Assistants and Nurse Practitioners) who all work together to provide you with the care you need, when you need it.  We recommend signing up for the patient portal called "MyChart".  Sign up information is provided on this After Visit Summary.  MyChart is used to connect with patients for Virtual Visits (Telemedicine).  Patients are able to view lab/test results, encounter notes, upcoming appointments, etc.  Non-urgent messages can be sent to your provider as well.   To learn more about what you can do with MyChart, go to [email or web address].    Your next appointment:   3 week(s)  The format for your  next appointment:   In Person  Provider:   You may see No primary care provider on file. or one of the following Advanced Practice Providers on your designated Care Team:    Bernerd Pho, PA-C   Ermalinda Barrios, [place]     Other Instructions Thank you for choosing [facility]!

## 2020-12-25 ENCOUNTER — Telehealth: Payer: Self-pay

## 2020-12-25 NOTE — Telephone Encounter (Signed)
Patient called and made aware. Put on schedule for next Tuesday.

## 2020-12-25 NOTE — Telephone Encounter (Signed)
It is unusual that Flomax will do that.  It is okay to have him stop it.  It might be worthwhile to work him in next Tuesday to be seen by me.

## 2020-12-25 NOTE — Telephone Encounter (Signed)
Patient called to asked MD if he should have incontinence with starting flomax.   Patient reports on day 2 of taking medication he has no bladder control and urinates on him self.   Message sent to MD

## 2020-12-30 ENCOUNTER — Ambulatory Visit: Payer: PPO | Admitting: Urology

## 2020-12-30 DIAGNOSIS — C61 Malignant neoplasm of prostate: Secondary | ICD-10-CM

## 2021-01-13 ENCOUNTER — Other Ambulatory Visit: Payer: Self-pay | Admitting: Family Medicine

## 2021-01-13 DIAGNOSIS — F411 Generalized anxiety disorder: Secondary | ICD-10-CM

## 2021-01-13 DIAGNOSIS — M1000 Idiopathic gout, unspecified site: Secondary | ICD-10-CM

## 2021-01-14 ENCOUNTER — Ambulatory Visit (HOSPITAL_COMMUNITY)
Admission: RE | Admit: 2021-01-14 | Discharge: 2021-01-14 | Disposition: A | Discharge status: Home / Self Care | Payer: PPO | Source: Ambulatory Visit | Attending: Cardiology | Admitting: Cardiology

## 2021-01-14 ENCOUNTER — Other Ambulatory Visit: Payer: Self-pay

## 2021-01-14 DIAGNOSIS — R0602 Shortness of breath: Secondary | ICD-10-CM | POA: Insufficient documentation

## 2021-01-14 LAB — ECHOCARDIOGRAM COMPLETE
AR max vel: 2.46 cm2
AV Area VTI: 2.48 cm2
AV Area mean vel: 2.18 cm2
AV Mean grad: 3.5 mmHg
AV Peak grad: 6.9 mmHg
Ao pk vel: 1.31 m/s
Area-P 1/2: 4.52 cm2
S' Lateral: 2.6 cm

## 2021-01-14 NOTE — Progress Notes (Signed)
*  PRELIMINARY RESULTS* Echocardiogram 2D Echocardiogram has been performed.  Brandon Buck 01/14/2021, 1:57 PM

## 2021-01-21 ENCOUNTER — Other Ambulatory Visit: Payer: Self-pay

## 2021-01-21 ENCOUNTER — Encounter: Payer: Self-pay | Admitting: Student

## 2021-01-21 ENCOUNTER — Ambulatory Visit: Payer: PPO | Admitting: Student

## 2021-01-21 VITALS — BP 134/68 | HR 66 | Ht 72.0 in | Wt 223.0 lb

## 2021-01-21 DIAGNOSIS — I25118 Atherosclerotic heart disease of native coronary artery with other forms of angina pectoris: Secondary | ICD-10-CM

## 2021-01-21 DIAGNOSIS — R0600 Dyspnea, unspecified: Secondary | ICD-10-CM | POA: Diagnosis not present

## 2021-01-21 DIAGNOSIS — N1831 Chronic kidney disease, stage 3a: Secondary | ICD-10-CM | POA: Diagnosis not present

## 2021-01-21 DIAGNOSIS — R0609 Other forms of dyspnea: Secondary | ICD-10-CM

## 2021-01-21 DIAGNOSIS — I10 Essential (primary) hypertension: Secondary | ICD-10-CM | POA: Diagnosis not present

## 2021-01-21 DIAGNOSIS — C61 Malignant neoplasm of prostate: Secondary | ICD-10-CM | POA: Diagnosis not present

## 2021-01-21 DIAGNOSIS — E785 Hyperlipidemia, unspecified: Secondary | ICD-10-CM

## 2021-01-21 NOTE — Patient Instructions (Signed)
Medication Instructions:   Continue current medication regimen.   Labwork:  None.  Testing/Procedures:  Lexiscan Myoview to rule-out any blockages.   Follow-Up:  In 6 months with [place], PA-C. Will arrange closer follow-up if stress test is abnormal.   Any Other Special Instructions Will Be Listed Below (If Applicable).     If you need a refill on your cardiac medications before your next appointment, please call your pharmacy.

## 2021-01-21 NOTE — Progress Notes (Signed)
Cardiology Office Note    Date:  01/21/2021   ID:  Creston, Klas [birth year], MRN [id number]  PCP:  [name], MD  Cardiologist: Previously Dr. Bronson Ing --> Will need to switch to new MD  Chief Complaint  Patient presents with  . Follow-up    3 week visit    History of Present Illness:    Brandon Buck is a 60 y.o. male with past medical history of CAD (s/p prior PCI to RCA and LCx in 2009), HTN, HLD, COPD, prostate cancer (s/p seed implantation and brachytherapy) and Stage 3 CKD (prior right nephrectomy in 2003) who presents to the office today for 3-week follow-up.   He recently saw Cecilie Kicks, NP on 12/24/2020 and reported having stable dyspnea on exertion but had experienced intermittent chest discomfort which resolved with the use of SL NTG. BP was elevated to 162/84 at the time of his visit heart rate was at 112. Lopressor was therefore increased to 50 mg twice daily and he was continued on Cardizem CD 240 mg daily and Lisinopril 20 mg daily (had hyperkalemia with higher doses). A follow-up echocardiogram was obtained which showed a preserved EF of 65 to 70% with no regional wall motion abnormalities.  RV function was normal and he did have trivial MR.  In talking with the patient today, he reports having baseline dyspnea on exertion in the setting of COPD but does feel like his breathing has worsened over the past several months. Denies any specific orthopnea or PND. He also reports intermittent episodes of chest discomfort which typically occur with activity and improve with SL NTG x1. He is unsure if this resembles his prior anginal symptoms but says symptoms have progressed within the past year.   Past Medical History:  Diagnosis Date  . Anxiety   . Arthritis   . Asthma   . Cancer [facility])    right renal cancer, right radical nephrectomy 2004  . Chronic back pain   . Chronic hip pain, left   . CKD (chronic kidney disease) stage 3, GFR 30-59 ml/min (HCC)     ckd stage 3 b   . Complication of anesthesia    1 seizure after anesthesia 1999 or 2000 none since , recent surgeries no seizures  . Coronary artery disease    a. s/p prior PCI to RCA and LCx in 2009  . Depression   . GERD (gastroesophageal reflux disease)   . Gout    no recent flares  . Heart disease   . High blood pressure   . History of PTCA 2 2004   2 stents  . History of stomach ulcers   . Hypercholesteremia   . Hypertension   . Kidney disease   . Myocardial infarction ([place]) 2004  . Prostate cancer ([place])    3/12 cores upt to 40% grade 2 cancer. Planning brachytherapy (2021)  . Proteinuria   . Renal insufficiency   . Tennis elbow     Past Surgical History:  Procedure Laterality Date  . COLONOSCOPY WITH PROPOFOL N/A 10/28/2020   Procedure: COLONOSCOPY WITH PROPOFOL;  Surgeon: Harvel Quale, MD;  [facility];  Service: Gastroenterology;  Laterality: N/A;  2:30  . coloscopy  10/28/2020   removed 7 polyps done at [facility]  . CYSTOSCOPY N/A 11/03/2020   Procedure: CYSTOSCOPY;  Surgeon: Franchot Gallo, MD;  Location[facility];  Service: Urology;  Laterality: N/A;  . Heart catherization  2004  2 stents to heart cypher left circulflex  . HERNIA REPAIR  1999   left groin bilateral  . HIP SURGERY Left 1999 or 2000   bond graft  . Kidney removed Right 2003 or 2004   for cancer  . POLYPECTOMY  10/28/2020   Procedure: POLYPECTOMY INTESTINAL;  Surgeon: Harvel Quale, MD;  [facility];  Service: Gastroenterology;;  . PROSTATE BIOPSY  2021  . RADIOACTIVE SEED IMPLANT N/A 11/03/2020   Procedure: RADIOACTIVE SEED IMPLANT/BRACHYTHERAPY IMPLANT;  Surgeon: Franchot Gallo, MD;  Location: [facility];  Service: Urology;  Laterality: N/A;  90 MINS  . SPACE OAR INSTILLATION N/A 11/03/2020   Procedure: SPACE OAR INSTILLATION;  Surgeon: Franchot Gallo, MD;  Location: [facility];  Service:  Urology;  Laterality: N/A;    Current Medications: Outpatient Medications Prior to Visit  Medication Sig Dispense Refill  . allopurinol (ZYLOPRIM) 100 MG tablet TAKE 2 TABLETS BY MOUTH DAILY. 60 tablet 3  . ALPRAZolam (XANAX) 0.5 MG tablet TAKE (1) TABLET BY MOUTH TWICE DAILY AS NEEDED FOR ANXIETY 30 tablet 0  . aspirin 81 MG tablet Take 81 mg by mouth daily.    Marland Kitchen atorvastatin (LIPITOR) 80 MG tablet TAKE (1) TABLET BY MOUTH ONCE DAILY. (Patient taking differently: Take 80 mg by mouth daily.) 90 tablet 1  . diltiazem (CARDIZEM CD) 240 MG 24 hr capsule TAKE (1) CAPSULE BY MOUTH DAILY. (Patient taking differently: Take 240 mg by mouth daily.) 90 capsule 2  . ezetimibe (ZETIA) 10 MG tablet Take 10 mg by mouth daily.     . fluticasone (FLONASE) 50 MCG/ACT nasal spray Place 2 sprays into both nostrils daily. 16 g 6  . furosemide (LASIX) 20 MG tablet Take 1 tablet (20 mg total) by mouth daily. 30 tablet 3  . HYDROcodone-acetaminophen (NORCO/VICODIN) 5-325 MG tablet TAKE 1 TABLET BY MOUTH THREE TIMES DAILY AS NEEDED FOR MODERATE PAIN. 90 tablet 0  . lisinopril (ZESTRIL) 20 MG tablet Take 20 mg by mouth daily.    . metoprolol tartrate (LOPRESSOR) 50 MG tablet Take 1 tablet (50 mg total) by mouth 2 (two) times daily. 180 tablet 3  . NICOTROL 10 MG inhaler INHALE 1 CARTRIDGE INTO THE LUNGS AS NEEDED FOR SMOKING CESSATION. 168 each 0  . nitroGLYCERIN (NITROSTAT) 0.4 MG SL tablet Place 1 tablet (0.4 mg total) under the tongue every 5 (five) minutes as needed. 25 tablet 3  . pantoprazole (PROTONIX) 40 MG tablet Take 1 tablet (40 mg total) by mouth 2 (two) times daily. (Patient taking differently: Take 40 mg by mouth daily.) 60 tablet 5  . Pumpkin Seed-Soy Germ (AZO BLADDER CONTROL/GO-LESS PO) Take by mouth.    . tamsulosin (FLOMAX) 0.4 MG CAPS capsule Take 1 capsule (0.4 mg total) by mouth daily after supper. 90 capsule 11   No facility-administered medications prior to visit.     Allergies:   Other and  Keflex [cephalexin]   Social History   Socioeconomic History  . Marital status: Married    Spouse name: Not on file  . Number of children: 0  . Years of education: Not on file  . Highest education level: Not on file  Occupational History  . Occupation: retired  Tobacco Use  . Smoking status: Former Smoker    Packs/day: 1.50    Years: 32.00    Pack years: 48.00    Types: Cigarettes    Start date: 01/26/1971    Quit date: 01/26/2003    Years  since quitting: 18.0  . Smokeless tobacco: Never Used  Vaping Use  . Vaping Use: Never used  Substance and Sexual Activity  . Alcohol use: Yes    Alcohol/week: 0.0 standard drinks    Comment: occ 2 week  . Drug use: No  . Sexual activity: Yes  Other Topics Concern  . Not on file  Social History Narrative  . Not on file   Social Determinants of Health   Financial Resource Strain: Low Risk   . Difficulty of Paying Living Expenses: Not very hard  Food Insecurity: Not on file  Transportation Needs: Not on file  Physical Activity: Not on file  Stress: Not on file  Social Connections: Not on file     Family History:  The patient's family history includes Bladder Cancer in his maternal grandfather; Cancer in an other family member; Heart disease in his father, mother, and another family member; Kidney cancer in his mother; Kidney disease in an other family member; Lung cancer in his mother; Prostate cancer in his father and maternal uncle.   Review of Systems:   Please see the history of present illness.     General:  No chills, fever, night sweats or weight changes.  Cardiovascular:  No edema, orthopnea, palpitations, paroxysmal nocturnal dyspnea. Positive for dyspnea on exertion and chest pain.  Dermatological: No rash, lesions/masses Respiratory: No cough, dyspnea Urologic: No hematuria, dysuria Abdominal:   No nausea, vomiting, diarrhea, bright red blood per rectum, melena, or hematemesis Neurologic:  No visual changes, wkns,  changes in mental status. All other systems reviewed and are otherwise negative except as noted above.   Physical Exam:    VS:  BP 134/68   Pulse 66   Ht 6' (1.829 m)   Wt 223 lb (101.2 kg)   SpO2 96%   BMI 30.24 kg/m    General: Well developed, well nourished,male appearing in no acute distress. Head: Normocephalic, atraumatic. Neck: No carotid bruits. JVD not elevated.  Lungs: Respirations regular and unlabored, without wheezes or rales.  Heart: Regular rate and rhythm. No S3 or S4.  No murmur, no rubs, or gallops appreciated. Abdomen: Appears non-distended. No obvious abdominal masses. Msk:  Strength and tone appear normal for age. No obvious joint deformities or effusions. Extremities: No clubbing or cyanosis. Trace ankle edema.  Distal pedal pulses are 2+ bilaterally. Neuro: Alert and oriented X 3. Moves all extremities spontaneously. No focal deficits noted. Psych:  Responds to questions appropriately with a normal affect. Skin: No rashes or lesions noted  Wt Readings from Last 3 Encounters:  01/21/21 223 lb (101.2 kg)  12/24/20 219 lb (99.3 kg)  12/09/20 222 lb (100.7 kg)     Studies/Labs Reviewed:   EKG:  EKG is not ordered today. EKG from 12/24/2020 is reviewed and shows sinus tachycardia, HR 113 with no acute ST changes when compared to prior tracings.  Recent Labs: 10/29/2020: ALT 39; BUN 15; Creatinine, Ser 1.51; Hemoglobin 15.6; Platelets 204; Potassium 4.1; Sodium 140   Lipid Panel    Component Value Date/Time   CHOL 116 08/29/2020 1127   TRIG 83 08/29/2020 1127   HDL 42 08/29/2020 1127   CHOLHDL 2.8 08/29/2020 1127   VLDL 21 03/31/2017 1200   LDLCALC 57 08/29/2020 1127    Additional studies/ records that were reviewed today include:   NST: 05/2016  Equivocal ST segment changes, mainly lead II, III, and aVF, less than 1 mm. Intermediate Duke treadmill score of 0.5.  Blood pressure demonstrated  a hypertensive response to exercise.  Small, mild  intensity, fixed mid to basal inferior defect that is more prominent at rest and consistent with soft tissue attenuation.  This is a low risk study based on perfusion imaging.  Nuclear stress EF: 71%.   Echocardiogram: 12/2020 IMPRESSIONS    1. Left ventricular ejection fraction, by estimation, is 65 to 70%. The  left ventricle has normal function. The left ventricle has no regional  wall motion abnormalities. Left ventricular diastolic parameters were  normal.  2. Right ventricular systolic function is normal. The right ventricular  size is normal. There is normal pulmonary artery systolic pressure.  3. Left atrial size was mildly dilated.  4. The mitral valve is normal in structure. Trivial mitral valve  regurgitation. No evidence of mitral stenosis.  5. The aortic valve is tricuspid. Aortic valve regurgitation is not  visualized. No aortic stenosis is present.  6. The inferior vena cava is normal in size with greater than 50%  respiratory variability, suggesting right atrial pressure of 3 mmHg.   Assessment:    1. Coronary artery disease of native artery of native heart with stable angina pectoris ([place])   2. Dyspnea on exertion   3. Essential hypertension   4. Hyperlipidemia LDL goal <70   5. Stage 3a chronic kidney disease ([place])   6. Malignant neoplasm of prostate ([place])      Plan:   In order of problems listed above:  1. CAD/Dyspnea on Exertion - He is s/p prior PCI to RCA and LCx in 2009 with most recent ischemic evaluation being a stress test in 05/2016 which was low-risk as outlined above. - He has baseline dyspnea on exertion in the setting of COPD but does report progression of his symptoms over the past several months and he has also experienced intermittent episodes of chest discomfort which occur with activity and improve with SL NTG x1. Recent echo showed a preserved EF with no regional WMA. Reviewed options with the patient and will plan to obtain a  Lexiscan Myoview for further ischemic evaluation. - Continue current medication regimen with ASA 81 mg daily, Zetia 10 mg daily, Lopressor 50 mg twice daily and Atorvastatin 80 mg daily.  2. HTN - His blood pressure has significantly improved since his last visit and is at 134/68 during today's visit. Continue current medication regimen with Cardizem CD 240 mg daily, Lisinopril 20 mg daily and Lopressor 50 mg twice daily.  3. HLD - His LDL was at 57 when checked in 08/2020. Continue Atorvastatin 80 mg daily and Zetia 10 mg daily.  4. Stage 3 CKD - He has a solitary kidney as he previously required right nephrectomy in 2003. Creatinine was overall stable at 1.51 by recent labs in 10/2020.  5.  Prostate Cancer - He is s/p seed implantation and brachytherapy. Being followed by Urology and Radiation Oncology.    Shared Decision Making/Informed Consent:   Shared Decision Making/Informed Consent The risks [chest pain, shortness of breath, cardiac arrhythmias, dizziness, blood pressure fluctuations, myocardial infarction, stroke/transient ischemic attack, nausea, vomiting, allergic reaction, radiation exposure, metallic taste sensation and life-threatening complications (estimated to be 1 in 10,000)], benefits (risk stratification, diagnosing coronary artery disease, treatment guidance) and alternatives of a nuclear stress test were discussed in detail with Mr. Mian and he agrees to proceed.   Medication Adjustments/Labs and Tests Ordered: Current medicines are reviewed at length with the patient today.  Concerns regarding medicines are outlined above.  Medication changes, Labs and Tests  ordered today are listed in the Patient Instructions below. Patient Instructions  Medication Instructions:   Continue current medication regimen.   Labwork:  None.  Testing/Procedures:  Lexiscan Myoview to rule-out any blockages.   Follow-Up:  In 6 months with [place], PA-C. Will arrange  closer follow-up if stress test is abnormal.   Any Other Special Instructions Will Be Listed Below (If Applicable).   If you need a refill on your cardiac medications before your next appointment, please call your pharmacy.    Signed, Erma Heritage, PA-C  01/21/2021 3:05 PM    [facility]. [address] [place], [place] [address] Phone: [phone] Fax: [phone]

## 2021-02-01 IMAGING — CT CT ABD-PELV W/O CM
2 of 4 series · 16 of 46 positions shown, 18 images · non-contrast
Comparison: CT of the chest abdomen pelvis dated 10/05/2011.

CLINICAL DATA: 59-year-old male with prostate cancer. Staging.

EXAM:
CT ABDOMEN AND PELVIS WITHOUT CONTRAST
TECHNIQUE: Multidetector CT imaging of the abdomen and pelvis was performed
following the standard protocol without IV contrast.

[Series 2: axial st · axial · 0.80mm/px · z∈[-505,-65]mm · 13 of 100 slices shown, 15 images]
[im 6/100  soft-tissue]
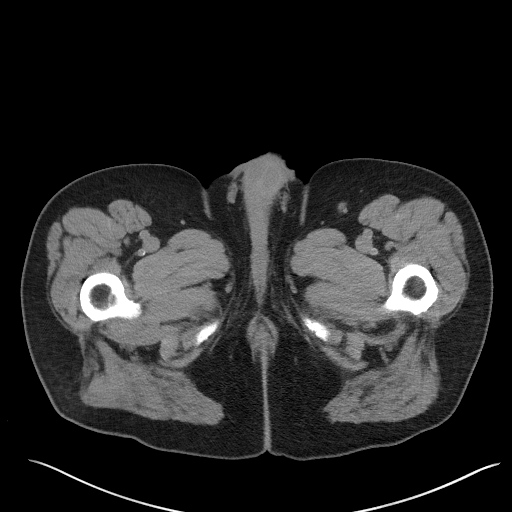
[im 6/100  bone]
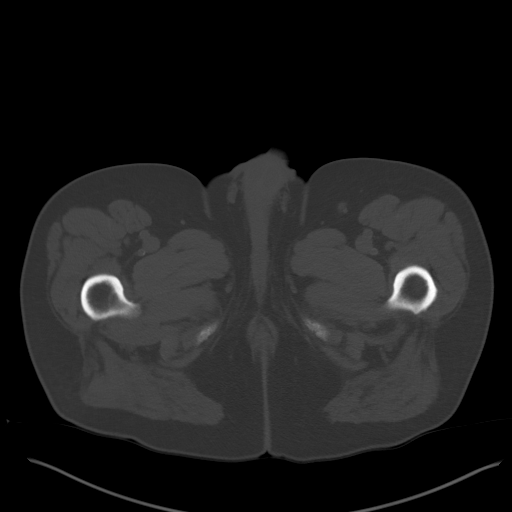
[im 16/100  soft-tissue]
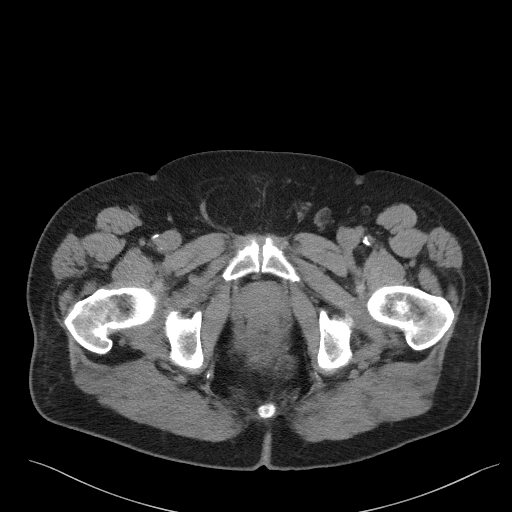
[im 21/100  soft-tissue]
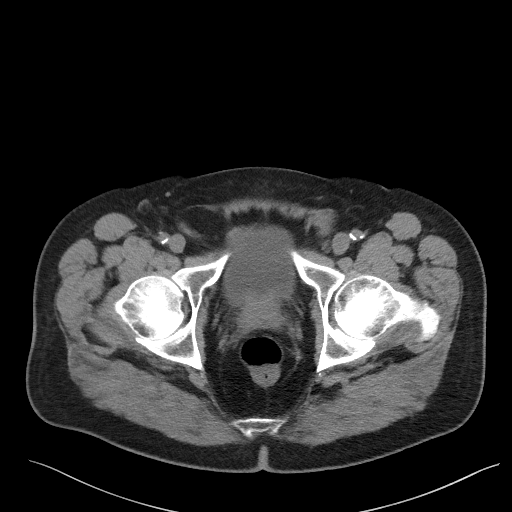
[im 27/100  soft-tissue]
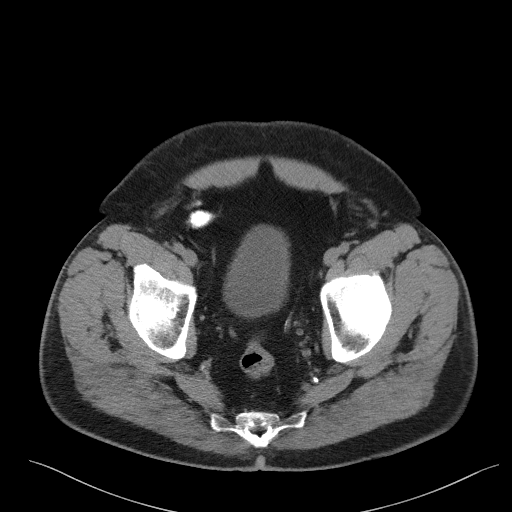
[im 37/100  soft-tissue]
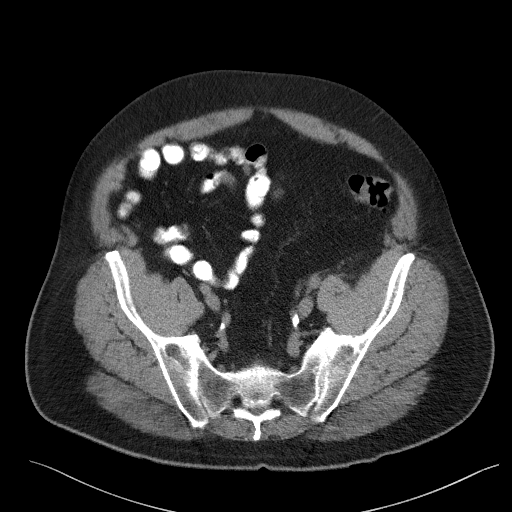
[im 42/100  soft-tissue]
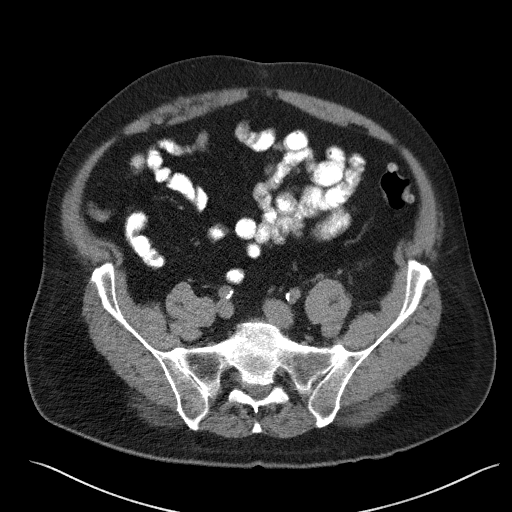
[im 53/100  soft-tissue]
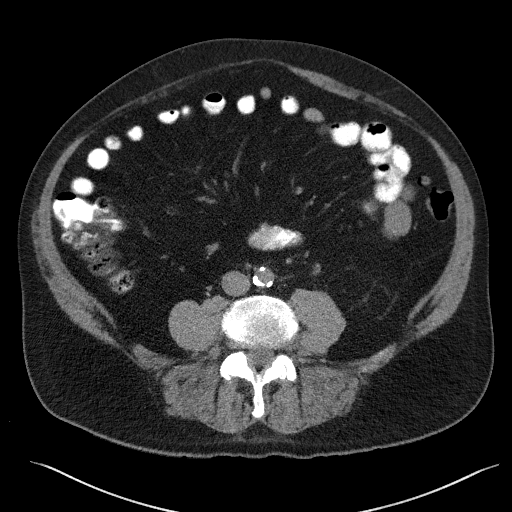
[im 58/100  soft-tissue]
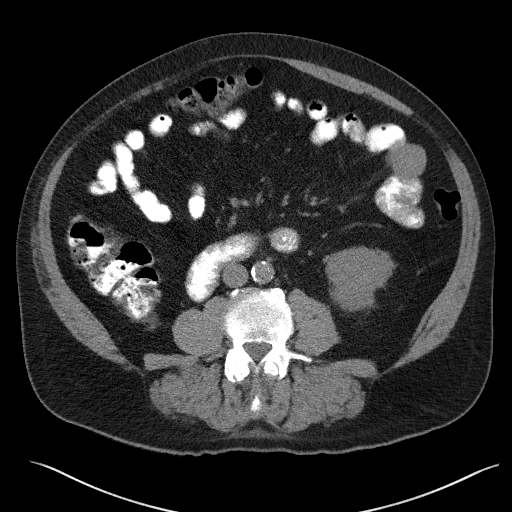
[im 63/100  soft-tissue]
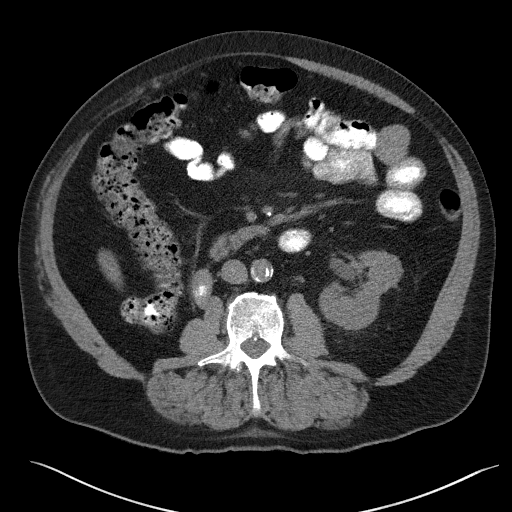
[im 63/100  bone]
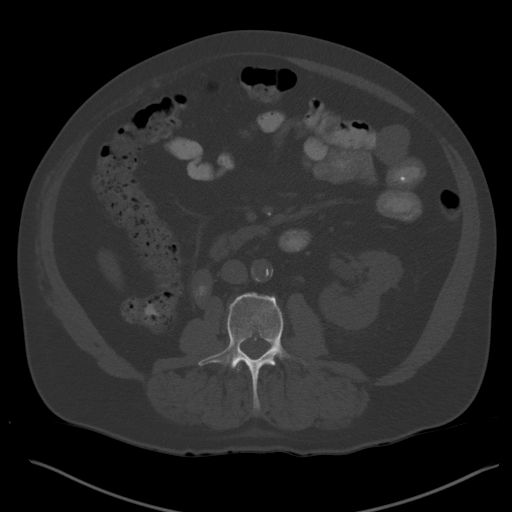
[im 73/100  soft-tissue]
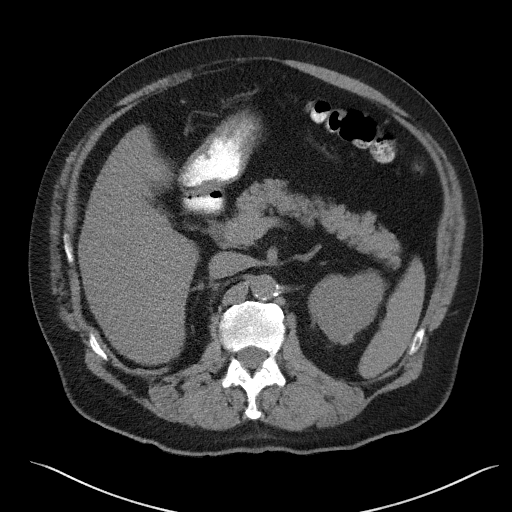
[im 79/100  soft-tissue]
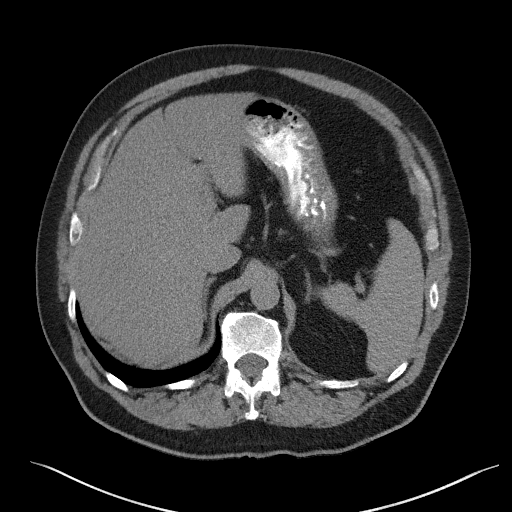
[im 84/100  soft-tissue]
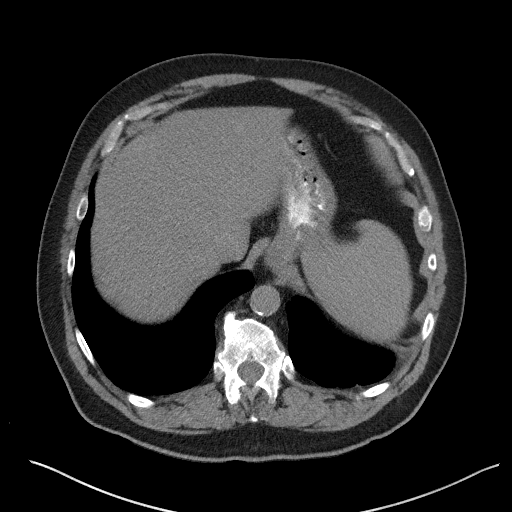
[im 94/100  soft-tissue]
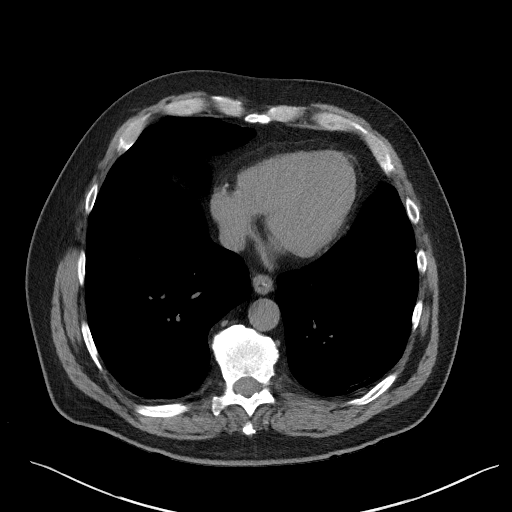

[Series 5: coronal st · coronal · 0.79mm/px · 3 of 130 slices shown]
[im 44/130  soft-tissue]
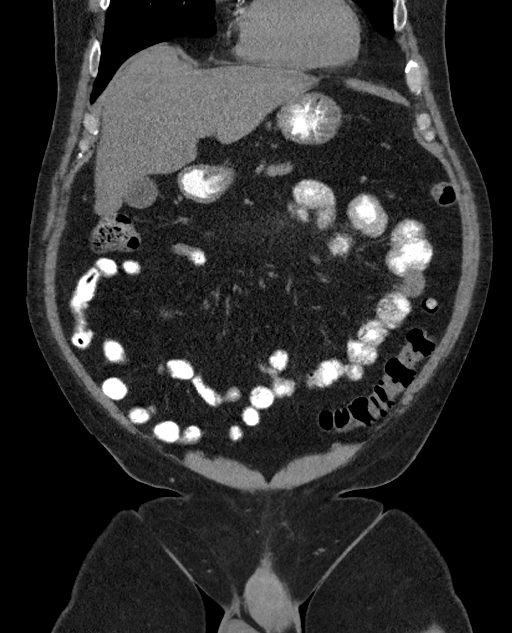
[im 58/130  soft-tissue]
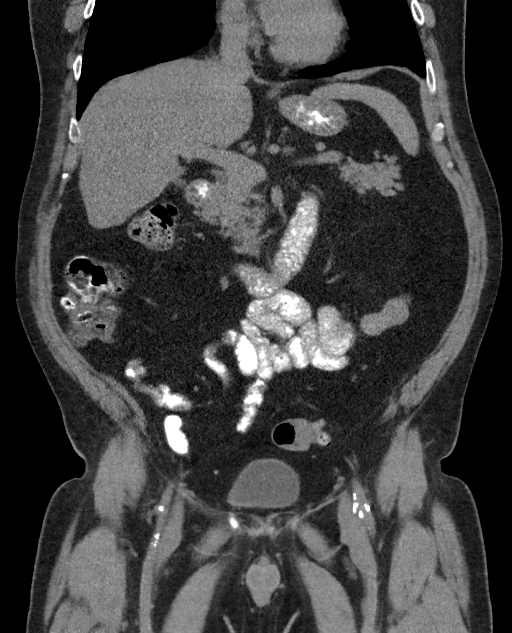
[im 72/130  soft-tissue]
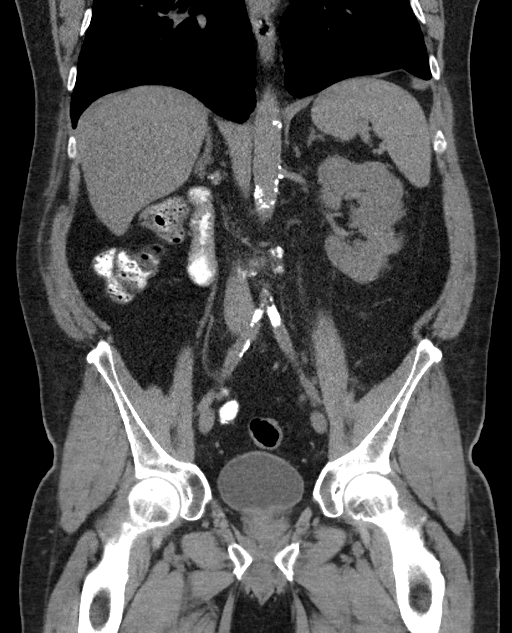

[16 of 46 positions shown; findings below may reference images not displayed]

FINDINGS: Evaluation of this exam is limited in the absence of intravenous
contrast.

Lower chest: There are bibasilar linear atelectasis/scarring. There
is 3 vessel coronary vascular calcification.

No intra-abdominal free air or free fluid.

Hepatobiliary: There is diffuse fatty infiltration of the liver. No
intrahepatic biliary dilatation. The gallbladder is unremarkable.

Pancreas: Unremarkable. No pancreatic ductal dilatation or
surrounding inflammatory changes.

Spleen: Normal in size without focal abnormality.

Adrenals/Urinary Tract: The adrenal glands unremarkable. There is a
solitary left kidney. There is no hydronephrosis or nephrolithiasis
of the left kidney. Lobulated left renal cortex. The visualized
ureters and urinary bladder appear unremarkable.

Stomach/Bowel: There is distal colonic and sigmoid diverticulosis
and scattered colonic diverticula without active inflammatory
changes. Apparent focal area of thickening of the sigmoid colon
([DATE] be related to underdistention. A colonic mass is less
likely but not excluded. Further evaluation with double-contrast
barium enema or colonoscopy is recommended. There is no bowel
obstruction. The appendix is normal.

Vascular/Lymphatic: Advanced aortoiliac atherosclerotic disease. The
IVC is unremarkable. No portal venous gas. There is no adenopathy.

Reproductive: The prostate and seminal vesicles are grossly
unremarkable. No pelvic mass.

Other: None

Musculoskeletal: Degenerative changes of the spine. No acute osseous
pathology. No suspicious bone lesions. Evidence of prior left
femoral neck fixation screw.
IMPRESSION: 1. No acute intra-abdominal or pelvic pathology. No evidence of
metastatic disease in the abdomen or pelvis.
2. Fatty liver.
3. Colonic diverticulosis.
4. Focal area of underdistention of the sigmoid colon versus less
likely colonic mass. Further evaluation with colonoscopy is
recommended.
5. Solitary left kidney. No hydronephrosis or nephrolithiasis.
6. Aortic Atherosclerosis ([id number].X).

## 2021-02-03 ENCOUNTER — Encounter (HOSPITAL_COMMUNITY): Payer: PPO

## 2021-02-13 ENCOUNTER — Other Ambulatory Visit: Payer: Self-pay | Admitting: Family Medicine

## 2021-02-13 DIAGNOSIS — F411 Generalized anxiety disorder: Secondary | ICD-10-CM

## 2021-02-13 NOTE — Telephone Encounter (Signed)
Ok to refill??  Last office visit 10/30/2019.  Last refill 01/13/2021 on both.

## 2021-03-03 ENCOUNTER — Other Ambulatory Visit: Payer: PPO

## 2021-03-03 ENCOUNTER — Other Ambulatory Visit: Payer: Self-pay

## 2021-03-03 DIAGNOSIS — C61 Malignant neoplasm of prostate: Secondary | ICD-10-CM

## 2021-03-04 LAB — PSA: Prostate Specific Ag, Serum: 0.8 ng/mL (ref 0.0–4.0)

## 2021-03-10 ENCOUNTER — Ambulatory Visit: Payer: PPO | Admitting: Urology

## 2021-03-10 ENCOUNTER — Encounter: Payer: Self-pay | Admitting: Urology

## 2021-03-10 ENCOUNTER — Other Ambulatory Visit: Payer: Self-pay

## 2021-03-10 VITALS — BP 181/96 | HR 91

## 2021-03-10 DIAGNOSIS — N401 Enlarged prostate with lower urinary tract symptoms: Secondary | ICD-10-CM | POA: Diagnosis not present

## 2021-03-10 DIAGNOSIS — N138 Other obstructive and reflux uropathy: Secondary | ICD-10-CM | POA: Diagnosis not present

## 2021-03-10 DIAGNOSIS — C61 Malignant neoplasm of prostate: Secondary | ICD-10-CM | POA: Diagnosis not present

## 2021-03-10 LAB — URINALYSIS, ROUTINE W REFLEX MICROSCOPIC
Bilirubin, UA: NEGATIVE
Glucose, UA: NEGATIVE
Ketones, UA: NEGATIVE
Leukocytes,UA: NEGATIVE
Nitrite, UA: NEGATIVE
Specific Gravity, UA: >1.03 — ABNORMAL HIGH (ref 1.005–1.030)
Urobilinogen, Ur: 0.2 mg/dL (ref 0.2–1.0)
pH, UA: 6.5 (ref 5.0–7.5)

## 2021-03-10 LAB — MICROSCOPIC EXAMINATION
Bacteria, UA: NONE SEEN
Epithelial Cells (non renal): NONE SEEN /hpf (ref 0–10)
Renal Epithel, UA: NONE SEEN /hpf
WBC, UA: NONE SEEN /hpf (ref 0–5)

## 2021-03-10 NOTE — Progress Notes (Signed)
History of Present Illness: Here for follow-up of prostate cancer.  6.29.2021: 60 year old male sent by Dr. Dennard Schaumann for evaluation of elevated PSA. PSA was 5.3 on 4.8.2021. In 2017, PSA was 3.10. He does have a history of renal cell carcinoma and is status post remote right radicalnephrectomy.  There is a strong family history of prostate cancer, in his father and grandfather. Apparently they both died of the illness.  2020-07-08: This 60 year old male presents for prostate cancer consultation. He underwent ultrasound and biopsy of his prostate on7.27.2021. Prostate volume 34.7 mL, PSA 5.3, PSAD 0.15.3/12 cores revealed adenocarcinoma as follows: Left apex lateral GS 3+3 and 95% of core Right apex medial GS 3+4 and 40% of core Right mid lateral, GS 3+3 in 10% of core   1.10.2022: I-125 brachytherapy, placement of SpaceOAR  5.17.2022: Recent PSA is 0.8.  He denies significant voiding issues other than some discomfort a couple of times a month.  IPSS 8.  No blood in urine or stool.  Past Medical History:  Diagnosis Date  . Anxiety   . Arthritis   . Asthma   . Cancer [facility])    right renal cancer, right radical nephrectomy 2004  . Chronic back pain   . Chronic hip pain, left   . CKD (chronic kidney disease) stage 3, GFR 30-59 ml/min (HCC)    ckd stage 3 b   . Complication of anesthesia    1 seizure after anesthesia 1999 or 2000 none since , recent surgeries no seizures  . Coronary artery disease    a. s/p prior PCI to RCA and LCx in 2009  . Depression   . GERD (gastroesophageal reflux disease)   . Gout    no recent flares  . Heart disease   . High blood pressure   . History of PTCA 2 2004   2 stents  . History of stomach ulcers   . Hypercholesteremia   . Hypertension   . Kidney disease   . Myocardial infarction ([place]) 2004  . Prostate cancer ([place])    3/12 cores upt to 40% grade 2 cancer. Planning brachytherapy (2021)  . Proteinuria   . Renal insufficiency   .  Tennis elbow     Past Surgical History:  Procedure Laterality Date  . COLONOSCOPY WITH PROPOFOL N/A 10/28/2020   Procedure: COLONOSCOPY WITH PROPOFOL;  Surgeon: Harvel Quale, MD;  [facility];  Service: Gastroenterology;  Laterality: N/A;  2:30  . coloscopy  10/28/2020   removed 7 polyps done at [facility]  . CYSTOSCOPY N/A 11/03/2020   Procedure: CYSTOSCOPY;  Surgeon: Franchot Gallo, MD;  Location: [facility];  Service: Urology;  Laterality: N/A;  . Heart catherization  2004   2 stents to heart cypher left circulflex  . HERNIA REPAIR  1999   left groin bilateral  . HIP SURGERY Left 1999 or 2000   bond graft  . Kidney removed Right 2003 or 2004   for cancer  . POLYPECTOMY  10/28/2020   Procedure: POLYPECTOMY INTESTINAL;  Surgeon: Harvel Quale, MD;  [facility];  Service: Gastroenterology;;  . PROSTATE BIOPSY  2021  . RADIOACTIVE SEED IMPLANT N/A 11/03/2020   Procedure: RADIOACTIVE SEED IMPLANT/BRACHYTHERAPY IMPLANT;  Surgeon: Franchot Gallo, MD;  Location: [facility];  Service: Urology;  Laterality: N/A;  90 MINS  . SPACE OAR INSTILLATION N/A 11/03/2020   Procedure: SPACE OAR INSTILLATION;  Surgeon: Franchot Gallo, MD;  Location: [facility];  Service:  Urology;  Laterality: N/A;    Home Medications:  Allergies as of 03/10/2021      Reactions   Other    Pt. Reports having seizures after anesthesia once 1999 or 2000 none since   Keflex [cephalexin] Rash      Medication List       Accurate as of Mar 10, 2021  1:01 PM. If you have any questions, ask your nurse or doctor.        allopurinol 100 MG tablet Commonly known as: ZYLOPRIM TAKE 2 TABLETS BY MOUTH DAILY.   ALPRAZolam 0.5 MG tablet Commonly known as: XANAX TAKE (1) TABLET BY MOUTH TWICE DAILY AS NEEDED FOR ANXIETY   aspirin 81 MG tablet Take 81 mg by mouth daily.   atorvastatin 80 MG tablet Commonly known as:  LIPITOR TAKE (1) TABLET BY MOUTH ONCE DAILY. What changed: See the new instructions.   AZO BLADDER CONTROL/GO-LESS PO Take by mouth.   diltiazem 240 MG 24 hr capsule Commonly known as: CARDIZEM CD TAKE (1) CAPSULE BY MOUTH DAILY. What changed: See the new instructions.   ezetimibe 10 MG tablet Commonly known as: ZETIA Take 10 mg by mouth daily.   fluticasone 50 MCG/ACT nasal spray Commonly known as: FLONASE Place 2 sprays into both nostrils daily.   furosemide 20 MG tablet Commonly known as: LASIX Take 1 tablet (20 mg total) by mouth daily.   HYDROcodone-acetaminophen 5-325 MG tablet Commonly known as: NORCO/VICODIN TAKE 1 TABLET BY MOUTH THREE TIMES DAILY AS NEEDED FOR MODERATE PAIN.   lisinopril 20 MG tablet Commonly known as: ZESTRIL Take 20 mg by mouth daily.   metoprolol tartrate 50 MG tablet Commonly known as: LOPRESSOR Take 1 tablet (50 mg total) by mouth 2 (two) times daily.   Nicotrol 10 MG inhaler Generic drug: nicotine INHALE 1 CARTRIDGE INTO THE LUNGS AS NEEDED FOR SMOKING CESSATION.   nitroGLYCERIN 0.4 MG SL tablet Commonly known as: NITROSTAT Place 1 tablet (0.4 mg total) under the tongue every 5 (five) minutes as needed.   pantoprazole 40 MG tablet Commonly known as: PROTONIX Take 1 tablet (40 mg total) by mouth 2 (two) times daily. What changed: when to take this   tamsulosin 0.4 MG Caps capsule Commonly known as: FLOMAX Take 1 capsule (0.4 mg total) by mouth daily after supper.       Allergies:  Allergies  Allergen Reactions  . Other     Pt. Reports having seizures after anesthesia once 1999 or 2000 none since  . Keflex [Cephalexin] Rash    Family History  Problem Relation Age of Onset  . Heart disease Other   . Cancer Other   . Kidney disease Other   . Heart disease Father   . Prostate cancer Father   . Heart disease Mother   . Lung cancer Mother   . Kidney cancer Mother   . Prostate cancer Maternal Uncle   . Bladder Cancer  Maternal Grandfather   . Breast cancer Neg Hx   . Colon cancer Neg Hx   . Pancreatic cancer Neg Hx     Social History:  reports that he quit smoking about 18 years ago. His smoking use included cigarettes. He started smoking about 50 years ago. He has a 48.00 pack-year smoking history. He has never used smokeless tobacco. He reports current alcohol use. He reports that he does not use drugs.  ROS: A complete review of systems was performed.  All systems are negative except for pertinent findings as  noted.  Physical Exam:  Vital signs in last 24 hours: BP (!) 181/96   Pulse 91  Constitutional:  Alert and oriented, No acute distress Cardiovascular: Regular rate  Respiratory: Normal respiratory effort Neurologic: Grossly intact, no focal deficits Psychiatric: Normal mood and affect  I have reviewed prior pt notes  I have reviewed urinalysis results  I have reviewed prior PSA/pathology results   Impression/Assessment:  Favorable intermediate risk prostate cancer, 4 months out from brachytherapy.  He is doing quite well with minimal lower urinary symptomatology, but on tamsulosin  Plan:  I will see back in 4 months following PSA

## 2021-03-10 NOTE — Progress Notes (Signed)
Urological Symptom Review  Patient is experiencing the following symptoms: Get up at night to urinate Stream starts and stops   Review of Systems  Gastrointestinal (upper)  : Negative for upper GI symptoms  Gastrointestinal (lower) : Negative for lower GI symptoms  Constitutional : Negative for symptoms  Skin: Negative for skin symptoms  Eyes: Negative for eye symptoms  Ear/Nose/Throat : Negative for Ear/Nose/Throat symptoms  Hematologic/Lymphatic: Negative for Hematologic/Lymphatic symptoms  Cardiovascular : Negative for cardiovascular symptoms  Respiratory : Negative for respiratory symptoms  Endocrine: Negative for endocrine symptoms  Musculoskeletal: Negative for musculoskeletal symptoms  Neurological: Negative for neurological symptoms  Psychologic: Negative for psychiatric symptoms 

## 2021-03-13 ENCOUNTER — Other Ambulatory Visit: Payer: Self-pay | Admitting: Family Medicine

## 2021-03-13 DIAGNOSIS — F411 Generalized anxiety disorder: Secondary | ICD-10-CM

## 2021-03-13 NOTE — Telephone Encounter (Signed)
Ok to refill??  Last office visit 08/29/2020  Last refill 02/13/2021 on both.   Letter sent to schedule follow up appointment.

## 2021-03-27 ENCOUNTER — Other Ambulatory Visit: Payer: Self-pay

## 2021-03-27 ENCOUNTER — Encounter: Payer: Self-pay | Admitting: Family Medicine

## 2021-03-27 ENCOUNTER — Ambulatory Visit (INDEPENDENT_AMBULATORY_CARE_PROVIDER_SITE_OTHER): Payer: PPO | Admitting: Family Medicine

## 2021-03-27 VITALS — BP 138/78 | HR 84 | Temp 98.2°F | Resp 14 | Ht 72.0 in | Wt 223.0 lb

## 2021-03-27 DIAGNOSIS — I10 Essential (primary) hypertension: Secondary | ICD-10-CM | POA: Diagnosis not present

## 2021-03-27 DIAGNOSIS — I2510 Atherosclerotic heart disease of native coronary artery without angina pectoris: Secondary | ICD-10-CM

## 2021-03-27 DIAGNOSIS — E7800 Pure hypercholesterolemia, unspecified: Secondary | ICD-10-CM | POA: Diagnosis not present

## 2021-03-27 DIAGNOSIS — N1832 Chronic kidney disease, stage 3b: Secondary | ICD-10-CM

## 2021-03-27 DIAGNOSIS — Z9861 Coronary angioplasty status: Secondary | ICD-10-CM | POA: Diagnosis not present

## 2021-03-27 MED ORDER — EZETIMIBE 10 MG PO TABS
10.0000 mg | ORAL_TABLET | Freq: Every day | ORAL | 3 refills | Status: DC
Start: 2021-03-27 — End: 2021-07-22

## 2021-03-27 MED ORDER — PANTOPRAZOLE SODIUM 40 MG PO TBEC: 40.0000 mg | DELAYED_RELEASE_TABLET | Freq: Two times a day (BID) | ORAL | 3 refills | Status: DC

## 2021-03-27 MED ORDER — FUROSEMIDE 20 MG PO TABS: 20.0000 mg | ORAL_TABLET | Freq: Every day | ORAL | 3 refills | Status: DC

## 2021-03-27 NOTE — Progress Notes (Signed)
Subjective:    Patient ID: Brandon Buck, male    DOB: 1961/06/24, 60 y.o.   MRN: [id number]  Since I last saw the patient, patient has been diagnosed with prostate cancer and underwent radioactive seed placement.  His most recent PSA was down to 0.8.  His urologist has been very happy with his progress.  He also was found to have thickening in the colon on the CT scan.  Colonoscopy revealed several polyps however they were tubular adenomas.  Repeat colonoscopy was recommended in 5 years.  Patient states he is doing well.  He feels like he is coming through everything fine and is doing much better now.  His blood pressure today is excellent.  Past medical history significant for chronic kidney disease with a baseline creatinine of around 1.5-1.6 as well as coronary artery disease and hypertension.  He is due to recheck fasting lab work.  He denies any chest pain shortness of breath or dyspnea on exertion Past Medical History:  Diagnosis Date  . Anxiety   . Arthritis   . Asthma   . Cancer [facility])    right renal cancer, right radical nephrectomy 2004  . Chronic back pain   . Chronic hip pain, left   . CKD (chronic kidney disease) stage 3, GFR 30-59 ml/min (HCC)    ckd stage 3 b   . Complication of anesthesia    1 seizure after anesthesia 1999 or 2000 none since , recent surgeries no seizures  . Coronary artery disease    a. s/p prior PCI to RCA and LCx in 2009  . Depression   . GERD (gastroesophageal reflux disease)   . Gout    no recent flares  . Heart disease   . High blood pressure   . History of PTCA 2 2004   2 stents  . History of stomach ulcers   . Hypercholesteremia   . Hypertension   . Kidney disease   . Myocardial infarction ([place]) 2004  . Prostate cancer ([place])    3/12 cores upt to 40% grade 2 cancer. Planning brachytherapy (2021)  . Proteinuria   . Renal insufficiency   . Tennis elbow    Past Surgical History:  Procedure Laterality Date  . COLONOSCOPY WITH PROPOFOL  N/A 10/28/2020   Procedure: COLONOSCOPY WITH PROPOFOL;  Surgeon: Harvel Quale, MD;  [facility];  Service: Gastroenterology;  Laterality: N/A;  2:30  . coloscopy  10/28/2020   removed 7 polyps done at [facility]  . CYSTOSCOPY N/A 11/03/2020   Procedure: CYSTOSCOPY;  Surgeon: Franchot Gallo, MD;  Location: [facility];  Service: Urology;  Laterality: N/A;  . Heart catherization  2004   2 stents to heart cypher left circulflex  . HERNIA REPAIR  1999   left groin bilateral  . HIP SURGERY Left 1999 or 2000   bond graft  . Kidney removed Right 2003 or 2004   for cancer  . POLYPECTOMY  10/28/2020   Procedure: POLYPECTOMY INTESTINAL;  Surgeon: Harvel Quale, MD;  [facility];  Service: Gastroenterology;;  . PROSTATE BIOPSY  2021  . RADIOACTIVE SEED IMPLANT N/A 11/03/2020   Procedure: RADIOACTIVE SEED IMPLANT/BRACHYTHERAPY IMPLANT;  Surgeon: Franchot Gallo, MD;  [facility];  Service: Urology;  Laterality: N/A;  90 MINS  . SPACE OAR INSTILLATION N/A 11/03/2020   Procedure: SPACE OAR INSTILLATION;  Surgeon: Franchot Gallo, MD;  Location: [facility];  Service: Urology;  Laterality:  N/A;   Current Outpatient Medications on File Prior to Visit  Medication Sig Dispense Refill  . allopurinol (ZYLOPRIM) 100 MG tablet TAKE 2 TABLETS BY MOUTH DAILY. 60 tablet 3  . ALPRAZolam (XANAX) 0.5 MG tablet TAKE (1) TABLET BY MOUTH TWICE DAILY AS NEEDED FOR ANXIETY 30 tablet 0  . aspirin 81 MG tablet Take 81 mg by mouth daily.    Marland Kitchen atorvastatin (LIPITOR) 80 MG tablet TAKE (1) TABLET BY MOUTH ONCE DAILY. (Patient taking differently: Take 80 mg by mouth daily.) 90 tablet 1  . fluticasone (FLONASE) 50 MCG/ACT nasal spray Place 2 sprays into both nostrils daily. 16 g 6  . HYDROcodone-acetaminophen (NORCO/VICODIN) 5-325 MG tablet TAKE 1 TABLET BY MOUTH THREE TIMES DAILY AS NEEDED FOR MODERATE PAIN. 90 tablet 0   . lisinopril (ZESTRIL) 20 MG tablet Take 20 mg by mouth daily.    . metoprolol tartrate (LOPRESSOR) 50 MG tablet Take 1 tablet (50 mg total) by mouth 2 (two) times daily. 180 tablet 3  . NICOTROL 10 MG inhaler INHALE 1 CARTRIDGE INTO THE LUNGS AS NEEDED FOR SMOKING CESSATION. 168 each 0  . nitroGLYCERIN (NITROSTAT) 0.4 MG SL tablet Place 1 tablet (0.4 mg total) under the tongue every 5 (five) minutes as needed. 25 tablet 3  . Phenazopyridine HCl (URISTAT PO) Take by mouth.    . tamsulosin (FLOMAX) 0.4 MG CAPS capsule Take 1 capsule (0.4 mg total) by mouth daily after supper. 90 capsule 11   No current facility-administered medications on file prior to visit.    Allergies  Allergen Reactions  . Other     Pt. Reports having seizures after anesthesia once 1999 or 2000 none since  . Keflex [Cephalexin] Rash   Social History   Socioeconomic History  . Marital status: Married    Spouse name: Not on file  . Number of children: 0  . Years of education: Not on file  . Highest education level: Not on file  Occupational History  . Occupation: retired  Tobacco Use  . Smoking status: Former Smoker    Packs/day: 1.50    Years: 32.00    Pack years: 48.00    Types: Cigarettes    Start date: 01/26/1971    Quit date: 01/26/2003    Years since quitting: 18.1  . Smokeless tobacco: Never Used  Vaping Use  . Vaping Use: Never used  Substance and Sexual Activity  . Alcohol use: Yes    Alcohol/week: 0.0 standard drinks    Comment: occ 2 week  . Drug use: No  . Sexual activity: Yes  Other Topics Concern  . Not on file  Social History Narrative  . Not on file   Social Determinants of Health   Financial Resource Strain: Low Risk   . Difficulty of Paying Living Expenses: Not very hard  Food Insecurity: Not on file  Transportation Needs: Not on file  Physical Activity: Not on file  Stress: Not on file  Social Connections: Not on file  Intimate Partner Violence: Not on file      Review  of Systems  All other systems reviewed and are negative.      Objective:   Physical Exam Vitals reviewed.  Constitutional:      General: He is not in acute distress.    Appearance: He is well-developed. He is not diaphoretic.  HENT:     Right Ear: External ear normal.     Left Ear: External ear normal.     Nose: Nose  normal.     Mouth/Throat:     Pharynx: No oropharyngeal exudate.  Eyes:     General: No scleral icterus.    Conjunctiva/sclera: Conjunctivae normal.  Cardiovascular:     Rate and Rhythm: Normal rate and regular rhythm.     Heart sounds: Normal heart sounds.  Pulmonary:     Effort: Pulmonary effort is normal. No respiratory distress.     Breath sounds: Normal breath sounds. No wheezing or rales.  Abdominal:     General: Bowel sounds are normal. There is no distension.     Palpations: Abdomen is soft.     Tenderness: There is no abdominal tenderness. There is no rebound.     Hernia: There is no hernia in the left inguinal area.  Genitourinary:    Penis: Normal.      Testes: Normal.        Right: Mass or tenderness not present.        Left: Mass or tenderness not present.  Musculoskeletal:     Cervical back: Neck supple.  Lymphadenopathy:     Cervical: No cervical adenopathy.           Assessment & Plan:  ASCVD (arteriosclerotic cardiovascular disease) - Plan: COMPLETE METABOLIC PANEL WITH GFR, Lipid panel  Stage 3b chronic kidney disease ([place])  Pure hypercholesterolemia  History of PTCA 2  Benign essential HTN  Blood pressure today is excellent.  Check a CMP to monitor renal function.  Check a fasting lipid panel.  Goal LDL cholesterol is less than 70.  I congratulated the patient on his good report from his prostate cancer treatment.  I also congratulated him on the reassuring findings on his colonoscopy.

## 2021-03-28 LAB — LIPID PANEL
Cholesterol: 140 mg/dL (ref <200)
HDL: 51 mg/dL (ref >=40)
LDL Cholesterol (Calc): 68 mg/dL (calc)
Non-HDL Cholesterol (Calc): 89 mg/dL (calc) (ref <130)
Total CHOL/HDL Ratio: 2.7 (calc) (ref <5.0)
Triglycerides: 125 mg/dL (ref <150)

## 2021-03-28 LAB — COMPLETE METABOLIC PANEL WITH GFR
AG Ratio: 1.7 (calc) (ref 1.0–2.5)
ALT: 25 U/L (ref 9–46)
AST: 26 U/L (ref 10–35)
Albumin: 4.4 g/dL (ref 3.6–5.1)
Alkaline phosphatase (APISO): 83 U/L (ref 35–144)
BUN/Creatinine Ratio: 8 (calc) (ref 6–22)
BUN: 11 mg/dL (ref 7–25)
CO2: 28 mmol/L (ref 20–32)
Calcium: 9.7 mg/dL (ref 8.6–10.3)
Chloride: 103 mmol/L (ref 98–110)
Creat: 1.46 mg/dL — ABNORMAL HIGH (ref 0.70–1.25)
GFR, Est African American: 59 mL/min/{1.73_m2} — ABNORMAL LOW (ref >=60)
GFR, Est Non African American: 51 mL/min/{1.73_m2} — ABNORMAL LOW (ref >=60)
Globulin: 2.6 g/dL (calc) (ref 1.9–3.7)
Glucose, Bld: 87 mg/dL (ref 65–99)
Potassium: 4.6 mmol/L (ref 3.5–5.3)
Sodium: 138 mmol/L (ref 135–146)
Total Bilirubin: 0.8 mg/dL (ref 0.2–1.2)
Total Protein: 7 g/dL (ref 6.1–8.1)

## 2021-03-31 ENCOUNTER — Encounter: Payer: Self-pay | Admitting: *Deleted

## 2021-04-03 ENCOUNTER — Telehealth: Payer: Self-pay | Admitting: Pharmacist

## 2021-04-03 NOTE — Progress Notes (Addendum)
    Chronic Care Management Pharmacy Assistant   Name: Brandon Buck  MRN: [id number] DOB: 1961-09-29  Reason for Encounter: Adherence Review   Conditions to be addressed/monitored: CKD, Gout, HTN, HLD.  Recent office visits:  03/27/21 Dr. Dennard Schaumann For follow-up. STOPPED Diltiazem.   Recent consult visits:  03/10/21 Urology Franchot Gallo, MD. For follow-up. No medication changes.  01/21/21 Cardiology Strader, Fransisco Hertz, PA-C. No medication changes. 12/24/20 Cardiology Isaiah Serge, NP. For follow-up.INCREASED Lopressor to 50 mg Daily  12/09/20 Urology Dahlstedt, Annie Main, MD.For follow-up. STARTED Tamsulosin 0.4 mg daily after supper.  Hospital visits:  None since 12/03/20  Medications: Outpatient Encounter Medications as of 04/03/2021  Medication Sig Note   allopurinol (ZYLOPRIM) 100 MG tablet TAKE 2 TABLETS BY MOUTH DAILY.    ALPRAZolam (XANAX) 0.5 MG tablet TAKE (1) TABLET BY MOUTH TWICE DAILY AS NEEDED FOR ANXIETY    aspirin 81 MG tablet Take 81 mg by mouth daily.    atorvastatin (LIPITOR) 80 MG tablet TAKE (1) TABLET BY MOUTH ONCE DAILY. (Patient taking differently: Take 80 mg by mouth daily.)    ezetimibe (ZETIA) 10 MG tablet Take 1 tablet (10 mg total) by mouth daily.    fluticasone (FLONASE) 50 MCG/ACT nasal spray Place 2 sprays into both nostrils daily. 10/02/2020: Per patient as needed.   furosemide (LASIX) 20 MG tablet Take 1 tablet (20 mg total) by mouth daily.    HYDROcodone-acetaminophen (NORCO/VICODIN) 5-325 MG tablet TAKE 1 TABLET BY MOUTH THREE TIMES DAILY AS NEEDED FOR MODERATE PAIN.    lisinopril (ZESTRIL) 20 MG tablet Take 20 mg by mouth daily.    metoprolol tartrate (LOPRESSOR) 50 MG tablet Take 1 tablet (50 mg total) by mouth 2 (two) times daily.    NICOTROL 10 MG inhaler INHALE 1 CARTRIDGE INTO THE LUNGS AS NEEDED FOR SMOKING CESSATION.    nitroGLYCERIN (NITROSTAT) 0.4 MG SL tablet Place 1 tablet (0.4 mg total) under the tongue every 5 (five) minutes as needed.     pantoprazole (PROTONIX) 40 MG tablet Take 1 tablet (40 mg total) by mouth 2 (two) times daily.    Phenazopyridine HCl (URISTAT PO) Take by mouth.    tamsulosin (FLOMAX) 0.4 MG CAPS capsule Take 1 capsule (0.4 mg total) by mouth daily after supper.    No facility-administered encounter medications on file as of 04/03/2021.    Adherence Review Patient stated he is actively taking his medication as prescribed. He stated he gets all of his medication from [place], he stated he recently got refills from them. He stated his most recent lipid panel results were excellent from taking his atorvastatin as prescribed.   Star Rating Drugs: Atorvastatin 80 mg 90 DS 08/15/20, Lisinopril 20 mg 30 DS 11/26/20.  Follow-Up:Pharmacist Review   Charlann Lange, RMA Clinical Pharmacist Assistant [phone]  10 minutes spent in review, coordination, and documentation.  Reviewed by: Beverly Milch, PharmD Clinical Pharmacist [facility] [phone]

## 2021-04-15 ENCOUNTER — Other Ambulatory Visit: Payer: Self-pay | Admitting: Family Medicine

## 2021-04-15 DIAGNOSIS — F411 Generalized anxiety disorder: Secondary | ICD-10-CM

## 2021-04-15 NOTE — Telephone Encounter (Signed)
Ok to refill??  Last office visit 03/27/2021.  Last refill 03/13/2021 on both.

## 2021-04-28 ENCOUNTER — Other Ambulatory Visit: Payer: Self-pay | Admitting: Family Medicine

## 2021-04-28 ENCOUNTER — Telehealth: Payer: Self-pay | Admitting: *Deleted

## 2021-04-28 NOTE — Telephone Encounter (Signed)
Received call from patient. [phone]~ telephone.   Reports that he has L knee pain. States that he does not think his gout is in flare. Reports that he believes arthritis is causing ache to bone.   Inquired if PCP could order prednisone taper for inflammation.   Appointment scheduled for Thursday for evaluation.

## 2021-04-28 NOTE — Telephone Encounter (Signed)
Call placed to patient. LMTRC.  

## 2021-04-29 NOTE — Telephone Encounter (Signed)
Patient returned call and made aware.   States that he does not think he will be able to tolerate injection into joint at this time.   Advised to at least keep appointment for evaluation to determine next steps.

## 2021-04-29 NOTE — Telephone Encounter (Signed)
Call placed to patient. LMTRC.  

## 2021-04-30 ENCOUNTER — Ambulatory Visit: Payer: Self-pay | Admitting: Family Medicine

## 2021-05-07 ENCOUNTER — Ambulatory Visit: Payer: Self-pay | Admitting: Family Medicine

## 2021-05-11 ENCOUNTER — Other Ambulatory Visit: Payer: Self-pay

## 2021-05-11 ENCOUNTER — Encounter: Payer: Self-pay | Admitting: Emergency Medicine

## 2021-05-11 ENCOUNTER — Ambulatory Visit
Admission: EM | Admit: 2021-05-11 | Discharge: 2021-05-11 | Disposition: A | Discharge status: Home / Self Care | Payer: PPO | Attending: Family Medicine | Admitting: Family Medicine

## 2021-05-11 DIAGNOSIS — M10062 Idiopathic gout, left knee: Secondary | ICD-10-CM | POA: Diagnosis not present

## 2021-05-11 DIAGNOSIS — L409 Psoriasis, unspecified: Secondary | ICD-10-CM

## 2021-05-11 MED ORDER — TRIAMCINOLONE ACETONIDE 0.1 % EX CREA: 1.0000 "application " | TOPICAL_CREAM | Freq: Two times a day (BID) | CUTANEOUS | 0 refills | Status: DC

## 2021-05-11 MED ORDER — PREDNISONE 20 MG PO TABS: ORAL_TABLET | ORAL | 0 refills | Status: DC

## 2021-05-11 NOTE — ED Triage Notes (Signed)
LT knee pain x 5 weeks.  Hx of gout.  Denies any injury.  Pt takes allopurinol daily.

## 2021-05-11 NOTE — ED Provider Notes (Signed)
[facility]    CSN: [id number] Arrival date & time: 05/11/21  0934      History   Chief Complaint No chief complaint on file.   HPI Brandon Buck is a 60 y.o. male.   HPI Patient with history of left knee without injury.  Patient reports over the last 5 weeks he has experienced pain in the left knee in the MCL region.  He also has had swelling after standing on his feet for periods of time.  The swelling seems to resolve at times with rest but the pain is always there.  He has had a previous procedure done on his hip related to arthritis however has never experienced any chronic recurrent pain in the left knee.  He has been taken over-the-counter medication without any relief of pain.  He has a history of gout and reports current pain feels similar to that of previous episodes of gout.      Past Medical History:  Diagnosis Date   Anxiety    Arthritis    Asthma    Cancer ([place])    right renal cancer, right radical nephrectomy 2004   Chronic back pain    Chronic hip pain, left    CKD (chronic kidney disease) stage 3, GFR 30-59 ml/min (HCC)    ckd stage 3 b    Complication of anesthesia    1 seizure after anesthesia 1999 or 2000 none since , recent surgeries no seizures   Coronary artery disease    a. s/p prior PCI to RCA and LCx in 2009   Depression    GERD (gastroesophageal reflux disease)    Gout    no recent flares   Heart disease    High blood pressure    History of PTCA 2 2004   2 stents   History of stomach ulcers    Hypercholesteremia    Hypertension    Kidney disease    Myocardial infarction ([place]) 2004   Prostate cancer ([place])    3/12 cores upt to 40% grade 2 cancer. Planning brachytherapy (2021)   Proteinuria    Renal insufficiency    Tennis elbow     Patient Active Problem List   Diagnosis Date Noted   Abnormal abdominal CT scan 10/02/2020   Malignant neoplasm of prostate ([place]) 09/02/2020   Essential hypertension 10/05/2019    Hyperkalemia 10/05/2019   CKD (chronic kidney disease) stage 3, GFR 30-59 ml/min (HCC)    Proteinuria    History of PTCA 2    Gout    Plantar fasciitis 03/01/2012   HIP PAIN 11/11/2008   ASEPTIC NECROSIS 11/11/2008    Past Surgical History:  Procedure Laterality Date   COLONOSCOPY WITH PROPOFOL N/A 10/28/2020   Procedure: COLONOSCOPY WITH PROPOFOL;  Surgeon: Harvel Quale, MD;  [facility];  Service: Gastroenterology;  Laterality: N/A;  2:30   coloscopy  10/28/2020   removed 7 polyps done at [place] N/A 11/03/2020   Procedure: CYSTOSCOPY;  Surgeon: Franchot Gallo, MD;  Location[facility];  Service: Urology;  Laterality: N/A;   Heart catherization  2004   2 stents to heart cypher left circulflex   HERNIA REPAIR  1999   left groin bilateral   HIP SURGERY Left 1999 or 2000   bond graft   Kidney removed Right 2003 or 2004   for cancer   POLYPECTOMY  10/28/2020   Procedure: POLYPECTOMY INTESTINAL;  Surgeon: Harvel Quale, MD;  [facility];  Service: Gastroenterology;;   PROSTATE BIOPSY  2021   RADIOACTIVE SEED IMPLANT N/A 11/03/2020   Procedure: RADIOACTIVE SEED IMPLANT/BRACHYTHERAPY IMPLANT;  Surgeon: Franchot Gallo, MD;  Location: [facility];  Service: Urology;  Laterality: N/A;  [place] N/A 11/03/2020   Procedure: SPACE OAR INSTILLATION;  Surgeon: Franchot Gallo, MD;  Location: [facility];  Service: Urology;  Laterality: N/A;       Home Medications    Prior to Admission medications   Medication Sig Start Date End Date Taking? Authorizing Provider  predniSONE (DELTASONE) 20 MG tablet Take 3 PO QAM x3days, 2 PO QAM x3days, 1 PO QAM x3days 05/11/21  Yes Scot Jun, FNP  triamcinolone cream (KENALOG) 0.1 % Apply 1 application topically 2 (two) times daily. 05/11/21  Yes Scot Jun, FNP  allopurinol (ZYLOPRIM) 100 MG tablet  TAKE 2 TABLETS BY MOUTH DAILY. 01/13/21   [name], MD  ALPRAZolam Duanne Moron) 0.5 MG tablet TAKE (1) TABLET BY MOUTH TWICE DAILY AS NEEDED FOR ANXIETY 04/16/21   [name], MD  aspirin 81 MG tablet Take 81 mg by mouth daily.    [provider]  atorvastatin (LIPITOR) 80 MG tablet TAKE (1) TABLET BY MOUTH ONCE DAILY. 04/28/21   [name], MD  ezetimibe (ZETIA) 10 MG tablet Take 1 tablet (10 mg total) by mouth daily. 03/27/21   [name], MD  fluticasone (FLONASE) 50 MCG/ACT nasal spray Place 2 sprays into both nostrils daily. 01/29/19   [name], MD  furosemide (LASIX) 20 MG tablet Take 1 tablet (20 mg total) by mouth daily. 03/27/21   [name], MD  HYDROcodone-acetaminophen (NORCO/VICODIN) 5-325 MG tablet TAKE 1 TABLET BY MOUTH THREE TIMES DAILY AS NEEDED FOR MODERATE PAIN. 04/16/21   [name], MD  lisinopril (ZESTRIL) 20 MG tablet Take 20 mg by mouth daily.    [provider]  metoprolol tartrate (LOPRESSOR) 50 MG tablet Take 1 tablet (50 mg total) by mouth 2 (two) times daily. 12/24/20 03/24/21  Isaiah Serge, NP  NICOTROL 10 MG inhaler INHALE 1 CARTRIDGE INTO THE LUNGS AS NEEDED FOR SMOKING CESSATION. 09/02/20   [name], MD  nitroGLYCERIN (NITROSTAT) 0.4 MG SL tablet Place 1 tablet (0.4 mg total) under the tongue every 5 (five) minutes as needed. 12/24/20   Isaiah Serge, NP  pantoprazole (PROTONIX) 40 MG tablet Take 1 tablet (40 mg total) by mouth 2 (two) times daily. 03/27/21   [name], MD  Phenazopyridine HCl (URISTAT PO) Take by mouth.    [provider]  tamsulosin (FLOMAX) 0.4 MG CAPS capsule Take 1 capsule (0.4 mg total) by mouth daily after supper. 12/09/20   Franchot Gallo, MD    Family History Family History  Problem Relation Age of Onset   Heart disease Other    Cancer Other    Kidney disease Other    Heart disease Father    Prostate cancer Father    Heart disease Mother    Lung cancer  Mother    Kidney cancer Mother    Prostate cancer Maternal Uncle    Bladder Cancer Maternal Grandfather    Breast cancer Neg Hx    Colon cancer Neg Hx    Pancreatic cancer Neg Hx     Social History Social History   Tobacco Use   Smoking status: Former    Packs/day: 1.50  Years: 32.00    Pack years: 48.00    Types: Cigarettes    Start date: 01/26/1971    Quit date: 01/26/2003    Years since quitting: 18.3   Smokeless tobacco: Never  Vaping Use   Vaping Use: Never used  Substance Use Topics   Alcohol use: Yes    Alcohol/week: 0.0 standard drinks    Comment: occ 2 week   Drug use: No     Allergies   Other and Keflex [cephalexin]   Review of Systems Review of Systems Pertinent negatives listed in HPI   Physical Exam Triage Vital Signs ED Triage Vitals  Enc Vitals Group     BP 05/11/21 1156 (!) 185/94     Pulse Rate 05/11/21 1156 86     Resp 05/11/21 1156 18     Temp 05/11/21 1156 98 F (36.7 C)     Temp Source 05/11/21 1156 Oral     SpO2 05/11/21 1156 97 %     Weight --      Height --      Head Circumference --      Peak Flow --      Pain Score 05/11/21 1157 8     Pain Loc --      Pain Edu? --      Excl. in [place]? --    No data found.  Updated Vital Signs BP (!) 185/94 (BP Location: Right Arm)   Pulse 86   Temp 98 F (36.7 C) (Oral)   Resp 18   SpO2 97%   Visual Acuity Right Eye Distance:   Left Eye Distance:   Bilateral Distance:    Right Eye Near:   Left Eye Near:    Bilateral Near:     Physical Exam Constitutional:      Appearance: Normal appearance.  Cardiovascular:     Rate and Rhythm: Normal rate and regular rhythm.  Pulmonary:     Effort: Pulmonary effort is normal.     Breath sounds: Normal breath sounds.  Musculoskeletal:     Left knee: Swelling present. Decreased range of motion. Tenderness present over the MCL.  Skin:    Capillary Refill: Capillary refill takes less than 2 seconds.  Neurological:     Mental Status: He is  alert.  Psychiatric:        Mood and Affect: Mood normal.        Behavior: Behavior normal.        Thought Content: Thought content normal.        Judgment: Judgment normal.      UC Treatments / Results  Labs (all labs ordered are listed, but only abnormal results are displayed) Labs Reviewed - No data to display  EKG   Radiology No results found.  Procedures Procedures (including critical care time)  Medications Ordered in UC Medications - No data to display  Initial Impression / Assessment and Plan / UC Course  I have reviewed the triage vital signs and the nursing notes.  Pertinent labs & imaging results that were available during my care of the patient were reviewed by me and considered in my medical decision making (see chart for details).     Acute idiopathic gout involving the left knee treatment today prednisone taper.  Patient will continue Tylenol as needed.  For psoriasis I have filled a prescription for triamcinolone cream patient to apply twice daily as needed.  Symptoms persist follow-up with Dr. Aline Brochure for further evaluation of knee pain. Final  Clinical Impressions(s) / UC Diagnoses   Final diagnoses:  Acute idiopathic gout of left knee  Psoriasis   Discharge Instructions   None    ED Prescriptions     Medication Sig Dispense Auth. Provider   predniSONE (DELTASONE) 20 MG tablet Take 3 PO QAM x3days, 2 PO QAM x3days, 1 PO QAM x3days 18 tablet Scot Jun, FNP   triamcinolone cream (KENALOG) 0.1 % Apply 1 application topically 2 (two) times daily. 454 g Scot Jun, FNP      PDMP not reviewed this encounter.   Scot Jun, FNP 05/11/21 1840

## 2021-05-13 ENCOUNTER — Other Ambulatory Visit: Payer: Self-pay | Admitting: Family Medicine

## 2021-05-13 DIAGNOSIS — F411 Generalized anxiety disorder: Secondary | ICD-10-CM

## 2021-05-14 ENCOUNTER — Other Ambulatory Visit: Payer: Self-pay

## 2021-05-14 DIAGNOSIS — F411 Generalized anxiety disorder: Secondary | ICD-10-CM

## 2021-05-14 MED ORDER — HYDROCODONE-ACETAMINOPHEN 5-325 MG PO TABS: ORAL_TABLET | ORAL | 0 refills | Status: DC

## 2021-05-14 MED ORDER — ALPRAZOLAM 0.5 MG PO TABS
ORAL_TABLET | ORAL | 0 refills | Status: DC
Start: 2021-05-14 — End: 2021-06-15

## 2021-05-14 NOTE — Telephone Encounter (Signed)
Please deny as prescriptions were sent to pharmacy on 05/14/2021.

## 2021-05-28 ENCOUNTER — Telehealth: Payer: Self-pay | Admitting: Pharmacist

## 2021-05-28 NOTE — Progress Notes (Addendum)
Chronic Care Management Pharmacy Assistant   Name: Brandon Buck  MRN: [id number] DOB: [birth year]   Reason for Encounter: Disease State For HTN   Conditions to be addressed/monitored: CKD, Gout, HTN, HLD.  Recent office visits:  None since 04/03/21  Recent consult visits:  None since 04/03/21  Hospital visits:  05/11/21 [facility]) For Gout of left knee. STARTED Prednisone 20 mg pack, Triamcinolone Acetonide 0.1% 2 times daily.   Medications: Outpatient Encounter Medications as of 05/28/2021  Medication Sig Note   allopurinol (ZYLOPRIM) 100 MG tablet TAKE 2 TABLETS BY MOUTH DAILY.    ALPRAZolam (XANAX) 0.5 MG tablet TAKE (1) TABLET BY MOUTH TWICE DAILY AS NEEDED FOR ANXIETY    aspirin 81 MG tablet Take 81 mg by mouth daily.    atorvastatin (LIPITOR) 80 MG tablet TAKE (1) TABLET BY MOUTH ONCE DAILY.    ezetimibe (ZETIA) 10 MG tablet Take 1 tablet (10 mg total) by mouth daily.    fluticasone (FLONASE) 50 MCG/ACT nasal spray Place 2 sprays into both nostrils daily. 10/02/2020: Per patient as needed.   furosemide (LASIX) 20 MG tablet Take 1 tablet (20 mg total) by mouth daily.    HYDROcodone-acetaminophen (NORCO/VICODIN) 5-325 MG tablet TAKE 1 TABLET BY MOUTH THREE TIMES DAILY AS NEEDED FOR MODERATE PAIN.    lisinopril (ZESTRIL) 20 MG tablet Take 20 mg by mouth daily.    metoprolol tartrate (LOPRESSOR) 50 MG tablet Take 1 tablet (50 mg total) by mouth 2 (two) times daily.    NICOTROL 10 MG inhaler INHALE 1 CARTRIDGE INTO THE LUNGS AS NEEDED FOR SMOKING CESSATION.    nitroGLYCERIN (NITROSTAT) 0.4 MG SL tablet Place 1 tablet (0.4 mg total) under the tongue every 5 (five) minutes as needed.    pantoprazole (PROTONIX) 40 MG tablet Take 1 tablet (40 mg total) by mouth 2 (two) times daily.    Phenazopyridine HCl (URISTAT PO) Take by mouth.    predniSONE (DELTASONE) 20 MG tablet Take 3 PO QAM x3days, 2 PO QAM x3days, 1 PO QAM x3days    tamsulosin (FLOMAX)  0.4 MG CAPS capsule Take 1 capsule (0.4 mg total) by mouth daily after supper.    triamcinolone cream (KENALOG) 0.1 % Apply 1 application topically 2 (two) times daily.    No facility-administered encounter medications on file as of 05/28/2021.   Reviewed chart prior to disease state call. Spoke with patient regarding BP  Recent Office Vitals: BP Readings from Last 3 Encounters:  05/11/21 (!) 185/94  03/27/21 138/78  03/10/21 (!) 181/96   Pulse Readings from Last 3 Encounters:  05/11/21 86  03/27/21 84  03/10/21 91    Wt Readings from Last 3 Encounters:  03/27/21 223 lb (101.2 kg)  01/21/21 223 lb (101.2 kg)  12/24/20 219 lb (99.3 kg)     Kidney Function Lab Results  Component Value Date/Time   CREATININE 1.46 (H) 03/27/2021 12:37 PM   CREATININE 1.51 (H) 10/29/2020 10:21 AM   CREATININE 1.49 (H) 10/27/2020 10:23 AM   CREATININE 1.76 (H) 08/29/2020 11:27 AM   GFRNONAA 51 (L) 03/27/2021 12:37 PM   GFRAA 59 (L) 03/27/2021 12:37 PM    BMP Latest Ref Rng & Units 03/27/2021 10/29/2020 10/27/2020  Glucose 65 - 99 mg/dL 87 79 90  BUN 7 - 25 mg/dL 11 15 14   Creatinine 0.70 - 1.25 mg/dL 1.46(H) 1.51(H) 1.49(H)  BUN/Creat Ratio 6 - 22 (calc) 8 - -  Sodium 135 - 146  mmol/L 138 140 136  Potassium 3.5 - 5.3 mmol/L 4.6 4.1 4.1  Chloride 98 - 110 mmol/L 103 107 104  CO2 20 - 32 mmol/L 28 23 22   Calcium 8.6 - 10.3 mg/dL 9.7 9.8 9.2    Current antihypertensive regimen:  Metoprolol 50 mg 1 tablet by mouth 2 times daily. Lisinopril 20 mg Take 20 mg by mouth daily  How often are you checking your Blood Pressure? Patient stated infrequently. We discussed him starting back checking his blood pressure at home.    Current home BP readings: N/A.  What recent interventions/DTPs have been made by any provider to improve Blood Pressure control since last CPP Visit: None.   Any recent hospitalizations or ED visits since last visit with CPP? Yes, Documented above.  What diet changes have been  made to improve Blood Pressure Control?  Patient stated his diet is not good, he's gained a few pounds. He stated he drinks plenty of fluids.   What exercise is being done to improve your Blood Pressure Control?  He stated he is always doing things outside but no regular exercise.  Adherence Review: Is the patient currently on ACE/ARB medication? Lisinopril 20 mg  Does the patient have >5 day gap between last estimated fill dates? Per misc rpts, yes but the patient stated he is taking his medication as prescribed.   Star Rating Drugs: Atorvastatin 80 mg 90 DS 04/28/21, Lisinopril 20 mg 30 DS 11/26/20.   Follow-Up:Pharmacist Review  Charlann Lange, RMA Clinical Pharmacist Assistant [phone]  10 minutes spent in review, coordination, and documentation.  Reviewed by: Beverly Milch, PharmD Clinical Pharmacist [phone]

## 2021-06-15 ENCOUNTER — Other Ambulatory Visit: Payer: Self-pay | Admitting: Family Medicine

## 2021-06-15 DIAGNOSIS — F411 Generalized anxiety disorder: Secondary | ICD-10-CM

## 2021-06-24 DIAGNOSIS — D631 Anemia in chronic kidney disease: Secondary | ICD-10-CM | POA: Diagnosis not present

## 2021-06-24 DIAGNOSIS — E559 Vitamin D deficiency, unspecified: Secondary | ICD-10-CM | POA: Diagnosis not present

## 2021-06-24 DIAGNOSIS — N1832 Chronic kidney disease, stage 3b: Secondary | ICD-10-CM | POA: Diagnosis not present

## 2021-06-24 DIAGNOSIS — Z79899 Other long term (current) drug therapy: Secondary | ICD-10-CM | POA: Diagnosis not present

## 2021-06-24 DIAGNOSIS — R809 Proteinuria, unspecified: Secondary | ICD-10-CM | POA: Diagnosis not present

## 2021-06-26 DIAGNOSIS — R809 Proteinuria, unspecified: Secondary | ICD-10-CM | POA: Diagnosis not present

## 2021-06-26 DIAGNOSIS — E559 Vitamin D deficiency, unspecified: Secondary | ICD-10-CM | POA: Diagnosis not present

## 2021-06-26 DIAGNOSIS — N1831 Chronic kidney disease, stage 3a: Secondary | ICD-10-CM | POA: Diagnosis not present

## 2021-06-26 DIAGNOSIS — I129 Hypertensive chronic kidney disease with stage 1 through stage 4 chronic kidney disease, or unspecified chronic kidney disease: Secondary | ICD-10-CM | POA: Diagnosis not present

## 2021-07-07 ENCOUNTER — Other Ambulatory Visit: Payer: PPO

## 2021-07-07 ENCOUNTER — Other Ambulatory Visit: Payer: Self-pay

## 2021-07-07 DIAGNOSIS — C61 Malignant neoplasm of prostate: Secondary | ICD-10-CM

## 2021-07-08 LAB — PSA: Prostate Specific Ag, Serum: 0.7 ng/mL (ref 0.0–4.0)

## 2021-07-13 NOTE — Progress Notes (Signed)
6.29.2021: 60 year old male sent by Dr. Dennard Schaumann for evaluation of elevated PSA.  PSA was 5.3 on 4.8.2021.  In 2017, PSA was 3.10.  He does have a history of renal cell carcinoma and is status post remote right radical nephrectomy.   There is a strong family history of prostate cancer, in his father and grandfather.  Apparently they both died of the illness.   2020-06-07:He underwent ultrasound and biopsy of his prostate  Prostate volume 34.7 mL, PSA 5.3, PSAD 0.15.  3/12 cores revealed adenocarcinoma as follows:  Left apex lateral GS 3+3 and 95% of core Right apex medial GS 3+4 and 40% of core Right mid lateral, GS 3+3 in 10% of core    1.10.2022: I-125 brachytherapy, placement of SpaceOAR   9.20.2022: PSA 0.7. On flomax. IPSS 10   QoL score 2. No blood in urine or stool.    Past Medical History:  Diagnosis Date   Anxiety    Arthritis    Asthma    Cancer ([place])    right renal cancer, right radical nephrectomy 2004   Chronic back pain    Chronic hip pain, left    CKD (chronic kidney disease) stage 3, GFR 30-59 ml/min (HCC)    ckd stage 3 b    Complication of anesthesia    1 seizure after anesthesia 1999 or 2000 none since , recent surgeries no seizures   Coronary artery disease    a. s/p prior PCI to RCA and LCx in 2009   Depression    GERD (gastroesophageal reflux disease)    Gout    no recent flares   Heart disease    High blood pressure    History of PTCA 2 2004   2 stents   History of stomach ulcers    Hypercholesteremia    Hypertension    Kidney disease    Myocardial infarction ([place]) 2004   Prostate cancer ([place])    3/12 cores upt to 40% grade 2 cancer. Planning brachytherapy (2021)   Proteinuria    Renal insufficiency    Tennis elbow     Past Surgical History:  Procedure Laterality Date   COLONOSCOPY WITH PROPOFOL N/A 10/28/2020   Procedure: COLONOSCOPY WITH PROPOFOL;  Surgeon: Harvel Quale, MD;  [facility];  Service: Gastroenterology;   Laterality: N/A;  2:30   coloscopy  10/28/2020   removed 7 polyps done at [facility] N/A 11/03/2020   Procedure: CYSTOSCOPY;  Surgeon: Franchot Gallo, MD;  Location[facility];  Service: Urology;  Laterality: N/A;   Heart catherization  2004   2 stents to heart cypher left circulflex   HERNIA REPAIR  1999   left groin bilateral   HIP SURGERY Left 1999 or 2000   bond graft   Kidney removed Right 2003 or 2004   for cancer   POLYPECTOMY  10/28/2020   Procedure: POLYPECTOMY INTESTINAL;  Surgeon: Harvel Quale, MD;  Location: [facility];  Service: Gastroenterology;;   PROSTATE BIOPSY  2021   RADIOACTIVE SEED IMPLANT N/A 11/03/2020   Procedure: RADIOACTIVE SEED IMPLANT/BRACHYTHERAPY IMPLANT;  Surgeon: Franchot Gallo, MD;  Location: [facility];  Service: Urology;  Laterality: N/A;  [place] N/A 11/03/2020   Procedure: SPACE OAR INSTILLATION;  Surgeon: Franchot Gallo, MD;  Location[facility];  Service: Urology;  Laterality: N/A;    Home Medications:  Allergies as of 07/14/2021  Reactions   Other    Pt. Reports having seizures after anesthesia once 1999 or 2000 none since   Keflex [cephalexin] Rash        Medication List        Accurate as of July 13, 2021  8:55 PM. If you have any questions, ask your nurse or doctor.          allopurinol 100 MG tablet Commonly known as: ZYLOPRIM TAKE 2 TABLETS BY MOUTH DAILY.   ALPRAZolam 0.5 MG tablet Commonly known as: XANAX TAKE (1) TABLET BY MOUTH TWICE DAILY AS NEEDED FOR ANXIETY   aspirin 81 MG tablet Take 81 mg by mouth daily.   atorvastatin 80 MG tablet Commonly known as: LIPITOR TAKE (1) TABLET BY MOUTH ONCE DAILY.   ezetimibe 10 MG tablet Commonly known as: ZETIA Take 1 tablet (10 mg total) by mouth daily.   fluticasone 50 MCG/ACT nasal spray Commonly known as: FLONASE Place 2 sprays into both  nostrils daily.   furosemide 20 MG tablet Commonly known as: LASIX Take 1 tablet (20 mg total) by mouth daily.   HYDROcodone-acetaminophen 5-325 MG tablet Commonly known as: NORCO/VICODIN TAKE 1 TABLET BY MOUTH THREE TIMES DAILY AS NEEDED FOR MODERATE PAIN.   lisinopril 20 MG tablet Commonly known as: ZESTRIL Take 20 mg by mouth daily.   metoprolol tartrate 50 MG tablet Commonly known as: LOPRESSOR Take 1 tablet (50 mg total) by mouth 2 (two) times daily.   Nicotrol 10 MG inhaler Generic drug: nicotine INHALE 1 CARTRIDGE INTO THE LUNGS AS NEEDED FOR SMOKING CESSATION.   nitroGLYCERIN 0.4 MG SL tablet Commonly known as: NITROSTAT Place 1 tablet (0.4 mg total) under the tongue every 5 (five) minutes as needed.   pantoprazole 40 MG tablet Commonly known as: PROTONIX Take 1 tablet (40 mg total) by mouth 2 (two) times daily.   predniSONE 20 MG tablet Commonly known as: DELTASONE Take 3 PO QAM x3days, 2 PO QAM x3days, 1 PO QAM x3days   tamsulosin 0.4 MG Caps capsule Commonly known as: FLOMAX Take 1 capsule (0.4 mg total) by mouth daily after supper.   triamcinolone cream 0.1 % Commonly known as: KENALOG Apply 1 application topically 2 (two) times daily.   URISTAT PO Take by mouth.        Allergies:  Allergies  Allergen Reactions   Other     Pt. Reports having seizures after anesthesia once 1999 or 2000 none since   Keflex [Cephalexin] Rash    Family History  Problem Relation Age of Onset   Heart disease Other    Cancer Other    Kidney disease Other    Heart disease Father    Prostate cancer Father    Heart disease Mother    Lung cancer Mother    Kidney cancer Mother    Prostate cancer Maternal Uncle    Bladder Cancer Maternal Grandfather    Breast cancer Neg Hx    Colon cancer Neg Hx    Pancreatic cancer Neg Hx     Social History:  reports that he quit smoking about 18 years ago. His smoking use included cigarettes. He started smoking about 50 years  ago. He has a 48.00 pack-year smoking history. He has never used smokeless tobacco. He reports current alcohol use. He reports that he does not use drugs.  ROS: A complete review of systems was performed.  All systems are negative except for pertinent findings as noted.  Physical Exam:  Vital signs in  last 24 hours: There were no vitals taken for this visit. Constitutional:  Alert and oriented, No acute distress Cardiovascular: Regular rate  Respiratory: Normal respiratory effort Genitourinary: Normal anal sphincter tone.  Gland flat, smooth, no nodules. Lymphatic: No lymphadenopathy Neurologic: Grossly intact, no focal deficits Psychiatric: Normal mood and affect  I have reviewed prior pt notes  I have reviewed notes from referring/previous physicians  I have reviewed urinalysis results  I have reviewed prior PSA results  Impression/Assessment:  1.  History of prostate cancer, stage T1c, pretreatment PSA 5.3.  He underwent brachytherapy in January, 2022.  Thus far he has had an excellent PSA response with minimal urinary symptoms on Flomax  2.  BPH with lower urinary tract symptoms, doing well with Flomax  Plan:  I will see back in 6 months following PSA

## 2021-07-14 ENCOUNTER — Ambulatory Visit (INDEPENDENT_AMBULATORY_CARE_PROVIDER_SITE_OTHER): Payer: PPO | Admitting: Urology

## 2021-07-14 ENCOUNTER — Other Ambulatory Visit: Payer: Self-pay

## 2021-07-14 ENCOUNTER — Other Ambulatory Visit: Payer: Self-pay | Admitting: Family Medicine

## 2021-07-14 ENCOUNTER — Encounter: Payer: Self-pay | Admitting: Urology

## 2021-07-14 VITALS — BP 171/82 | HR 69 | Ht 72.0 in | Wt 220.0 lb

## 2021-07-14 DIAGNOSIS — C61 Malignant neoplasm of prostate: Secondary | ICD-10-CM | POA: Diagnosis not present

## 2021-07-14 DIAGNOSIS — F411 Generalized anxiety disorder: Secondary | ICD-10-CM

## 2021-07-14 DIAGNOSIS — M1000 Idiopathic gout, unspecified site: Secondary | ICD-10-CM

## 2021-07-14 NOTE — Progress Notes (Signed)
Urological Symptom Review  Patient is experiencing the following symptoms: No symptoms   Review of Systems  Gastrointestinal (upper)  : Indigestion/heartburn  Gastrointestinal (lower) : Negative for lower GI symptoms  Constitutional : Night Sweats  Skin: Skin rash/lesion  Eyes: Negative for eye symptoms  Ear/Nose/Throat : Negative for Ear/Nose/Throat symptoms  Hematologic/Lymphatic: Negative for Hematologic/Lymphatic symptoms  Cardiovascular : Leg swelling  Respiratory : Negative for respiratory symptoms  Endocrine: Negative for endocrine symptoms  Musculoskeletal: Negative for musculoskeletal symptoms  Neurological: Negative for neurological symptoms  Psychologic: Negative for psychiatric symptoms

## 2021-07-14 NOTE — Telephone Encounter (Signed)
Ok to refill??  Last office visit 04/23/2021.  Last refill 06/15/2021 on both.

## 2021-07-17 LAB — MICROSCOPIC EXAMINATION
Bacteria, UA: NONE SEEN
Epithelial Cells (non renal): NONE SEEN /hpf (ref 0–10)
Renal Epithel, UA: NONE SEEN /hpf
WBC, UA: NONE SEEN /hpf (ref 0–5)

## 2021-07-17 LAB — URINALYSIS, ROUTINE W REFLEX MICROSCOPIC
Bilirubin, UA: NEGATIVE
Glucose, UA: NEGATIVE
Ketones, UA: NEGATIVE
Leukocytes,UA: NEGATIVE
Nitrite, UA: NEGATIVE
Specific Gravity, UA: 1.02 (ref 1.005–1.030)
Urobilinogen, Ur: 1 mg/dL (ref 0.2–1.0)
pH, UA: 6.5 (ref 5.0–7.5)

## 2021-07-22 ENCOUNTER — Other Ambulatory Visit: Payer: Self-pay | Admitting: Family Medicine

## 2021-07-24 ENCOUNTER — Ambulatory Visit (INDEPENDENT_AMBULATORY_CARE_PROVIDER_SITE_OTHER): Payer: PPO

## 2021-07-24 ENCOUNTER — Other Ambulatory Visit: Payer: Self-pay

## 2021-07-24 VITALS — Ht 72.0 in | Wt 220.0 lb

## 2021-07-24 DIAGNOSIS — Z0000 Encounter for general adult medical examination without abnormal findings: Secondary | ICD-10-CM

## 2021-07-24 NOTE — Patient Instructions (Signed)
[name] , Thank you for taking time to come for your Medicare Wellness Visit. I appreciate your ongoing commitment to your health goals. Please review the following plan we discussed and let me know if I can assist you in the future.   Screening recommendations/referrals: Colonoscopy: Done 10/28/2020 Recommended yearly ophthalmology/optometry visit for glaucoma screening and checkup Recommended yearly dental visit for hygiene and checkup  Vaccinations: Influenza vaccine: Done 09/19/2020 Repeat annually  Pneumococcal vaccine: Done 06/25/2009 Tdap vaccine: Due Repeat in 10 years  Shingles vaccine: Shingrix discussed. Please contact your pharmacy for coverage information.     Covid-19: Done 01/05/20, 02/05/20, 08/22/20  Advanced directives: Advance directive discussed with you today. Even though you declined this today, please call our office should you change your mind, and we can give you the proper paperwork for you to fill out.   Conditions/risks identified: Aim for 30 minutes of exercise or walking each day, drink 6-8 glasses of water and eat lots of fruits and vegetables.   Next appointment: Follow up in one year for your annual wellness visit 2023.  Preventive Care 40-64 Years, Male Preventive care refers to lifestyle choices and visits with your health care provider that can promote health and wellness. What does preventive care include? A yearly physical exam. This is also called an annual well check. Dental exams once or twice a year. Routine eye exams. Ask your health care provider how often you should have your eyes checked. Personal lifestyle choices, including: Daily care of your teeth and gums. Regular physical activity. Eating a healthy diet. Avoiding tobacco and drug use. Limiting alcohol use. Practicing safe sex. Taking low-dose aspirin every day starting at age 35. What happens during an annual well check? The services and screenings done by your health care  provider during your annual well check will depend on your age, overall health, lifestyle risk factors, and family history of disease. Counseling  Your health care provider may ask you questions about your: Alcohol use. Tobacco use. Drug use. Emotional well-being. Home and relationship well-being. Sexual activity. Eating habits. Work and work Statistician. Screening  You may have the following tests or measurements: Height, weight, and BMI. Blood pressure. Lipid and cholesterol levels. These may be checked every 5 years, or more frequently if you are over 6 years old. Skin check. Lung cancer screening. You may have this screening every year starting at age 73 if you have a 30-pack-year history of smoking and currently smoke or have quit within the past 15 years. Fecal occult blood test (FOBT) of the stool. You may have this test every year starting at age 50. Flexible sigmoidoscopy or colonoscopy. You may have a sigmoidoscopy every 5 years or a colonoscopy every 10 years starting at age 3. Prostate cancer screening. Recommendations will vary depending on your family history and other risks. Hepatitis C blood test. Hepatitis B blood test. Sexually transmitted disease (STD) testing. Diabetes screening. This is done by checking your blood sugar (glucose) after you have not eaten for a while (fasting). You may have this done every 1-3 years. Discuss your test results, treatment options, and if necessary, the need for more tests with your health care provider. Vaccines  Your health care provider may recommend certain vaccines, such as: Influenza vaccine. This is recommended every year. Tetanus, diphtheria, and acellular pertussis (Tdap, Td) vaccine. You may need a Td booster every 10 years. Zoster vaccine. You may need this after age 62. Pneumococcal 13-valent conjugate (PCV13) vaccine. You may need this  if you have certain conditions and have not been vaccinated. Pneumococcal  polysaccharide (PPSV23) vaccine. You may need one or two doses if you smoke cigarettes or if you have certain conditions. Talk to your health care provider about which screenings and vaccines you need and how often you need them. This information is not intended to replace advice given to you by your health care provider. Make sure you discuss any questions you have with your health care provider. Document Released: 11/07/2015 Document Revised: 06/30/2016 Document Reviewed: 08/12/2015 Elsevier Interactive Patient Education  2017 [employer] Prevention in the Home Falls can cause injuries. They can happen to people of all ages. There are many things you can do to make your home safe and to help prevent falls. What can I do on the outside of my home? Regularly fix the edges of walkways and driveways and fix any cracks. Remove anything that might make you trip as you walk through a door, such as a raised step or threshold. Trim any bushes or trees on the path to your home. Use bright outdoor lighting. Clear any walking paths of anything that might make someone trip, such as rocks or tools. Regularly check to see if handrails are loose or broken. Make sure that both sides of any steps have handrails. Any raised decks and porches should have guardrails on the edges. Have any leaves, snow, or ice cleared regularly. Use sand or salt on walking paths during winter. Clean up any spills in your garage right away. This includes oil or grease spills. What can I do in the bathroom? Use night lights. Install grab bars by the toilet and in the tub and shower. Do not use towel bars as grab bars. Use non-skid mats or decals in the tub or shower. If you need to sit down in the shower, use a plastic, non-slip stool. Keep the floor dry. Clean up any water that spills on the floor as soon as it happens. Remove soap buildup in the tub or shower regularly. Attach bath mats securely with double-sided  non-slip rug tape. Do not have throw rugs and other things on the floor that can make you trip. What can I do in the bedroom? Use night lights. Make sure that you have a light by your bed that is easy to reach. Do not use any sheets or blankets that are too big for your bed. They should not hang down onto the floor. Have a firm chair that has side arms. You can use this for support while you get dressed. Do not have throw rugs and other things on the floor that can make you trip. What can I do in the kitchen? Clean up any spills right away. Avoid walking on wet floors. Keep items that you use a lot in easy-to-reach places. If you need to reach something above you, use a strong step stool that has a grab bar. Keep electrical cords out of the way. Do not use floor polish or wax that makes floors slippery. If you must use wax, use non-skid floor wax. Do not have throw rugs and other things on the floor that can make you trip. What can I do with my stairs? Do not leave any items on the stairs. Make sure that there are handrails on both sides of the stairs and use them. Fix handrails that are broken or loose. Make sure that handrails are as long as the stairways. Check any carpeting to make sure that it  is firmly attached to the stairs. Fix any carpet that is loose or worn. Avoid having throw rugs at the top or bottom of the stairs. If you do have throw rugs, attach them to the floor with carpet tape. Make sure that you have a light switch at the top of the stairs and the bottom of the stairs. If you do not have them, ask someone to add them for you. What else can I do to help prevent falls? Wear shoes that: Do not have high heels. Have rubber bottoms. Are comfortable and fit you well. Are closed at the toe. Do not wear sandals. If you use a stepladder: Make sure that it is fully opened. Do not climb a closed stepladder. Make sure that both sides of the stepladder are locked into place. Ask  someone to hold it for you, if possible. Clearly mark and make sure that you can see: Any grab bars or handrails. First and last steps. Where the edge of each step is. Use tools that help you move around (mobility aids) if they are needed. These include: Canes. Walkers. Scooters. Crutches. Turn on the lights when you go into a dark area. Replace any light bulbs as soon as they burn out. Set up your furniture so you have a clear path. Avoid moving your furniture around. If any of your floors are uneven, fix them. If there are any pets around you, be aware of where they are. Review your medicines with your doctor. Some medicines can make you feel dizzy. This can increase your chance of falling. Ask your doctor what other things that you can do to help prevent falls. This information is not intended to replace advice given to you by your health care provider. Make sure you discuss any questions you have with your health care provider. Document Released: 08/07/2009 Document Revised: 03/18/2016 Document Reviewed: 11/15/2014 Elsevier Interactive Patient Education  2017 [employer].

## 2021-07-24 NOTE — Progress Notes (Addendum)
Subjective:   Brandon Buck is a 60 y.o. male who presents for an Initial Medicare Annual Wellness Visit. Virtual Visit via Telephone Note  I connected with  Despina Arias on 07/24/21 at 11:15 AM EDT by telephone and verified that I am speaking with the correct person using two identifiers.  Location: Patient: HOME Provider: [facility] Persons participating in the virtual visit: patient/Nurse Health Advisor   I discussed the limitations, risks, security and privacy concerns of performing an evaluation and management service by telephone and the availability of in person appointments. The patient expressed understanding and agreed to proceed.  Interactive audio and video telecommunications were attempted between this nurse and patient, however failed, due to patient having technical difficulties OR patient did not have access to video capability.  We continued and completed visit with audio only.  Some vital signs may be absent or patient reported.   Chriss Driver, LPN  Review of Systems     Cardiac Risk Factors include: advanced age (>27men, >108 women);hypertension;sedentary lifestyle;male gender;dyslipidemia     Objective:    Today's Vitals   07/24/21 1116  Weight: 220 lb (99.8 kg)  Height: 6' (1.829 m)   Body mass index is 29.84 kg/m.  Advanced Directives 07/24/2021 11/20/2020 11/03/2020 10/28/2020 09/02/2020 06/19/2020 03/14/2020  Does Patient Have a Medical Advance Directive? No No No No No No No  Would patient like information on creating a medical advance directive? No - Patient declined No - Patient declined No - Patient declined No - Patient declined No - Patient declined - -    Current Medications (verified) Outpatient Encounter Medications as of 07/24/2021  Medication Sig   allopurinol (ZYLOPRIM) 100 MG tablet TAKE 2 TABLETS BY MOUTH DAILY.   ALPRAZolam (XANAX) 0.5 MG tablet TAKE (1) TABLET BY MOUTH TWICE DAILY AS NEEDED FOR ANXIETY   aspirin 81 MG tablet Take 81  mg by mouth daily.   atorvastatin (LIPITOR) 80 MG tablet TAKE (1) TABLET BY MOUTH ONCE DAILY.   ezetimibe (ZETIA) 10 MG tablet TAKE ONE TABLET BY MOUTH ONCE DAILY.   fluticasone (FLONASE) 50 MCG/ACT nasal spray Place 2 sprays into both nostrils daily.   furosemide (LASIX) 20 MG tablet Take 1 tablet (20 mg total) by mouth daily.   HYDROcodone-acetaminophen (NORCO/VICODIN) 5-325 MG tablet TAKE 1 TABLET BY MOUTH THREE TIMES DAILY AS NEEDED FOR MODERATE PAIN.   lisinopril (ZESTRIL) 20 MG tablet Take 20 mg by mouth daily.   NICOTROL 10 MG inhaler INHALE 1 CARTRIDGE INTO THE LUNGS AS NEEDED FOR SMOKING CESSATION.   nitroGLYCERIN (NITROSTAT) 0.4 MG SL tablet Place 1 tablet (0.4 mg total) under the tongue every 5 (five) minutes as needed.   pantoprazole (PROTONIX) 40 MG tablet Take 1 tablet (40 mg total) by mouth 2 (two) times daily.   Phenazopyridine HCl (URISTAT PO) Take by mouth.   tamsulosin (FLOMAX) 0.4 MG CAPS capsule Take 1 capsule (0.4 mg total) by mouth daily after supper.   triamcinolone cream (KENALOG) 0.1 % Apply 1 application topically 2 (two) times daily.   metoprolol tartrate (LOPRESSOR) 50 MG tablet Take 1 tablet (50 mg total) by mouth 2 (two) times daily.   No facility-administered encounter medications on file as of 07/24/2021.    Allergies (verified) Other and Keflex [cephalexin]   History: Past Medical History:  Diagnosis Date   Anxiety    Arthritis    Asthma    Cancer ([place])    right renal cancer, right radical nephrectomy 2004  Chronic back pain    Chronic hip pain, left    CKD (chronic kidney disease) stage 3, GFR 30-59 ml/min (HCC)    ckd stage 3 b    Complication of anesthesia    1 seizure after anesthesia 1999 or 2000 none since , recent surgeries no seizures   Coronary artery disease    a. s/p prior PCI to RCA and LCx in 2009   Depression    GERD (gastroesophageal reflux disease)    Gout    no recent flares   Heart disease    High blood pressure    History  of PTCA 2 2004   2 stents   History of stomach ulcers    Hypercholesteremia    Hypertension    Kidney disease    Myocardial infarction [facility]) 2004   Prostate cancer ([place])    3/12 cores upt to 40% grade 2 cancer. Planning brachytherapy (2021)   Proteinuria    Renal insufficiency    Tennis elbow    Past Surgical History:  Procedure Laterality Date   COLONOSCOPY WITH PROPOFOL N/A 10/28/2020   Procedure: COLONOSCOPY WITH PROPOFOL;  Surgeon: Harvel Quale, MD;  [facility];  Service: Gastroenterology;  Laterality: N/A;  2:30   coloscopy  10/28/2020   removed 7 polyps done at [place] N/A 11/03/2020   Procedure: CYSTOSCOPY;  Surgeon: Franchot Gallo, MD;  Location[facility];  Service: Urology;  Laterality: N/A;   Heart catherization  2004   2 stents to heart cypher left circulflex   HERNIA REPAIR  1999   left groin bilateral   HIP SURGERY Left 1999 or 2000   bond graft   Kidney removed Right 2003 or 2004   for cancer   POLYPECTOMY  10/28/2020   Procedure: POLYPECTOMY INTESTINAL;  Surgeon: Harvel Quale, MD;  [facility]: [facility];  Service: Gastroenterology;;   PROSTATE BIOPSY  2021   RADIOACTIVE SEED IMPLANT N/A 11/03/2020   Procedure: RADIOACTIVE SEED IMPLANT/BRACHYTHERAPY IMPLANT;  Surgeon: Franchot Gallo, MD;  [facility];  Service: Urology;  Laterality: N/A;  [place] N/A 11/03/2020   Procedure: SPACE OAR INSTILLATION;  Surgeon: Franchot Gallo, MD;  [facility];  Service: Urology;  Laterality: N/A;   Family History  Problem Relation Age of Onset   Heart disease Other    Cancer Other    Kidney disease Other    Heart disease Father    Prostate cancer Father    Heart disease Mother    Lung cancer Mother    Kidney cancer Mother    Prostate cancer Maternal Uncle    Bladder Cancer Maternal Grandfather    Breast cancer Neg Hx     Colon cancer Neg Hx    Pancreatic cancer Neg Hx    Social History   Socioeconomic History   Marital status: Married    Spouse name: Sheree   Number of children: 0   Years of education: Not on file   Highest education level: Not on file  Occupational History   Occupation: retired  Tobacco Use   Smoking status: Former    Packs/day: 1.50    Years: 32.00    Pack years: 48.00    Types: Cigarettes    Start date: 01/26/1971    Quit date: 01/26/2003    Years since quitting: 18.5   Smokeless tobacco: Never  Vaping Use   Vaping Use: Never  used  Substance and Sexual Activity   Alcohol use: Yes    Alcohol/week: 0.0 standard drinks    Comment: occ 2 week   Drug use: No   Sexual activity: Yes  Other Topics Concern   Not on file  Social History Narrative   Worked as an Clinical biochemist.    Married x 45 years in 2022.   Social Determinants of Health   Financial Resource Strain: Low Risk    Difficulty of Paying Living Expenses: Not hard at all  Food Insecurity: No Food Insecurity   Worried About Charity fundraiser in the Last Year: Never true   [place] in the Last Year: Never true  Transportation Needs: No Transportation Needs   Lack of Transportation (Medical): No   Lack of Transportation (Non-Medical): No  Physical Activity: Sufficiently Active   Days of Exercise per Week: 5 days   Minutes of Exercise per Session: 30 min  Stress: No Stress Concern Present   Feeling of Stress : Not at all  Social Connections: Socially Integrated   Frequency of Communication with Friends and Family: More than three times a week   Frequency of Social Gatherings with Friends and Family: Three times a week   Attends Religious Services: 1 to 4 times per year   Active Member of Clubs or Organizations: No   Attends Music therapist: 1 to 4 times per year   Marital Status: Married    Tobacco Counseling Counseling given: Not Answered   Clinical Intake:  Pre-visit preparation  completed: Yes  Pain : No/denies pain     BMI - recorded: 29.84 Nutritional Status: BMI 25 -29 Overweight Nutritional Risks: None Diabetes: No  How often do you need to have someone help you when you read instructions, pamphlets, or other written materials from your doctor or pharmacy?: 1 - Never  Diabetic?NP  Interpreter Needed?: No  Information entered by :: MJ Josefine Fuhr, LPN   Activities of Daily Living In your present state of health, do you have any difficulty performing the following activities: 07/24/2021 11/03/2020  Hearing? N N  Vision? N N  Difficulty concentrating or making decisions? N N  Walking or climbing stairs? N N  Dressing or bathing? N N  Doing errands, shopping? N -  Preparing Food and eating ? N -  Using the Toilet? N -  In the past six months, have you accidently leaked urine? N -  Do you have problems with loss of bowel control? N -  Managing your Medications? N -  Managing your Finances? N -  Housekeeping or managing your Housekeeping? N -  Some recent data might be hidden    Patient Care Team: [name], MD as PCP - General (Family Medicine) Edythe Clarity, [facility] as Pharmacist (Pharmacist)  Indicate any recent Medical Services you may have received from other than [facility] providers in the past year (date may be approximate).     Assessment:   This is a routine wellness examination for [facility].  Hearing/Vision screen Hearing Screening - Comments:: No hearing issues.  Vision Screening - Comments:: Readers. Dr. Katy Fitch. Overdue.   Dietary issues and exercise activities discussed: Current Exercise Habits: Home exercise routine, Type of exercise: walking, Time (Minutes): 30, Frequency (Times/Week): 5, Weekly Exercise (Minutes/Week): 150, Intensity: Mild, Exercise limited by: cardiac condition(s)   Goals Addressed             This Visit's Progress    Exercise 3x per week (  30 min per time)         Depression Screen PHQ 2/9 Scores  07/24/2021 03/27/2021 01/31/2020 01/23/2018  PHQ - 2 Score 0 0 0 0    Fall Risk Fall Risk  07/24/2021 03/27/2021  Falls in the past year? 0 0  Number falls in past yr: 0 0  Injury with Fall? 0 0  Risk for fall due to : Impaired vision No Fall Risks  Follow up Falls prevention discussed Falls evaluation completed    FALL RISK PREVENTION PERTAINING TO THE HOME:  Any stairs in or around the home? Yes  If so, are there any without handrails? No  Home free of loose throw rugs in walkways, pet beds, electrical cords, etc? Yes  Adequate lighting in your home to reduce risk of falls? Yes   ASSISTIVE DEVICES UTILIZED TO PREVENT FALLS:  Life alert? No  Use of a cane, walker or w/c? Yes  Grab bars in the bathroom? Yes  Shower chair or bench in shower? Yes  Elevated toilet seat or a handicapped toilet? Yes   TIMED UP AND GO:  Was the test performed? No . Phone visit.   Cognitive Function:     6CIT Screen 07/24/2021  What Year? 0 points  What month? 0 points  What time? 0 points  Count back from 20 0 points  Months in reverse 0 points  Repeat phrase 0 points  Total Score 0    Immunizations Immunization History  Administered Date(s) Administered   Influenza Inj Mdck Quad With Preservative 07/25/2018   Influenza,inj,Quad PF,6+ Mos 08/06/2014   Influenza,inj,quad, With Preservative 07/06/2019   Influenza-Unspecified 08/01/2015, 07/06/2019, 09/19/2020   Moderna Sars-Covid-2 Vaccination 01/05/2020, 02/05/2020, 08/22/2020   Pneumococcal Polysaccharide-23 06/25/2009, 01/23/2018    TDAP status: Due, Education has been provided regarding the importance of this vaccine. Advised may receive this vaccine at local pharmacy or Health Dept. Aware to provide a copy of the vaccination record if obtained from local pharmacy or Health Dept. Verbalized acceptance and understanding.  Flu Vaccine status: Due, Education has been provided regarding the importance of this vaccine. Advised may receive this  vaccine at local pharmacy or Health Dept. Aware to provide a copy of the vaccination record if obtained from local pharmacy or Health Dept. Verbalized acceptance and understanding.  Pneumococcal vaccine status: Due, Education has been provided regarding the importance of this vaccine. Advised may receive this vaccine at local pharmacy or Health Dept. Aware to provide a copy of the vaccination record if obtained from local pharmacy or Health Dept. Verbalized acceptance and understanding.  Covid-19 vaccine status: Information provided on how to obtain vaccines.   Qualifies for Shingles Vaccine? Yes   Zostavax completed No   Shingrix Completed?: No.    Education has been provided regarding the importance of this vaccine. Patient has been advised to call insurance company to determine out of pocket expense if they have not yet received this vaccine. Advised may also receive vaccine at local pharmacy or Health Dept. Verbalized acceptance and understanding.  Screening Tests Health Maintenance  Topic Date Due   TETANUS/TDAP  Never done   Zoster Vaccines- Shingrix (1 of 2) Never done   COVID-19 Vaccine (4 - Booster for Moderna series) 11/14/2020   INFLUENZA VACCINE  05/25/2021   Fecal DNA (Cologuard)  02/11/2023   Hepatitis C Screening  Completed   HPV VACCINES  Aged Out   HIV Screening  Discontinued    Health Maintenance  Health Maintenance Due  Topic Date  Due   TETANUS/TDAP  Never done   Zoster Vaccines- Shingrix (1 of 2) Never done   COVID-19 Vaccine (4 - Booster for Moderna series) 11/14/2020   INFLUENZA VACCINE  05/25/2021    Colorectal cancer screening: Type of screening: Colonoscopy. Completed 10/28/2020. Repeat every 5 years  Lung Cancer Screening: (Low Dose CT Chest recommended if Age 66-80 years, 30 pack-year currently smoking OR have quit w/in 15years.) does not qualify.     Additional Screening:  Hepatitis C Screening: does qualify; Completed 04/26/2016  Vision Screening:  Recommended annual ophthalmology exams for early detection of glaucoma and other disorders of the eye. Is the patient up to date with their annual eye exam?  No  Who is the provider or what is the name of the office in which the patient attends annual eye exams? Dr. Katy Fitch If pt is not established with a provider, would they like to be referred to a provider to establish care? No .   Dental Screening: Recommended annual dental exams for proper oral hygiene  Community Resource Referral / Chronic Care Management: CRR required this visit?  No   CCM required this visit?  No      Plan:     I have personally reviewed and noted the following in the patient's chart:   Medical and social history Use of alcohol, tobacco or illicit drugs  Current medications and supplements including opioid prescriptions. Patient is currently taking opioid prescriptions. Information provided to patient regarding non-opioid alternatives. Patient advised to discuss non-opioid treatment plan with their provider. Functional ability and status Nutritional status Physical activity Advanced directives List of other physicians Hospitalizations, surgeries, and ER visits in previous 12 months Vitals Screenings to include cognitive, depression, and falls Referrals and appointments  In addition, I have reviewed and discussed with patient certain preventive protocols, quality metrics, and best practice recommendations. A written personalized care plan for preventive services as well as general preventive health recommendations were provided to patient.     Chriss Driver, LPN   [id number]   Nurse Notes: Needs flu, shingles, 2nd pneumococcal and tdap vaccines. Discussed how to obtain. Pt states his Lisinopril dose was changed by his Nephrologist from 10 mg to 20 mg 2 weeks ago. Encouraged pt to monitor BP daily. Pt verbalized understanding.      I have collaborated with the care management provider regarding care  management and care coordination activities outlined in this encounter and have reviewed this encounter including documentation in the note and care plan. I am certifying that I agree with the content of this note and encounter as supervising physician.

## 2021-08-13 ENCOUNTER — Other Ambulatory Visit: Payer: Self-pay | Admitting: Family Medicine

## 2021-08-13 DIAGNOSIS — F411 Generalized anxiety disorder: Secondary | ICD-10-CM

## 2021-08-13 NOTE — Telephone Encounter (Signed)
Ok to refill??  Last office visit 03/27/2021.  Last refill 07/16/2021 on both.

## 2021-08-14 ENCOUNTER — Telehealth: Payer: Self-pay | Admitting: Pharmacist

## 2021-08-14 NOTE — Progress Notes (Signed)
Chronic Care Management Pharmacy Assistant   Name: Brandon Buck  MRN: [id number] DOB: 02-23-1961  Reason for Encounter: Disease State For HTN.   Conditions to be addressed/monitored: CKD, Gout, HTN, HLD  Recent office visits:  07/24/21 Chriss Driver, LPN. For medicare wellness. No medication changes.   Recent consult visits:  07/14/21 Urology Franchot Gallo, MD. For malignant neoplasm of prostate. COMPLETED Prednisone.  06/26/21 Nephrology Bhutani, Lavina Hamman, MBBS  For CKD return patient. Per note: lisinopril dose from 10 mg to 20 mg   Hospital visits:  None since   Medications: Outpatient Encounter Medications as of 08/14/2021  Medication Sig Note   allopurinol (ZYLOPRIM) 100 MG tablet TAKE 2 TABLETS BY MOUTH DAILY.    ALPRAZolam (XANAX) 0.5 MG tablet TAKE (1) TABLET BY MOUTH TWICE DAILY AS NEEDED FOR ANXIETY    aspirin 81 MG tablet Take 81 mg by mouth daily.    atorvastatin (LIPITOR) 80 MG tablet TAKE (1) TABLET BY MOUTH ONCE DAILY.    ezetimibe (ZETIA) 10 MG tablet TAKE ONE TABLET BY MOUTH ONCE DAILY.    fluticasone (FLONASE) 50 MCG/ACT nasal spray Place 2 sprays into both nostrils daily. 10/02/2020: Per patient as needed.   furosemide (LASIX) 20 MG tablet Take 1 tablet (20 mg total) by mouth daily.    HYDROcodone-acetaminophen (NORCO/VICODIN) 5-325 MG tablet TAKE 1 TABLET BY MOUTH THREE TIMES DAILY AS NEEDED FOR MODERATE PAIN.    lisinopril (ZESTRIL) 20 MG tablet Take 20 mg by mouth daily.    metoprolol tartrate (LOPRESSOR) 50 MG tablet Take 1 tablet (50 mg total) by mouth 2 (two) times daily.    NICOTROL 10 MG inhaler INHALE 1 CARTRIDGE INTO THE LUNGS AS NEEDED FOR SMOKING CESSATION.    nitroGLYCERIN (NITROSTAT) 0.4 MG SL tablet Place 1 tablet (0.4 mg total) under the tongue every 5 (five) minutes as needed.    pantoprazole (PROTONIX) 40 MG tablet Take 1 tablet (40 mg total) by mouth 2 (two) times daily.    Phenazopyridine HCl (URISTAT PO) Take by mouth.     tamsulosin (FLOMAX) 0.4 MG CAPS capsule Take 1 capsule (0.4 mg total) by mouth daily after supper.    triamcinolone cream (KENALOG) 0.1 % Apply 1 application topically 2 (two) times daily.    No facility-administered encounter medications on file as of 08/14/2021.   Reviewed chart prior to disease state call. Spoke with patient regarding BP  Recent Office Vitals: BP Readings from Last 3 Encounters:  07/14/21 (!) 171/82  05/11/21 (!) 185/94  03/27/21 138/78   Pulse Readings from Last 3 Encounters:  07/14/21 69  05/11/21 86  03/27/21 84    Wt Readings from Last 3 Encounters:  07/24/21 220 lb (99.8 kg)  07/14/21 220 lb (99.8 kg)  03/27/21 223 lb (101.2 kg)     Kidney Function Lab Results  Component Value Date/Time   CREATININE 1.46 (H) 03/27/2021 12:37 PM   CREATININE 1.51 (H) 10/29/2020 10:21 AM   CREATININE 1.49 (H) 10/27/2020 10:23 AM   CREATININE 1.76 (H) 08/29/2020 11:27 AM   GFRNONAA 51 (L) 03/27/2021 12:37 PM   GFRAA 59 (L) 03/27/2021 12:37 PM    BMP Latest Ref Rng & Units 03/27/2021 10/29/2020 10/27/2020  Glucose 65 - 99 mg/dL 87 79 90  BUN 7 - 25 mg/dL 11 15 14   Creatinine 0.70 - 1.25 mg/dL 1.46(H) 1.51(H) 1.49(H)  BUN/Creat Ratio 6 - 22 (calc) 8 - -  Sodium 135 - 146 mmol/L 138 140 136  Potassium 3.5 - 5.3 mmol/L 4.6 4.1 4.1  Chloride 98 - 110 mmol/L 103 107 104  CO2 20 - 32 mmol/L 28 23 22   Calcium 8.6 - 10.3 mg/dL 9.7 9.8 9.2    Current antihypertensive regimen:  Metoprolol 50 mg 1 tablet by mouth 2 times daily. Lisinopril 20 mg Take 20 mg by mouth daily  How often are you checking your Blood Pressure? Patient stated several times per month.  Current home BP readings: Patient stated the most recent blood pressure was 137/80  What recent interventions/DTPs have been made by any provider to improve Blood Pressure control since last CPP Visit: None.  Any recent hospitalizations or ED visits since last visit with CPP? Patient stated no.  What diet changes  have been made to improve Blood Pressure Control?  Patient stated he eats a well rounded diet and sometimes he eats out. He stated he cut out on his caffeine use.   What exercise is being done to improve your Blood Pressure Control?  Patient stated he stays outside a lot but since its been cold he hasn't been doing that much activity.   Adherence Review: Is the patient currently on ACE/ARB medication? Lisinopril 20 mg  Does the patient have >5 day gap between last estimated fill dates? Per Misc Rpts, No.  Care Gaps:None.  Star Rating Drugs:Atorvastatin 80 mg 04/28/21 90 DS,  Lisinopril 20 mg 07/02/21 80 DS.  Follow-Up:Pharmacist Review  Charlann Lange, [place] Pharmacist Assistant [phone]

## 2021-09-09 ENCOUNTER — Other Ambulatory Visit: Payer: Self-pay | Admitting: Family Medicine

## 2021-09-09 DIAGNOSIS — F411 Generalized anxiety disorder: Secondary | ICD-10-CM

## 2021-09-09 NOTE — Telephone Encounter (Signed)
LOV  03/27/21 Last refill Alprazolam  08/14/21, #30, 0 refills  Last refill Hydrocodone 08/14/21, #90, 0 refills  Please review, thanks!

## 2021-09-14 ENCOUNTER — Other Ambulatory Visit: Payer: Self-pay | Admitting: Family Medicine

## 2021-09-14 DIAGNOSIS — M1000 Idiopathic gout, unspecified site: Secondary | ICD-10-CM

## 2021-09-28 ENCOUNTER — Other Ambulatory Visit: Payer: Self-pay | Admitting: Family Medicine

## 2021-09-29 ENCOUNTER — Telehealth: Payer: Self-pay | Admitting: Pharmacist

## 2021-09-29 NOTE — Progress Notes (Addendum)
Chronic Care Management Pharmacy Assistant   Name: Brandon Buck  MRN: [id number] DOB: 1961-03-10   Reason for Encounter: Disease State - Hypertension Call     Recent office visits:  None noted.   Recent consult visits:  None noted.  Hospital visits: 05/11/21 Medication Reconciliation was completed by comparing discharge summary, patient's EMR and Pharmacy list, and upon discussion with patient.  Admitted to the hospital on 05/11/21 due to Gout. Discharge date was 05/11/21. Discharged from [facility].    New?Medications Started at [facility] Discharge:?? predniSONE (DELTASONE) 20 MG tablet and triamcinolone cream (KENALOG) 0.1 % prescribed.   Medication Changes at Hospital Discharge: No changes noted.  Medications Discontinued at Hospital Discharge: No medications were discontinued at discharge.  Medications that remain the same after Hospital Discharge:??  All other medications will remain the same.    Medications: Outpatient Encounter Medications as of 09/29/2021  Medication Sig Note   allopurinol (ZYLOPRIM) 100 MG tablet TAKE 2 TABLETS BY MOUTH DAILY.    ALPRAZolam (XANAX) 0.5 MG tablet TAKE (1) TABLET BY MOUTH TWICE DAILY AS NEEDED FOR ANXIETY    aspirin 81 MG tablet Take 81 mg by mouth daily.    atorvastatin (LIPITOR) 80 MG tablet TAKE (1) TABLET BY MOUTH ONCE DAILY.    ezetimibe (ZETIA) 10 MG tablet TAKE ONE TABLET BY MOUTH ONCE DAILY.    fluticasone (FLONASE) 50 MCG/ACT nasal spray Place 2 sprays into both nostrils daily. 10/02/2020: Per patient as needed.   furosemide (LASIX) 20 MG tablet Take 1 tablet (20 mg total) by mouth daily.    HYDROcodone-acetaminophen (NORCO/VICODIN) 5-325 MG tablet TAKE 1 TABLET BY MOUTH THREE TIMES DAILY AS NEEDED FOR MODERATE PAIN.    lisinopril (ZESTRIL) 20 MG tablet Take 20 mg by mouth daily.    metoprolol tartrate (LOPRESSOR) 50 MG tablet Take 1 tablet (50 mg total) by mouth 2 (two) times daily.    NICOTROL 10 MG inhaler  INHALE 1 CARTRIDGE INTO THE LUNGS AS NEEDED FOR SMOKING CESSATION.    nitroGLYCERIN (NITROSTAT) 0.4 MG SL tablet Place 1 tablet (0.4 mg total) under the tongue every 5 (five) minutes as needed.    pantoprazole (PROTONIX) 40 MG tablet Take 1 tablet (40 mg total) by mouth 2 (two) times daily.    Phenazopyridine HCl (URISTAT PO) Take by mouth.    tamsulosin (FLOMAX) 0.4 MG CAPS capsule Take 1 capsule (0.4 mg total) by mouth daily after supper.    triamcinolone cream (KENALOG) 0.1 % Apply 1 application topically 2 (two) times daily.    No facility-administered encounter medications on file as of 09/29/2021.   Current antihypertensive regimen:  Metoprolol 50 mg 1 tablet by mouth 2 times daily. Lisinopril 20 mg Take 20 mg by mouth daily  How often are you checking your Blood Pressure?      Current home BP readings:    What recent interventions/DTPs have been made by any provider to improve Blood Pressure control since last CPP Visit:     Any recent hospitalizations or ED visits since last visit with CPP?  Patient was seen at [facility] for GOUT on 05/11/21   What diet changes have been made to improve Blood Pressure Control?     What exercise is being done to improve your Blood Pressure Control?      Adherence Review: Is the patient currently on ACE/ARB medication? Yes Does the patient have >5 day gap between last estimated fill dates? No  Metoprolol 50 mg 1  tablet by mouth 2 times daily. - last filled 04/28/21 90 days  Lisinopril 20 mg Take 20 mg by mouth daily - last filled 07/22/21 90 days    Care Gaps  AWV: done 07/24/21 Colonoscopy: done 10/28/2020 DM Eye Exam: N/A DM Foot Exam: N/A Microalbumin: N/A HbgAIC:  N/A DEXA: N/A Mammogram: N/A  Star Rating Drugs: atorvastatin (LIPITOR) 80 MG tablet - last filled 04/28/21 90 days  lisinopril (ZESTRIL) 20 MG tablet - last filled 07/22/21 90 days   Future Appointments  Date Time Provider [place]  01/12/2022 10:45 AM  Franchot Gallo, MD [facility] None  07/29/2022  2:45 PM BSFM-NURSE HEALTH ADVISOR [facility] None   Multiple attempts were made to contact patient. Attempts were unsuccessful. / ls,CMA   Jobe Gibbon, [place] Pharmacist Assistant  [phone]

## 2021-10-12 ENCOUNTER — Other Ambulatory Visit: Payer: Self-pay | Admitting: Family Medicine

## 2021-10-12 DIAGNOSIS — F411 Generalized anxiety disorder: Secondary | ICD-10-CM

## 2021-10-12 NOTE — Telephone Encounter (Signed)
LOV 03/27/21 Last refill  Alprazolam 09/10/21, #30, 0 refills Hydrocodone 09/10/21, #90, 0 refills  Please review, thanks!

## 2021-11-04 ENCOUNTER — Telehealth: Payer: Self-pay | Admitting: Pharmacist

## 2021-11-04 NOTE — Progress Notes (Signed)
Chronic Care Management Pharmacy Assistant   Name: Brandon Buck  MRN: [id number] DOB: 1961-01-10   Reason for Encounter: Disease State - General Adherence Call     Recent office visits:  None noted.   Recent consult visits:  None noted  Hospital visits: 05/11/21 Medication Reconciliation was completed by comparing discharge summary, patients EMR and Pharmacy list, and upon discussion with patient.   Admitted to the hospital on 05/11/21 due to Gout. Discharge date was 05/11/21. Discharged from [facility].     New?Medications Started at [facility] Discharge:?? predniSONE (DELTASONE) 20 MG tablet and triamcinolone cream (KENALOG) 0.1 % prescribed.    Medication Changes at Hospital Discharge: No changes noted.   Medications Discontinued at Hospital Discharge: No medications were discontinued at discharge.   Medications that remain the same after Hospital Discharge:??  All other medications will remain the same.    Medications: Outpatient Encounter Medications as of 11/04/2021  Medication Sig Note   allopurinol (ZYLOPRIM) 100 MG tablet TAKE 2 TABLETS BY MOUTH DAILY.    ALPRAZolam (XANAX) 0.5 MG tablet TAKE (1) TABLET BY MOUTH TWICE DAILY AS NEEDED FOR ANXIETY    aspirin 81 MG tablet Take 81 mg by mouth daily.    atorvastatin (LIPITOR) 80 MG tablet TAKE (1) TABLET BY MOUTH ONCE DAILY.    ezetimibe (ZETIA) 10 MG tablet TAKE ONE TABLET BY MOUTH ONCE DAILY.    fluticasone (FLONASE) 50 MCG/ACT nasal spray Place 2 sprays into both nostrils daily. 10/02/2020: Per patient as needed.   furosemide (LASIX) 20 MG tablet Take 1 tablet (20 mg total) by mouth daily.    HYDROcodone-acetaminophen (NORCO/VICODIN) 5-325 MG tablet TAKE 1 TABLET BY MOUTH THREE TIMES DAILY AS NEEDED FOR MODERATE PAIN.    lisinopril (ZESTRIL) 20 MG tablet Take 20 mg by mouth daily.    metoprolol tartrate (LOPRESSOR) 50 MG tablet Take 1 tablet (50 mg total) by mouth 2 (two) times daily.    NICOTROL 10 MG  inhaler INHALE 1 CARTRIDGE INTO THE LUNGS AS NEEDED FOR SMOKING CESSATION.    nitroGLYCERIN (NITROSTAT) 0.4 MG SL tablet Place 1 tablet (0.4 mg total) under the tongue every 5 (five) minutes as needed.    pantoprazole (PROTONIX) 40 MG tablet Take 1 tablet (40 mg total) by mouth 2 (two) times daily.    Phenazopyridine HCl (URISTAT PO) Take by mouth.    tamsulosin (FLOMAX) 0.4 MG CAPS capsule Take 1 capsule (0.4 mg total) by mouth daily after supper.    triamcinolone cream (KENALOG) 0.1 % Apply 1 application topically 2 (two) times daily.    No facility-administered encounter medications on file as of 11/04/2021.    Have you had any problems recently with your health?   Have you had any problems with your pharmacy?   What issues or side effects are you having with your medications?   What would you like me to pass along to Leata Mouse, CPP for them to help you with?    What can we do to take care of you better?   Future Appointments  Date Time Provider [place]  01/12/2022 10:45 AM Franchot Gallo, MD [facility] None  07/29/2022  2:45 PM BSFM-NURSE HEALTH ADVISOR [facility] None   Multiple attempts were made to contact patient. Attempts were unsuccessful. / [facility]: lisinopril (ZESTRIL) 20 MG tablet - last filled 07/22/21 90 days  atorvastatin (LIPITOR) 80 MG tablet - last filled 04/28/21 90 days    [employer],  [place] Clinical Pharmacist Assistant  [phone]

## 2021-11-09 ENCOUNTER — Other Ambulatory Visit: Payer: PPO

## 2021-11-09 ENCOUNTER — Other Ambulatory Visit: Payer: Self-pay

## 2021-11-09 DIAGNOSIS — N401 Enlarged prostate with lower urinary tract symptoms: Secondary | ICD-10-CM | POA: Diagnosis not present

## 2021-11-09 DIAGNOSIS — N138 Other obstructive and reflux uropathy: Secondary | ICD-10-CM

## 2021-11-09 LAB — URINALYSIS, ROUTINE W REFLEX MICROSCOPIC
Bilirubin, UA: NEGATIVE
Glucose, UA: NEGATIVE
Ketones, UA: NEGATIVE
Leukocytes,UA: NEGATIVE
Nitrite, UA: NEGATIVE
Specific Gravity, UA: 1.025 (ref 1.005–1.030)
Urobilinogen, Ur: 0.2 mg/dL (ref 0.2–1.0)
pH, UA: 6 (ref 5.0–7.5)

## 2021-11-09 LAB — MICROSCOPIC EXAMINATION
Epithelial Cells (non renal): NONE SEEN /hpf (ref 0–10)
Renal Epithel, UA: NONE SEEN /hpf
WBC, UA: NONE SEEN /hpf (ref 0–5)

## 2021-11-10 ENCOUNTER — Ambulatory Visit (INDEPENDENT_AMBULATORY_CARE_PROVIDER_SITE_OTHER): Payer: PPO | Admitting: Physician Assistant

## 2021-11-10 ENCOUNTER — Encounter: Payer: Self-pay | Admitting: Physician Assistant

## 2021-11-10 ENCOUNTER — Other Ambulatory Visit: Payer: Self-pay | Admitting: Family Medicine

## 2021-11-10 VITALS — BP 190/78 | HR 86 | Wt 218.2 lb

## 2021-11-10 DIAGNOSIS — C61 Malignant neoplasm of prostate: Secondary | ICD-10-CM | POA: Diagnosis not present

## 2021-11-10 DIAGNOSIS — R361 Hematospermia: Secondary | ICD-10-CM

## 2021-11-10 DIAGNOSIS — R3129 Other microscopic hematuria: Secondary | ICD-10-CM

## 2021-11-10 DIAGNOSIS — F411 Generalized anxiety disorder: Secondary | ICD-10-CM

## 2021-11-10 NOTE — Progress Notes (Signed)
Urological Symptom Review  Patient is experiencing the following symptoms: Burning/pain with urination Have to strain to urinate Blood in urine Weak stream   Review of Systems  Gastrointestinal (upper)  : Negative for upper GI symptoms  Gastrointestinal (lower) : Negative for lower GI symptoms  Constitutional : Night Sweats Fatigue  Skin: Skin rash/lesion  Eyes: Negative for eye symptoms  Ear/Nose/Throat : Sinus problems  Hematologic/Lymphatic: Negative for Hematologic/Lymphatic symptoms  Cardiovascular : Leg swelling Chest pain  Respiratory : Cough Shortness of breath  Endocrine: Excessive thirst  Musculoskeletal: Back pain Joint pain  Neurological: Headaches Dizziness  Psychologic: Negative for psychiatric symptoms

## 2021-11-10 NOTE — Progress Notes (Signed)
Assessment: 1. Malignant neoplasm of prostate ([place])  2. Hematospermia  3. Microscopic hematuria    Plan: Discussed pt presentation and evaluation with Dr. Diona Fanti who agrees with plan. Urine culture pending. Will contact pt if results abn. Microscopic hematuria chronic in nature. The pt is reassured and will keep his scheduled FU appt in 2 months. FU prn if he has concerns.  Chief Complaint: Blood in semen  HPI: Brandon Buck is a 61 y.o. male with a h/o RCC and prostate CA who presents for evaluation. The pt reports a 1 month h/o dark colored semen and is concerned that he has had a color change. He has no pain, burning, dysuria, frequency or gross hematuria. Pt states he is very pleased with urinary status and continues to have improvement of urgency and nocturia since his last visit. On Flomax. UA 3-10 RBCs, few bacteria. Culture in progress IPPS=20, QOL-2   6.29.2021: 61 year old male sent by Dr. Dennard Schaumann for evaluation of elevated PSA.  PSA was 5.3 on 4.8.2021.  In 2017, PSA was 3.10.  He does have a history of renal cell carcinoma and is status post remote right radical nephrectomy.   There is a strong family history of prostate cancer, in his father and grandfather.  Apparently they both died of the illness.    05/29/2020:He underwent ultrasound and biopsy of his prostate  Prostate volume 34.7 mL, PSA 5.3, PSAD 0.15.  3/12 cores revealed adenocarcinoma as follows:  Left apex lateral GS 3+3 and 95% of core Right apex medial GS 3+4 and 40% of core Right mid lateral, GS 3+3 in 10% of core     1.10.2022: I-125 brachytherapy, placement of SpaceOAR   9.20.2022: PSA 0.7. On flomax. IPSS 10   QoL score 2. No blood in urine or stool.  Portions of the above documentation were copied from a prior visit for review purposes only.  Allergies: Allergies  Allergen Reactions   Other     Pt. Reports having seizures after anesthesia once 1999 or 2000 none since   Keflex  [Cephalexin] Rash    PMH: Past Medical History:  Diagnosis Date   Anxiety    Arthritis    Asthma    Cancer ([place])    right renal cancer, right radical nephrectomy 2004   Chronic back pain    Chronic hip pain, left    CKD (chronic kidney disease) stage 3, GFR 30-59 ml/min (HCC)    ckd stage 3 b    Complication of anesthesia    1 seizure after anesthesia 1999 or 2000 none since , recent surgeries no seizures   Coronary artery disease    a. s/p prior PCI to RCA and LCx in 2009   Depression    GERD (gastroesophageal reflux disease)    Gout    no recent flares   Heart disease    High blood pressure    History of PTCA 2 2004   2 stents   History of stomach ulcers    Hypercholesteremia    Hypertension    Kidney disease    Myocardial infarction ([place]) 2004   Prostate cancer ([place])    3/12 cores upt to 40% grade 2 cancer. Planning brachytherapy (2021)   Proteinuria    Renal insufficiency    Tennis elbow     PSH: Past Surgical History:  Procedure Laterality Date   COLONOSCOPY WITH PROPOFOL N/A 10/28/2020   Procedure: COLONOSCOPY WITH PROPOFOL;  Surgeon: Harvel Quale, MD;  [facility];  Service: Gastroenterology;  Laterality: N/A;  2:30   coloscopy  10/28/2020   removed 7 polyps done at [place] N/A 11/03/2020   Procedure: CYSTOSCOPY;  Surgeon: Franchot Gallo, MD;  Location[facility];  Service: Urology;  Laterality: N/A;   Heart catherization  2004   2 stents to heart cypher left circulflex   HERNIA REPAIR  1999   left groin bilateral   HIP SURGERY Left 1999 or 2000   bond graft   Kidney removed Right 2003 or 2004   for cancer   POLYPECTOMY  10/28/2020   Procedure: POLYPECTOMY INTESTINAL;  Surgeon: Harvel Quale, MD;  [facility];  Service: Gastroenterology;;   PROSTATE BIOPSY  2021   RADIOACTIVE SEED IMPLANT N/A 11/03/2020   Procedure: RADIOACTIVE SEED IMPLANT/BRACHYTHERAPY IMPLANT;   Surgeon: Franchot Gallo, MD;  Location: [facility];  Service: Urology;  Laterality: N/A;  [place] N/A 11/03/2020   Procedure: SPACE OAR INSTILLATION;  Surgeon: Franchot Gallo, MD;  Location: [facility];  Service: Urology;  Laterality: N/A;    SH: Social History   Tobacco Use   Smoking status: Former    Packs/day: 1.50    Years: 32.00    Pack years: 48.00    Types: Cigarettes    Start date: 01/26/1971    Quit date: 01/26/2003    Years since quitting: 18.8   Smokeless tobacco: Never  Vaping Use   Vaping Use: Never used  Substance Use Topics   Alcohol use: Yes    Alcohol/week: 0.0 standard drinks    Comment: occ 2 week   Drug use: No    ROS: Constitutional:  Negative for fever, chills, weight loss CV: Negative for chest pain, previous MI, hypertension Respiratory:  Negative for shortness of breath, wheezing, sleep apnea, frequent cough GI:  Negative for nausea, vomiting, bloody stool, GERD  PE: BP (!) 190/78    Pulse 86    Wt 218 lb 3.2 oz (99 kg)    BMI 29.59 kg/m  GENERAL APPEARANCE:  Well appearing, well developed, well nourished, NAD HEENT:  Atraumatic, normocephalic, oropharynx clear NECK:  Supple  ABDOMEN:  Soft, non-tender, no masses EXTREMITIES:  Moves all extremities well, without clubbing, cyanosis, or edema NEUROLOGIC:  Alert and oriented x 3, normal gait, CN II-XII grossly intact MENTAL STATUS:  appropriate BACK:  Non-tender to palpation, No CVAT SKIN:  Warm, dry, and intact   Results: Results for orders placed or performed in visit on 11/09/21 (from the past 24 hour(s))  Microscopic Examination   Collection Time: 11/09/21  2:51 PM   Urine  Result Value Ref Range   WBC, UA None seen 0 - 5 /hpf   RBC 3-10 (A) 0 - 2 /hpf   Epithelial Cells (non renal) None seen 0 - 10 /hpf   Renal Epithel, UA None seen None seen /hpf   Mucus, UA Present Not Estab.   Bacteria, UA Few None seen/Few  Urinalysis,  Routine w reflex microscopic   Collection Time: 11/09/21  2:51 PM  Result Value Ref Range   Specific Gravity, UA 1.025 1.005 - 1.030   pH, UA 6.0 5.0 - 7.5   Color, UA Yellow Yellow   Appearance Ur Clear Clear   Leukocytes,UA Negative Negative   Protein,UA 2+ (A) Negative/Trace   Glucose, UA Negative Negative   Ketones, UA Negative Negative   RBC, UA 1+ (A) Negative   Bilirubin,  UA Negative Negative   Urobilinogen, Ur 0.2 0.2 - 1.0 mg/dL   Nitrite, UA Negative Negative   Microscopic Examination See below:

## 2021-11-10 NOTE — Telephone Encounter (Signed)
Xanax and hydrocodone refill.  Last seen 03/27/2021, xanax last filled 10/12/2021, hydrocodone last filled 10/12/2021.

## 2021-11-18 ENCOUNTER — Other Ambulatory Visit: Payer: Self-pay | Admitting: Family Medicine

## 2021-11-18 DIAGNOSIS — M1000 Idiopathic gout, unspecified site: Secondary | ICD-10-CM

## 2021-12-09 ENCOUNTER — Other Ambulatory Visit: Payer: Self-pay | Admitting: Family Medicine

## 2021-12-09 DIAGNOSIS — F411 Generalized anxiety disorder: Secondary | ICD-10-CM

## 2021-12-10 NOTE — Telephone Encounter (Signed)
LOV 03/27/21 Last refill: Hydrocodone  11/10/21, #90, 0 refills Xanax  11/10/21, #30, 0 refills  Please review, thanks!

## 2022-01-04 ENCOUNTER — Other Ambulatory Visit: Payer: Self-pay

## 2022-01-04 ENCOUNTER — Other Ambulatory Visit: Payer: PPO

## 2022-01-04 DIAGNOSIS — C61 Malignant neoplasm of prostate: Secondary | ICD-10-CM

## 2022-01-05 ENCOUNTER — Other Ambulatory Visit: Payer: Self-pay | Admitting: Family Medicine

## 2022-01-05 DIAGNOSIS — F411 Generalized anxiety disorder: Secondary | ICD-10-CM

## 2022-01-05 LAB — PSA: Prostate Specific Ag, Serum: 1 ng/mL (ref 0.0–4.0)

## 2022-01-05 NOTE — Telephone Encounter (Signed)
LOV 03/27/21 ? ?Norco ?Last refill 12/10/21, #90, 0 refills ? ?Xanax ?Last refill 12/10/21, #30, 0 refills ? ?Nicotrol ?Last refill 09/02/20, #168, 0 refills ? ?Please review, thanks! ? ?

## 2022-01-11 NOTE — Progress Notes (Signed)
? ?History of Present Illness: 6.29.2021: 61 year old male sent by Dr. Dennard Schaumann for evaluation of elevated PSA.  PSA was 5.3 on 4.8.2021.  In 2017, PSA was 3.10.  He does have a history of renal cell carcinoma and is status post remote right radical nephrectomy. ?  ?There is a strong family history of prostate cancer, in his father and grandfather.  Apparently they both died of the illness. ?  ? 01-Jun-2020:He underwent ultrasound and biopsy of his prostate  Prostate volume 34.7 mL, PSA 5.3, PSAD 0.15.  3/12 cores revealed adenocarcinoma as follows: ? Left apex lateral GS 3+3 and 95% of core ?Right apex medial GS 3+4 and 40% of core ?Right mid lateral, GS 3+3 in 10% of core ?  ?  ?1.10.2022: I-125 brachytherapy, placement of SpaceOAR ?  ?9.20.2022: PSA 0.7. On flomax. IPSS 10   QoL score 2.  ? ?3.21.2023: PSA 1.0.  He has minimal lower urinary tract symptoms, at least nonbothersome.  His IPSS is 17, quality-of-life score 1.  He rarely takes tamsulosin.  He has not had any blood in his urine or stool. ?Past Medical History:  ?Diagnosis Date  ? Anxiety   ? Arthritis   ? Asthma   ? Cancer [facility])   ? right renal cancer, right radical nephrectomy 2004  ? Chronic back pain   ? Chronic hip pain, left   ? CKD (chronic kidney disease) stage 3, GFR 30-59 ml/min (HCC)   ? ckd stage 3 b   ? Complication of anesthesia   ? 1 seizure after anesthesia 1999 or 2000 none since , recent surgeries no seizures  ? Coronary artery disease   ? a. s/p prior PCI to RCA and LCx in 2009  ? Depression   ? GERD (gastroesophageal reflux disease)   ? Gout   ? no recent flares  ? Heart disease   ? High blood pressure   ? History of PTCA 2 2004  ? 2 stents  ? History of stomach ulcers   ? Hypercholesteremia   ? Hypertension   ? Kidney disease   ? Myocardial infarction [facility]) 2004  ? Prostate cancer ([place])   ? 3/12 cores upt to 40% grade 2 cancer. Planning brachytherapy (2021)  ? Proteinuria   ? Renal insufficiency   ? Tennis elbow   ? ? ?Past Surgical  History:  ?Procedure Laterality Date  ? COLONOSCOPY WITH PROPOFOL N/A 10/28/2020  ? Procedure: COLONOSCOPY WITH PROPOFOL;  Surgeon: Harvel Quale, MD;  [facility];  Service: Gastroenterology;  Laterality: N/A;  2:30  ? coloscopy  10/28/2020  ? removed 7 polyps done at [place]  ? CYSTOSCOPY N/A 11/03/2020  ? Procedure: CYSTOSCOPY;  Surgeon: Franchot Gallo, MD;  Location: [facility];  Service: Urology;  Laterality: N/A;  ? Heart catherization  2004  ? 2 stents to heart cypher left circulflex  ? HERNIA REPAIR  1999  ? left groin bilateral  ? HIP SURGERY Left 1999 or 2000  ? bond graft  ? Kidney removed Right 2003 or 2004  ? for cancer  ? POLYPECTOMY  10/28/2020  ? Procedure: POLYPECTOMY INTESTINAL;  Surgeon: Harvel Quale, MD;  [facility];  Service: Gastroenterology;;  ? PROSTATE BIOPSY  2021  ? RADIOACTIVE SEED IMPLANT N/A 11/03/2020  ? Procedure: RADIOACTIVE SEED IMPLANT/BRACHYTHERAPY IMPLANT;  Surgeon: Franchot Gallo, MD;  Location[facility];  Service: Urology;  Laterality: N/A;  90 MINS  ? SPACE OAR INSTILLATION N/A  11/03/2020  ? Procedure: SPACE OAR INSTILLATION;  Surgeon: Franchot Gallo, MD;  Location: [facility];  Service: Urology;  Laterality: N/A;  ? ? ?Home Medications:  ?Allergies as of 01/12/2022   ? ?   Reactions  ? Other   ? Pt. Reports having seizures after anesthesia once 1999 or 2000 none since  ? Keflex [cephalexin] Rash  ? ?  ? ?  ?Medication List  ?  ? ?  ? Accurate as of January 11, 2022  9:19 AM. If you have any questions, ask your nurse or doctor.  ?  ?  ? ?  ? ?allopurinol 100 MG tablet ?Commonly known as: ZYLOPRIM ?TAKE 2 TABLETS BY MOUTH DAILY. ?  ?ALPRAZolam 0.5 MG tablet ?Commonly known as: Duanne Moron ?TAKE (1) TABLET BY MOUTH TWICE DAILY AS NEEDED FOR ANXIETY ?  ?aspirin 81 MG tablet ?Take 81 mg by mouth daily. ?  ?atorvastatin 80 MG tablet ?Commonly known as: LIPITOR ?TAKE (1) TABLET BY  MOUTH ONCE DAILY. ?  ?ezetimibe 10 MG tablet ?Commonly known as: ZETIA ?TAKE ONE TABLET BY MOUTH ONCE DAILY. ?  ?fluticasone 50 MCG/ACT nasal spray ?Commonly known as: FLONASE ?Place 2 sprays into both nostrils daily. ?  ?furosemide 20 MG tablet ?Commonly known as: LASIX ?Take 1 tablet (20 mg total) by mouth daily. ?  ?HYDROcodone-acetaminophen 5-325 MG tablet ?Commonly known as: NORCO/VICODIN ?TAKE 1 TABLET BY MOUTH THREE TIMES DAILY AS NEEDED FOR MODERATE PAIN. ?  ?lisinopril 20 MG tablet ?Commonly known as: ZESTRIL ?Take 20 mg by mouth daily. ?  ?metoprolol tartrate 50 MG tablet ?Commonly known as: LOPRESSOR ?Take 1 tablet (50 mg total) by mouth 2 (two) times daily. ?  ?Nicotrol 10 MG inhaler ?Generic drug: nicotine ?INHALE 1 CARTRIDGE INTO THE LUNGS AS NEEDED FOR SMOKING CESSATION. ?  ?nitroGLYCERIN 0.4 MG SL tablet ?Commonly known as: NITROSTAT ?Place 1 tablet (0.4 mg total) under the tongue every 5 (five) minutes as needed. ?  ?pantoprazole 40 MG tablet ?Commonly known as: PROTONIX ?Take 1 tablet (40 mg total) by mouth 2 (two) times daily. ?  ?tamsulosin 0.4 MG Caps capsule ?Commonly known as: FLOMAX ?Take 1 capsule (0.4 mg total) by mouth daily after supper. ?  ?triamcinolone cream 0.1 % ?Commonly known as: KENALOG ?Apply 1 application topically 2 (two) times daily. ?  ?URISTAT PO ?Take by mouth. ?  ? ?  ? ? ?Allergies:  ?Allergies  ?Allergen Reactions  ? Other   ?  Pt. Reports having seizures after anesthesia once 1999 or 2000 none since  ? Keflex [Cephalexin] Rash  ? ? ?Family History  ?Problem Relation Age of Onset  ? Heart disease Other   ? Cancer Other   ? Kidney disease Other   ? Heart disease Father   ? Prostate cancer Father   ? Heart disease Mother   ? Lung cancer Mother   ? Kidney cancer Mother   ? Prostate cancer Maternal Uncle   ? Bladder Cancer Maternal Grandfather   ? Breast cancer Neg Hx   ? Colon cancer Neg Hx   ? Pancreatic cancer Neg Hx   ? ? ?Social History:  reports that he quit smoking  about 18 years ago. His smoking use included cigarettes. He started smoking about 50 years ago. He has a 48.00 pack-year smoking history. He has never used smokeless tobacco. He reports current alcohol use. He reports that he does not use drugs. ? ?ROS: ?A complete review of systems was performed.  All systems  are negative except for pertinent findings as noted. ? ?Physical Exam:  ?Vital signs in last 24 hours: ?There were no vitals taken for this visit. ?Constitutional:  Alert and oriented, No acute distress ?Cardiovascular: Regular rate  ?Respiratory: Normal respiratory effort ?GI: Abdomen is soft, nontender, nondistended, no abdominal masses. No CVAT.  Right inguinal hernia present. ?Genitourinary: Normal male phallus, testes are descended bilaterally and non-tender and without masses, scrotum is normal in appearance without lesions or masses, perineum is normal on inspection.  Prostate is smooth, flat, no nodules. ?Lymphatic: No lymphadenopathy ?Neurologic: Grossly intact, no focal deficits ?Psychiatric: Normal mood and affect ? ?I have reviewed prior pt notes ? ?I have reviewed urinalysis results ? ?I have independently reviewed prior imaging--prior prostate volume 34.7 mL ? ?I have reviewed prior PSA/pathology results ? ? ? ?Impression/Assessment:  ?Favorable intermediate risk prostate cancer, status post I-125 brachytherapy/SpaceOAR in January, 2022.  PSA stable, fairly low.  Benign exam today. ? ?History of BPH with no bothersome symptoms, not on alpha-blocker therapy ? ?Persistent microscopic hematuria, stable ? ?Plan:  ?I will see him back in 6 months following PSA ? ?We will set up an appointment for him to see a general surgeon regarding management of his symptomatic right inguinal hernia. ? ?

## 2022-01-12 ENCOUNTER — Encounter: Payer: Self-pay | Admitting: Urology

## 2022-01-12 ENCOUNTER — Other Ambulatory Visit: Payer: Self-pay

## 2022-01-12 ENCOUNTER — Ambulatory Visit: Payer: PPO | Admitting: Urology

## 2022-01-12 VITALS — BP 205/98 | HR 90

## 2022-01-12 DIAGNOSIS — N138 Other obstructive and reflux uropathy: Secondary | ICD-10-CM

## 2022-01-12 DIAGNOSIS — K4090 Unilateral inguinal hernia, without obstruction or gangrene, not specified as recurrent: Secondary | ICD-10-CM

## 2022-01-12 DIAGNOSIS — R3915 Urgency of urination: Secondary | ICD-10-CM

## 2022-01-12 DIAGNOSIS — C61 Malignant neoplasm of prostate: Secondary | ICD-10-CM

## 2022-01-12 DIAGNOSIS — N401 Enlarged prostate with lower urinary tract symptoms: Secondary | ICD-10-CM | POA: Diagnosis not present

## 2022-01-12 LAB — URINALYSIS, ROUTINE W REFLEX MICROSCOPIC
Bilirubin, UA: NEGATIVE
Glucose, UA: NEGATIVE
Ketones, UA: NEGATIVE
Leukocytes,UA: NEGATIVE
Nitrite, UA: NEGATIVE
Specific Gravity, UA: >1.03 — ABNORMAL HIGH (ref 1.005–1.030)
Urobilinogen, Ur: 1 mg/dL (ref 0.2–1.0)
pH, UA: 6 (ref 5.0–7.5)

## 2022-01-12 LAB — MICROSCOPIC EXAMINATION
Bacteria, UA: NONE SEEN
Epithelial Cells (non renal): NONE SEEN /hpf (ref 0–10)
Renal Epithel, UA: NONE SEEN /hpf
WBC, UA: NONE SEEN /hpf (ref 0–5)

## 2022-01-13 ENCOUNTER — Other Ambulatory Visit: Payer: Self-pay | Admitting: Family Medicine

## 2022-01-13 DIAGNOSIS — M1000 Idiopathic gout, unspecified site: Secondary | ICD-10-CM

## 2022-01-18 ENCOUNTER — Telehealth: Payer: Self-pay | Admitting: Pharmacist

## 2022-01-18 NOTE — Progress Notes (Signed)
? ? ?  Chronic Care Management ?Pharmacy Assistant  ? ?Name: Brandon Buck  MRN: [id number] DOB: 04/12/1961 ? ? ?Reason for Encounter: Disease State - General Adherence Call ?  ? ? ?Recent office visits:  ?None noted.  ? ?Recent consult visits:  ?01/12/22 Franchot Gallo, MD - Urology - Malignant neoplasm of prostate - Labs were ordered. Referral placed to General Surgery. Follow up in 6 months.  ? ?11/10/21 Julienne Summerlin PA-C - Urology - Malignant Neoplasm, of prostate - No medication changes. Follow up in 2 months  ? ?Hospital visits:  ?None in previous 6 months ? ?Medications: ?Outpatient Encounter Medications as of 01/18/2022  ?Medication Sig Note  ? allopurinol (ZYLOPRIM) 100 MG tablet TAKE 2 TABLETS BY MOUTH DAILY.   ? ALPRAZolam (XANAX) 0.5 MG tablet TAKE (1) TABLET BY MOUTH TWICE DAILY AS NEEDED FOR ANXIETY   ? aspirin 81 MG tablet Take 81 mg by mouth daily.   ? atorvastatin (LIPITOR) 80 MG tablet TAKE (1) TABLET BY MOUTH ONCE DAILY.   ? ezetimibe (ZETIA) 10 MG tablet TAKE ONE TABLET BY MOUTH ONCE DAILY.   ? fluticasone (FLONASE) 50 MCG/ACT nasal spray Place 2 sprays into both nostrils daily. 10/02/2020: Per patient as needed.  ? furosemide (LASIX) 20 MG tablet Take 1 tablet (20 mg total) by mouth daily.   ? HYDROcodone-acetaminophen (NORCO/VICODIN) 5-325 MG tablet TAKE 1 TABLET BY MOUTH THREE TIMES DAILY AS NEEDED FOR MODERATE PAIN.   ? lisinopril (ZESTRIL) 20 MG tablet Take 20 mg by mouth daily.   ? metoprolol tartrate (LOPRESSOR) 50 MG tablet Take 1 tablet (50 mg total) by mouth 2 (two) times daily.   ? NICOTROL 10 MG inhaler INHALE 1 CARTRIDGE INTO THE LUNGS AS NEEDED FOR SMOKING CESSATION.   ? nitroGLYCERIN (NITROSTAT) 0.4 MG SL tablet Place 1 tablet (0.4 mg total) under the tongue every 5 (five) minutes as needed.   ? pantoprazole (PROTONIX) 40 MG tablet Take 1 tablet (40 mg total) by mouth 2 (two) times daily.   ? Phenazopyridine HCl (URISTAT PO) Take by mouth.   ? tamsulosin (FLOMAX) 0.4 MG CAPS  capsule Take 1 capsule (0.4 mg total) by mouth daily after supper.   ? triamcinolone cream (KENALOG) 0.1 % Apply 1 application topically 2 (two) times daily.   ? ?No facility-administered encounter medications on file as of 01/18/2022.  ? ? ?Have you had any problems recently with your health? ? ? ?Have you had any problems with your pharmacy? ? ? ?What issues or side effects are you having with your medications? ? ? ?What would you like me to pass along to Leata Mouse, CPP for them to help you with?  ? ? ?What can we do to take care of you better? ? ? ?Care Gaps ?  ?AWV: done 07/24/21 ?Colonoscopy: done 10/28/2020 ?DM Eye Exam: N/A ?DM Foot Exam: N/A ?Microalbumin: N/A ?HbgAIC:  N/A ?DEXA: N/A ?Mammogram: N/A ? ? ?Star Rating Drugs: ?lisinopril (ZESTRIL) 20 MG tablet - last filled 11/18/21 90 days  ?atorvastatin (LIPITOR) 80 MG tablet - last filled 07/22/21 90 days  ? ? ?Future Appointments  ?Date Time Provider [place]  ?07/20/2022 10:45 AM Franchot Gallo, MD [facility] None  ?07/29/2022  2:45 PM [facility]-NURSE HEALTH ADVISOR [facility]  ? ?Multiple attempts were made to contact patient. Attempts were unsuccessful. / ls,CMA  ? ? ?Liza Showfety, CCMA ?Clinical Pharmacist Assistant  ?[phone] ? ?

## 2022-02-08 ENCOUNTER — Other Ambulatory Visit: Payer: Self-pay | Admitting: Family Medicine

## 2022-02-08 DIAGNOSIS — F411 Generalized anxiety disorder: Secondary | ICD-10-CM

## 2022-02-08 NOTE — Telephone Encounter (Signed)
LOV 03/27/21 ?Last refill 01/07/22, #30, 0 refills ? ?Please review, thanks! ? ?

## 2022-02-09 ENCOUNTER — Other Ambulatory Visit: Payer: Self-pay | Admitting: Family Medicine

## 2022-02-09 NOTE — Telephone Encounter (Signed)
LOV 03/27/21 ?Last refill 01/07/22, #90, 0 refills ? ?Please review, thanks! ? ?

## 2022-02-19 ENCOUNTER — Other Ambulatory Visit: Payer: Self-pay

## 2022-02-19 ENCOUNTER — Emergency Department (HOSPITAL_COMMUNITY)
Admission: EM | Admit: 2022-02-19 | Discharge: 2022-02-19 | Disposition: A | Discharge status: Home / Self Care | Payer: PPO | Attending: Emergency Medicine | Admitting: Emergency Medicine

## 2022-02-19 ENCOUNTER — Emergency Department (HOSPITAL_COMMUNITY): Payer: PPO

## 2022-02-19 DIAGNOSIS — R1032 Left lower quadrant pain: Secondary | ICD-10-CM | POA: Diagnosis present

## 2022-02-19 DIAGNOSIS — I10 Essential (primary) hypertension: Secondary | ICD-10-CM | POA: Diagnosis not present

## 2022-02-19 DIAGNOSIS — Z79899 Other long term (current) drug therapy: Secondary | ICD-10-CM | POA: Diagnosis not present

## 2022-02-19 DIAGNOSIS — K5792 Diverticulitis of intestine, part unspecified, without perforation or abscess without bleeding: Secondary | ICD-10-CM

## 2022-02-19 DIAGNOSIS — J45909 Unspecified asthma, uncomplicated: Secondary | ICD-10-CM | POA: Insufficient documentation

## 2022-02-19 DIAGNOSIS — Z85528 Personal history of other malignant neoplasm of kidney: Secondary | ICD-10-CM | POA: Insufficient documentation

## 2022-02-19 DIAGNOSIS — Z7951 Long term (current) use of inhaled steroids: Secondary | ICD-10-CM | POA: Insufficient documentation

## 2022-02-19 DIAGNOSIS — Z87891 Personal history of nicotine dependence: Secondary | ICD-10-CM | POA: Diagnosis not present

## 2022-02-19 DIAGNOSIS — Z7982 Long term (current) use of aspirin: Secondary | ICD-10-CM | POA: Insufficient documentation

## 2022-02-19 DIAGNOSIS — I129 Hypertensive chronic kidney disease with stage 1 through stage 4 chronic kidney disease, or unspecified chronic kidney disease: Secondary | ICD-10-CM | POA: Insufficient documentation

## 2022-02-19 DIAGNOSIS — N1832 Chronic kidney disease, stage 3b: Secondary | ICD-10-CM | POA: Insufficient documentation

## 2022-02-19 DIAGNOSIS — K5732 Diverticulitis of large intestine without perforation or abscess without bleeding: Secondary | ICD-10-CM | POA: Insufficient documentation

## 2022-02-19 DIAGNOSIS — K6389 Other specified diseases of intestine: Secondary | ICD-10-CM | POA: Diagnosis not present

## 2022-02-19 DIAGNOSIS — R1084 Generalized abdominal pain: Secondary | ICD-10-CM | POA: Diagnosis not present

## 2022-02-19 DIAGNOSIS — R52 Pain, unspecified: Secondary | ICD-10-CM | POA: Diagnosis not present

## 2022-02-19 LAB — CBC WITH DIFFERENTIAL/PLATELET
Abs Immature Granulocytes: 0.03 10*3/uL (ref 0.00–0.07)
Basophils Absolute: 0 10*3/uL (ref 0.0–0.1)
Basophils Relative: 0 %
Eosinophils Absolute: 0 10*3/uL (ref 0.0–0.5)
Eosinophils Relative: 1 %
HCT: 42 % (ref 39.0–52.0)
Hemoglobin: 14.1 g/dL (ref 13.0–17.0)
Immature Granulocytes: 0 %
Lymphocytes Relative: 10 %
Lymphs Abs: 0.7 10*3/uL (ref 0.7–4.0)
MCH: 31.5 pg (ref 26.0–34.0)
MCHC: 33.6 g/dL (ref 30.0–36.0)
MCV: 93.8 fL (ref 80.0–100.0)
Monocytes Absolute: 0.5 10*3/uL (ref 0.1–1.0)
Monocytes Relative: 7 %
Neutro Abs: 6.2 10*3/uL (ref 1.7–7.7)
Neutrophils Relative %: 82 %
Platelets: 167 10*3/uL (ref 150–400)
RBC: 4.48 MIL/uL (ref 4.22–5.81)
RDW: 12.9 % (ref 11.5–15.5)
WBC: 7.5 10*3/uL (ref 4.0–10.5)
nRBC: 0 % (ref 0.0–0.2)

## 2022-02-19 LAB — COMPREHENSIVE METABOLIC PANEL
ALT: 22 U/L (ref 0–44)
AST: 22 U/L (ref 15–41)
Albumin: 3.7 g/dL (ref 3.5–5.0)
Alkaline Phosphatase: 67 U/L (ref 38–126)
Anion gap: 7 (ref 5–15)
BUN: 14 mg/dL (ref 8–23)
CO2: 24 mmol/L (ref 22–32)
Calcium: 8.7 mg/dL — ABNORMAL LOW (ref 8.9–10.3)
Chloride: 105 mmol/L (ref 98–111)
Creatinine, Ser: 1.45 mg/dL — ABNORMAL HIGH (ref 0.61–1.24)
GFR, Estimated: 54 mL/min — ABNORMAL LOW (ref >60)
Glucose, Bld: 98 mg/dL (ref 70–99)
Potassium: 3.8 mmol/L (ref 3.5–5.1)
Sodium: 136 mmol/L (ref 135–145)
Total Bilirubin: 1.3 mg/dL — ABNORMAL HIGH (ref 0.3–1.2)
Total Protein: 6.8 g/dL (ref 6.5–8.1)

## 2022-02-19 LAB — URINALYSIS, ROUTINE W REFLEX MICROSCOPIC
Bacteria, UA: NONE SEEN
Bilirubin Urine: NEGATIVE
Glucose, UA: NEGATIVE mg/dL
Ketones, ur: NEGATIVE mg/dL
Leukocytes,Ua: NEGATIVE
Nitrite: NEGATIVE
Protein, ur: 100 mg/dL — AB
Specific Gravity, Urine: 1.014 (ref 1.005–1.030)
pH: 6 (ref 5.0–8.0)

## 2022-02-19 LAB — LIPASE, BLOOD: Lipase: 24 U/L (ref 11–51)

## 2022-02-19 MED ORDER — SODIUM CHLORIDE 0.9 % IV BOLUS
500.0000 mL | Freq: Once | INTRAVENOUS | Status: AC
  Administered 2022-02-19: 500 mL via INTRAVENOUS

## 2022-02-19 MED ORDER — FENTANYL CITRATE PF 50 MCG/ML IJ SOSY
25.0000 ug | PREFILLED_SYRINGE | Freq: Once | INTRAMUSCULAR | Status: AC
  Administered 2022-02-19: 25 ug via INTRAVENOUS
  Filled 2022-02-19: qty 1

## 2022-02-19 MED ORDER — IOHEXOL 300 MG/ML  SOLN
100.0000 mL | Freq: Once | INTRAMUSCULAR | Status: AC | PRN
  Administered 2022-02-19: 75 mL via INTRAVENOUS

## 2022-02-19 MED ORDER — AMOXICILLIN-POT CLAVULANATE 875-125 MG PO TABS
1.0000 | ORAL_TABLET | Freq: Two times a day (BID) | ORAL | 0 refills | Status: DC
Start: 2022-02-19 — End: 2022-04-28

## 2022-02-19 MED ORDER — AMOXICILLIN-POT CLAVULANATE 875-125 MG PO TABS
1.0000 | ORAL_TABLET | Freq: Once | ORAL | Status: AC
  Administered 2022-02-19: 1 via ORAL
  Filled 2022-02-19: qty 1

## 2022-02-19 MED ORDER — FENTANYL CITRATE PF 50 MCG/ML IJ SOSY
50.0000 ug | PREFILLED_SYRINGE | Freq: Once | INTRAMUSCULAR | Status: AC
  Administered 2022-02-19: 50 ug via INTRAVENOUS
  Filled 2022-02-19: qty 1

## 2022-02-19 NOTE — ED Triage Notes (Signed)
Pt arrived by EMS for LLQ abdominal pain that started yesterday after lunch.  ? ?No n/v/d.  ?Multiple abdominal surgery and prostate cancer ? ?

## 2022-02-19 NOTE — Discharge Instructions (Addendum)
Please take antibiotics as prescribed.  You may take Tylenol for pain.  The antibiotics should take care of your infection.  I would like for you to follow-up with your primary care provider next week for further evaluation.  Return to the emergency department for any worsening symptoms you might have. ?

## 2022-02-19 NOTE — ED Notes (Signed)
Patient transported to CT 

## 2022-02-19 NOTE — ED Provider Notes (Signed)
?[place] ?Provider Note ? ? ?CSN: [id number] ?Arrival date & time: 02/19/22  1413 ? ?  ? ?History ?Chief Complaint  ?Patient presents with  ? Abdominal Pain  ? ? ?Brandon Buck is a 61 y.o. male with surgical history of right total nephrectomy, prostate cancer on chemotherapy, hypertension, hyperlipidemia who presents to the emergency department today with left lower quadrant abdominal pain that started yesterday after he ate lunch.  Pain has been constant and worsening.  He denies any urinary complaints including urinary urgency, frequency, hematuria, dysuria.  Denies nausea and vomiting and diarrhea.  Patient had a normal bowel movement yesterday.  No bowel movement today.  Patient did state that he woke up with a drenching sweat and chills this morning. ? ? ?Abdominal Pain ? ?  ? ?Home Medications ?Prior to Admission medications   ?Medication Sig Start Date End Date Taking? Authorizing Provider  ?amoxicillin-clavulanate (AUGMENTIN) 875-125 MG tablet Take 1 tablet by mouth every 12 (twelve) hours. 02/19/22  Yes Errica Dutil M, PA-C  ?allopurinol (ZYLOPRIM) 100 MG tablet TAKE 2 TABLETS BY MOUTH DAILY. 01/13/22   [name], MD  ?ALPRAZolam Duanne Moron) 0.5 MG tablet TAKE (1) TABLET BY MOUTH TWICE DAILY AS NEEDED FOR ANXIETY 02/08/22   [name], MD  ?aspirin 81 MG tablet Take 81 mg by mouth daily.    [provider]  ?atorvastatin (LIPITOR) 80 MG tablet TAKE (1) TABLET BY MOUTH ONCE DAILY. 11/18/21   [name], MD  ?ezetimibe (ZETIA) 10 MG tablet TAKE ONE TABLET BY MOUTH ONCE DAILY. 01/05/22   [name], MD  ?fluticasone (FLONASE) 50 MCG/ACT nasal spray Place 2 sprays into both nostrils daily. 01/29/19   [name], MD  ?furosemide (LASIX) 20 MG tablet Take 1 tablet (20 mg total) by mouth daily. 03/27/21   [name], MD  ?HYDROcodone-acetaminophen (NORCO/VICODIN) 5-325 MG tablet TAKE 1 TABLET BY MOUTH THREE TIMES DAILY AS NEEDED FOR MODERATE PAIN.  02/09/22   [name], MD  ?lisinopril (ZESTRIL) 20 MG tablet Take 20 mg by mouth daily.    [provider]  ?metoprolol tartrate (LOPRESSOR) 50 MG tablet Take 1 tablet (50 mg total) by mouth 2 (two) times daily. 12/24/20 03/24/21  Isaiah Serge, NP  ?NICOTROL 10 MG inhaler INHALE 1 CARTRIDGE INTO THE LUNGS AS NEEDED FOR SMOKING CESSATION. 01/07/22   [name], MD  ?nitroGLYCERIN (NITROSTAT) 0.4 MG SL tablet Place 1 tablet (0.4 mg total) under the tongue every 5 (five) minutes as needed. 12/24/20   Isaiah Serge, NP  ?pantoprazole (PROTONIX) 40 MG tablet Take 1 tablet (40 mg total) by mouth 2 (two) times daily. 03/27/21   [name], MD  ?Phenazopyridine HCl (URISTAT PO) Take by mouth.    [provider]  ?tamsulosin (FLOMAX) 0.4 MG CAPS capsule Take 1 capsule (0.4 mg total) by mouth daily after supper. 12/09/20   Franchot Gallo, MD  ?triamcinolone cream (KENALOG) 0.1 % Apply 1 application topically 2 (two) times daily. 05/11/21   Scot Jun, FNP  ?   ? ?Allergies    ?Other and Keflex [cephalexin]   ? ?Review of Systems   ?Review of Systems  ?Gastrointestinal:  Positive for abdominal pain.  ?All other systems reviewed and are negative. ? ?Physical Exam ?Updated Vital Signs ?BP (!) 171/76   Pulse 74   Temp 98.5 ?F (36.9 ?C) (Oral)   Resp 15   Ht 6' (1.829 m)  Wt 102.1 kg   SpO2 98%   BMI 30.52 kg/m?  ?Physical Exam ?Vitals and nursing note reviewed.  ?Constitutional:   ?   General: He is not in acute distress. ?   Appearance: Normal appearance.  ?HENT:  ?   Head: Normocephalic and atraumatic.  ?Eyes:  ?   General:     ?   Right eye: No discharge.     ?   Left eye: No discharge.  ?Cardiovascular:  ?   Comments: Regular rate and rhythm.  S1/S2 are distinct without any evidence of murmur, rubs, or gallops.  Radial pulses are 2+ bilaterally.  Dorsalis pedis pulses are 2+ bilaterally.  No evidence of pedal edema. ?Pulmonary:  ?   Comments: Clear to auscultation  bilaterally.  Normal effort.  No respiratory distress.  No evidence of wheezes, rales, or rhonchi heard throughout. ?Abdominal:  ?   General: Abdomen is flat. Bowel sounds are normal. There is no distension.  ?   Tenderness: There is abdominal tenderness in the left lower quadrant. There is guarding. There is no rebound. Negative signs include psoas sign.  ?Musculoskeletal:     ?   General: Normal range of motion.  ?   Cervical back: Neck supple.  ?Skin: ?   General: Skin is warm and dry.  ?   Findings: No rash.  ?Neurological:  ?   General: No focal deficit present.  ?   Mental Status: He is alert.  ?Psychiatric:     ?   Mood and Affect: Mood normal.     ?   Behavior: Behavior normal.  ? ? ?ED Results / Procedures / Treatments   ?Labs ?(all labs ordered are listed, but only abnormal results are displayed) ?Labs Reviewed  ?COMPREHENSIVE METABOLIC PANEL - Abnormal; Notable for the following components:  ?    Result Value  ? Creatinine, Ser 1.45 (*)   ? Calcium 8.7 (*)   ? Total Bilirubin 1.3 (*)   ? GFR, Estimated 54 (*)   ? All other components within normal limits  ?URINALYSIS, ROUTINE W REFLEX MICROSCOPIC - Abnormal; Notable for the following components:  ? Hgb urine dipstick SMALL (*)   ? Protein, ur 100 (*)   ? All other components within normal limits  ?CBC WITH DIFFERENTIAL/PLATELET  ?LIPASE, BLOOD  ? ? ?EKG ?None ? ?Radiology ?CT ABDOMEN PELVIS W CONTRAST ? ?Result Date: 02/19/2022 ?CLINICAL DATA:  Left lower quadrant abdominal pain. History of prostate cancer. EXAM: CT ABDOMEN AND PELVIS WITH CONTRAST TECHNIQUE: Multidetector CT imaging of the abdomen and pelvis was performed using the standard protocol following bolus administration of intravenous contrast. RADIATION DOSE REDUCTION: This exam was performed according to the departmental dose-optimization program which includes automated exposure control, adjustment of the mA and/or kV according to patient size and/or use of iterative reconstruction technique.  CONTRAST:  94m OMNIPAQUE IOHEXOL 300 MG/ML  SOLN COMPARISON:  CT abdomen and pelvis 09/09/2020 FINDINGS: Lower chest: Chronic scarring along the periphery of the left lower lobe. No pleural effusions. Hepatobiliary: 4 mm hypodensity in the posterior right hepatic lobe is likely an incidental finding. No suspicious hepatic lesions. No biliary dilatation. Main portal venous system is patent. Normal appearance of the gallbladder without distension. Pancreas: Unremarkable. No pancreatic ductal dilatation or surrounding inflammatory changes. Spleen: Normal in size without focal abnormality. Adrenals/Urinary Tract: Normal adrenal glands. Status post right nephrectomy. Small low-density structures in left kidney likely represent cysts and do not require dedicated follow-up. Left kidney has a  lobulated appearance without hydronephrosis. Normal appearance of the urinary bladder. Stomach/Bowel: Colonic wall thickening and inflammation in the left lower quadrant of the abdomen near the junction of the descending colon and sigmoid colon. There are multiple diverticula in this area. Findings are most likely associated with acute diverticulitis. No evidence for extraluminal gas or fluid collection. Normal appendix. Normal appearance of the small bowel. No evidence for bowel obstruction. Normal appearance of the stomach. Vascular/Lymphatic: Diffuse atherosclerotic calcifications in the abdominal aorta without aneurysm. No lymph node enlargement in the abdomen or pelvis. Reproductive: Brachytherapy seeds throughout the prostate. Other: Negative for ascites. Negative for free air. Right inguinal hernia containing fat. Probable prior hernia repair in the left inguinal region. Musculoskeletal: No acute bone abnormality. Stable sclerosis involving the left humeral head and suspect prior surgery in this area. IMPRESSION: 1. Focal colonic inflammation in the left lower quadrant of the abdomen involving the descending colon/proximal  sigmoid colon region. Findings are most likely associated with acute diverticulitis. Multiple diverticula in this area. Recommend follow-up imaging or follow-up colonoscopy to ensure resolution and exclude underlying

## 2022-03-10 ENCOUNTER — Other Ambulatory Visit: Payer: Self-pay | Admitting: Family Medicine

## 2022-03-10 DIAGNOSIS — F411 Generalized anxiety disorder: Secondary | ICD-10-CM

## 2022-03-10 NOTE — Telephone Encounter (Signed)
Requested medications are due for refill today.  yes ? ?Requested medications are on the active medications list.  yes ? ?Last refill. Norco refilled 02/09/2022 #90 0 refills, Xanax refilled 02/08/2022 #30 0 refills ? ?Future visit scheduled.   no ? ?Notes to clinic.  Medication refills are not delegated. ? ? ? ?Requested Prescriptions  ?Pending Prescriptions Disp Refills  ? HYDROcodone-acetaminophen (NORCO/VICODIN) 5-325 MG tablet [Pharmacy Med Name: HYDROCODONE/APAP 5/'325MG'$ ] 90 tablet 0  ?  Sig: TAKE 1 TABLET BY MOUTH THREE TIMES DAILY AS NEEDED FOR MODERATE PAIN.  ?  ? Not Delegated - Analgesics:  Opioid Agonist Combinations Failed - 03/10/2022 10:05 AM  ?  ?  Failed - This refill cannot be delegated  ?  ?  Failed - Urine Drug Screen completed in last 360 days  ?  ?  Failed - Valid encounter within last 3 months  ?  Recent Outpatient Visits   ? ?      ? 11 months ago ASCVD (arteriosclerotic cardiovascular disease)  ? [facility] [place], [name], MD  ? 1 year ago Pure hypercholesterolemia  ? [facility] Pickard, [name], MD  ? 2 years ago Benign essential HTN  ? [facility] [place], [name], MD  ? 2 years ago Acute idiopathic gout, unspecified site  ? [facility] Pickard, [name], MD  ? 3 years ago Acute bacterial rhinosinusitis  ? [facility] Pickard, [name], MD  ? ?  ?  ?Future Appointments   ? ?        ? In 4 months Franchot Gallo, MD [place]  ? ?  ? ? ?  ?  ?  ? ALPRAZolam (XANAX) 0.5 MG tablet [Pharmacy Med Name: ALPRAZOLAM 0.5 MG TABLET] 30 tablet 0  ?  Sig: TAKE (1) TABLET BY MOUTH TWICE DAILY AS NEEDED FOR ANXIETY  ?  ? Not Delegated - Psychiatry: Anxiolytics/Hypnotics 2 Failed - 03/10/2022 10:05 AM  ?  ?  Failed - This refill cannot be delegated  ?  ?  Failed - Urine Drug Screen completed in last 360 days  ?  ?  Failed - Valid encounter within last 6 months  ?  Recent Outpatient Visits   ? ?       ? 11 months ago ASCVD (arteriosclerotic cardiovascular disease)  ? [facility] Pickard, [name], MD  ? 1 year ago Pure hypercholesterolemia  ? [facility] Pickard, [name], MD  ? 2 years ago Benign essential HTN  ? [facility] Pickard, [name], MD  ? 2 years ago Acute idiopathic gout, unspecified site  ? [facility] Pickard, [name], MD  ? 3 years ago Acute bacterial rhinosinusitis  ? [facility] Pickard, [name], MD  ? ?  ?  ?Future Appointments   ? ?        ? In 4 months Franchot Gallo, MD [place]  ? ?  ? ? ?  ?  ?  Passed - Patient is not pregnant  ?  ?  ?  ?

## 2022-03-11 NOTE — Telephone Encounter (Signed)
LOV 03/27/21, NO FUTURE OV SCHEDULED  Norco Last refill 02/09/22, #90, 0 refills  Xanax Last refill 02/08/22, #30, 0 refills  Please review, thanks!

## 2022-04-12 ENCOUNTER — Other Ambulatory Visit: Payer: Self-pay | Admitting: Family Medicine

## 2022-04-12 DIAGNOSIS — F411 Generalized anxiety disorder: Secondary | ICD-10-CM

## 2022-04-12 NOTE — Telephone Encounter (Signed)
  xanax LOV 03/27/21 Last refill 03/11/22, #30, 0 refills  Hydrocodone LOV 03/27/21 Last refill 03/11/22, #90, 0 refills  Pt has not bee seen since 03/27/21?   Pls advice?

## 2022-04-12 NOTE — Telephone Encounter (Signed)
   Requested medication (s) are due for refill today: yes  Requested medication (s) are on the active medication list: yes  Last refill:  Hydrocodone 03/11/22 #90 with 0 RF, Xanax 03/11/22 #30 with 0 RF  Future visit scheduled: no, last seen 03/27/2021  Notes to clinic:  These medications can not be delegated, please assess.     Requested Prescriptions  Pending Prescriptions Disp Refills   ALPRAZolam (XANAX) 0.5 MG tablet [Pharmacy Med Name: ALPRAZOLAM 0.5 MG TABLET] 30 tablet 0    Sig: TAKE (1) TABLET BY MOUTH TWICE DAILY AS NEEDED FOR ANXIETY     Not Delegated - Psychiatry: Anxiolytics/Hypnotics 2 Failed - 04/12/2022  3:07 PM      Failed - This refill cannot be delegated      Failed - Urine Drug Screen completed in last 360 days      Failed - Valid encounter within last 6 months    Recent Outpatient Visits           1 year ago ASCVD (arteriosclerotic cardiovascular disease)   [place] [name], MD   1 year ago Pure hypercholesterolemia   [place], [name], MD   2 years ago Benign essential HTN   [place] [name], MD   3 years ago Acute idiopathic gout, unspecified site   [place] Pickard, [name], MD   3 years ago Acute bacterial rhinosinusitis   [place] Pickard, [name], MD       Future Appointments             In 3 months Dahlstedt, Annie Main, MD [place] - Patient is not pregnant       HYDROcodone-acetaminophen (NORCO/VICODIN) 5-325 MG tablet [Pharmacy Med Name: HYDROCODONE/APAP 5/'325MG'$ ] 90 tablet 0    Sig: TAKE 1 TABLET BY MOUTH THREE TIMES DAILY AS NEEDED FOR MODERATE PAIN.     Not Delegated - Analgesics:  Opioid Agonist Combinations Failed - 04/12/2022  3:07 PM      Failed - This refill cannot be delegated      Failed - Urine Drug Screen completed in last 360 days      Failed - Valid  encounter within last 3 months    Recent Outpatient Visits           1 year ago ASCVD (arteriosclerotic cardiovascular disease)   [place] [name], MD   1 year ago Pure hypercholesterolemia   [place] [name], MD   2 years ago Benign essential HTN   [place] [name], MD   3 years ago Acute idiopathic gout, unspecified site   [place] Pickard, [name], MD   3 years ago Acute bacterial rhinosinusitis   [place] Pickard, [name], MD       Future Appointments             In 3 months Dahlstedt, Annie Main, MD [place]

## 2022-04-28 ENCOUNTER — Encounter: Payer: Self-pay | Admitting: Emergency Medicine

## 2022-04-28 ENCOUNTER — Ambulatory Visit
Admission: EM | Admit: 2022-04-28 | Discharge: 2022-04-28 | Disposition: A | Discharge status: Home / Self Care | Payer: PPO | Attending: Nurse Practitioner | Admitting: Nurse Practitioner

## 2022-04-28 DIAGNOSIS — M542 Cervicalgia: Secondary | ICD-10-CM

## 2022-04-28 MED ORDER — TIZANIDINE HCL 4 MG PO TABS: 4.0000 mg | ORAL_TABLET | Freq: Every evening | ORAL | 0 refills | Status: DC | PRN

## 2022-04-28 NOTE — ED Triage Notes (Signed)
Neck pain that comes and goes x 1 month.  Hurts to bend head forward.  Pain x 5 days.

## 2022-04-28 NOTE — ED Provider Notes (Signed)
[facility]    CSN: [id number] Arrival date & time: 04/28/22  7616      History   Chief Complaint No chief complaint on file.   HPI Brandon Buck is a 61 y.o. male.   Patient presents with 1 month of upper neck/back of scalp pain.  He denies any recent accident, fall, or injury that occurred before the pain started.  He noticed the pain when he was standing in his yard.  Now, the pain comes and goes and "grabs me."  Reports sometimes there is no pain, sometimes there is a sharp pain, sometimes a more dull pain.  Denies radiation of pain down his arms or down his back.  Bending forward, moving certain ways makes the pain worse.  Has not tried anything for the pain so far.  He denies any weakness in his arms, numbness or tingling, fevers, vision changes, chest pain, shortness of breath.     Past Medical History:  Diagnosis Date   Anxiety    Arthritis    Asthma    Cancer ([place])    right renal cancer, right radical nephrectomy 2004   Chronic back pain    Chronic hip pain, left    CKD (chronic kidney disease) stage 3, GFR 30-59 ml/min (HCC)    ckd stage 3 b    Complication of anesthesia    1 seizure after anesthesia 1999 or 2000 none since , recent surgeries no seizures   Coronary artery disease    a. s/p prior PCI to RCA and LCx in 2009   Depression    GERD (gastroesophageal reflux disease)    Gout    no recent flares   Heart disease    High blood pressure    History of PTCA 2 2004   2 stents   History of stomach ulcers    Hypercholesteremia    Hypertension    Kidney disease    Myocardial infarction ([place]) 2004   Prostate cancer ([place])    3/12 cores upt to 40% grade 2 cancer. Planning brachytherapy (2021)   Proteinuria    Renal insufficiency    Tennis elbow     Patient Active Problem List   Diagnosis Date Noted   Hematospermia 11/10/2021   Microscopic hematuria 11/10/2021   Abnormal abdominal CT scan 10/02/2020   Malignant neoplasm of prostate ([place])  09/02/2020   Essential hypertension 10/05/2019   Hyperkalemia 10/05/2019   CKD (chronic kidney disease) stage 3, GFR 30-59 ml/min (HCC)    Proteinuria    History of PTCA 2    Gout    Plantar fasciitis 03/01/2012   HIP PAIN 11/11/2008   ASEPTIC NECROSIS 11/11/2008    Past Surgical History:  Procedure Laterality Date   COLONOSCOPY WITH PROPOFOL N/A 10/28/2020   Procedure: COLONOSCOPY WITH PROPOFOL;  Surgeon: Harvel Quale, MD;  [facility];  Service: Gastroenterology;  Laterality: N/A;  2:30   coloscopy  10/28/2020   removed 7 polyps done at [place] N/A 11/03/2020   Procedure: CYSTOSCOPY;  Surgeon: Franchot Gallo, MD;  Location: [facility];  Service: Urology;  Laterality: N/A;   Heart catherization  2004   2 stents to heart cypher left circulflex   HERNIA REPAIR  1999   left groin bilateral   HIP SURGERY Left 1999 or 2000   bond graft   Kidney removed Right 2003 or 2004   for cancer   POLYPECTOMY  10/28/2020   Procedure: POLYPECTOMY  INTESTINAL;  Surgeon: Montez Morita, Quillian Quince, MD;  Location: [facility];  Service: Gastroenterology;;   PROSTATE BIOPSY  2021   RADIOACTIVE SEED IMPLANT N/A 11/03/2020   Procedure: RADIOACTIVE SEED IMPLANT/BRACHYTHERAPY IMPLANT;  Surgeon: Franchot Gallo, MD;  Location: [facility];  Service: Urology;  Laterality: N/A;  National N/A 11/03/2020   Procedure: SPACE OAR INSTILLATION;  Surgeon: Franchot Gallo, MD;  Location: [facility];  Service: Urology;  Laterality: N/A;       Home Medications    Prior to Admission medications   Medication Sig Start Date End Date Taking? Authorizing Provider  tiZANidine (ZANAFLEX) 4 MG tablet Take 1 tablet (4 mg total) by mouth at bedtime as needed for muscle spasms. Do not take with alcohol or while driving or operating heavy machinery 04/28/22  Yes Noemi Chapel A, NP  allopurinol (ZYLOPRIM)  100 MG tablet TAKE 2 TABLETS BY MOUTH DAILY. 01/13/22   [name], MD  ALPRAZolam Duanne Moron) 0.5 MG tablet TAKE (1) TABLET BY MOUTH TWICE DAILY AS NEEDED FOR ANXIETY 04/13/22   [name], MD  aspirin 81 MG tablet Take 81 mg by mouth daily.    [provider]  atorvastatin (LIPITOR) 80 MG tablet TAKE (1) TABLET BY MOUTH ONCE DAILY. 11/18/21   [name], MD  ezetimibe (ZETIA) 10 MG tablet TAKE ONE TABLET BY MOUTH ONCE DAILY. 01/05/22   [name], MD  fluticasone (FLONASE) 50 MCG/ACT nasal spray Place 2 sprays into both nostrils daily. 01/29/19   [name], MD  furosemide (LASIX) 20 MG tablet Take 1 tablet (20 mg total) by mouth daily. 03/27/21   [name], MD  HYDROcodone-acetaminophen (NORCO/VICODIN) 5-325 MG tablet TAKE 1 TABLET BY MOUTH THREE TIMES DAILY AS NEEDED FOR MODERATE PAIN. 04/13/22   [name], MD  lisinopril (ZESTRIL) 20 MG tablet Take 20 mg by mouth daily.    [provider]  metoprolol tartrate (LOPRESSOR) 50 MG tablet Take 1 tablet (50 mg total) by mouth 2 (two) times daily. 12/24/20 03/24/21  Isaiah Serge, NP  NICOTROL 10 MG inhaler INHALE 1 CARTRIDGE INTO THE LUNGS AS NEEDED FOR SMOKING CESSATION. 01/07/22   [name], MD  nitroGLYCERIN (NITROSTAT) 0.4 MG SL tablet Place 1 tablet (0.4 mg total) under the tongue every 5 (five) minutes as needed. 12/24/20   Isaiah Serge, NP  pantoprazole (PROTONIX) 40 MG tablet Take 1 tablet (40 mg total) by mouth 2 (two) times daily. 03/27/21   [name], MD  Phenazopyridine HCl (URISTAT PO) Take by mouth.    [provider]  tamsulosin (FLOMAX) 0.4 MG CAPS capsule Take 1 capsule (0.4 mg total) by mouth daily after supper. 12/09/20   Franchot Gallo, MD  triamcinolone cream (KENALOG) 0.1 % Apply 1 application topically 2 (two) times daily. 05/11/21   Scot Jun, FNP    Family History Family History  Problem Relation Age of Onset   Heart disease Other     Cancer Other    Kidney disease Other    Heart disease Father    Prostate cancer Father    Heart disease Mother    Lung cancer Mother    Kidney cancer Mother    Prostate cancer Maternal Uncle    Bladder Cancer Maternal Grandfather    Breast cancer Neg Hx    Colon cancer Neg Hx    Pancreatic cancer Neg Hx  Social History Social History   Tobacco Use   Smoking status: Former    Packs/day: 1.50    Years: 32.00    Total pack years: 48.00    Types: Cigarettes    Start date: 01/26/1971    Quit date: 01/26/2003    Years since quitting: 19.2   Smokeless tobacco: Never  Vaping Use   Vaping Use: Never used  Substance Use Topics   Alcohol use: Yes    Alcohol/week: 0.0 standard drinks of alcohol    Comment: occ 2 week   Drug use: No     Allergies   Other and Keflex [cephalexin]   Review of Systems Review of Systems Per HPI  Physical Exam Triage Vital Signs ED Triage Vitals  Enc Vitals Group     BP 04/28/22 0853 (!) 180/75     Pulse Rate 04/28/22 0853 64     Resp 04/28/22 0853 18     Temp 04/28/22 0853 98.2 F (36.8 C)     Temp Source 04/28/22 0853 Oral     SpO2 04/28/22 0853 96 %     Weight --      Height --      Head Circumference --      Peak Flow --      Pain Score 04/28/22 0855 7     Pain Loc --      Pain Edu? --      Excl. in [place]? --    No data found.  Updated Vital Signs BP (!) 180/75 (BP Location: Right Arm)   Pulse 64   Temp 98.2 F (36.8 C) (Oral)   Resp 18   SpO2 96%   Visual Acuity Right Eye Distance:   Left Eye Distance:   Bilateral Distance:    Right Eye Near:   Left Eye Near:    Bilateral Near:     Physical Exam Vitals and nursing note reviewed.  Constitutional:      General: He is not in acute distress.    Appearance: Normal appearance. He is not toxic-appearing.  HENT:     Mouth/Throat:     Mouth: Mucous membranes are moist.     Pharynx: Oropharynx is clear.  Eyes:     General: No scleral icterus.    Extraocular  Movements: Extraocular movements intact.     Pupils: Pupils are equal, round, and reactive to light.  Neck:      Comments: Inspection: No swelling, obvious deformity, or redness to the neck, upper back  Palpation: Nontender to palpation, no obvious deformities palpated.  Patient points to the marked area as area of pain ROM: Full ROM to neck, shoulders Strength: 5/5 bilateral upper extremities Neurovascular: neurovascularly intact in left and right upper extremity  Cardiovascular:     Rate and Rhythm: Normal rate and regular rhythm.  Pulmonary:     Effort: Pulmonary effort is normal. No respiratory distress.     Breath sounds: No wheezing, rhonchi or rales.  Musculoskeletal:     Cervical back: Normal range of motion. No rigidity or tenderness.  Lymphadenopathy:     Cervical: No cervical adenopathy.  Skin:    General: Skin is warm and dry.     Capillary Refill: Capillary refill takes less than 2 seconds.     Coloration: Skin is not jaundiced or pale.     Findings: No erythema or rash.  Neurological:     Mental Status: He is alert and oriented to person, place, and time.  Psychiatric:  Behavior: Behavior is cooperative.      UC Treatments / Results  Labs (all labs ordered are listed, but only abnormal results are displayed) Labs Reviewed - No data to display  EKG   Radiology No results found.  Procedures Procedures (including critical care time)  Medications Ordered in UC Medications - No data to display  Initial Impression / Assessment and Plan / UC Course  I have reviewed the triage vital signs and the nursing notes.  Pertinent labs & imaging results that were available during my care of the patient were reviewed by me and considered in my medical decision making (see chart for details).    Patient is a very pleasant, well-appearing 61 year old male presenting for neck pain.  Suspect muscular cause for the neck/scalp pain.  Treat with Tylenol 500 to 650 mg  every 6 hours as needed, muscle relaxant at nighttime.  We also discussed use of ice or heat.  Need to avoid NSAIDs given history of nephrectomy.  Follow-up with primary care provider if symptoms persist despite treatment.  The patient was given the opportunity to ask questions.  All questions answered to their satisfaction.  The patient is in agreement to this plan.   Final Clinical Impressions(s) / UC Diagnoses   Final diagnoses:  Neck pain     Discharge Instructions      - Please take your blood pressure medication when you get home -Start extra strength Tylenol 500 to 650 mg every 6 hours as needed for the pain in the back of your head/upper neck -At nighttime, you can use tizanidine 4 mg to help relax the muscles -You can also use heat and/or ice to help with any inflammation -If symptoms persist despite this treatment, follow-up with your primary care provider    ED Prescriptions     Medication Sig Dispense Auth. Provider   tiZANidine (ZANAFLEX) 4 MG tablet Take 1 tablet (4 mg total) by mouth at bedtime as needed for muscle spasms. Do not take with alcohol or while driving or operating heavy machinery 30 tablet Eulogio Bear, NP      PDMP not reviewed this encounter.   Eulogio Bear, NP 04/28/22 1023

## 2022-04-28 NOTE — Discharge Instructions (Addendum)
-   Please take your blood pressure medication when you get home -Start extra strength Tylenol 500 to 650 mg every 6 hours as needed for the pain in the back of your head/upper neck -At nighttime, you can use tizanidine 4 mg to help relax the muscles -You can also use heat and/or ice to help with any inflammation -If symptoms persist despite this treatment, follow-up with your primary care provider

## 2022-04-30 ENCOUNTER — Emergency Department (HOSPITAL_COMMUNITY)
Admission: EM | Admit: 2022-04-30 | Discharge: 2022-04-30 | Disposition: A | Discharge status: Home / Self Care | Payer: PPO | Attending: Emergency Medicine | Admitting: Emergency Medicine

## 2022-04-30 ENCOUNTER — Encounter (HOSPITAL_COMMUNITY): Payer: Self-pay

## 2022-04-30 ENCOUNTER — Other Ambulatory Visit: Payer: Self-pay

## 2022-04-30 ENCOUNTER — Emergency Department (HOSPITAL_COMMUNITY): Payer: PPO

## 2022-04-30 DIAGNOSIS — M542 Cervicalgia: Secondary | ICD-10-CM

## 2022-04-30 DIAGNOSIS — I10 Essential (primary) hypertension: Secondary | ICD-10-CM | POA: Insufficient documentation

## 2022-04-30 DIAGNOSIS — Z7982 Long term (current) use of aspirin: Secondary | ICD-10-CM | POA: Insufficient documentation

## 2022-04-30 DIAGNOSIS — M5030 Other cervical disc degeneration, unspecified cervical region: Secondary | ICD-10-CM | POA: Insufficient documentation

## 2022-04-30 DIAGNOSIS — Z79899 Other long term (current) drug therapy: Secondary | ICD-10-CM | POA: Insufficient documentation

## 2022-04-30 DIAGNOSIS — M545 Low back pain, unspecified: Secondary | ICD-10-CM | POA: Diagnosis not present

## 2022-04-30 MED ORDER — PREDNISONE 20 MG PO TABS
40.0000 mg | ORAL_TABLET | Freq: Once | ORAL | Status: AC
  Administered 2022-04-30: 40 mg via ORAL
  Filled 2022-04-30: qty 2

## 2022-04-30 MED ORDER — PREDNISONE 10 MG PO TABS: 20.0000 mg | ORAL_TABLET | Freq: Two times a day (BID) | ORAL | 0 refills | Status: DC

## 2022-04-30 MED ORDER — OXYCODONE-ACETAMINOPHEN 5-325 MG PO TABS: 1.0000 | ORAL_TABLET | Freq: Four times a day (QID) | ORAL | 0 refills | Status: DC | PRN

## 2022-04-30 NOTE — ED Provider Notes (Signed)
[facility] EMERGENCY DEPARTMENT Provider Note   CSN: [id number] Arrival date & time: 04/30/22  0110     History  Chief Complaint  Patient presents with   Neck Pain    X 1 month intermittent-constant since last friday    Brandon Buck is a 61 y.o. male.  Patient is a 61 year old male with past medical history of hypertension, gout.  Patient presenting today with complaints of neck pain.  This started approximately 1 month ago in the absence of any specific injury or trauma.  He describes pain to the top of his neck and base of his skull that is worse when he turns his head, yawns, or lays down to sleep.  He denies any numbness or tingling of the extremities.  He denies any headache or visual disturbances.  Pain became worse today while he was driving and presents for evaluation of this.  The history is provided by the patient.       Home Medications Prior to Admission medications   Medication Sig Start Date End Date Taking? Authorizing Provider  allopurinol (ZYLOPRIM) 100 MG tablet TAKE 2 TABLETS BY MOUTH DAILY. 01/13/22   [name], MD  ALPRAZolam Duanne Moron) 0.5 MG tablet TAKE (1) TABLET BY MOUTH TWICE DAILY AS NEEDED FOR ANXIETY 04/13/22   [name], MD  aspirin 81 MG tablet Take 81 mg by mouth daily.    [provider]  atorvastatin (LIPITOR) 80 MG tablet TAKE (1) TABLET BY MOUTH ONCE DAILY. 11/18/21   [name], MD  ezetimibe (ZETIA) 10 MG tablet TAKE ONE TABLET BY MOUTH ONCE DAILY. 01/05/22   [name], MD  fluticasone (FLONASE) 50 MCG/ACT nasal spray Place 2 sprays into both nostrils daily. 01/29/19   [name], MD  furosemide (LASIX) 20 MG tablet Take 1 tablet (20 mg total) by mouth daily. 03/27/21   [name], MD  HYDROcodone-acetaminophen (NORCO/VICODIN) 5-325 MG tablet TAKE 1 TABLET BY MOUTH THREE TIMES DAILY AS NEEDED FOR MODERATE PAIN. 04/13/22   [name], MD  lisinopril (ZESTRIL) 20 MG tablet Take 20 mg by mouth  daily.    [provider]  metoprolol tartrate (LOPRESSOR) 50 MG tablet Take 1 tablet (50 mg total) by mouth 2 (two) times daily. 12/24/20 03/24/21  Isaiah Serge, NP  NICOTROL 10 MG inhaler INHALE 1 CARTRIDGE INTO THE LUNGS AS NEEDED FOR SMOKING CESSATION. 01/07/22   [name], MD  nitroGLYCERIN (NITROSTAT) 0.4 MG SL tablet Place 1 tablet (0.4 mg total) under the tongue every 5 (five) minutes as needed. 12/24/20   Isaiah Serge, NP  pantoprazole (PROTONIX) 40 MG tablet Take 1 tablet (40 mg total) by mouth 2 (two) times daily. 03/27/21   [name], MD  Phenazopyridine HCl (URISTAT PO) Take by mouth.    [provider]  tamsulosin (FLOMAX) 0.4 MG CAPS capsule Take 1 capsule (0.4 mg total) by mouth daily after supper. 12/09/20   Franchot Gallo, MD  tiZANidine (ZANAFLEX) 4 MG tablet Take 1 tablet (4 mg total) by mouth at bedtime as needed for muscle spasms. Do not take with alcohol or while driving or operating heavy machinery 04/28/22   Noemi Chapel A, NP  triamcinolone cream (KENALOG) 0.1 % Apply 1 application topically 2 (two) times daily. 05/11/21   Scot Jun, FNP      Allergies    Other and Keflex [cephalexin]    Review of Systems   Review of Systems  All other systems reviewed and are negative.   Physical Exam Updated Vital Signs BP (!) 176/136 (BP Location: Right Arm)   Pulse 68   Temp 97.8 F (36.6 C) (Oral)   Resp 20   Ht 6' (1.829 m)   Wt 102.1 kg   SpO2 98%   BMI 30.53 kg/m  Physical Exam Vitals and nursing note reviewed.  Constitutional:      General: He is not in acute distress.    Appearance: Normal appearance. He is not ill-appearing.  HENT:     Head: Normocephalic and atraumatic.  Neck:     Comments: There is tenderness to palpation in the upper cervical soft tissues.  There is no bony tenderness or step-off.  He does have pain with range of motion. Pulmonary:     Effort: Pulmonary effort is normal.  Musculoskeletal:      Cervical back: Normal range of motion and neck supple.  Neurological:     General: No focal deficit present.     Mental Status: He is alert and oriented to person, place, and time.     ED Results / Procedures / Treatments   Labs (all labs ordered are listed, but only abnormal results are displayed) Labs Reviewed - No data to display  EKG None  Radiology CT Cervical Spine Wo Contrast  Result Date: 04/30/2022 CLINICAL DATA:  Neck/head pain along the base of the skull. EXAM: CT CERVICAL SPINE WITHOUT CONTRAST TECHNIQUE: Multidetector CT imaging of the cervical spine was performed without intravenous contrast. Multiplanar CT image reconstructions were also generated. RADIATION DOSE REDUCTION: This exam was performed according to the departmental dose-optimization program which includes automated exposure control, adjustment of the mA and/or kV according to patient size and/or use of iterative reconstruction technique. COMPARISON:  None Available. FINDINGS: Alignment: There is straightening of the normal cervical spine lordosis. Skull base and vertebrae: No acute fracture. Chronic and degenerative changes are seen along the tip of the dens. Soft tissues and spinal canal: No prevertebral fluid or swelling. No visible canal hematoma. Disc levels: Mild endplate sclerosis and mild anterior osteophyte formation are seen at the levels of C3-C4, C4-C5 and C5-C6. There is moderate to marked severity narrowing of the anterior atlantoaxial articulation. Mild to moderate severity intervertebral disc space narrowing is seen at C5-C6, with mild intervertebral disc space narrowing noted throughout the remainder of the cervical spine. Mild to moderate severity bilateral multilevel facet joint hypertrophy is noted. Upper chest: Negative. Other: None. IMPRESSION: 1. No acute fracture or subluxation of the cervical spine. 2. Mild to moderate severity multilevel degenerative disc disease and facet joint hypertrophy.  3. Straightening of the normal cervical spine lordosis, which may be secondary to patient positioning or muscle spasm. Electronically Signed   By: Virgina Norfolk M.D.   On: 04/30/2022 03:29    Procedures Procedures    Medications Ordered in ED Medications  predniSONE (DELTASONE) tablet 40 mg (has no administration in time range)    ED Course/ Medical Decision Making/ A&P  Patient presenting with a 1 month history of neck pain as described in the HPI.  He is neurovascularly intact and CT scan shows no evidence for any large disc herniations.  He does have mild to moderate multilevel degenerative disc disease and facet hypertrophy.  Patient will be treated with anti-inflammatory prednisone and pain medicine.  He is to follow-up with primary doctor if not improving in the next week.  Final Clinical Impression(s) / ED Diagnoses Final diagnoses:  None  Rx / DC Orders ED Discharge Orders     None         Veryl Speak, MD 04/30/22 [id number]

## 2022-04-30 NOTE — Discharge Instructions (Signed)
Begin taking prednisone as prescribed.  Begin taking Percocet as prescribed as needed for pain.  Do not take this medication simultaneously with the hydrocodone you have previously been prescribed.  Follow-up with primary doctor if not improving in the next week.

## 2022-04-30 NOTE — ED Triage Notes (Signed)
Pt from home to ED with c/o of neck/head pain located at base of skull/ top of cervical region of neck. Pt says pain started about a month ago, then eased up, then started  back last Friday and has been constant. Pt was seen at urgent care yesterday.

## 2022-04-30 NOTE — ED Notes (Signed)
ED Provider at bedside. 

## 2022-05-03 MED FILL — Oxycodone w/ Acetaminophen Tab 5-325 MG: ORAL | Qty: 6 | Status: AC

## 2022-05-12 ENCOUNTER — Other Ambulatory Visit: Payer: Self-pay | Admitting: Family Medicine

## 2022-05-12 DIAGNOSIS — F411 Generalized anxiety disorder: Secondary | ICD-10-CM

## 2022-06-15 ENCOUNTER — Other Ambulatory Visit: Payer: Self-pay | Admitting: Family Medicine

## 2022-06-15 DIAGNOSIS — F411 Generalized anxiety disorder: Secondary | ICD-10-CM

## 2022-07-14 ENCOUNTER — Other Ambulatory Visit: Payer: Self-pay | Admitting: Family Medicine

## 2022-07-14 DIAGNOSIS — F411 Generalized anxiety disorder: Secondary | ICD-10-CM

## 2022-07-15 ENCOUNTER — Other Ambulatory Visit: Payer: PPO

## 2022-07-15 ENCOUNTER — Telehealth: Payer: Self-pay | Admitting: Family Medicine

## 2022-07-15 DIAGNOSIS — C61 Malignant neoplasm of prostate: Secondary | ICD-10-CM | POA: Diagnosis not present

## 2022-07-15 NOTE — Telephone Encounter (Signed)
Left message to return call on answering machine; need to reschedule Medicare AWV appt.

## 2022-07-15 NOTE — Telephone Encounter (Signed)
Requested medication (s) are due for refill today: yes  Requested medication (s) are on the active medication list: yes  Last refill:  06/15/22  Future visit scheduled:no  Notes to clinic:  Unable to refill per protocol, cannot delegate.      Requested Prescriptions  Pending Prescriptions Disp Refills   ALPRAZolam (XANAX) 0.5 MG tablet [Pharmacy Med Name: ALPRAZOLAM 0.5 MG TABLET] 30 tablet 0    Sig: TAKE (1) TABLET BY MOUTH TWICE DAILY AS NEEDED FOR ANXIETY     Not Delegated - Psychiatry: Anxiolytics/Hypnotics 2 Failed - 07/14/2022  4:29 PM      Failed - This refill cannot be delegated      Failed - Urine Drug Screen completed in last 360 days      Failed - Valid encounter within last 6 months    Recent Outpatient Visits           1 year ago ASCVD (arteriosclerotic cardiovascular disease)   [place] [name], MD   1 year ago Pure hypercholesterolemia   [place] Schaumann, [name], MD   2 years ago Benign essential HTN   [place] [name], MD   3 years ago Acute idiopathic gout, unspecified site   [place] Pickard, [name], MD   3 years ago Acute bacterial rhinosinusitis   [place] Pickard, [name], MD       Future Appointments             In 5 days Franchot Gallo, MD [place] - Patient is not pregnant       HYDROcodone-acetaminophen (NORCO/VICODIN) 5-325 MG tablet [Pharmacy Med Name: HYDROCODONE/APAP 5/'325MG'$ ] 90 tablet 0    Sig: TAKE 1 TABLET BY MOUTH (3) TIMES DAILY AS NEEDED FOR MODERATE PAIN.     Not Delegated - Analgesics:  Opioid Agonist Combinations Failed - 07/14/2022  4:29 PM      Failed - This refill cannot be delegated      Failed - Urine Drug Screen completed in last 360 days      Failed - Valid encounter within last 3 months    Recent Outpatient Visits           1 year ago ASCVD  (arteriosclerotic cardiovascular disease)   [place] [name], MD   1 year ago Pure hypercholesterolemia   [place] [name], MD   2 years ago Benign essential HTN   [place] [name], MD   3 years ago Acute idiopathic gout, unspecified site   [place] Pickard, [name], MD   3 years ago Acute bacterial rhinosinusitis   [place] Pickard, [name], MD       Future Appointments             In 5 days Franchot Gallo, MD [place]

## 2022-07-16 LAB — PSA: Prostate Specific Ag, Serum: 1.1 ng/mL (ref 0.0–4.0)

## 2022-07-19 NOTE — Progress Notes (Signed)
History of Present Illness:   6.29.2021: 61 year old male sent by Dr. Dennard Schaumann for evaluation of elevated PSA.  PSA was 5.3 on 4.8.2021.  In 2017, PSA was 3.10.  He does have a history of renal cell carcinoma and is status post remote right radical nephrectomy.   There is a strong family history of prostate cancer, in his father and grandfather.  Apparently they both died of the illness.    06/03/20:He underwent ultrasound and biopsy of his prostate  Prostate volume 34.7 mL, PSA 5.3, PSAD 0.15.  3/12 cores revealed adenocarcinoma as follows:  Left apex lateral GS 3+3 and 95% of core Right apex medial GS 3+4 and 40% of core Right mid lateral, GS 3+3 in 10% of core     1.10.2022: I-125 brachytherapy, placement of SpaceOAR 9.20.2022: PSA 0.7 3.21.2023: PSA 1.0.  9.26.2023: PSA 1.1.  He is experiencing no significant lower urinary tract symptoms.  Several weeks ago he had foul-smelling urine for a week or 2.  No dysuria, however.  This has resolved. IPSS 12, quality-of-life score 2.  He is on tamsulosin.  No blood in urine or stool.  He does want to try Viagra.  He is on rare doses of nitroglycerin.  Past Medical History:  Diagnosis Date   Anxiety    Arthritis    Asthma    Cancer ([place])    right renal cancer, right radical nephrectomy 2004   Chronic back pain    Chronic hip pain, left    CKD (chronic kidney disease) stage 3, GFR 30-59 ml/min (HCC)    ckd stage 3 b    Complication of anesthesia    1 seizure after anesthesia 1999 or 2000 none since , recent surgeries no seizures   Coronary artery disease    a. s/p prior PCI to RCA and LCx in 2009   Depression    GERD (gastroesophageal reflux disease)    Gout    no recent flares   Heart disease    High blood pressure    History of PTCA 2 2004   2 stents   History of stomach ulcers    Hypercholesteremia    Hypertension    Kidney disease    Myocardial infarction ([place]) 2004   Prostate cancer ([place])    3/12 cores upt to 40%  grade 2 cancer. Planning brachytherapy (2021)   Proteinuria    Renal insufficiency    Tennis elbow     Past Surgical History:  Procedure Laterality Date   COLONOSCOPY WITH PROPOFOL N/A 10/28/2020   Procedure: COLONOSCOPY WITH PROPOFOL;  Surgeon: Harvel Quale, MD;  [facility];  Service: Gastroenterology;  Laterality: N/A;  2:30   coloscopy  10/28/2020   removed 7 polyps done at [facility] N/A 11/03/2020   Procedure: CYSTOSCOPY;  Surgeon: Franchot Gallo, MD;  Location: [facility];  Service: Urology;  Laterality: N/A;   Heart catherization  2004   2 stents to heart cypher left circulflex   HERNIA REPAIR  1999   left groin bilateral   HIP SURGERY Left 1999 or 2000   bond graft   Kidney removed Right 2003 or 2004   for cancer   POLYPECTOMY  10/28/2020   Procedure: POLYPECTOMY INTESTINAL;  Surgeon: Harvel Quale, MD;  [facility];  Service: Gastroenterology;;   PROSTATE BIOPSY  2021   RADIOACTIVE SEED IMPLANT N/A 11/03/2020   Procedure: RADIOACTIVE SEED IMPLANT/BRACHYTHERAPY IMPLANT;  Surgeon: Franchot Gallo, MD;  Location: [place];  Service: Urology;  Laterality: N/A;  [place] N/A 11/03/2020   Procedure: SPACE OAR INSTILLATION;  Surgeon: Franchot Gallo, MD;  Location: [facility];  Service: Urology;  Laterality: N/A;    Home Medications:  Allergies as of 07/20/2022       Reactions   Other    Pt. Reports having seizures after anesthesia once 1999 or 2000 none since   Keflex [cephalexin] Rash        Medication List        Accurate as of July 19, 2022  7:54 PM. If you have any questions, ask your nurse or doctor.          allopurinol 100 MG tablet Commonly known as: ZYLOPRIM TAKE 2 TABLETS BY MOUTH DAILY.   ALPRAZolam 0.5 MG tablet Commonly known as: XANAX TAKE (1) TABLET BY MOUTH TWICE DAILY AS NEEDED FOR ANXIETY    aspirin 81 MG tablet Take 81 mg by mouth daily.   atorvastatin 80 MG tablet Commonly known as: LIPITOR TAKE (1) TABLET BY MOUTH ONCE DAILY.   ezetimibe 10 MG tablet Commonly known as: ZETIA TAKE ONE TABLET BY MOUTH ONCE DAILY.   fluticasone 50 MCG/ACT nasal spray Commonly known as: FLONASE Place 2 sprays into both nostrils daily.   furosemide 20 MG tablet Commonly known as: LASIX Take 1 tablet (20 mg total) by mouth daily.   HYDROcodone-acetaminophen 5-325 MG tablet Commonly known as: NORCO/VICODIN TAKE 1 TABLET BY MOUTH (3) TIMES DAILY AS NEEDED FOR MODERATE PAIN.   lisinopril 20 MG tablet Commonly known as: ZESTRIL Take 20 mg by mouth daily.   metoprolol tartrate 50 MG tablet Commonly known as: LOPRESSOR Take 1 tablet (50 mg total) by mouth 2 (two) times daily.   Nicotrol 10 MG inhaler Generic drug: nicotine INHALE 1 CARTRIDGE INTO THE LUNGS AS NEEDED FOR SMOKING CESSATION.   nitroGLYCERIN 0.4 MG SL tablet Commonly known as: NITROSTAT Place 1 tablet (0.4 mg total) under the tongue every 5 (five) minutes as needed.   oxyCODONE-acetaminophen 5-325 MG tablet Commonly known as: PERCOCET/ROXICET Take 1-2 tablets by mouth every 6 (six) hours as needed for severe pain.   oxyCODONE-acetaminophen 5-325 MG tablet Commonly known as: PERCOCET/ROXICET Take 1 tablet by mouth every 6 (six) hours as needed for severe pain.   pantoprazole 40 MG tablet Commonly known as: PROTONIX Take 1 tablet (40 mg total) by mouth 2 (two) times daily.   predniSONE 10 MG tablet Commonly known as: DELTASONE Take 2 tablets (20 mg total) by mouth 2 (two) times daily.   tamsulosin 0.4 MG Caps capsule Commonly known as: FLOMAX Take 1 capsule (0.4 mg total) by mouth daily after supper.   tiZANidine 4 MG tablet Commonly known as: Zanaflex Take 1 tablet (4 mg total) by mouth at bedtime as needed for muscle spasms. Do not take with alcohol or while driving or operating heavy machinery    triamcinolone cream 0.1 % Commonly known as: KENALOG Apply 1 application topically 2 (two) times daily.   URISTAT PO Take by mouth.        Allergies:  Allergies  Allergen Reactions   Other     Pt. Reports having seizures after anesthesia once 1999 or 2000 none since   Keflex [Cephalexin] Rash    Family History  Problem Relation Age of Onset   Heart disease Other    Cancer Other    Kidney disease Other    Heart  disease Father    Prostate cancer Father    Heart disease Mother    Lung cancer Mother    Kidney cancer Mother    Prostate cancer Maternal Uncle    Bladder Cancer Maternal Grandfather    Breast cancer Neg Hx    Colon cancer Neg Hx    Pancreatic cancer Neg Hx     Social History:  reports that he quit smoking about 19 years ago. His smoking use included cigarettes. He started smoking about 51 years ago. He has a 48.00 pack-year smoking history. He has never used smokeless tobacco. He reports current alcohol use. He reports that he does not use drugs.  ROS: A complete review of systems was performed.  All systems are negative except for pertinent findings as noted.  Physical Exam:  Vital signs in last 24 hours: There were no vitals taken for this visit. Constitutional:  Alert and oriented, No acute distress Cardiovascular: Regular rate  Respiratory: Normal respiratory effort Genitourinary: Normal anal sphincter tone.  Prostate smooth, flat. Neurologic: Grossly intact, no focal deficits Psychiatric: Normal mood and affect  I have reviewed prior pt notes  I have reviewed notes from referring/previous physicians--ER visits reviewed-1 4 neck pain, 1 for diverticulitis.  I have reviewed urinalysis results--clear  I have independently reviewed prior imaging-ultrasound results  I have reviewed prior PSA and pathology results     Impression/Assessment:  1.  Erectile dysfunction following radiotherapy.  He wants to try medical therapy  2.  History of  grade group 2 prostate cancer, status post brachytherapy in early 2022.  Stable PSA, down from 5.3 to 1.1, benign exam today  3.  BPH, doing well on tamsulosin  Plan:  1.  Prescription for sildenafil sent in.  Warnings not to take this with nitroglycerin  2.  I will see him back in 6 months following PSA

## 2022-07-20 ENCOUNTER — Encounter: Payer: Self-pay | Admitting: Urology

## 2022-07-20 ENCOUNTER — Ambulatory Visit: Payer: PPO | Admitting: Urology

## 2022-07-20 VITALS — BP 177/88 | HR 76 | Ht 72.0 in | Wt 213.0 lb

## 2022-07-20 DIAGNOSIS — N401 Enlarged prostate with lower urinary tract symptoms: Secondary | ICD-10-CM

## 2022-07-20 DIAGNOSIS — Z8546 Personal history of malignant neoplasm of prostate: Secondary | ICD-10-CM | POA: Diagnosis not present

## 2022-07-20 DIAGNOSIS — N5235 Erectile dysfunction following radiation therapy: Secondary | ICD-10-CM

## 2022-07-20 DIAGNOSIS — N138 Other obstructive and reflux uropathy: Secondary | ICD-10-CM | POA: Diagnosis not present

## 2022-07-20 DIAGNOSIS — N5201 Erectile dysfunction due to arterial insufficiency: Secondary | ICD-10-CM

## 2022-07-20 DIAGNOSIS — C61 Malignant neoplasm of prostate: Secondary | ICD-10-CM

## 2022-07-20 MED ORDER — SILDENAFIL CITRATE 100 MG PO TABS: 100.0000 mg | ORAL_TABLET | ORAL | 99 refills | Status: DC | PRN

## 2022-07-21 LAB — URINALYSIS, ROUTINE W REFLEX MICROSCOPIC
Bilirubin, UA: NEGATIVE
Glucose, UA: NEGATIVE
Leukocytes,UA: NEGATIVE
Nitrite, UA: NEGATIVE
Specific Gravity, UA: 1.02 (ref 1.005–1.030)
Urobilinogen, Ur: 0.2 mg/dL (ref 0.2–1.0)
pH, UA: 5.5 (ref 5.0–7.5)

## 2022-07-21 LAB — MICROSCOPIC EXAMINATION: Bacteria, UA: NONE SEEN

## 2022-07-28 NOTE — Progress Notes (Deleted)
Subjective:   Brandon Buck is a 61 y.o. male who presents for Medicare Annual/Subsequent preventive examination.  Review of Systems    ***       Objective:    There were no vitals filed for this visit. There is no height or weight on file to calculate BMI.     04/30/2022    2:09 AM 02/19/2022    2:27 PM 07/24/2021   11:28 AM 11/20/2020    1:38 PM 11/03/2020    8:55 AM 10/28/2020    6:43 AM 09/02/2020    2:23 PM  Advanced Directives  Does Patient Have a Medical Advance Directive? No No No No No No No  Would patient like information on creating a medical advance directive? No - Patient declined  No - Patient declined No - Patient declined No - Patient declined No - Patient declined No - Patient declined    Current Medications (verified) Outpatient Encounter Medications as of 07/28/2022  Medication Sig   allopurinol (ZYLOPRIM) 100 MG tablet TAKE 2 TABLETS BY MOUTH DAILY.   ALPRAZolam (XANAX) 0.5 MG tablet TAKE (1) TABLET BY MOUTH TWICE DAILY AS NEEDED FOR ANXIETY   aspirin 81 MG tablet Take 81 mg by mouth daily.   atorvastatin (LIPITOR) 80 MG tablet TAKE (1) TABLET BY MOUTH ONCE DAILY.   ezetimibe (ZETIA) 10 MG tablet TAKE ONE TABLET BY MOUTH ONCE DAILY.   fluticasone (FLONASE) 50 MCG/ACT nasal spray Place 2 sprays into both nostrils daily.   furosemide (LASIX) 20 MG tablet Take 1 tablet (20 mg total) by mouth daily.   HYDROcodone-acetaminophen (NORCO/VICODIN) 5-325 MG tablet TAKE 1 TABLET BY MOUTH (3) TIMES DAILY AS NEEDED FOR MODERATE PAIN.   lisinopril (ZESTRIL) 20 MG tablet Take 20 mg by mouth daily.   metoprolol tartrate (LOPRESSOR) 50 MG tablet Take 1 tablet (50 mg total) by mouth 2 (two) times daily.   NICOTROL 10 MG inhaler INHALE 1 CARTRIDGE INTO THE LUNGS AS NEEDED FOR SMOKING CESSATION.   nitroGLYCERIN (NITROSTAT) 0.4 MG SL tablet Place 1 tablet (0.4 mg total) under the tongue every 5 (five) minutes as needed.   oxyCODONE-acetaminophen (PERCOCET/ROXICET) 5-325 MG tablet  Take 1-2 tablets by mouth every 6 (six) hours as needed for severe pain.   oxyCODONE-acetaminophen (PERCOCET/ROXICET) 5-325 MG tablet Take 1 tablet by mouth every 6 (six) hours as needed for severe pain.   pantoprazole (PROTONIX) 40 MG tablet Take 1 tablet (40 mg total) by mouth 2 (two) times daily.   Phenazopyridine HCl (URISTAT PO) Take by mouth.   predniSONE (DELTASONE) 10 MG tablet Take 2 tablets (20 mg total) by mouth 2 (two) times daily.   sildenafil (VIAGRA) 100 MG tablet Take 1 tablet (100 mg total) by mouth as needed for erectile dysfunction.   tamsulosin (FLOMAX) 0.4 MG CAPS capsule Take 1 capsule (0.4 mg total) by mouth daily after supper.   tiZANidine (ZANAFLEX) 4 MG tablet Take 1 tablet (4 mg total) by mouth at bedtime as needed for muscle spasms. Do not take with alcohol or while driving or operating heavy machinery   triamcinolone cream (KENALOG) 0.1 % Apply 1 application topically 2 (two) times daily.   No facility-administered encounter medications on file as of 07/28/2022.    Allergies (verified) Other and Keflex [cephalexin]   History: Past Medical History:  Diagnosis Date   Anxiety    Arthritis    Asthma    Cancer ([place])    right renal cancer, right radical nephrectomy 2004  Chronic back pain    Chronic hip pain, left    CKD (chronic kidney disease) stage 3, GFR 30-59 ml/min (HCC)    ckd stage 3 b    Complication of anesthesia    1 seizure after anesthesia 1999 or 2000 none since , recent surgeries no seizures   Coronary artery disease    a. s/p prior PCI to RCA and LCx in 2009   Depression    GERD (gastroesophageal reflux disease)    Gout    no recent flares   Heart disease    High blood pressure    History of PTCA 2 2004   2 stents   History of stomach ulcers    Hypercholesteremia    Hypertension    Kidney disease    Myocardial infarction [facility]) 2004   Prostate cancer ([place])    3/12 cores upt to 40% grade 2 cancer. Planning brachytherapy (2021)    Proteinuria    Renal insufficiency    Tennis elbow    Past Surgical History:  Procedure Laterality Date   COLONOSCOPY WITH PROPOFOL N/A 10/28/2020   Procedure: COLONOSCOPY WITH PROPOFOL;  Surgeon: Harvel Quale, MD;  [facility];  Service: Gastroenterology;  Laterality: N/A;  2:30   coloscopy  10/28/2020   removed 7 polyps done at [place] N/A 11/03/2020   Procedure: CYSTOSCOPY;  Surgeon: Franchot Gallo, MD;  [facility];  Service: Urology;  Laterality: N/A;   Heart catherization  2004   2 stents to heart cypher left circulflex   HERNIA REPAIR  1999   left groin bilateral   HIP SURGERY Left 1999 or 2000   bond graft   Kidney removed Right 2003 or 2004   for cancer   POLYPECTOMY  10/28/2020   Procedure: POLYPECTOMY INTESTINAL;  Surgeon: Harvel Quale, MD;  [facility];  Service: Gastroenterology;;   PROSTATE BIOPSY  2021   RADIOACTIVE SEED IMPLANT N/A 11/03/2020   Procedure: RADIOACTIVE SEED IMPLANT/BRACHYTHERAPY IMPLANT;  Surgeon: Franchot Gallo, MD;  Location[facility];  Service: Urology;  Laterality: N/A;  [place] N/A 11/03/2020   Procedure: SPACE OAR INSTILLATION;  Surgeon: Franchot Gallo, MD;  Location: [facility];  Service: Urology;  Laterality: N/A;   Family History  Problem Relation Age of Onset   Heart disease Other    Cancer Other    Kidney disease Other    Heart disease Father    Prostate cancer Father    Heart disease Mother    Lung cancer Mother    Kidney cancer Mother    Prostate cancer Maternal Uncle    Bladder Cancer Maternal Grandfather    Breast cancer Neg Hx    Colon cancer Neg Hx    Pancreatic cancer Neg Hx    Social History   Socioeconomic History   Marital status: Married    Spouse name: Sheree   Number of children: 0   Years of education: Not on file   Highest education level: Not on file   Occupational History   Occupation: retired  Tobacco Use   Smoking status: Former    Packs/day: 1.50    Years: 32.00    Total pack years: 48.00    Types: Cigarettes    Start date: 01/26/1971    Quit date: 01/26/2003    Years since quitting: 19.5   Smokeless tobacco: Never  Vaping Use   Vaping Use:  Never used  Substance and Sexual Activity   Alcohol use: Yes    Alcohol/week: 0.0 standard drinks of alcohol    Comment: occ 2 week   Drug use: No   Sexual activity: Yes  Other Topics Concern   Not on file  Social History Narrative   Worked as an Clinical biochemist.    Married x 45 years in 2022.   Social Determinants of Health   Financial Resource Strain: Low Risk  (07/24/2021)   Overall Financial Resource Strain (CARDIA)    Difficulty of Paying Living Expenses: Not hard at all  Food Insecurity: No Food Insecurity (07/24/2021)   Hunger Vital Sign    Worried About Running Out of Food in the Last Year: Never true    Ran Out of Food in the Last Year: Never true  Transportation Needs: No Transportation Needs (07/24/2021)   PRAPARE - Hydrologist (Medical): No    Lack of Transportation (Non-Medical): No  Physical Activity: Sufficiently Active (07/24/2021)   Exercise Vital Sign    Days of Exercise per Week: 5 days    Minutes of Exercise per Session: 30 min  Stress: No Stress Concern Present (07/24/2021)   [place]    Feeling of Stress : Not at all  Social Connections: [place] (07/24/2021)   Social Connection and Isolation Panel [NHANES]    Frequency of Communication with Friends and Family: More than three times a week    Frequency of Social Gatherings with Friends and Family: Three times a week    Attends Religious Services: 1 to 4 times per year    Active Member of Clubs or Organizations: No    Attends Archivist Meetings: 1 to 4 times per year    Marital Status: Married     Tobacco Counseling Counseling given: Not Answered   Clinical Intake:                 Diabetic?***         Activities of Daily Living     No data to display          Patient Care Team: [name], MD as PCP - General (Family Medicine) Edythe Clarity, [facility] as Pharmacist (Pharmacist)  Indicate any recent Medical Services you may have received from other than [facility] providers in the past year (date may be approximate).     Assessment:   This is a routine wellness examination for [facility].  Hearing/Vision screen No results found.  Dietary issues and exercise activities discussed:     Goals Addressed   None    Depression Screen    07/24/2021   11:22 AM 03/27/2021   12:15 PM 01/31/2020    8:09 AM 01/23/2018   10:42 AM  PHQ 2/9 Scores  PHQ - 2 Score 0 0 0 0    Fall Risk    07/24/2021   11:28 AM 03/27/2021   12:15 PM  Fall Risk   Falls in the past year? 0 0  Number falls in past yr: 0 0  Injury with Fall? 0 0  Risk for fall due to : Impaired vision No Fall Risks  Follow up Falls prevention discussed Falls evaluation completed    FALL RISK PREVENTION PERTAINING TO THE HOME:  Any stairs in or around the home? {YES/NO:21197} If so, are there any without handrails? {YES/NO:21197} Home free of loose throw rugs in walkways, pet beds, electrical cords,  etc? {YES/NO:21197} Adequate lighting in your home to reduce risk of falls? {YES/NO:21197}  ASSISTIVE DEVICES UTILIZED TO PREVENT FALLS:  Life alert? {YES/NO:21197} Use of a cane, walker or w/c? {YES/NO:21197} Grab bars in the bathroom? {YES/NO:21197} Shower chair or bench in shower? {YES/NO:21197} Elevated toilet seat or a handicapped toilet? {YES/NO:21197}  TIMED UP AND GO:  Was the test performed? {YES/NO:21197}.  Length of time to ambulate 10 feet: *** sec.   {Appearance of MVHQ:4696295}  Cognitive Function:        07/24/2021   11:33 AM  6CIT Screen  What Year? 0 points  What  month? 0 points  What time? 0 points  Count back from 20 0 points  Months in reverse 0 points  Repeat phrase 0 points  Total Score 0 points    Immunizations Immunization History  Administered Date(s) Administered   Influenza Inj Mdck Quad With Preservative 07/25/2018   Influenza,inj,Quad PF,6+ Mos 08/06/2014   Influenza,inj,quad, With Preservative 07/06/2019   Influenza-Unspecified 08/01/2015, 07/06/2019, 09/19/2020, 08/13/2021   Moderna Sars-Covid-2 Vaccination 01/05/2020, 02/05/2020, 08/22/2020   Pneumococcal Polysaccharide-23 06/25/2009, 01/23/2018    {TDAP status:2101805}  {Flu Vaccine status:2101806}  {Pneumococcal vaccine status:2101807}  {Covid-19 vaccine status:2101808}  Qualifies for Shingles Vaccine? {YES/NO:21197}  Zostavax completed {YES/NO:21197}  {Shingrix Completed?:2101804}  Screening Tests Health Maintenance  Topic Date Due   TETANUS/TDAP  Never done   Zoster Vaccines- Shingrix (1 of 2) Never done   COVID-19 Vaccine (4 - Moderna risk series) 10/17/2020   INFLUENZA VACCINE  05/25/2022   Fecal DNA (Cologuard)  02/11/2023   Hepatitis C Screening  Completed   HPV VACCINES  Aged Out   COLONOSCOPY (Pts 45-2yr Insurance coverage will need to be confirmed)  Discontinued   HIV Screening  Discontinued    Health Maintenance  Health Maintenance Due  Topic Date Due   TETANUS/TDAP  Never done   Zoster Vaccines- Shingrix (1 of 2) Never done   COVID-19 Vaccine (4 - Moderna risk series) 10/17/2020   INFLUENZA VACCINE  05/25/2022    {Colorectal cancer screening:2101809}  Lung Cancer Screening: (Low Dose CT Chest recommended if Age 62-80years, 30 pack-year currently smoking OR have quit w/in 15years.) {DOES NOT does:27190::"does not"} qualify.   Lung Cancer Screening Referral: ***  Additional Screening:  Hepatitis C Screening: {DOES NOT does:27190::"does not"} qualify; Completed ***  Vision Screening: Recommended annual ophthalmology exams for early  detection of glaucoma and other disorders of the eye. Is the patient up to date with their annual eye exam?  {YES/NO:21197} Who is the provider or what is the name of the office in which the patient attends annual eye exams? *** If pt is not established with a provider, would they like to be referred to a provider to establish care? {YES/NO:21197}.   Dental Screening: Recommended annual dental exams for proper oral hygiene  Community Resource Referral / Chronic Care Management: CRR required this visit?  {YES/NO:21197}  CCM required this visit?  {YES/NO:21197}     Plan:     I have personally reviewed and noted the following in the patient's chart:   Medical and social history Use of alcohol, tobacco or illicit drugs  Current medications and supplements including opioid prescriptions. {Opioid Prescriptions:[phone]} Functional ability and status Nutritional status Physical activity Advanced directives List of other physicians Hospitalizations, surgeries, and ER visits in previous 12 months Vitals Screenings to include cognitive, depression, and falls Referrals and appointments  In addition, I have reviewed and discussed with patient certain preventive protocols, quality metrics, and  best practice recommendations. A written personalized care plan for preventive services as well as general preventive health recommendations were provided to patient.     Chriss Driver, LPN   [id number]/02/[id number]   Nurse Notes: ***

## 2022-08-09 ENCOUNTER — Other Ambulatory Visit: Payer: Self-pay | Admitting: Family Medicine

## 2022-08-09 DIAGNOSIS — F411 Generalized anxiety disorder: Secondary | ICD-10-CM

## 2022-09-07 ENCOUNTER — Telehealth: Payer: Self-pay | Admitting: Pharmacist

## 2022-09-07 NOTE — Progress Notes (Unsigned)
Chronic Care Management Pharmacy Assistant   Name: Brandon Buck  MRN: [id number] DOB: January 30, 1961   Reason for Encounter: General Adherence Call    Recent office visits:  04/28/2022 Urgent Care for Neck Pain  -Start extra strength Tylenol 500 to 650 mg every 6 hours as needed for the pain in the back of your head/upper neck  -At nighttime, you can use tizanidine 4 mg to help relax the muscles   Recent consult visits:  07/20/2022 OV (Urology) Franchot Gallo, MD; no medication changes.  Hospital visits:  04/30/2022 ED visit for Neck pain - Patient will be treated with anti-inflammatory prednisone and pain medicine   Medications: Outpatient Encounter Medications as of 09/07/2022  Medication Sig Note   allopurinol (ZYLOPRIM) 100 MG tablet TAKE 2 TABLETS BY MOUTH DAILY.    ALPRAZolam (XANAX) 0.5 MG tablet TAKE (1) TABLET BY MOUTH TWICE DAILY AS NEEDED FOR ANXIETY    aspirin 81 MG tablet Take 81 mg by mouth daily.    atorvastatin (LIPITOR) 80 MG tablet TAKE (1) TABLET BY MOUTH ONCE DAILY.    ezetimibe (ZETIA) 10 MG tablet TAKE ONE TABLET BY MOUTH ONCE DAILY.    fluticasone (FLONASE) 50 MCG/ACT nasal spray Place 2 sprays into both nostrils daily. 10/02/2020: Per patient as needed.   furosemide (LASIX) 20 MG tablet Take 1 tablet (20 mg total) by mouth daily.    HYDROcodone-acetaminophen (NORCO/VICODIN) 5-325 MG tablet TAKE 1 TABLET BY MOUTH (3) TIMES DAILY AS NEEDED FOR MODERATE PAIN.    lisinopril (ZESTRIL) 20 MG tablet Take 20 mg by mouth daily.    metoprolol tartrate (LOPRESSOR) 50 MG tablet Take 1 tablet (50 mg total) by mouth 2 (two) times daily.    NICOTROL 10 MG inhaler INHALE 1 CARTRIDGE INTO THE LUNGS AS NEEDED FOR SMOKING CESSATION.    nitroGLYCERIN (NITROSTAT) 0.4 MG SL tablet Place 1 tablet (0.4 mg total) under the tongue every 5 (five) minutes as needed.    oxyCODONE-acetaminophen (PERCOCET/ROXICET) 5-325 MG tablet Take 1-2 tablets by mouth every 6 (six) hours as needed  for severe pain.    oxyCODONE-acetaminophen (PERCOCET/ROXICET) 5-325 MG tablet Take 1 tablet by mouth every 6 (six) hours as needed for severe pain.    pantoprazole (PROTONIX) 40 MG tablet Take 1 tablet (40 mg total) by mouth 2 (two) times daily.    Phenazopyridine HCl (URISTAT PO) Take by mouth.    predniSONE (DELTASONE) 10 MG tablet Take 2 tablets (20 mg total) by mouth 2 (two) times daily.    sildenafil (VIAGRA) 100 MG tablet Take 1 tablet (100 mg total) by mouth as needed for erectile dysfunction.    tamsulosin (FLOMAX) 0.4 MG CAPS capsule Take 1 capsule (0.4 mg total) by mouth daily after supper.    tiZANidine (ZANAFLEX) 4 MG tablet Take 1 tablet (4 mg total) by mouth at bedtime as needed for muscle spasms. Do not take with alcohol or while driving or operating heavy machinery    triamcinolone cream (KENALOG) 0.1 % Apply 1 application topically 2 (two) times daily.    No facility-administered encounter medications on file as of 09/07/2022.   Contacted Despina Arias for General Review Call   Chart Review:  Have there been any documented new, changed, or discontinued medications since last visit? {yes/no:20286} (If yes, include name, dose, frequency, date) Has there been any documented recent hospitalizations or ED visits since last visit with Clinical Pharmacist? {yes/no:20286} Brief Summary (including medication and/or Diagnosis changes):   Adherence Review:  Does the Clinical Pharmacist Assistant have access to adherence rates? {yes/no:20286} Adherence rates for STAR metric medications (List medication(s)/day supply/ last 2 fill dates). Adherence rates for medications indicated for disease state being reviewed (List medication(s)/day supply/ last 2 fill dates). Does the patient have >5 day gap between last estimated fill dates for any of the above medications or other medication gaps? {yes/no:20286} Reason for medication gaps.   Disease State Questions:  Able to connect with  Patient? {yes/no:20286} Did patient have any problems with their health recently? {yes/no:20286} Note problems and Concerns: Have you had any admissions or emergency room visits or worsening of your condition(s) since last visit? {yes/no:20286} Details of ED visit, hospital visit and/or worsening condition(s): Have you had any visits with new specialists or providers since your last visit? {yes/no:20286} Explain: Have you had any new health care problem(s) since your last visit? {yes/no:20286} New problem(s) reported: Have you run out of any of your medications since you last spoke with clinical pharmacist? {yes/no:20286} What caused you to run out of your medications? Are there any medications you are not taking as prescribed? {yes/no:20286} What kept you from taking your medications as prescribed? Are you having any issues or side effects with your medications? {yes/no:20286} Note of issues or side effects: Do you have any other health concerns or questions you want to discuss with your Clinical Pharmacist before your next visit? {yes/no:20286} Note additional concerns and questions from Patient. Are there any health concerns that you feel we can do a better job addressing? {yes/no:20286} Note Patient's response. Are you having any problems with any of the following since the last visit: (select all that apply)  {General Call:27390}  Details: 12. Any falls since last visit? {yes/no:20286}  Details: 13. Any increased or uncontrolled pain since last visit? {yes/no:20286}  Details: 14. Next visit Type: {Telephone/Office:25179}       Visit with:        Date:        Time:  42. Additional Details? {yes/no:20286}    Care Gaps: Medicare Annual Wellness: Overdue since 07/24/2022 Hemoglobin A1C: none available Colonoscopy: Completed 11/07/2020  Future Appointments  Date Time Provider [place]  01/18/2023  9:45 AM Franchot Gallo, MD [facility] None   Star Rating  Drugs: Atorvastatin 80 mg last filled 11/18/2021 90 DS Lisinopril 20 mg last filled 02/09/2022 90 DS  April D Calhoun, [place] Pharmacist Assistant [phone]

## 2022-09-13 ENCOUNTER — Other Ambulatory Visit: Payer: Self-pay | Admitting: Family Medicine

## 2022-09-13 DIAGNOSIS — F411 Generalized anxiety disorder: Secondary | ICD-10-CM

## 2022-09-14 ENCOUNTER — Other Ambulatory Visit: Payer: Self-pay | Admitting: Family Medicine

## 2022-09-15 ENCOUNTER — Telehealth: Payer: Self-pay | Admitting: Family Medicine

## 2022-09-15 NOTE — Telephone Encounter (Signed)
Left message for patient to call back and schedule Medicare Annual Wellness Visit (AWV) in office.   If not able to come in office, please offer to do virtually or by telephone.   Last AWV:07/24/2021    Please schedule at anytime with [facility]  If any questions, please contact me at [phone].  Thank you ,  Colletta [place]

## 2022-09-28 ENCOUNTER — Other Ambulatory Visit: Payer: Self-pay

## 2022-09-28 ENCOUNTER — Inpatient Hospital Stay (HOSPITAL_COMMUNITY)
Admission: EM | Admit: 2022-09-28 | Discharge: 2022-09-30 | DRG: 287 | Disposition: A | Discharge status: Home / Self Care | Payer: PPO | Attending: Internal Medicine | Admitting: Internal Medicine

## 2022-09-28 ENCOUNTER — Emergency Department (HOSPITAL_COMMUNITY): Payer: PPO

## 2022-09-28 ENCOUNTER — Encounter (HOSPITAL_COMMUNITY): Payer: Self-pay | Admitting: Emergency Medicine

## 2022-09-28 ENCOUNTER — Encounter (HOSPITAL_COMMUNITY): Payer: Self-pay

## 2022-09-28 DIAGNOSIS — Z7982 Long term (current) use of aspirin: Secondary | ICD-10-CM | POA: Diagnosis not present

## 2022-09-28 DIAGNOSIS — I161 Hypertensive emergency: Secondary | ICD-10-CM | POA: Diagnosis present

## 2022-09-28 DIAGNOSIS — I129 Hypertensive chronic kidney disease with stage 1 through stage 4 chronic kidney disease, or unspecified chronic kidney disease: Secondary | ICD-10-CM | POA: Diagnosis present

## 2022-09-28 DIAGNOSIS — J4489 Other specified chronic obstructive pulmonary disease: Secondary | ICD-10-CM | POA: Diagnosis present

## 2022-09-28 DIAGNOSIS — Z8052 Family history of malignant neoplasm of bladder: Secondary | ICD-10-CM | POA: Diagnosis not present

## 2022-09-28 DIAGNOSIS — Z79899 Other long term (current) drug therapy: Secondary | ICD-10-CM | POA: Diagnosis not present

## 2022-09-28 DIAGNOSIS — I252 Old myocardial infarction: Secondary | ICD-10-CM | POA: Diagnosis not present

## 2022-09-28 DIAGNOSIS — K219 Gastro-esophageal reflux disease without esophagitis: Secondary | ICD-10-CM | POA: Diagnosis present

## 2022-09-28 DIAGNOSIS — Z9861 Coronary angioplasty status: Secondary | ICD-10-CM | POA: Diagnosis not present

## 2022-09-28 DIAGNOSIS — Z8051 Family history of malignant neoplasm of kidney: Secondary | ICD-10-CM | POA: Diagnosis not present

## 2022-09-28 DIAGNOSIS — Z85528 Personal history of other malignant neoplasm of kidney: Secondary | ICD-10-CM | POA: Diagnosis not present

## 2022-09-28 DIAGNOSIS — I2510 Atherosclerotic heart disease of native coronary artery without angina pectoris: Secondary | ICD-10-CM

## 2022-09-28 DIAGNOSIS — E782 Mixed hyperlipidemia: Secondary | ICD-10-CM | POA: Diagnosis not present

## 2022-09-28 DIAGNOSIS — N1832 Chronic kidney disease, stage 3b: Secondary | ICD-10-CM | POA: Diagnosis present

## 2022-09-28 DIAGNOSIS — Z7952 Long term (current) use of systemic steroids: Secondary | ICD-10-CM

## 2022-09-28 DIAGNOSIS — Z87891 Personal history of nicotine dependence: Secondary | ICD-10-CM

## 2022-09-28 DIAGNOSIS — Z801 Family history of malignant neoplasm of trachea, bronchus and lung: Secondary | ICD-10-CM

## 2022-09-28 DIAGNOSIS — E875 Hyperkalemia: Secondary | ICD-10-CM | POA: Diagnosis present

## 2022-09-28 DIAGNOSIS — Z8546 Personal history of malignant neoplasm of prostate: Secondary | ICD-10-CM

## 2022-09-28 DIAGNOSIS — E7849 Other hyperlipidemia: Secondary | ICD-10-CM | POA: Diagnosis present

## 2022-09-28 DIAGNOSIS — Z955 Presence of coronary angioplasty implant and graft: Secondary | ICD-10-CM

## 2022-09-28 DIAGNOSIS — Z8042 Family history of malignant neoplasm of prostate: Secondary | ICD-10-CM | POA: Diagnosis not present

## 2022-09-28 DIAGNOSIS — I160 Hypertensive urgency: Secondary | ICD-10-CM | POA: Diagnosis not present

## 2022-09-28 DIAGNOSIS — I10 Essential (primary) hypertension: Secondary | ICD-10-CM | POA: Diagnosis present

## 2022-09-28 DIAGNOSIS — I200 Unstable angina: Secondary | ICD-10-CM | POA: Diagnosis present

## 2022-09-28 DIAGNOSIS — Z8249 Family history of ischemic heart disease and other diseases of the circulatory system: Secondary | ICD-10-CM | POA: Diagnosis not present

## 2022-09-28 DIAGNOSIS — Z905 Acquired absence of kidney: Secondary | ICD-10-CM | POA: Diagnosis not present

## 2022-09-28 DIAGNOSIS — I25110 Atherosclerotic heart disease of native coronary artery with unstable angina pectoris: Secondary | ICD-10-CM | POA: Diagnosis present

## 2022-09-28 DIAGNOSIS — N183 Chronic kidney disease, stage 3 unspecified: Secondary | ICD-10-CM | POA: Diagnosis not present

## 2022-09-28 DIAGNOSIS — Z20822 Contact with and (suspected) exposure to covid-19: Secondary | ICD-10-CM | POA: Diagnosis present

## 2022-09-28 DIAGNOSIS — R079 Chest pain, unspecified: Secondary | ICD-10-CM | POA: Diagnosis not present

## 2022-09-28 LAB — BASIC METABOLIC PANEL
Anion gap: 8 (ref 5–15)
BUN: 15 mg/dL (ref 8–23)
CO2: 24 mmol/L (ref 22–32)
Calcium: 8.7 mg/dL — ABNORMAL LOW (ref 8.9–10.3)
Chloride: 106 mmol/L (ref 98–111)
Creatinine, Ser: 1.35 mg/dL — ABNORMAL HIGH (ref 0.61–1.24)
GFR, Estimated: 59 mL/min — ABNORMAL LOW (ref >60)
Glucose, Bld: 104 mg/dL — ABNORMAL HIGH (ref 70–99)
Potassium: 3.9 mmol/L (ref 3.5–5.1)
Sodium: 138 mmol/L (ref 135–145)

## 2022-09-28 LAB — RESP PANEL BY RT-PCR (FLU A&B, COVID) ARPGX2
Influenza A by PCR: NEGATIVE
Influenza B by PCR: NEGATIVE
SARS Coronavirus 2 by RT PCR: NEGATIVE

## 2022-09-28 LAB — CBC
HCT: 45.4 % (ref 39.0–52.0)
Hemoglobin: 15.6 g/dL (ref 13.0–17.0)
MCH: 31.4 pg (ref 26.0–34.0)
MCHC: 34.4 g/dL (ref 30.0–36.0)
MCV: 91.3 fL (ref 80.0–100.0)
Platelets: 155 10*3/uL (ref 150–400)
RBC: 4.97 MIL/uL (ref 4.22–5.81)
RDW: 12.8 % (ref 11.5–15.5)
WBC: 5.8 10*3/uL (ref 4.0–10.5)
nRBC: 0 % (ref 0.0–0.2)

## 2022-09-28 LAB — TROPONIN I (HIGH SENSITIVITY)
Troponin I (High Sensitivity): 7 ng/L (ref <18)
Troponin I (High Sensitivity): 9 ng/L (ref <18)

## 2022-09-28 MED ORDER — HEPARIN (PORCINE) 25000 UT/250ML-% IV SOLN
1200.0000 [IU]/h | INTRAVENOUS | Status: DC
  Administered 2022-09-28 – 2022-09-29 (×2): 1200 [IU]/h via INTRAVENOUS
  Filled 2022-09-28 (×2): qty 250

## 2022-09-28 MED ORDER — HEPARIN BOLUS VIA INFUSION
4000.0000 [IU] | Freq: Once | INTRAVENOUS | Status: AC
  Administered 2022-09-28: 4000 [IU] via INTRAVENOUS

## 2022-09-28 MED ORDER — NITROGLYCERIN 0.4 MG SL SUBL: 0.4000 mg | SUBLINGUAL_TABLET | SUBLINGUAL | Status: DC | PRN

## 2022-09-28 MED ORDER — ASPIRIN 81 MG PO CHEW
324.0000 mg | CHEWABLE_TABLET | Freq: Once | ORAL | Status: AC
  Administered 2022-09-28: 324 mg via ORAL
  Filled 2022-09-28: qty 4

## 2022-09-28 MED ORDER — ACETAMINOPHEN 500 MG PO TABS
1000.0000 mg | ORAL_TABLET | Freq: Once | ORAL | Status: AC
  Administered 2022-09-28: 1000 mg via ORAL
  Filled 2022-09-28: qty 2

## 2022-09-28 MED ORDER — NITROGLYCERIN IN D5W 200-5 MCG/ML-% IV SOLN
5.0000 ug/min | INTRAVENOUS | Status: DC
  Administered 2022-09-28: 5 ug/min via INTRAVENOUS
  Filled 2022-09-28: qty 250

## 2022-09-28 NOTE — ED Notes (Signed)
Chest pain from 9/10 to 0/10, resolving with IV nitroglycerin titration. Patient c/o headache 4/10 PA notified and tylenol ordered and administered.

## 2022-09-28 NOTE — ED Provider Notes (Signed)
[facility] EMERGENCY DEPARTMENT Provider Note   CSN: [id number] Arrival date & time: 09/28/22  2039     History  Chief Complaint  Patient presents with   Chest Pain    Brandon Buck is a 61 y.o. male.  The history is provided by the patient and medical records. No language interpreter was used.     61 year old male with significant history of hypertension, CKD, CAD, prostate cancer, asthma, who presented to the ED for complaints of chest pain shortness of breath.  Patient reported for the past several weeks he has had progressive worsening exertional and nonexertional chest discomfort.  He described as a pressure sensation to his left side of chest radiates towards his back with associated lightheadedness, dizziness, nausea, and diaphoresis.  He also endorses associated shortness of breath.  He does not endorse any fever or productive cough or hemoptysis.  Pain has been waxing and waning but today it became much worse.  He took 2 nitro prior to arrival which provided minimal improvement.  He mention history of cardiac stenting in the past.  States he was supposed to have a stress test done last year but was distracted due to his prostate cancer for which she is currently receiving treatment.  No prior treatment PE or DVT.  No cold symptoms.  Rates pain as 4 out of 10.  Home Medications Prior to Admission medications   Medication Sig Start Date End Date Taking? Authorizing Provider  allopurinol (ZYLOPRIM) 100 MG tablet TAKE 2 TABLETS BY MOUTH DAILY. 01/13/22   [name], MD  ALPRAZolam Duanne Moron) 0.5 MG tablet TAKE (1) TABLET BY MOUTH TWICE DAILY AS NEEDED FOR ANXIETY 09/14/22   [name] Maid, FNP  aspirin 81 MG tablet Take 81 mg by mouth daily.    [provider]  atorvastatin (LIPITOR) 80 MG tablet TAKE (1) TABLET BY MOUTH ONCE DAILY. 11/18/21   [name], MD  ezetimibe (ZETIA) 10 MG tablet TAKE ONE TABLET BY MOUTH ONCE DAILY. 05/13/22   [name], MD   fluticasone (FLONASE) 50 MCG/ACT nasal spray Place 2 sprays into both nostrils daily. 01/29/19   [name], MD  furosemide (LASIX) 20 MG tablet Take 1 tablet (20 mg total) by mouth daily. 03/27/21   [name], MD  HYDROcodone-acetaminophen (NORCO/VICODIN) 5-325 MG tablet TAKE 1 TABLET BY MOUTH (3) TIMES DAILY AS NEEDED FOR MODERATE PAIN. 09/14/22   [name] Maid, FNP  lisinopril (ZESTRIL) 20 MG tablet Take 20 mg by mouth daily.    [provider]  metoprolol tartrate (LOPRESSOR) 50 MG tablet Take 1 tablet (50 mg total) by mouth 2 (two) times daily. 12/24/20 03/24/21  Isaiah Serge, NP  NICOTROL 10 MG inhaler INHALE 1 CARTRIDGE INTO THE LUNGS AS NEEDED FOR SMOKING CESSATION. 01/07/22   [name], MD  nitroGLYCERIN (NITROSTAT) 0.4 MG SL tablet Place 1 tablet (0.4 mg total) under the tongue every 5 (five) minutes as needed. 12/24/20   Isaiah Serge, NP  pantoprazole (PROTONIX) 40 MG tablet Take 1 tablet (40 mg total) by mouth 2 (two) times daily. 03/27/21   [name], MD  Phenazopyridine HCl (URISTAT PO) Take by mouth.    [provider]  predniSONE (DELTASONE) 10 MG tablet Take 2 tablets (20 mg total) by mouth 2 (two) times daily. 04/30/22   Veryl Speak, MD  sildenafil (VIAGRA) 100 MG tablet Take 1 tablet (100 mg total) by mouth as needed for erectile dysfunction. 07/20/22  Franchot Gallo, MD  tamsulosin (FLOMAX) 0.4 MG CAPS capsule Take 1 capsule (0.4 mg total) by mouth daily after supper. 12/09/20   Franchot Gallo, MD  tiZANidine (ZANAFLEX) 4 MG tablet Take 1 tablet (4 mg total) by mouth at bedtime as needed for muscle spasms. Do not take with alcohol or while driving or operating heavy machinery 04/28/22   Noemi Chapel A, NP  triamcinolone cream (KENALOG) 0.1 % Apply 1 application topically 2 (two) times daily. 05/11/21   Scot Jun, FNP      Allergies    Other and Keflex [cephalexin]    Review of Systems   Review of Systems  All  other systems reviewed and are negative.   Physical Exam Updated Vital Signs BP (!) 167/83   Pulse 66   Temp 98.7 F (37.1 C) (Oral)   Resp 15   Ht 6' (1.829 m)   Wt 99.8 kg   SpO2 97%   BMI 29.84 kg/m  Physical Exam Vitals and nursing note reviewed.  Constitutional:      General: He is not in acute distress.    Appearance: He is well-developed.  HENT:     Head: Atraumatic.  Eyes:     Conjunctiva/sclera: Conjunctivae normal.  Cardiovascular:     Rate and Rhythm: Normal rate and regular rhythm.     Pulses: Normal pulses.     Heart sounds: Normal heart sounds.  Pulmonary:     Effort: Pulmonary effort is normal.     Breath sounds: Normal breath sounds.  Abdominal:     Palpations: Abdomen is soft.     Tenderness: There is no abdominal tenderness.  Musculoskeletal:     Cervical back: Neck supple.     Right lower leg: No edema.     Left lower leg: No edema.  Skin:    Findings: No rash.  Neurological:     Mental Status: He is alert. Mental status is at baseline.     ED Results / Procedures / Treatments   Labs (all labs ordered are listed, but only abnormal results are displayed) Labs Reviewed  RESP PANEL BY RT-PCR (FLU A&B, COVID) ARPGX2  CBC  BASIC METABOLIC PANEL  CBC  HEPARIN LEVEL (UNFRACTIONATED)  TROPONIN I (HIGH SENSITIVITY)    EKG EKG Interpretation  Date/Time:  Tuesday September 28 2022 20:49:10 EST Ventricular Rate:  82 PR Interval:  128 QRS Duration: 79 QT Interval:  351 QTC Calculation: 410 R Axis:   64 Text Interpretation: Sinus rhythm Repol abnrm suggests ischemia, anterolateral Confirmed by Noemi Chapel [phone]) on 09/28/2022 9:15:35 PM  Radiology DG Chest Port 1 View  Result Date: 09/28/2022 CLINICAL DATA:  Chest pain EXAM: PORTABLE CHEST 1 VIEW COMPARISON:  10/10/2020 FINDINGS: Lungs are well expanded, symmetric, and clear. No pneumothorax or pleural effusion. Cardiac size within normal limits. Pulmonary vascularity is normal. Osseous  structures are age-appropriate. No acute bone abnormality. IMPRESSION: No active disease. Electronically Signed   By: Fidela Salisbury M.D.   On: 09/28/2022 21:10    Procedures .Critical Care  Performed by: Domenic Moras, PA-C Authorized by: Domenic Moras, PA-C   Critical care provider statement:    Critical care time (minutes):  30   Critical care was time spent personally by me on the following activities:  Development of treatment plan with patient or surrogate, discussions with consultants, evaluation of patient's response to treatment, examination of patient, ordering and review of laboratory studies, ordering and review of radiographic studies, ordering and performing treatments and  interventions, pulse oximetry, re-evaluation of patient's condition and review of old charts     Medications Ordered in ED Medications  nitroGLYCERIN 50 mg in dextrose 5 % 250 mL (0.2 mg/mL) infusion (15 mcg/min Intravenous Rate/Dose Change 09/28/22 2203)  heparin ADULT infusion 100 units/mL (25000 units/266m) (1,200 Units/hr Intravenous New Bag/Given 09/28/22 2125)  aspirin chewable tablet 324 mg (324 mg Oral Given 09/28/22 2112)  heparin bolus via infusion 4,000 Units (4,000 Units Intravenous Bolus from Bag 09/28/22 2125)  acetaminophen (TYLENOL) tablet 1,000 mg (1,000 mg Oral Given 09/28/22 2227)    ED Course/ Medical Decision Making/ A&P                           Medical Decision Making  BP (!) 211/78   Pulse 87   Temp 98.7 F (37.1 C) (Oral)   Resp 20   Ht 6' (1.829 m)   Wt 99.8 kg   SpO2 98%   BMI 29.84 kg/m   [id number] [age]year old male with significant history of hypertension, CKD, CAD, prostate cancer, asthma, who presented to the ED for complaints of chest pain shortness of breath.  Patient reported for the past several weeks he has had progressive worsening exertional and nonexertional chest discomfort.  He described as a pressure sensation to his left side of chest radiates towards his back  with associated lightheadedness, dizziness, nausea, and diaphoresis.  He also endorses associated shortness of breath.  He does not endorse any fever or productive cough or hemoptysis.  Pain has been waxing and waning but today it became much worse.  He took 2 nitro prior to arrival which provided minimal improvement.  He mention history of cardiac stenting in the past.  States he was supposed to have a stress test done last year but was distracted due to his prostate cancer for which she is currently receiving treatment.  No prior treatment PE or DVT.  No cold symptoms.  Rates pain as 4 out of 10.  On exam, patient appears uncomfortable but nontoxic.  Heart with normal rate and rhythm, lungs are clear to auscultation abdomen is soft nontender no evidence of peripheral edema and no reproducible chest wall tenderness.  Vitals are remarkable for elevated blood pressure of 211/78.  He is afebrile and no hypoxia.  His symptoms concerning for unstable angina.  No cold symptoms to suggest COVID or flu.  I also have low suspicion for PE.  Workup initiated.  Will give aspirin, nitro drip, and will consider heparin.  9:48 PM On reassessment and patient reported his chest pain has improved but does endorse a headache after receiving nitro drip.  Blood pressure did improved.  -Labs ordered, independently viewed and interpreted by me.  Labs remarkable for Cr. 1.35, improves from prior, normal troponin -The patient was maintained on a cardiac monitor.  I personally viewed and interpreted the cardiac monitored which showed an underlying rhythm of: NSR -Imaging independently viewed and interpreted by me and I agree with radiologist's interpretation.  Result remarkable for CXR without acute changes -This patient presents to the ED for concern of chest pain, this involves an extensive number of treatment options, and is a complaint that carries with it a high risk of complications and morbidity.  The differential diagnosis  includes unstable angina, NSTEMI, STEMI, hypertensive urgency, PE -Co morbidities that complicate the patient evaluation includes CAD, HTN, GERD, CKD -Treatment includes heparin, nitro drip, asa -Reevaluation of the patient after these medicines  showed that the patient improved -PCP office notes or outside notes reviewed -Discussion with specialist including cardiology who recommend medicine admission, cardiac echo to r/o cardiac effusion, cycle troponin and to have cardiology team to see tomorrow morning.  -Escalation to admission/observation considered: patients feels comfortable with admission  11:03 PM Appreciate consultation from [facility] Dr. Josephine Cables who agrees to admit pt for further care.           Final Clinical Impression(s) / ED Diagnoses Final diagnoses:  Unstable angina [facility])  Hypertensive urgency    Rx / DC Orders ED Discharge Orders     None         Domenic Moras, PA-C 09/28/22 2304    Noemi Chapel, MD 09/30/22 1520

## 2022-09-28 NOTE — Progress Notes (Signed)
ANTICOAGULATION CONSULT NOTE - Initial Consult  Pharmacy Consult for heparin Indication: chest pain/ACS  Allergies  Allergen Reactions   Other     Pt. Reports having seizures after anesthesia once 1999 or 2000 none since   Keflex [Cephalexin] Rash    Patient Measurements: Height: 6' (182.9 cm) Weight: 99.8 kg (220 lb) IBW/kg (Calculated) : 77.6 Heparin Dosing Weight: 97.8 kg  Vital Signs: Temp: 98.7 F (37.1 C) (12/05 2045) Temp Source: Oral (12/05 2045) BP: 211/78 (12/05 2045) Pulse Rate: 87 (12/05 2047)  Labs: No results for input(s): "HGB", "HCT", "PLT", "APTT", "LABPROT", "INR", "HEPARINUNFRC", "HEPRLOWMOCWT", "CREATININE", "CKTOTAL", "CKMB", "TROPONINIHS" in the last 72 hours.  CrCl cannot be calculated (Patient's most recent lab result is older than the maximum 21 days allowed.).   Medical History: Past Medical History:  Diagnosis Date   Anxiety    Arthritis    Asthma    Cancer ([place])    right renal cancer, right radical nephrectomy 2004   Chronic back pain    Chronic hip pain, left    CKD (chronic kidney disease) stage 3, GFR 30-59 ml/min (HCC)    ckd stage 3 b    Complication of anesthesia    1 seizure after anesthesia 1999 or 2000 none since , recent surgeries no seizures   Coronary artery disease    a. s/p prior PCI to RCA and LCx in 2009   Depression    GERD (gastroesophageal reflux disease)    Gout    no recent flares   Heart disease    High blood pressure    History of PTCA 2 2004   2 stents   History of stomach ulcers    Hypercholesteremia    Hypertension    Kidney disease    Myocardial infarction ([place]) 2004   Prostate cancer ([place])    3/12 cores upt to 40% grade 2 cancer. Planning brachytherapy (2021)   Proteinuria    Renal insufficiency    Tennis elbow     Medications:  (Not in a hospital admission)  Scheduled:   aspirin  324 mg Oral Once   heparin  4,000 Units Intravenous Once    Assessment: Patient admitted with persistent  chest pain/ACS. No anticoagulation PTA. No baseline CBC or CMP.   Goal of Therapy:  Heparin level 0.3-0.7 units/ml Monitor platelets by anticoagulation protocol: Yes   Plan:  Give 4000 units bolus x 1 Start heparin infusion at 1200 units/hr (~13 units/kg) Check anti-Xa level with morning labs and daily while on heparin Continue to monitor H&H and platelets  Eddie Candle, PharmD, BCPS, BCOP Clinical Pharmacist 09/28/2022,9:06 PM

## 2022-09-28 NOTE — ED Triage Notes (Signed)
Pt c/o intermittent chest pain for a while. Pt states he has taken a bottle of nitro in the last week with no relief.

## 2022-09-28 NOTE — ED Notes (Signed)
Flow Manager called to page hospitalist again Spoke to [facility]

## 2022-09-29 ENCOUNTER — Other Ambulatory Visit (HOSPITAL_COMMUNITY): Payer: Self-pay | Admitting: *Deleted

## 2022-09-29 ENCOUNTER — Encounter (HOSPITAL_COMMUNITY)
Admission: EM | Disposition: A | Discharge status: Home / Self Care | Payer: Self-pay | Source: Home / Self Care | Attending: Internal Medicine

## 2022-09-29 ENCOUNTER — Encounter (HOSPITAL_COMMUNITY): Payer: Self-pay

## 2022-09-29 ENCOUNTER — Other Ambulatory Visit (HOSPITAL_COMMUNITY): Payer: PPO

## 2022-09-29 DIAGNOSIS — Z8249 Family history of ischemic heart disease and other diseases of the circulatory system: Secondary | ICD-10-CM | POA: Diagnosis not present

## 2022-09-29 DIAGNOSIS — J4489 Other specified chronic obstructive pulmonary disease: Secondary | ICD-10-CM | POA: Diagnosis present

## 2022-09-29 DIAGNOSIS — Z8546 Personal history of malignant neoplasm of prostate: Secondary | ICD-10-CM

## 2022-09-29 DIAGNOSIS — K219 Gastro-esophageal reflux disease without esophagitis: Secondary | ICD-10-CM | POA: Insufficient documentation

## 2022-09-29 DIAGNOSIS — I161 Hypertensive emergency: Secondary | ICD-10-CM | POA: Diagnosis present

## 2022-09-29 DIAGNOSIS — I200 Unstable angina: Secondary | ICD-10-CM | POA: Diagnosis present

## 2022-09-29 DIAGNOSIS — Z905 Acquired absence of kidney: Secondary | ICD-10-CM | POA: Diagnosis not present

## 2022-09-29 DIAGNOSIS — Z9861 Coronary angioplasty status: Secondary | ICD-10-CM

## 2022-09-29 DIAGNOSIS — E7849 Other hyperlipidemia: Secondary | ICD-10-CM | POA: Diagnosis present

## 2022-09-29 DIAGNOSIS — Z79899 Other long term (current) drug therapy: Secondary | ICD-10-CM | POA: Diagnosis not present

## 2022-09-29 DIAGNOSIS — Z955 Presence of coronary angioplasty implant and graft: Secondary | ICD-10-CM | POA: Diagnosis not present

## 2022-09-29 DIAGNOSIS — N183 Chronic kidney disease, stage 3 unspecified: Secondary | ICD-10-CM | POA: Diagnosis not present

## 2022-09-29 DIAGNOSIS — I252 Old myocardial infarction: Secondary | ICD-10-CM | POA: Diagnosis not present

## 2022-09-29 DIAGNOSIS — Z8042 Family history of malignant neoplasm of prostate: Secondary | ICD-10-CM | POA: Diagnosis not present

## 2022-09-29 DIAGNOSIS — I25110 Atherosclerotic heart disease of native coronary artery with unstable angina pectoris: Secondary | ICD-10-CM

## 2022-09-29 DIAGNOSIS — E875 Hyperkalemia: Secondary | ICD-10-CM | POA: Diagnosis present

## 2022-09-29 DIAGNOSIS — E782 Mixed hyperlipidemia: Secondary | ICD-10-CM | POA: Insufficient documentation

## 2022-09-29 DIAGNOSIS — Z7982 Long term (current) use of aspirin: Secondary | ICD-10-CM | POA: Diagnosis not present

## 2022-09-29 DIAGNOSIS — Z8052 Family history of malignant neoplasm of bladder: Secondary | ICD-10-CM | POA: Diagnosis not present

## 2022-09-29 DIAGNOSIS — Z20822 Contact with and (suspected) exposure to covid-19: Secondary | ICD-10-CM | POA: Diagnosis present

## 2022-09-29 DIAGNOSIS — Z87891 Personal history of nicotine dependence: Secondary | ICD-10-CM | POA: Diagnosis not present

## 2022-09-29 DIAGNOSIS — I160 Hypertensive urgency: Secondary | ICD-10-CM

## 2022-09-29 DIAGNOSIS — R079 Chest pain, unspecified: Secondary | ICD-10-CM | POA: Diagnosis not present

## 2022-09-29 DIAGNOSIS — Z7952 Long term (current) use of systemic steroids: Secondary | ICD-10-CM | POA: Diagnosis not present

## 2022-09-29 DIAGNOSIS — I10 Essential (primary) hypertension: Secondary | ICD-10-CM | POA: Diagnosis not present

## 2022-09-29 DIAGNOSIS — Z801 Family history of malignant neoplasm of trachea, bronchus and lung: Secondary | ICD-10-CM | POA: Diagnosis not present

## 2022-09-29 DIAGNOSIS — Z85528 Personal history of other malignant neoplasm of kidney: Secondary | ICD-10-CM | POA: Diagnosis not present

## 2022-09-29 DIAGNOSIS — I2510 Atherosclerotic heart disease of native coronary artery without angina pectoris: Secondary | ICD-10-CM

## 2022-09-29 DIAGNOSIS — I129 Hypertensive chronic kidney disease with stage 1 through stage 4 chronic kidney disease, or unspecified chronic kidney disease: Secondary | ICD-10-CM | POA: Diagnosis present

## 2022-09-29 DIAGNOSIS — N1832 Chronic kidney disease, stage 3b: Secondary | ICD-10-CM | POA: Diagnosis present

## 2022-09-29 DIAGNOSIS — Z8051 Family history of malignant neoplasm of kidney: Secondary | ICD-10-CM | POA: Diagnosis not present

## 2022-09-29 HISTORY — PX: LEFT HEART CATH AND CORONARY ANGIOGRAPHY: CATH118249

## 2022-09-29 LAB — CBC
HCT: 44.2 % (ref 39.0–52.0)
Hemoglobin: 15 g/dL (ref 13.0–17.0)
MCH: 31 pg (ref 26.0–34.0)
MCHC: 33.9 g/dL (ref 30.0–36.0)
MCV: 91.3 fL (ref 80.0–100.0)
Platelets: 143 10*3/uL — ABNORMAL LOW (ref 150–400)
RBC: 4.84 MIL/uL (ref 4.22–5.81)
RDW: 12.9 % (ref 11.5–15.5)
WBC: 4.9 10*3/uL (ref 4.0–10.5)
nRBC: 0 % (ref 0.0–0.2)

## 2022-09-29 LAB — BASIC METABOLIC PANEL
Anion gap: 8 (ref 5–15)
BUN: 13 mg/dL (ref 8–23)
CO2: 25 mmol/L (ref 22–32)
Calcium: 8.9 mg/dL (ref 8.9–10.3)
Chloride: 104 mmol/L (ref 98–111)
Creatinine, Ser: 1.38 mg/dL — ABNORMAL HIGH (ref 0.61–1.24)
GFR, Estimated: 58 mL/min — ABNORMAL LOW (ref >60)
Glucose, Bld: 84 mg/dL (ref 70–99)
Potassium: 4 mmol/L (ref 3.5–5.1)
Sodium: 137 mmol/L (ref 135–145)

## 2022-09-29 LAB — HEPARIN LEVEL (UNFRACTIONATED)
Heparin Unfractionated: 0.38 IU/mL (ref 0.30–0.70)
Heparin Unfractionated: 0.39 IU/mL (ref 0.30–0.70)

## 2022-09-29 SURGERY — LEFT HEART CATH AND CORONARY ANGIOGRAPHY
Anesthesia: LOCAL

## 2022-09-29 MED ORDER — SODIUM CHLORIDE 0.9% FLUSH
3.0000 mL | Freq: Two times a day (BID) | INTRAVENOUS | Status: DC
  Administered 2022-09-29 – 2022-09-30 (×2): 3 mL via INTRAVENOUS

## 2022-09-29 MED ORDER — NITROGLYCERIN 0.4 MG SL SUBL: 0.4000 mg | SUBLINGUAL_TABLET | SUBLINGUAL | Status: DC | PRN

## 2022-09-29 MED ORDER — TAMSULOSIN HCL 0.4 MG PO CAPS
0.4000 mg | ORAL_CAPSULE | Freq: Every day | ORAL | Status: DC
  Administered 2022-09-29: 0.4 mg via ORAL
  Filled 2022-09-29: qty 1

## 2022-09-29 MED ORDER — SODIUM CHLORIDE 0.9 % IV SOLN: 250.0000 mL | INTRAVENOUS | Status: DC | PRN

## 2022-09-29 MED ORDER — HYDROCODONE-ACETAMINOPHEN 5-325 MG PO TABS: 1.0000 | ORAL_TABLET | Freq: Three times a day (TID) | ORAL | Status: DC | PRN

## 2022-09-29 MED ORDER — IOHEXOL 350 MG/ML SOLN
INTRAVENOUS | Status: DC | PRN
  Administered 2022-09-29: 45 mL via INTRACARDIAC

## 2022-09-29 MED ORDER — PANTOPRAZOLE SODIUM 40 MG PO TBEC
40.0000 mg | DELAYED_RELEASE_TABLET | Freq: Two times a day (BID) | ORAL | Status: DC
  Administered 2022-09-29 – 2022-09-30 (×3): 40 mg via ORAL
  Filled 2022-09-29 (×3): qty 1

## 2022-09-29 MED ORDER — MIDAZOLAM HCL 2 MG/2ML IJ SOLN
INTRAMUSCULAR | Status: AC
  Filled 2022-09-29: qty 2

## 2022-09-29 MED ORDER — LISINOPRIL 20 MG PO TABS
20.0000 mg | ORAL_TABLET | Freq: Every day | ORAL | Status: DC
  Administered 2022-09-29 – 2022-09-30 (×2): 20 mg via ORAL
  Filled 2022-09-29 (×2): qty 1

## 2022-09-29 MED ORDER — LIDOCAINE HCL (PF) 1 % IJ SOLN
INTRAMUSCULAR | Status: AC
  Filled 2022-09-29: qty 30

## 2022-09-29 MED ORDER — ALPRAZOLAM 0.5 MG PO TABS
0.5000 mg | ORAL_TABLET | Freq: Every day | ORAL | Status: DC | PRN
  Administered 2022-09-30: 0.5 mg via ORAL
  Filled 2022-09-29: qty 1

## 2022-09-29 MED ORDER — VERAPAMIL HCL 2.5 MG/ML IV SOLN
INTRAVENOUS | Status: AC
  Filled 2022-09-29: qty 2

## 2022-09-29 MED ORDER — METOPROLOL TARTRATE 25 MG PO TABS
25.0000 mg | ORAL_TABLET | Freq: Two times a day (BID) | ORAL | Status: DC
  Administered 2022-09-29 – 2022-09-30 (×3): 25 mg via ORAL
  Filled 2022-09-29 (×3): qty 1

## 2022-09-29 MED ORDER — FENTANYL CITRATE (PF) 100 MCG/2ML IJ SOLN
INTRAMUSCULAR | Status: AC
  Filled 2022-09-29: qty 2

## 2022-09-29 MED ORDER — MIDAZOLAM HCL 2 MG/2ML IJ SOLN
INTRAMUSCULAR | Status: DC | PRN
  Administered 2022-09-29: 2 mg via INTRAVENOUS
  Administered 2022-09-29: 1 mg via INTRAVENOUS

## 2022-09-29 MED ORDER — ONDANSETRON HCL 4 MG/2ML IJ SOLN: 4.0000 mg | Freq: Four times a day (QID) | INTRAMUSCULAR | Status: DC | PRN

## 2022-09-29 MED ORDER — LIDOCAINE HCL (PF) 1 % IJ SOLN
INTRAMUSCULAR | Status: DC | PRN
  Administered 2022-09-29: 2 mL via SUBCUTANEOUS

## 2022-09-29 MED ORDER — NICARDIPINE HCL IN NACL 20-0.86 MG/200ML-% IV SOLN: 5.0000 mg/h | INTRAVENOUS | Status: DC

## 2022-09-29 MED ORDER — HEPARIN SODIUM (PORCINE) 1000 UNIT/ML IJ SOLN
INTRAMUSCULAR | Status: DC | PRN
  Administered 2022-09-29: 5000 [IU] via INTRAVENOUS

## 2022-09-29 MED ORDER — ASPIRIN 81 MG PO TBEC
81.0000 mg | DELAYED_RELEASE_TABLET | Freq: Every day | ORAL | Status: DC
  Administered 2022-09-29 – 2022-09-30 (×2): 81 mg via ORAL
  Filled 2022-09-29 (×2): qty 1

## 2022-09-29 MED ORDER — MORPHINE SULFATE (PF) 2 MG/ML IV SOLN
1.0000 mg | INTRAVENOUS | Status: DC | PRN
  Administered 2022-09-29 (×2): 2 mg via INTRAVENOUS
  Filled 2022-09-29 (×2): qty 1

## 2022-09-29 MED ORDER — FLUTICASONE PROPIONATE 50 MCG/ACT NA SUSP: 2.0000 | Freq: Every day | NASAL | Status: DC

## 2022-09-29 MED ORDER — ATORVASTATIN CALCIUM 80 MG PO TABS
80.0000 mg | ORAL_TABLET | Freq: Every day | ORAL | Status: DC
  Administered 2022-09-29 – 2022-09-30 (×2): 80 mg via ORAL
  Filled 2022-09-29: qty 1
  Filled 2022-09-29: qty 2

## 2022-09-29 MED ORDER — VERAPAMIL HCL 2.5 MG/ML IV SOLN: INTRAVENOUS | Status: DC | PRN

## 2022-09-29 MED ORDER — ACETAMINOPHEN 325 MG PO TABS
650.0000 mg | ORAL_TABLET | Freq: Four times a day (QID) | ORAL | Status: DC | PRN
  Administered 2022-09-29: 650 mg via ORAL
  Filled 2022-09-29: qty 2

## 2022-09-29 MED ORDER — ACETAMINOPHEN 325 MG PO TABS
650.0000 mg | ORAL_TABLET | ORAL | Status: DC | PRN
  Administered 2022-09-29: 650 mg via ORAL
  Filled 2022-09-29: qty 2

## 2022-09-29 MED ORDER — SODIUM CHLORIDE 0.9% FLUSH: 3.0000 mL | INTRAVENOUS | Status: DC | PRN

## 2022-09-29 MED ORDER — EZETIMIBE 10 MG PO TABS
10.0000 mg | ORAL_TABLET | Freq: Every day | ORAL | Status: DC
  Administered 2022-09-29 – 2022-09-30 (×2): 10 mg via ORAL
  Filled 2022-09-29 (×2): qty 1

## 2022-09-29 MED ORDER — HEPARIN SODIUM (PORCINE) 1000 UNIT/ML IJ SOLN
INTRAMUSCULAR | Status: AC
  Filled 2022-09-29: qty 10

## 2022-09-29 MED ORDER — SODIUM CHLORIDE 0.9 % IV SOLN: INTRAVENOUS | Status: AC

## 2022-09-29 MED ORDER — LABETALOL HCL 5 MG/ML IV SOLN
10.0000 mg | INTRAVENOUS | Status: AC | PRN
  Filled 2022-09-29: qty 4

## 2022-09-29 MED ORDER — TIZANIDINE HCL 4 MG PO TABS
4.0000 mg | ORAL_TABLET | Freq: Every evening | ORAL | Status: DC | PRN
  Filled 2022-09-29: qty 1

## 2022-09-29 MED ORDER — FENTANYL CITRATE (PF) 100 MCG/2ML IJ SOLN
INTRAMUSCULAR | Status: DC | PRN
  Administered 2022-09-29 (×2): 25 ug via INTRAVENOUS

## 2022-09-29 MED ORDER — ALLOPURINOL 100 MG PO TABS: 200.0000 mg | ORAL_TABLET | Freq: Every day | ORAL | Status: DC

## 2022-09-29 MED ORDER — HEPARIN (PORCINE) IN NACL 1000-0.9 UT/500ML-% IV SOLN
INTRAVENOUS | Status: AC
  Filled 2022-09-29: qty 1000

## 2022-09-29 MED ORDER — HYDRALAZINE HCL 20 MG/ML IJ SOLN: 10.0000 mg | INTRAMUSCULAR | Status: AC | PRN

## 2022-09-29 MED ORDER — PREDNISONE 20 MG PO TABS: 20.0000 mg | ORAL_TABLET | Freq: Two times a day (BID) | ORAL | Status: DC

## 2022-09-29 MED ORDER — HEPARIN (PORCINE) IN NACL 1000-0.9 UT/500ML-% IV SOLN
INTRAVENOUS | Status: DC | PRN
  Administered 2022-09-29 (×2): 500 mL

## 2022-09-29 MED ORDER — SODIUM CHLORIDE 0.9 % IV SOLN: INTRAVENOUS | Status: DC

## 2022-09-29 SURGICAL SUPPLY — 11 items
CATH 5FR JL3.5 JR4 ANG PIG MP (CATHETERS) IMPLANT
DEVICE RAD COMP TR BAND LRG (VASCULAR PRODUCTS) IMPLANT
GLIDESHEATH SLEND SS 6F .021 (SHEATH) IMPLANT
GUIDEWIRE INQWIRE 1.5J.035X260 (WIRE) IMPLANT
INQWIRE 1.5J .035X260CM (WIRE) ×2
KIT HEART LEFT (KITS) ×1 IMPLANT
PACK CARDIAC CATHETERIZATION (CUSTOM PROCEDURE TRAY) ×1 IMPLANT
SHEATH PROBE COVER 6X72 (BAG) IMPLANT
SYR MEDRAD MARK 7 150ML (SYRINGE) ×1 IMPLANT
TRANSDUCER W/STOPCOCK (MISCELLANEOUS) ×1 IMPLANT
TUBING CIL FLEX 10 FLL-RA (TUBING) ×1 IMPLANT

## 2022-09-29 NOTE — H&P (View-Only) (Signed)
Cardiology Consultation   Patient ID: Brandon Buck MRN: [id number]; DOB: October 12, 1961  Admit date: 09/28/2022 Date of Consult: 09/29/2022  PCP:  [name], MD   [facility] Providers Cardiologist: Previously Dr. Bronson Ing --> Will switch to Dr. Dellia Cloud  Patient Profile:   Brandon Buck is a 61 y.o. male with a hx of CAD (s/p prior PCI to RCA and LCx in 2009), HTN, HLD, COPD, prostate cancer (s/p seed implantation and brachytherapy) and Stage 3 CKD (prior right nephrectomy in 2003) who is being seen 09/29/2022 for the evaluation of chest pain at the request of Dr. Josephine Cables.  History of Present Illness:   [name] was last examined by myself in 12/2020 and reported baseline dyspnea on exertion in the setting of COPD but felt like symptoms had progressed and reported intermittent episodes of chest discomfort as well. A Lexiscan Myoview was recommended for further ischemic evaluation but this was never performed and he has not been evaluated by Cardiology since.  He presented to [facility] ED on 09/28/2022 for evaluation of chest pain. In talking with the patient today, he reports having progressive episodes of chest discomfort for the past 3 to 4 weeks. Reports his pain is an aching pain which occurs along his left pectoral region and radiates into his back at times.  Reports associated diaphoresis when this occurs. He has been taking nitroglycerin and utilizes over 3 tablets at a time and reports this helps with his pain. He has gone through an entire bottle within the past few weeks.  Yesterday, he developed chest pain when walking less than 50 feet and decided to seek evaluation. He denies any specific orthopnea, PND or pitting edema. Says his BP has been elevated when checked at home and that Lisinopril was previously reduced by his PCP due to hyperkalemia.  Was found to have hypertensive urgency upon arrival with BP at 211/78. He was started on IV Heparin and IV NTG  and SBP has improved into the 140's on most recent check. Initial labs show WBC 5.8, Hgb 15.6, platelets 155, Na+ 138, K+ 3.9 and creatinine 1.35 (close to baseline). Initial and repeat troponin values negative at 7 and 9. Negative for COVID and influenza. CXR with no active disease. EKG shows NSR, HR 82 with slight ST depression along the inferior and lateral leads.   Past Medical History:  Diagnosis Date   Anxiety    Arthritis    Asthma    Cancer ([place])    right renal cancer, right radical nephrectomy 2004   Chronic back pain    Chronic hip pain, left    CKD (chronic kidney disease) stage 3, GFR 30-59 ml/min (HCC)    ckd stage 3 b    Complication of anesthesia    1 seizure after anesthesia 1999 or 2000 none since , recent surgeries no seizures   Coronary artery disease    a. s/p prior PCI to RCA and LCx in 2009   Depression    GERD (gastroesophageal reflux disease)    Gout    no recent flares   Heart disease    High blood pressure    History of PTCA 2 2004   2 stents   History of stomach ulcers    Hypercholesteremia    Hypertension    Kidney disease    Myocardial infarction ([place]) 2004   Prostate cancer ([place])    3/12 cores upt to 40% grade 2 cancer. Planning brachytherapy (2021)  Proteinuria    Renal insufficiency    Tennis elbow     Past Surgical History:  Procedure Laterality Date   COLONOSCOPY WITH PROPOFOL N/A 10/28/2020   Procedure: COLONOSCOPY WITH PROPOFOL;  Surgeon: Harvel Quale, MD;  [facility];  Service: Gastroenterology;  Laterality: N/A;  2:30   coloscopy  10/28/2020   removed 7 polyps done at [facility] N/A 11/03/2020   Procedure: CYSTOSCOPY;  Surgeon: Franchot Gallo, MD;  Location: [facility];  Service: Urology;  Laterality: N/A;   Heart catherization  2004   2 stents to heart cypher left circulflex   HERNIA REPAIR  1999   left groin bilateral   HIP SURGERY Left 1999 or 2000   bond graft   Kidney  removed Right 2003 or 2004   for cancer   POLYPECTOMY  10/28/2020   Procedure: POLYPECTOMY INTESTINAL;  Surgeon: Harvel Quale, MD;  Location: [facility];  Service: Gastroenterology;;   PROSTATE BIOPSY  2021   RADIOACTIVE SEED IMPLANT N/A 11/03/2020   Procedure: RADIOACTIVE SEED IMPLANT/BRACHYTHERAPY IMPLANT;  Surgeon: Franchot Gallo, MD;  [facility];  Service: Urology;  Laterality: N/A;  [place] N/A 11/03/2020   Procedure: SPACE OAR INSTILLATION;  Surgeon: Franchot Gallo, MD;  Location: [facility];  Service: Urology;  Laterality: N/A;     Home Medications:  Prior to Admission medications   Medication Sig Start Date End Date Taking? Authorizing Provider  ALPRAZolam Duanne Moron) 0.5 MG tablet TAKE (1) TABLET BY MOUTH TWICE DAILY AS NEEDED FOR ANXIETY 09/14/22  Yes [name] Maid, FNP  aspirin 81 MG tablet Take 81 mg by mouth daily.   Yes [provider]  HYDROcodone-acetaminophen (NORCO/VICODIN) 5-325 MG tablet TAKE 1 TABLET BY MOUTH (3) TIMES DAILY AS NEEDED FOR MODERATE PAIN. 09/14/22  Yes [name] Maid, FNP  nitroGLYCERIN (NITROSTAT) 0.4 MG SL tablet Place 1 tablet (0.4 mg total) under the tongue every 5 (five) minutes as needed. 12/24/20  Yes Isaiah Serge, NP  sildenafil (VIAGRA) 100 MG tablet Take 1 tablet (100 mg total) by mouth as needed for erectile dysfunction. 07/20/22  Yes Dahlstedt, Annie Main, MD  allopurinol (ZYLOPRIM) 100 MG tablet TAKE 2 TABLETS BY MOUTH DAILY. Patient not taking: Reported on 09/29/2022 01/13/22   [name], MD  atorvastatin (LIPITOR) 80 MG tablet TAKE (1) TABLET BY MOUTH ONCE DAILY. Patient not taking: Reported on 09/29/2022 11/18/21   [name], MD  ezetimibe (ZETIA) 10 MG tablet TAKE ONE TABLET BY MOUTH ONCE DAILY. Patient not taking: Reported on 09/29/2022 05/13/22   [name], MD  fluticasone [facility]) 50 MCG/ACT nasal spray Place 2 sprays into both  nostrils daily. Patient not taking: Reported on 09/29/2022 01/29/19   [name], MD  furosemide (LASIX) 20 MG tablet Take 1 tablet (20 mg total) by mouth daily. Patient not taking: Reported on 09/29/2022 03/27/21   [name], MD  lisinopril (ZESTRIL) 20 MG tablet Take 20 mg by mouth daily. Patient not taking: Reported on 09/29/2022    [provider]  metoprolol tartrate (LOPRESSOR) 50 MG tablet Take 1 tablet (50 mg total) by mouth 2 (two) times daily. 12/24/20 03/24/21  Isaiah Serge, NP  NICOTROL 10 MG inhaler INHALE 1 CARTRIDGE INTO THE LUNGS AS NEEDED FOR SMOKING CESSATION. 01/07/22   [name], MD  pantoprazole (PROTONIX) 40 MG tablet Take 1 tablet (40 mg total)  by mouth 2 (two) times daily. Patient not taking: Reported on 09/29/2022 03/27/21   [name], MD  predniSONE (DELTASONE) 10 MG tablet Take 2 tablets (20 mg total) by mouth 2 (two) times daily. Patient not taking: Reported on 09/29/2022 04/30/22   Veryl Speak, MD  tamsulosin (FLOMAX) 0.4 MG CAPS capsule Take 1 capsule (0.4 mg total) by mouth daily after supper. Patient not taking: Reported on 09/29/2022 12/09/20   Franchot Gallo, MD  tiZANidine (ZANAFLEX) 4 MG tablet Take 1 tablet (4 mg total) by mouth at bedtime as needed for muscle spasms. Do not take with alcohol or while driving or operating heavy machinery Patient not taking: Reported on 09/29/2022 04/28/22   Eulogio Bear, NP  triamcinolone cream (KENALOG) 0.1 % Apply 1 application topically 2 (two) times daily. Patient not taking: Reported on 09/29/2022 05/11/21   Scot Jun, FNP    Inpatient Medications: Scheduled Meds:  Continuous Infusions:  heparin 1,200 Units/hr (09/28/22 2125)   nitroGLYCERIN 15 mcg/min (09/28/22 2203)   PRN Meds: acetaminophen, morphine injection  Allergies:    Allergies  Allergen Reactions   Other     Pt. Reports having seizures after anesthesia once 1999 or 2000 none since   Keflex [Cephalexin]  Rash    Social History:   Social History   Socioeconomic History   Marital status: Married    Spouse name: Sheree   Number of children: 0   Years of education: Not on file   Highest education level: Not on file  Occupational History   Occupation: retired  Tobacco Use   Smoking status: Former    Packs/day: 1.50    Years: 32.00    Total pack years: 48.00    Types: Cigarettes    Start date: 01/26/1971    Quit date: 01/26/2003    Years since quitting: 19.6   Smokeless tobacco: Never  Vaping Use   Vaping Use: Never used  Substance and Sexual Activity   Alcohol use: Yes    Alcohol/week: 0.0 standard drinks of alcohol    Comment: occ 2 week   Drug use: No   Sexual activity: Yes  Other Topics Concern   Not on file  Social History Narrative   Worked as an Clinical biochemist.    Married x 45 years in 2022.   Social Determinants of Health   Financial Resource Strain: Low Risk  (07/24/2021)   Overall Financial Resource Strain (CARDIA)    Difficulty of Paying Living Expenses: Not hard at all  Food Insecurity: No Food Insecurity (07/24/2021)   Hunger Vital Sign    Worried About Running Out of Food in the Last Year: Never true    Ran Out of Food in the Last Year: Never true  Transportation Needs: No Transportation Needs (07/24/2021)   PRAPARE - Hydrologist (Medical): No    Lack of Transportation (Non-Medical): No  Physical Activity: Sufficiently Active (07/24/2021)   Exercise Vital Sign    Days of Exercise per Week: 5 days    Minutes of Exercise per Session: 30 min  Stress: No Stress Concern Present (07/24/2021)   [place]    Feeling of Stress : Not at all  Social Connections: [place] (07/24/2021)   Social Connection and Isolation Panel [NHANES]    Frequency of Communication with Friends and Family: More than three times a week    Frequency of Social Gatherings with Friends and  Family: Three  times a week    Attends Religious Services: 1 to 4 times per year    Active Member of Clubs or Organizations: No    Attends Archivist Meetings: 1 to 4 times per year    Marital Status: Married  Human resources officer Violence: Not At Risk (07/24/2021)   Humiliation, Afraid, Rape, and Kick questionnaire    Fear of Current or Ex-Partner: No    Emotionally Abused: No    Physically Abused: No    Sexually Abused: No    Family History:    Family History  Problem Relation Age of Onset   Heart disease Other    Cancer Other    Kidney disease Other    Heart disease Father    Prostate cancer Father    Heart disease Mother    Lung cancer Mother    Kidney cancer Mother    Prostate cancer Maternal Uncle    Bladder Cancer Maternal Grandfather    Breast cancer Neg Hx    Colon cancer Neg Hx    Pancreatic cancer Neg Hx      ROS:  Please see the history of present illness.   All other ROS reviewed and negative.     Physical Exam/Data:   Vitals:   09/29/22 0330 09/29/22 0430 09/29/22 0500 09/29/22 0801  BP: (!) 151/80 (!) 165/83  (!) 147/99  Pulse: (!) 57 61 68 66  Resp: '18 18  16  '$ Temp:    (!) 96.8 F (36 C)  TempSrc:    Axillary  SpO2: 99% 97% 97% 100%  Weight:      Height:       No intake or output data in the 24 hours ending 09/29/22 0916    09/28/2022    8:44 PM 07/28/2022    9:28 AM 07/20/2022   10:38 AM  Last 3 Weights  Weight (lbs) 220 lb 213 lb 213 lb  Weight (kg) 99.791 kg 96.616 kg 96.616 kg     Body mass index is 29.84 kg/m.  General:  Well nourished, well developed male appearing in no acute distress HEENT: normal Neck: no JVD Vascular: No carotid bruits; Distal pulses 2+ bilaterally Cardiac:  normal S1, S2; RRR; no murmur Lungs:  clear to auscultation bilaterally, no wheezing, rhonchi or rales  Abd: soft, nontender, no hepatomegaly  Ext: no pitting edema Musculoskeletal:  No deformities, BUE and BLE strength normal and equal Skin: warm  and dry  Neuro:  CNs 2-12 intact, no focal abnormalities noted Psych:  Normal affect   EKG:  The EKG was personally reviewed and demonstrates: NSR, HR 82 with slight ST depression along inferior and lateral leads.   Relevant CV Studies:  Cardiac Catheterization: 2009  CONCLUSION:  1. Successful percutaneous transluminal angioplasty with placement of      a Cypher 2.75 x 18-mm stent in the mid left circumflex stenotic      lesion.  2. Successful percutaneous transluminal balloon angioplasty with      placement of a 2.75 x 23-mm Cypher of the distal RCA, ultimately      postdilated to 3 mm.  3. Adjuvant use of Angiomax infusion.  NST: 05/2016 Equivocal ST segment changes, mainly lead II, III, and aVF, less than 1 mm. Intermediate Duke treadmill score of 0.5. Blood pressure demonstrated a hypertensive response to exercise. Small, mild intensity, fixed mid to basal inferior defect that is more prominent at rest and consistent with soft tissue attenuation. This is a low risk study based  on perfusion imaging. Nuclear stress EF: 71%.  Echocardiogram: 12/2020 IMPRESSIONS     1. Left ventricular ejection fraction, by estimation, is 65 to 70%. The  left ventricle has normal function. The left ventricle has no regional  wall motion abnormalities. Left ventricular diastolic parameters were  normal.   2. Right ventricular systolic function is normal. The right ventricular  size is normal. There is normal pulmonary artery systolic pressure.   3. Left atrial size was mildly dilated.   4. The mitral valve is normal in structure. Trivial mitral valve  regurgitation. No evidence of mitral stenosis.   5. The aortic valve is tricuspid. Aortic valve regurgitation is not  visualized. No aortic stenosis is present.   6. The inferior vena cava is normal in size with greater than 50%  respiratory variability, suggesting right atrial pressure of 3 mmHg.   Laboratory Data:  High Sensitivity  Troponin:   Recent Labs  Lab 09/28/22 2050 09/28/22 2258  TROPONINIHS 7 9     Chemistry Recent Labs  Lab 09/28/22 2050  NA 138  K 3.9  CL 106  CO2 24  GLUCOSE 104*  BUN 15  CREATININE 1.35*  CALCIUM 8.7*  GFRNONAA 59*  ANIONGAP 8    No results for input(s): "PROT", "ALBUMIN", "AST", "ALT", "ALKPHOS", "BILITOT" in the last 168 hours. Lipids No results for input(s): "CHOL", "TRIG", "HDL", "LABVLDL", "LDLCALC", "CHOLHDL" in the last 168 hours.  Hematology Recent Labs  Lab 09/28/22 2050 09/29/22 0529  WBC 5.8 4.9  RBC 4.97 4.84  HGB 15.6 15.0  HCT 45.4 44.2  MCV 91.3 91.3  MCH 31.4 31.0  MCHC 34.4 33.9  RDW 12.8 12.9  PLT 155 143*   Thyroid No results for input(s): "TSH", "FREET4" in the last 168 hours.  BNPNo results for input(s): "BNP", "PROBNP" in the last 168 hours.  DDimer No results for input(s): "DDIMER" in the last 168 hours.   Radiology/Studies:  DG Chest Port 1 View  Result Date: 09/28/2022 CLINICAL DATA:  Chest pain EXAM: PORTABLE CHEST 1 VIEW COMPARISON:  10/10/2020 FINDINGS: Lungs are well expanded, symmetric, and clear. No pneumothorax or pleural effusion. Cardiac size within normal limits. Pulmonary vascularity is normal. Osseous structures are age-appropriate. No acute bone abnormality. IMPRESSION: No active disease. Electronically Signed   By: Fidela Salisbury M.D.   On: 09/28/2022 21:10     Assessment and Plan:   1. Chest Pain concerning for Accelerating Angina - Presents with progressive chest pain for the past 3-4 weeks and has been using SL NTG with resolution of his symptoms but has now used an entire bottle. Has been pain-free since being started on IV Heparin and IV NTG (headache has limited further titration).  - Initial and repeat troponin values negative at 7 and 9. EKG shows NSR, HR 82 with slight ST depression along the inferior and lateral leads which has been noted on prior tracings.  - Will review with Dr. Dellia Cloud but anticipate a  cardiac catheterization for definitive evaluation given his progressive symptoms. Reviewed with the patient and he is in agreement to proceed if indicated. The patient understands that risks include but are not limited to stroke (1 in 1000), death (1 in 65), kidney failure [usually temporary] (1 in 500), bleeding (1 in 200), allergic reaction [possibly serious] (1 in 200).   - Continue IV Heparin, IV NTG, ASA, Atorvastatin, and Zetia. Will restart Lopressor.   Addendum: Added to the cath board for later today. Will be on the Cardiology  service [facility] at [facility]. Cath Lab, [facility] and the Card Master have been updated.   2. CAD - He underwent prior stenting to the RCA and LCx in 2009 as outlined above and most recent ischemic evaluation was a low-risk NST in 2017. Will plan for repeat ischemic evaluation this admission.  - Continue IV, Heparin, IV NTG, ASA '81mg'$  daily, Atorvastatin '80mg'$  daily and Zetia '10mg'$  daily. Will restart Lopressor but at a lower dose of '25mg'$  BID given his HR in the 60's at times.   3. Hypertensive Urgency - His BP was at 211/78 upon arrival to the ED and has improved with IV NTG with SBP currently in the 140's. Would not further titrate given his headache and do not want to reduce BP too quickly as he reports it has been significantly elevated for several weeks. Will restart Lopressor. Hold on restarting Lisinopril until after his catheterization.   4. HLD - Will recheck an FLP. He remains on Atorvastatin '80mg'$  daily and Zetia '10mg'$  daily.   5. History of Prostate Cancer - Previously underwent seed implantation and brachytherapy and says he is now in surveillance and has follow-up on an annual basis.   6. Stage 3 CKD  - He is s/p right nephrectomy in 2003. Baseline creatinine 1.4-  1.5, improved to 1.35 on admission. Will hydrate prior to his cath.     For questions or updates, please contact [place] Please consult [email or web address] for contact info under     Signed, Erma Heritage, PA-C  09/29/2022 9:16 AM

## 2022-09-29 NOTE — ED Notes (Signed)
Report has been given to transport and to cath lab at [facility]. [facility] has assumed care of patient.

## 2022-09-29 NOTE — Progress Notes (Signed)
ANTICOAGULATION CONSULT NOTE -   Pharmacy Consult for heparin Indication: chest pain/ACS  Allergies  Allergen Reactions   Other     Pt. Reports having seizures after anesthesia once 1999 or 2000 none since   Keflex [Cephalexin] Rash    Patient Measurements: Height: 6' (182.9 cm) Weight: 99.8 kg (220 lb) IBW/kg (Calculated) : 77.6 Heparin Dosing Weight: 97.8 kg  Vital Signs: Temp: 97.7 F (36.5 C) (12/06 1225) Temp Source: Oral (12/06 1225) BP: 163/96 (12/06 1215) Pulse Rate: 64 (12/06 1215)  Labs: Recent Labs    09/28/22 2050 09/28/22 2258 09/29/22 0529 09/29/22 1302  HGB 15.6  --  15.0  --   HCT 45.4  --  44.2  --   PLT 155  --  143*  --   HEPARINUNFRC  --   --  0.38 0.39  CREATININE 1.35*  --   --  1.38*  TROPONINIHS 7 9  --   --      Estimated Creatinine Clearance: 67.9 mL/min (A) (by C-G formula based on SCr of 1.38 mg/dL (H)).   Medical History: Past Medical History:  Diagnosis Date   Anxiety    Arthritis    Asthma    Cancer ([place])    right renal cancer, right radical nephrectomy 2004   Chronic back pain    Chronic hip pain, left    CKD (chronic kidney disease) stage 3, GFR 30-59 ml/min (HCC)    ckd stage 3 b    Complication of anesthesia    1 seizure after anesthesia 1999 or 2000 none since , recent surgeries no seizures   Coronary artery disease    a. s/p prior PCI to RCA and LCx in 2009   Depression    GERD (gastroesophageal reflux disease)    Gout    no recent flares   Heart disease    High blood pressure    History of PTCA 2 2004   2 stents   History of stomach ulcers    Hypercholesteremia    Hypertension    Kidney disease    Myocardial infarction ([place]) 2004   Prostate cancer ([place])    3/12 cores upt to 40% grade 2 cancer. Planning brachytherapy (2021)   Proteinuria    Renal insufficiency    Tennis elbow     Medications:  (Not in a hospital admission) Scheduled:   aspirin EC  81 mg Oral Daily   atorvastatin  80 mg Oral Daily    ezetimibe  10 mg Oral Daily   metoprolol tartrate  25 mg Oral BID   pantoprazole  40 mg Oral BID   tamsulosin  0.4 mg Oral QPC supper     Assessment: Patient admitted with persistent chest pain/ACS. No anticoagulation PTA. No baseline CBC or CMP.   HL 0.39- therapeutic CBC WNL  Goal of Therapy:  Heparin level 0.3-0.7 units/ml Monitor platelets by anticoagulation protocol: Yes   Plan:  Continue heparin infusion at 1200 units/hr  Check anti-Xa level daily while on heparin Continue to monitor H&H and platelets  Margot Ables, PharmD Clinical Pharmacist 09/29/2022 2:43 PM

## 2022-09-29 NOTE — Progress Notes (Deleted)
Chronic Care Management Pharmacy Note  10/07/2022 Name:  Brandon Buck MRN:  [id number] DOB:  1961-10-07  Summary: ***  Recommendations/Changes made from today's visit: ***  Plan: ***   Subjective: Brandon Buck is an 62 y.o. year old male who is a primary patient of Pickard, [name], MD.  The CCM team was consulted for assistance with disease management and care coordination needs.    Engaged with patient by telephone for follow up visit in response to provider referral for pharmacy case management and/or care coordination services.   Consent to Services:  The patient was given the following information about Chronic Care Management services today, agreed to services, and gave verbal consent: 1. CCM service includes personalized support from designated clinical staff supervised by the primary care provider, including individualized plan of care and coordination with other care providers 2. 24/7 contact phone numbers for assistance for urgent and routine care needs. 3. Service will only be billed when office clinical staff spend 20 minutes or more in a month to coordinate care. 4. Only one practitioner may furnish and bill the service in a calendar month. 5.The patient may stop CCM services at any time (effective at the end of the month) by phone call to the office staff. 6. The patient will be responsible for cost sharing (co-pay) of up to 20% of the service fee (after annual deductible is met). Patient agreed to services and consent obtained.  Patient Care Team: [name], MD as PCP - General (Family Medicine) Mallipeddi, Quenten Raven, MD as PCP - Cardiology (Cardiology) Edythe Clarity, [facility] as Pharmacist (Pharmacist)  Recent office visits:  None noted.    Recent consult visits:  01/12/22 Franchot Gallo, MD - Urology - Malignant neoplasm of prostate - Labs were ordered. Referral placed to General Surgery. Follow up in 6 months.    11/10/21 Julienne Summerlin PA-C -  Urology - Malignant Neoplasm, of prostate - No medication changes. Follow up in 2 months    Hospital visits:  None in previous 6 months   Objective:  Lab Results  Component Value Date   CREATININE 1.67 (H) 09/30/2022   BUN 13 09/30/2022   GFRNONAA 46 (L) 09/30/2022   GFRAA 59 (L) 03/27/2021   NA 137 09/30/2022   K 4.1 09/30/2022   CALCIUM 8.9 09/30/2022   CO2 25 09/30/2022   GLUCOSE 87 09/30/2022    No results found for: "HGBA1C", "FRUCTOSAMINE", "GFR", "MICROALBUR"  Last diabetic Eye exam: No results found for: "HMDIABEYEEXA"  Last diabetic Foot exam: No results found for: "HMDIABFOOTEX"   Lab Results  Component Value Date   CHOL 182 09/30/2022   HDL 40 (L) 09/30/2022   LDLCALC 117 (H) 09/30/2022   TRIG 123 09/30/2022   CHOLHDL 4.6 09/30/2022       Latest Ref Rng & Units 02/19/2022    3:26 PM 03/27/2021   12:37 PM 10/29/2020   10:21 AM  Hepatic Function  Total Protein 6.5 - 8.1 g/dL 6.8  7.0  7.7   Albumin 3.5 - 5.0 g/dL 3.7   4.6   AST 15 - 41 U/L _0 ALT 0 - 44 U/L 22  25  39   Alk Phosphatase 38 - 126 U/L 67   72   Total Bilirubin 0.3 - 1.2 mg/dL 1.3  0.8  0.8     Lab Results  Component Value Date/Time   TSH 2.147 ***Test methodology is 3rd generation TSH*** 07/01/2009  01:15 AM       Latest Ref Rng & Units 09/30/2022    2:56 AM 09/29/2022    5:29 AM 09/28/2022    8:50 PM  CBC  WBC 4.0 - 10.5 K/uL 5.2  4.9  5.8   Hemoglobin 13.0 - 17.0 g/dL 14.7  15.0  15.6   Hematocrit 39.0 - 52.0 % 43.4  44.2  45.4   Platelets 150 - 400 K/uL 150  143  155     No results found for: "VD25OH", "VITAMINB12"  Clinical ASCVD: {YES/NO:21197} The 10-year ASCVD risk score (Arnett DK, et al., 2019) is: 13.8%   Values used to calculate the score:     Age: 48 years     Sex: Male     Is Non-Hispanic African American: No     Diabetic: No     Tobacco smoker: No     Systolic Blood Pressure: 563 mmHg     Is BP treated: Yes     HDL Cholesterol: 40 mg/dL     Total  Cholesterol: 182 mg/dL       07/24/2021   11:22 AM 03/27/2021   12:15 PM 01/31/2020    8:09 AM  Depression screen PHQ 2/9  Decreased Interest 0 0 0  Down, Depressed, Hopeless 0 0 0  PHQ - 2 Score 0 0 0     ***Other: (CHADS2VASc if Afib, MMRC or CAT for COPD, ACT, DEXA)  Social History   Tobacco Use  Smoking Status Former   Packs/day: 1.50   Years: 32.00   Total pack years: 48.00   Types: Cigarettes   Start date: 01/26/1971   Quit date: 01/26/2003   Years since quitting: 19.7  Smokeless Tobacco Never   BP Readings from Last 3 Encounters:  09/30/22 135/65  07/20/22 (!) 177/88  04/30/22 (!) 177/90   Pulse Readings from Last 3 Encounters:  09/30/22 (!) 54  07/20/22 76  04/30/22 62   Wt Readings from Last 3 Encounters:  09/28/22 220 lb (99.8 kg)  07/28/22 213 lb (96.6 kg)  07/20/22 213 lb (96.6 kg)   BMI Readings from Last 3 Encounters:  09/28/22 29.84 kg/m  07/28/22 28.89 kg/m  07/20/22 28.89 kg/m    Assessment/Interventions: Review of patient past medical history, allergies, medications, health status, including review of consultants reports, laboratory and other test data, was performed as part of comprehensive evaluation and provision of chronic care management services.   SDOH:  (Social Determinants of Health) assessments and interventions performed: {yes/no:20286} SDOH Interventions    Flowsheet Row Telephone from 10/04/2022 in [place] from 07/24/2021 in [place] Interventions    Food Insecurity Interventions -- Intervention Not Indicated  Housing Interventions Intervention Not Indicated Intervention Not Indicated  Transportation Interventions Intervention Not Indicated Intervention Not Indicated  Financial Strain Interventions Intervention Not Indicated Intervention Not Indicated  Physical Activity Interventions -- Intervention Not Indicated  Stress Interventions --  Intervention Not Indicated  Social Connections Interventions -- Intervention Not Indicated      SDOH Screenings   Food Insecurity: No Food Insecurity (09/29/2022)  Housing: Low Risk  (10/04/2022)  Transportation Needs: No Transportation Needs (10/04/2022)  Utilities: Not At Risk (09/29/2022)  Alcohol Screen: Low Risk  (07/24/2021)  Depression (PHQ2-9): Low Risk  (07/24/2021)  Financial Resource Strain: Low Risk  (10/04/2022)  Physical Activity: Sufficiently Active (07/24/2021)  Social Connections: Socially Integrated (07/24/2021)  Stress: No Stress Concern Present (07/24/2021)  Tobacco Use: Medium Risk (  09/30/2022)    CCM Care Plan  Allergies  Allergen Reactions   Other     Pt. Reports having seizures after anesthesia once 1999 or 2000 none since   Keflex [Cephalexin] Rash    Medications Reviewed Today     Reviewed by Clydell Hakim, CPhT (Pharmacy Technician) on 09/29/22 at [place] List Status: Complete   Medication Order Taking? Sig Documenting Provider Last Dose Status Informant  allopurinol (ZYLOPRIM) 100 MG tablet [id number] No TAKE 2 TABLETS BY MOUTH DAILY.  Patient not taking: Reported on 09/29/2022   [name], MD Not Taking Active   ALPRAZolam Duanne Moron) 0.5 MG tablet [id number] Yes TAKE (1) TABLET BY MOUTH TWICE DAILY AS NEEDED FOR ANXIETY [name] Maid, FNP unk Active   aspirin 81 MG tablet [id number] Yes Take 81 mg by mouth daily. [provider] 09/28/2022 Active   atorvastatin (LIPITOR) 80 MG tablet [id number] No TAKE (1) TABLET BY MOUTH ONCE DAILY.  Patient not taking: Reported on 09/29/2022   [name], MD Not Taking Active   ezetimibe (ZETIA) 10 MG tablet [id number] No TAKE ONE TABLET BY MOUTH ONCE DAILY.  Patient not taking: Reported on 09/29/2022   [name], MD Not Taking Active   fluticasone [facility]) 50 MCG/ACT nasal spray [id number] No Place 2 sprays into both nostrils daily.  Patient not taking: Reported on 09/29/2022   [name], MD Not Taking Active            Med Note Clydell Hakim   Wed Sep 29, 2022  7:25 AM)    furosemide (LASIX) 20 MG tablet [id number] No Take 1 tablet (20 mg total) by mouth daily.  Patient not taking: Reported on 09/29/2022   [name], MD Not Taking Active   HYDROcodone-acetaminophen (NORCO/VICODIN) 5-325 MG tablet [id number] Yes TAKE 1 TABLET BY MOUTH (3) TIMES DAILY AS NEEDED FOR MODERATE PAIN. [name] Maid, [place] 09/28/2022 Active   lisinopril (ZESTRIL) 20 MG tablet [id number] No Take 20 mg by mouth daily.  Patient not taking: Reported on 09/29/2022   [provider] Not Taking Active Self  metoprolol tartrate (LOPRESSOR) 50 MG tablet [id number]  Take 1 tablet (50 mg total) by mouth 2 (two) times daily. Isaiah Serge, NP  Expired 03/24/21 2359   NICOTROL 10 MG inhaler [id number]  INHALE 1 CARTRIDGE INTO THE LUNGS AS NEEDED FOR SMOKING CESSATION. [name], MD  Active   nitroGLYCERIN (NITROSTAT) 0.4 MG SL tablet [id number] Yes Place 1 tablet (0.4 mg total) under the tongue every 5 (five) minutes as needed. Isaiah Serge, NP 09/28/2022 am Active   pantoprazole (PROTONIX) 40 MG tablet [id number] No Take 1 tablet (40 mg total) by mouth 2 (two) times daily.  Patient not taking: Reported on 09/29/2022   [name], MD Not Taking Active   predniSONE (DELTASONE) 10 MG tablet [id number] No Take 2 tablets (20 mg total) by mouth 2 (two) times daily.  Patient not taking: Reported on 09/29/2022   Veryl Speak, MD Not Taking Active   sildenafil (VIAGRA) 100 MG tablet [id number] Yes Take 1 tablet (100 mg total) by mouth as needed for erectile dysfunction. Franchot Gallo, MD unk Active   tamsulosin [facility]) 0.4 MG CAPS capsule [id number] No Take 1 capsule (0.4 mg total) by mouth daily after supper.  Patient not taking: Reported on 09/29/2022   Franchot Gallo, MD Not Taking Active   tiZANidine (ZANAFLEX) 4 MG tablet [id number] No Take 1  tablet (4 mg total) by mouth at  bedtime as needed for muscle spasms. Do not take with alcohol or while driving or operating heavy machinery  Patient not taking: Reported on 09/29/2022   Eulogio Bear, NP Not Taking Active   triamcinolone cream (KENALOG) 0.1 % [id number] No Apply 1 application topically 2 (two) times daily.  Patient not taking: Reported on 09/29/2022   Scot Jun, FNP Not Taking Active             Patient Active Problem List   Diagnosis Date Noted   Hypertensive urgency 09/29/2022   Other hyperlipidemia 09/29/2022   GERD (gastroesophageal reflux disease) 09/29/2022   CAD S/P percutaneous coronary angioplasty 09/29/2022   Hypertensive emergency without congestive heart failure 09/29/2022   Unstable angina ([place]) 09/28/2022   Hematospermia 11/10/2021   Microscopic hematuria 11/10/2021   Abnormal abdominal CT scan 10/02/2020   Malignant neoplasm of prostate ([place]) 09/02/2020   Essential hypertension 10/05/2019   Hyperkalemia 10/05/2019   CKD (chronic kidney disease) stage 3, GFR 30-59 ml/min (HCC)    Proteinuria    History of PTCA 2    Gout    Plantar fasciitis 03/01/2012   HIP PAIN 11/11/2008   ASEPTIC NECROSIS 11/11/2008    Immunization History  Administered Date(s) Administered   Influenza Inj Mdck Quad With Preservative 07/25/2018   Influenza,inj,Quad PF,6+ Mos 08/06/2014   Influenza,inj,quad, With Preservative 07/06/2019   Influenza-Unspecified 08/01/2015, 07/06/2019, 09/19/2020, 08/13/2021   Moderna Sars-Covid-2 Vaccination 01/05/2020, 02/05/2020, 08/22/2020   Pneumococcal Polysaccharide-23 06/25/2009, 01/23/2018    Conditions to be addressed/monitored:  HTN, HLD  There are no care plans that you recently modified to display for this patient.    Medication Assistance: {MEDASSISTANCEINFO:25044}  Compliance/Adherence/Medication fill history: Care Gaps: ***  Star-Rating Drugs: ***  Patient's preferred pharmacy is:  [facility], [place] [address] Phone: [phone] Fax: [phone]  [place], [address] [place] [address] Phone: [phone] Fax: [phone]  Uses pill box? {Yes or If no, why not?:20788} Pt endorses ***% compliance  We discussed: {Pharmacy options:24294} Patient decided to: {US Pharmacy Plan:23885}  Care Plan and Follow Up Patient Decision:  {FOLLOWUP:24991}  Plan: {CM FOLLOW UP YTKP:54656}  ***  Current Barriers:  {pharmacybarriers:24917}  Pharmacist Clinical Goal(s):  Patient will {PHARMACYGOALCHOICES:24921} through collaboration with PharmD and provider.   Interventions: 1:1 collaboration with [name], MD regarding development and update of comprehensive plan of care as evidenced by provider attestation and co-signature Inter-disciplinary care team collaboration (see longitudinal plan of care) Comprehensive medication review performed; medication list updated in electronic medical record  Hypertension (BP goal {CHL HP UPSTREAM Pharmacist BP [phone]}) -{US controlled/uncontrolled:25276} -Current treatment: *** -Medications previously tried: ***  -Current home readings: *** -Current dietary habits: *** -Current exercise habits: *** -{ACTIONS;DENIES/REPORTS:21021675::"Denies"} hypotensive/hypertensive symptoms -Educated on {CCM BP Counseling:25124} -Counseled to monitor BP at home ***, document, and provide log at future appointments -{CCMPHARMDINTERVENTION:25122}  Hyperlipidemia: (LDL goal < ***) -{US controlled/uncontrolled:25276} -Current treatment: *** -Medications previously tried: ***  -Current dietary patterns: *** -Current exercise habits: *** -Educated on {CCM HLD Counseling:25126} -{CCMPHARMDINTERVENTION:25122}  Patient Goals/Self-Care Activities Patient will:  - {pharmacypatientgoals:24919}  Follow Up Plan: {CM FOLLOW UP CLEX:51700}

## 2022-09-29 NOTE — Interval H&P Note (Signed)
Cath Lab Visit (complete for each Cath Lab visit)  Clinical Evaluation Leading to the Procedure:   ACS: Yes.    Non-ACS:    Anginal Classification: CCS IV  Anti-ischemic medical therapy: Minimal Therapy (1 class of medications)  Non-Invasive Test Results: No non-invasive testing performed  Prior CABG: No previous CABG      History and Physical Interval Note:  09/29/2022 4:32 PM  Brandon Buck  has presented today for surgery, with the diagnosis of unstable angina.  The various methods of treatment have been discussed with the patient and family. After consideration of risks, benefits and other options for treatment, the patient has consented to  Procedure(s): LEFT HEART CATH AND CORONARY ANGIOGRAPHY (N/A) as a surgical intervention.  The patient's history has been reviewed, patient examined, no change in status, stable for surgery.  I have reviewed the patient's chart and labs.  Questions were answered to the patient's satisfaction.     Larae Grooms

## 2022-09-29 NOTE — TOC Progression Note (Signed)
Transition of Care [facility]) - Progression Note    Patient Details  Name: Brandon Buck MRN: [id number] Date of Birth: 06/22/1961  [facility]) CM/SW Contact  Salome Arnt, [place] Phone Number: 09/29/2022, 10:37 AM  Clinical Narrative:      Transition of Care [facility]) Screening Note   Patient Details  Name: Brandon Buck Date of Birth: 1961/02/10   Transition [facility] Care [facility]) CM/SW Contact:    Salome Arnt, [place] Phone Number: 09/29/2022, 10:37 AM    Transition of Care Department [facility]) has reviewed patient and no TOC needs have been identified at this time. We will continue to monitor patient advancement through interdisciplinary progression rounds. If new patient transition needs arise, please place a TOC consult.         Expected Discharge Plan and Services                                                 Social Determinants of Health (SDOH) Interventions    Readmission Risk Interventions     No data to display

## 2022-09-29 NOTE — H&P (Signed)
History and Physical    Patient: Brandon Buck QQI:[id number] DOB: November 27, 1960 DOA: 09/28/2022 DOS: the patient was seen and examined on 09/29/2022 PCP: [name], MD  Patient coming from: Home  Chief Complaint:  Chief Complaint  Patient presents with   Chest Pain   HPI: Brandon Buck is a 61 y.o. male with medical history significant of hypertension, CKD, CAD, prostate cancer, asthma who presents to the emergency department due to several weeks of chest pain and shortness of breath.  He complained of chest pain (exertional and nonexertional) within the past few weeks, chest pain was left-sided and radiates to his back.  Pain was pressure-like, it was 4/10 on pain scale and it was associated with nausea, dizziness, lightheadedness, diaphoresis.  Pain was intermittent, though it was more constant and worse yesterday.  2 nitro pills taken prior to arrival alleviated some of the chest pain.  Patient states that he was unable to follow-up with cardiac stress test last year due to his history of prostate cancer.  He denies fever, chills, nausea, vomiting.   ED Course:  In the emergency department, BP was elevated at 211/78, but other vital signs were within normal range.  Workup in the ED showed normal CBC, BMP except blood glucose of 104 and creatinine of 1.35.  Troponin x 2 was negative.  Influenza A, B, SARS coronavirus 2 was negative. Chest x-ray showed no active disease Patient was started on a heparin drip and nitroglycerin drip, aspirin 324 mg x 1 was given, Tylenol was given.  Cardiology on-call was consulted and recommended admitting patient and that cardiology will see patient in the morning per ED PA.  Hospitalist was asked to admit patient for further evaluation and management.  Review of Systems: Review of systems as noted in the HPI. All other systems reviewed and are negative.   Past Medical History:  Diagnosis Date   Anxiety    Arthritis    Asthma    Cancer ([place])     right renal cancer, right radical nephrectomy 2004   Chronic back pain    Chronic hip pain, left    CKD (chronic kidney disease) stage 3, GFR 30-59 ml/min (HCC)    ckd stage 3 b    Complication of anesthesia    1 seizure after anesthesia 1999 or 2000 none since , recent surgeries no seizures   Coronary artery disease    a. s/p prior PCI to RCA and LCx in 2009   Depression    GERD (gastroesophageal reflux disease)    Gout    no recent flares   Heart disease    High blood pressure    History of PTCA 2 2004   2 stents   History of stomach ulcers    Hypercholesteremia    Hypertension    Kidney disease    Myocardial infarction ([place]) 2004   Prostate cancer ([place])    3/12 cores upt to 40% grade 2 cancer. Planning brachytherapy (2021)   Proteinuria    Renal insufficiency    Tennis elbow    Past Surgical History:  Procedure Laterality Date   COLONOSCOPY WITH PROPOFOL N/A 10/28/2020   Procedure: COLONOSCOPY WITH PROPOFOL;  Surgeon: Harvel Quale, MD;  [facility];  Service: Gastroenterology;  Laterality: N/A;  2:30   coloscopy  10/28/2020   removed 7 polyps done at [place] N/A 11/03/2020   Procedure: CYSTOSCOPY;  Surgeon: Franchot Gallo, MD;  Location: [facility];  Service: Urology;  Laterality: N/A;   Heart catherization  2004   2 stents to heart cypher left circulflex   HERNIA REPAIR  1999   left groin bilateral   HIP SURGERY Left 1999 or 2000   bond graft   Kidney removed Right 2003 or 2004   for cancer   POLYPECTOMY  10/28/2020   Procedure: POLYPECTOMY INTESTINAL;  Surgeon: Harvel Quale, MD;  Location: [facility];  Service: Gastroenterology;;   PROSTATE BIOPSY  2021   RADIOACTIVE SEED IMPLANT N/A 11/03/2020   Procedure: RADIOACTIVE SEED IMPLANT/BRACHYTHERAPY IMPLANT;  Surgeon: Franchot Gallo, MD;  [facility]: [facility];  Service: Urology;  Laterality: N/A;  [place] N/A 11/03/2020   Procedure: SPACE OAR INSTILLATION;  Surgeon: Franchot Gallo, MD;  Location: [facility];  Service: Urology;  Laterality: N/A;    Social History:  reports that he quit smoking about 19 years ago. His smoking use included cigarettes. He started smoking about 51 years ago. He has a 48.00 pack-year smoking history. He has never used smokeless tobacco. He reports current alcohol use. He reports that he does not use drugs.   Allergies  Allergen Reactions   Other     Pt. Reports having seizures after anesthesia once 1999 or 2000 none since   Keflex [Cephalexin] Rash    Family History  Problem Relation Age of Onset   Heart disease Other    Cancer Other    Kidney disease Other    Heart disease Father    Prostate cancer Father    Heart disease Mother    Lung cancer Mother    Kidney cancer Mother    Prostate cancer Maternal Uncle    Bladder Cancer Maternal Grandfather    Breast cancer Neg Hx    Colon cancer Neg Hx    Pancreatic cancer Neg Hx      Prior to Admission medications   Medication Sig Start Date End Date Taking? Authorizing Provider  allopurinol (ZYLOPRIM) 100 MG tablet TAKE 2 TABLETS BY MOUTH DAILY. 01/13/22   [name], MD  ALPRAZolam Duanne Moron) 0.5 MG tablet TAKE (1) TABLET BY MOUTH TWICE DAILY AS NEEDED FOR ANXIETY 09/14/22   [name] Maid, FNP  aspirin 81 MG tablet Take 81 mg by mouth daily.    [provider]  atorvastatin (LIPITOR) 80 MG tablet TAKE (1) TABLET BY MOUTH ONCE DAILY. 11/18/21   [name], MD  ezetimibe (ZETIA) 10 MG tablet TAKE ONE TABLET BY MOUTH ONCE DAILY. 05/13/22   [name], MD  fluticasone (FLONASE) 50 MCG/ACT nasal spray Place 2 sprays into both nostrils daily. 01/29/19   [name], MD  furosemide (LASIX) 20 MG tablet Take 1 tablet (20 mg total) by mouth daily. 03/27/21   [name], MD  HYDROcodone-acetaminophen (NORCO/VICODIN) 5-325 MG tablet TAKE 1 TABLET BY  MOUTH (3) TIMES DAILY AS NEEDED FOR MODERATE PAIN. 09/14/22   [name] Maid, FNP  lisinopril (ZESTRIL) 20 MG tablet Take 20 mg by mouth daily.    [provider]  metoprolol tartrate (LOPRESSOR) 50 MG tablet Take 1 tablet (50 mg total) by mouth 2 (two) times daily. 12/24/20 03/24/21  Isaiah Serge, NP  NICOTROL 10 MG inhaler INHALE 1 CARTRIDGE INTO THE LUNGS AS NEEDED FOR SMOKING CESSATION. 01/07/22   [name], MD  nitroGLYCERIN (NITROSTAT) 0.4 MG SL tablet Place 1 tablet (0.4 mg total) under the tongue  every 5 (five) minutes as needed. 12/24/20   Isaiah Serge, NP  pantoprazole (PROTONIX) 40 MG tablet Take 1 tablet (40 mg total) by mouth 2 (two) times daily. 03/27/21   [name], MD  Phenazopyridine HCl (URISTAT PO) Take by mouth.    [provider]  predniSONE (DELTASONE) 10 MG tablet Take 2 tablets (20 mg total) by mouth 2 (two) times daily. 04/30/22   Veryl Speak, MD  sildenafil (VIAGRA) 100 MG tablet Take 1 tablet (100 mg total) by mouth as needed for erectile dysfunction. 07/20/22   Franchot Gallo, MD  tamsulosin (FLOMAX) 0.4 MG CAPS capsule Take 1 capsule (0.4 mg total) by mouth daily after supper. 12/09/20   Franchot Gallo, MD  tiZANidine (ZANAFLEX) 4 MG tablet Take 1 tablet (4 mg total) by mouth at bedtime as needed for muscle spasms. Do not take with alcohol or while driving or operating heavy machinery 04/28/22   Noemi Chapel A, NP  triamcinolone cream (KENALOG) 0.1 % Apply 1 application topically 2 (two) times daily. 05/11/21   Scot Jun, FNP    Physical Exam: BP (!) 165/83   Pulse 68   Temp 98.7 F (37.1 C) (Oral)   Resp 18   Ht 6' (1.829 m)   Wt 99.8 kg   SpO2 97%   BMI 29.84 kg/m   General: 61 y.o. year-old male well developed well nourished in no acute distress.  Alert and oriented x3. HEENT: NCAT, EOMI Neck: Supple, trachea medial Cardiovascular: Regular rate and rhythm with no rubs or gallops.  No thyromegaly or JVD  noted.  No lower extremity edema. 2/4 pulses in all 4 extremities. Respiratory: Clear to auscultation with no wheezes or rales. Good inspiratory effort. Abdomen: Soft, nontender nondistended with normal bowel sounds x4 quadrants. Muskuloskeletal: No cyanosis, clubbing or edema noted bilaterally Neuro: CN II-XII intact, strength 5/5 x 4, sensation, reflexes intact Skin: No ulcerative lesions noted or rashes Psychiatry: Judgement and insight appear normal. Mood is appropriate for condition and setting          Labs on Admission:  Basic Metabolic Panel: Recent Labs  Lab 09/28/22 2050  NA 138  K 3.9  CL 106  CO2 24  GLUCOSE 104*  BUN 15  CREATININE 1.35*  CALCIUM 8.7*   Liver Function Tests: No results for input(s): "AST", "ALT", "ALKPHOS", "BILITOT", "PROT", "ALBUMIN" in the last 168 hours. No results for input(s): "LIPASE", "AMYLASE" in the last 168 hours. No results for input(s): "AMMONIA" in the last 168 hours. CBC: Recent Labs  Lab 09/28/22 2050 09/29/22 0529  WBC 5.8 4.9  HGB 15.6 15.0  HCT 45.4 44.2  MCV 91.3 91.3  PLT 155 143*   Cardiac Enzymes: No results for input(s): "CKTOTAL", "CKMB", "CKMBINDEX", "TROPONINI" in the last 168 hours.  BNP (last 3 results) No results for input(s): "BNP" in the last 8760 hours.  ProBNP (last 3 results) No results for input(s): "PROBNP" in the last 8760 hours.  CBG: No results for input(s): "GLUCAP" in the last 168 hours.  Radiological Exams on Admission: DG Chest Port 1 View  Result Date: 09/28/2022 CLINICAL DATA:  Chest pain EXAM: PORTABLE CHEST 1 VIEW COMPARISON:  10/10/2020 FINDINGS: Lungs are well expanded, symmetric, and clear. No pneumothorax or pleural effusion. Cardiac size within normal limits. Pulmonary vascularity is normal. Osseous structures are age-appropriate. No acute bone abnormality. IMPRESSION: No active disease. Electronically Signed   By: Fidela Salisbury M.D.   On: 09/28/2022 21:10    EKG:  I  independently viewed the EKG done and my findings are as followed: Normal sinus rhythm at a rate of 82 bpm  Assessment/Plan Present on Admission:  Unstable angina ([place])  Essential hypertension  Principal Problem:   Unstable angina ([place]) Active Problems:   Essential hypertension   Hypertensive urgency   Mixed hyperlipidemia   GERD (gastroesophageal reflux disease)   CAD S/P percutaneous coronary angioplasty   Unstable angina Continue telemetry  Troponins x2 - 7 > 9 EKG showed Normal sinus rhythm at a rate of 82 bpm Cardiology will be consulted and we shall await further recommendations Continue heparin drip Give aspirin  Hypertensive urgency Essential hypertension Continue nitroglycerin drip and transition to home meds when BP is better controlled  Mixed hyperlipidemia Continue Lipitor  CAD s/p stent placement Continue aspirin and Lipitor  GERD Continue Protonix  History of prostate cancer s/p brachytherapy (early 2022) PSA was down from 5.3-1.1 on follow-up on 07/20/2022 Patient follows Dr. Diona Fanti   DVT prophylaxis: Heparin drip  Code Status: Full code  Family Communication: None at bedside  Consults: Cardiology  Severity of Illness: The appropriate patient status for this patient is OBSERVATION. Observation status is judged to be reasonable and necessary in order to provide the required intensity of service to ensure the patient's safety. The patient's presenting symptoms, physical exam findings, and initial radiographic and laboratory data in the context of their medical condition is felt to place them at decreased risk for further clinical deterioration. Furthermore, it is anticipated that the patient will be medically stable for discharge from the hospital within 2 midnights of admission.   Author: Bernadette Hoit, DO 09/29/2022 6:17 AM  For on call review [email or web address].

## 2022-09-29 NOTE — ED Notes (Signed)
Pt called out for his headache returning. Hospitalist made aware. No new orders at this time.

## 2022-09-29 NOTE — Consult Note (Signed)
Cardiology Consultation   Patient ID: Brandon Buck MRN: [id number]; DOB: Jan 10, 1961  Admit date: 09/28/2022 Date of Consult: 09/29/2022  PCP:  [name], MD   [facility] Providers Cardiologist: Previously Dr. Bronson Ing --> Will switch to Dr. Dellia Cloud  Patient Profile:   Brandon Buck is a 61 y.o. male with a hx of CAD (s/p prior PCI to RCA and LCx in 2009), HTN, HLD, COPD, prostate cancer (s/p seed implantation and brachytherapy) and Stage 3 CKD (prior right nephrectomy in 2003) who is being seen 09/29/2022 for the evaluation of chest pain at the request of Dr. Josephine Cables.  History of Present Illness:   Brandon Buck was last examined by myself in 12/2020 and reported baseline dyspnea on exertion in the setting of COPD but felt like symptoms had progressed and reported intermittent episodes of chest discomfort as well. A Lexiscan Myoview was recommended for further ischemic evaluation but this was never performed and he has not been evaluated by Cardiology since.  He presented to [facility] ED on 09/28/2022 for evaluation of chest pain. In talking with the patient today, he reports having progressive episodes of chest discomfort for the past 3 to 4 weeks. Reports his pain is an aching pain which occurs along his left pectoral region and radiates into his back at times.  Reports associated diaphoresis when this occurs. He has been taking nitroglycerin and utilizes over 3 tablets at a time and reports this helps with his pain. He has gone through an entire bottle within the past few weeks.  Yesterday, he developed chest pain when walking less than 50 feet and decided to seek evaluation. He denies any specific orthopnea, PND or pitting edema. Says his BP has been elevated when checked at home and that Lisinopril was previously reduced by his PCP due to hyperkalemia.  Was found to have hypertensive urgency upon arrival with BP at 211/78. He was started on IV Heparin and IV NTG  and SBP has improved into the 140's on most recent check. Initial labs show WBC 5.8, Hgb 15.6, platelets 155, Na+ 138, K+ 3.9 and creatinine 1.35 (close to baseline). Initial and repeat troponin values negative at 7 and 9. Negative for COVID and influenza. CXR with no active disease. EKG shows NSR, HR 82 with slight ST depression along the inferior and lateral leads.   Past Medical History:  Diagnosis Date   Anxiety    Arthritis    Asthma    Cancer ([place])    right renal cancer, right radical nephrectomy 2004   Chronic back pain    Chronic hip pain, left    CKD (chronic kidney disease) stage 3, GFR 30-59 ml/min (HCC)    ckd stage 3 b    Complication of anesthesia    1 seizure after anesthesia 1999 or 2000 none since , recent surgeries no seizures   Coronary artery disease    a. s/p prior PCI to RCA and LCx in 2009   Depression    GERD (gastroesophageal reflux disease)    Gout    no recent flares   Heart disease    High blood pressure    History of PTCA 2 2004   2 stents   History of stomach ulcers    Hypercholesteremia    Hypertension    Kidney disease    Myocardial infarction ([place]) 2004   Prostate cancer ([place])    3/12 cores upt to 40% grade 2 cancer. Planning brachytherapy (2021)  Proteinuria    Renal insufficiency    Tennis elbow     Past Surgical History:  Procedure Laterality Date   COLONOSCOPY WITH PROPOFOL N/A 10/28/2020   Procedure: COLONOSCOPY WITH PROPOFOL;  Surgeon: Harvel Quale, MD;  [facility];  Service: Gastroenterology;  Laterality: N/A;  2:30   coloscopy  10/28/2020   removed 7 polyps done at [place] N/A 11/03/2020   Procedure: CYSTOSCOPY;  Surgeon: Franchot Gallo, MD;  Location: [facility];  Service: Urology;  Laterality: N/A;   Heart catherization  2004   2 stents to heart cypher left circulflex   HERNIA REPAIR  1999   left groin bilateral   HIP SURGERY Left 1999 or 2000   bond graft   Kidney  removed Right 2003 or 2004   for cancer   POLYPECTOMY  10/28/2020   Procedure: POLYPECTOMY INTESTINAL;  Surgeon: Harvel Quale, MD;  Location: [facility];  Service: Gastroenterology;;   PROSTATE BIOPSY  2021   RADIOACTIVE SEED IMPLANT N/A 11/03/2020   Procedure: RADIOACTIVE SEED IMPLANT/BRACHYTHERAPY IMPLANT;  Surgeon: Franchot Gallo, MD;  Location: [facility];  Service: Urology;  Laterality: N/A;  [place] N/A 11/03/2020   Procedure: SPACE OAR INSTILLATION;  Surgeon: Franchot Gallo, MD;  Location: [facility];  Service: Urology;  Laterality: N/A;     Home Medications:  Prior to Admission medications   Medication Sig Start Date End Date Taking? Authorizing Provider  ALPRAZolam Duanne Moron) 0.5 MG tablet TAKE (1) TABLET BY MOUTH TWICE DAILY AS NEEDED FOR ANXIETY 09/14/22  Yes [name] Maid, FNP  aspirin 81 MG tablet Take 81 mg by mouth daily.   Yes [provider]  HYDROcodone-acetaminophen (NORCO/VICODIN) 5-325 MG tablet TAKE 1 TABLET BY MOUTH (3) TIMES DAILY AS NEEDED FOR MODERATE PAIN. 09/14/22  Yes [name] Maid, FNP  nitroGLYCERIN (NITROSTAT) 0.4 MG SL tablet Place 1 tablet (0.4 mg total) under the tongue every 5 (five) minutes as needed. 12/24/20  Yes Isaiah Serge, NP  sildenafil (VIAGRA) 100 MG tablet Take 1 tablet (100 mg total) by mouth as needed for erectile dysfunction. 07/20/22  Yes Dahlstedt, Annie Main, MD  allopurinol (ZYLOPRIM) 100 MG tablet TAKE 2 TABLETS BY MOUTH DAILY. Patient not taking: Reported on 09/29/2022 01/13/22   [name], MD  atorvastatin (LIPITOR) 80 MG tablet TAKE (1) TABLET BY MOUTH ONCE DAILY. Patient not taking: Reported on 09/29/2022 11/18/21   [name], MD  ezetimibe (ZETIA) 10 MG tablet TAKE ONE TABLET BY MOUTH ONCE DAILY. Patient not taking: Reported on 09/29/2022 05/13/22   [name], MD  fluticasone [facility]) 50 MCG/ACT nasal spray Place 2 sprays into both  nostrils daily. Patient not taking: Reported on 09/29/2022 01/29/19   [name], MD  furosemide (LASIX) 20 MG tablet Take 1 tablet (20 mg total) by mouth daily. Patient not taking: Reported on 09/29/2022 03/27/21   [name], MD  lisinopril (ZESTRIL) 20 MG tablet Take 20 mg by mouth daily. Patient not taking: Reported on 09/29/2022    [provider]  metoprolol tartrate (LOPRESSOR) 50 MG tablet Take 1 tablet (50 mg total) by mouth 2 (two) times daily. 12/24/20 03/24/21  Isaiah Serge, NP  NICOTROL 10 MG inhaler INHALE 1 CARTRIDGE INTO THE LUNGS AS NEEDED FOR SMOKING CESSATION. 01/07/22   [name], MD  pantoprazole (PROTONIX) 40 MG tablet Take 1 tablet (40 mg total)  by mouth 2 (two) times daily. Patient not taking: Reported on 09/29/2022 03/27/21   [name], MD  predniSONE (DELTASONE) 10 MG tablet Take 2 tablets (20 mg total) by mouth 2 (two) times daily. Patient not taking: Reported on 09/29/2022 04/30/22   Veryl Speak, MD  tamsulosin (FLOMAX) 0.4 MG CAPS capsule Take 1 capsule (0.4 mg total) by mouth daily after supper. Patient not taking: Reported on 09/29/2022 12/09/20   Franchot Gallo, MD  tiZANidine (ZANAFLEX) 4 MG tablet Take 1 tablet (4 mg total) by mouth at bedtime as needed for muscle spasms. Do not take with alcohol or while driving or operating heavy machinery Patient not taking: Reported on 09/29/2022 04/28/22   Eulogio Bear, NP  triamcinolone cream (KENALOG) 0.1 % Apply 1 application topically 2 (two) times daily. Patient not taking: Reported on 09/29/2022 05/11/21   Scot Jun, FNP    Inpatient Medications: Scheduled Meds:  Continuous Infusions:  heparin 1,200 Units/hr (09/28/22 2125)   nitroGLYCERIN 15 mcg/min (09/28/22 2203)   PRN Meds: acetaminophen, morphine injection  Allergies:    Allergies  Allergen Reactions   Other     Pt. Reports having seizures after anesthesia once 1999 or 2000 none since   Keflex [Cephalexin]  Rash    Social History:   Social History   Socioeconomic History   Marital status: Married    Spouse name: Sheree   Number of children: 0   Years of education: Not on file   Highest education level: Not on file  Occupational History   Occupation: retired  Tobacco Use   Smoking status: Former    Packs/day: 1.50    Years: 32.00    Total pack years: 48.00    Types: Cigarettes    Start date: 01/26/1971    Quit date: 01/26/2003    Years since quitting: 19.6   Smokeless tobacco: Never  Vaping Use   Vaping Use: Never used  Substance and Sexual Activity   Alcohol use: Yes    Alcohol/week: 0.0 standard drinks of alcohol    Comment: occ 2 week   Drug use: No   Sexual activity: Yes  Other Topics Concern   Not on file  Social History Narrative   Worked as an Clinical biochemist.    Married x 45 years in 2022.   Social Determinants of Health   Financial Resource Strain: Low Risk  (07/24/2021)   Overall Financial Resource Strain (CARDIA)    Difficulty of Paying Living Expenses: Not hard at all  Food Insecurity: No Food Insecurity (07/24/2021)   Hunger Vital Sign    Worried About Running Out of Food in the Last Year: Never true    Ran Out of Food in the Last Year: Never true  Transportation Needs: No Transportation Needs (07/24/2021)   PRAPARE - Hydrologist (Medical): No    Lack of Transportation (Non-Medical): No  Physical Activity: Sufficiently Active (07/24/2021)   Exercise Vital Sign    Days of Exercise per Week: 5 days    Minutes of Exercise per Session: 30 min  Stress: No Stress Concern Present (07/24/2021)   [place]    Feeling of Stress : Not at all  Social Connections: [place] (07/24/2021)   Social Connection and Isolation Panel [NHANES]    Frequency of Communication with Friends and Family: More than three times a week    Frequency of Social Gatherings with Friends and  Family: Three  times a week    Attends Religious Services: 1 to 4 times per year    Active Member of Clubs or Organizations: No    Attends Archivist Meetings: 1 to 4 times per year    Marital Status: Married  Human resources officer Violence: Not At Risk (07/24/2021)   Humiliation, Afraid, Rape, and Kick questionnaire    Fear of Current or Ex-Partner: No    Emotionally Abused: No    Physically Abused: No    Sexually Abused: No    Family History:    Family History  Problem Relation Age of Onset   Heart disease Other    Cancer Other    Kidney disease Other    Heart disease Father    Prostate cancer Father    Heart disease Mother    Lung cancer Mother    Kidney cancer Mother    Prostate cancer Maternal Uncle    Bladder Cancer Maternal Grandfather    Breast cancer Neg Hx    Colon cancer Neg Hx    Pancreatic cancer Neg Hx      ROS:  Please see the history of present illness.   All other ROS reviewed and negative.     Physical Exam/Data:   Vitals:   09/29/22 0330 09/29/22 0430 09/29/22 0500 09/29/22 0801  BP: (!) 151/80 (!) 165/83  (!) 147/99  Pulse: (!) 57 61 68 66  Resp: '18 18  16  '$ Temp:    (!) 96.8 F (36 C)  TempSrc:    Axillary  SpO2: 99% 97% 97% 100%  Weight:      Height:       No intake or output data in the 24 hours ending 09/29/22 0916    09/28/2022    8:44 PM 07/28/2022    9:28 AM 07/20/2022   10:38 AM  Last 3 Weights  Weight (lbs) 220 lb 213 lb 213 lb  Weight (kg) 99.791 kg 96.616 kg 96.616 kg     Body mass index is 29.84 kg/m.  General:  Well nourished, well developed male appearing in no acute distress HEENT: normal Neck: no JVD Vascular: No carotid bruits; Distal pulses 2+ bilaterally Cardiac:  normal S1, S2; RRR; no murmur Lungs:  clear to auscultation bilaterally, no wheezing, rhonchi or rales  Abd: soft, nontender, no hepatomegaly  Ext: no pitting edema Musculoskeletal:  No deformities, BUE and BLE strength normal and equal Skin: warm  and dry  Neuro:  CNs 2-12 intact, no focal abnormalities noted Psych:  Normal affect   EKG:  The EKG was personally reviewed and demonstrates: NSR, HR 82 with slight ST depression along inferior and lateral leads.   Relevant CV Studies:  Cardiac Catheterization: 2009  CONCLUSION:  1. Successful percutaneous transluminal angioplasty with placement of      a Cypher 2.75 x 18-mm stent in the mid left circumflex stenotic      lesion.  2. Successful percutaneous transluminal balloon angioplasty with      placement of a 2.75 x 23-mm Cypher of the distal RCA, ultimately      postdilated to 3 mm.  3. Adjuvant use of Angiomax infusion.  NST: 05/2016 Equivocal ST segment changes, mainly lead II, III, and aVF, less than 1 mm. Intermediate Duke treadmill score of 0.5. Blood pressure demonstrated a hypertensive response to exercise. Small, mild intensity, fixed mid to basal inferior defect that is more prominent at rest and consistent with soft tissue attenuation. This is a low risk study based  on perfusion imaging. Nuclear stress EF: 71%.  Echocardiogram: 12/2020 IMPRESSIONS     1. Left ventricular ejection fraction, by estimation, is 65 to 70%. The  left ventricle has normal function. The left ventricle has no regional  wall motion abnormalities. Left ventricular diastolic parameters were  normal.   2. Right ventricular systolic function is normal. The right ventricular  size is normal. There is normal pulmonary artery systolic pressure.   3. Left atrial size was mildly dilated.   4. The mitral valve is normal in structure. Trivial mitral valve  regurgitation. No evidence of mitral stenosis.   5. The aortic valve is tricuspid. Aortic valve regurgitation is not  visualized. No aortic stenosis is present.   6. The inferior vena cava is normal in size with greater than 50%  respiratory variability, suggesting right atrial pressure of 3 mmHg.   Laboratory Data:  High Sensitivity  Troponin:   Recent Labs  Lab 09/28/22 2050 09/28/22 2258  TROPONINIHS 7 9     Chemistry Recent Labs  Lab 09/28/22 2050  NA 138  K 3.9  CL 106  CO2 24  GLUCOSE 104*  BUN 15  CREATININE 1.35*  CALCIUM 8.7*  GFRNONAA 59*  ANIONGAP 8    No results for input(s): "PROT", "ALBUMIN", "AST", "ALT", "ALKPHOS", "BILITOT" in the last 168 hours. Lipids No results for input(s): "CHOL", "TRIG", "HDL", "LABVLDL", "LDLCALC", "CHOLHDL" in the last 168 hours.  Hematology Recent Labs  Lab 09/28/22 2050 09/29/22 0529  WBC 5.8 4.9  RBC 4.97 4.84  HGB 15.6 15.0  HCT 45.4 44.2  MCV 91.3 91.3  MCH 31.4 31.0  MCHC 34.4 33.9  RDW 12.8 12.9  PLT 155 143*   Thyroid No results for input(s): "TSH", "FREET4" in the last 168 hours.  BNPNo results for input(s): "BNP", "PROBNP" in the last 168 hours.  DDimer No results for input(s): "DDIMER" in the last 168 hours.   Radiology/Studies:  DG Chest Port 1 View  Result Date: 09/28/2022 CLINICAL DATA:  Chest pain EXAM: PORTABLE CHEST 1 VIEW COMPARISON:  10/10/2020 FINDINGS: Lungs are well expanded, symmetric, and clear. No pneumothorax or pleural effusion. Cardiac size within normal limits. Pulmonary vascularity is normal. Osseous structures are age-appropriate. No acute bone abnormality. IMPRESSION: No active disease. Electronically Signed   By: Fidela Salisbury M.D.   On: 09/28/2022 21:10     Assessment and Plan:   1. Chest Pain concerning for Accelerating Angina - Presents with progressive chest pain for the past 3-4 weeks and has been using SL NTG with resolution of his symptoms but has now used an entire bottle. Has been pain-free since being started on IV Heparin and IV NTG (headache has limited further titration).  - Initial and repeat troponin values negative at 7 and 9. EKG shows NSR, HR 82 with slight ST depression along the inferior and lateral leads which has been noted on prior tracings.  - Will review with Dr. Dellia Cloud but anticipate a  cardiac catheterization for definitive evaluation given his progressive symptoms. Reviewed with the patient and he is in agreement to proceed if indicated. The patient understands that risks include but are not limited to stroke (1 in 1000), death (1 in 39), kidney failure [usually temporary] (1 in 500), bleeding (1 in 200), allergic reaction [possibly serious] (1 in 200).   - Continue IV Heparin, IV NTG, ASA, Atorvastatin, and Zetia. Will restart Lopressor.   Addendum: Added to the cath board for later today. Will be on the Cardiology  service once at [facility]. Cath Lab, [facility] and the Card Master have been updated.   2. CAD - He underwent prior stenting to the RCA and LCx in 2009 as outlined above and most recent ischemic evaluation was a low-risk NST in 2017. Will plan for repeat ischemic evaluation this admission.  - Continue IV, Heparin, IV NTG, ASA '81mg'$  daily, Atorvastatin '80mg'$  daily and Zetia '10mg'$  daily. Will restart Lopressor but at a lower dose of '25mg'$  BID given his HR in the 60's at times.   3. Hypertensive Urgency - His BP was at 211/78 upon arrival to the ED and has improved with IV NTG with SBP currently in the 140's. Would not further titrate given his headache and do not want to reduce BP too quickly as he reports it has been significantly elevated for several weeks. Will restart Lopressor. Hold on restarting Lisinopril until after his catheterization.   4. HLD - Will recheck an FLP. He remains on Atorvastatin '80mg'$  daily and Zetia '10mg'$  daily.   5. History of Prostate Cancer - Previously underwent seed implantation and brachytherapy and says he is now in surveillance and has follow-up on an annual basis.   6. Stage 3 CKD  - He is s/p right nephrectomy in 2003. Baseline creatinine 1.4-  1.5, improved to 1.35 on admission. Will hydrate prior to his cath.     For questions or updates, please contact [place] Please consult [email or web address] for contact info under     Signed, Erma Heritage, PA-C  09/29/2022 9:16 AM

## 2022-09-29 NOTE — Progress Notes (Signed)
ANTICOAGULATION CONSULT NOTE -   Pharmacy Consult for heparin Indication: chest pain/ACS  Allergies  Allergen Reactions   Other     Pt. Reports having seizures after anesthesia once 1999 or 2000 none since   Keflex [Cephalexin] Rash    Patient Measurements: Height: 6' (182.9 cm) Weight: 99.8 kg (220 lb) IBW/kg (Calculated) : 77.6 Heparin Dosing Weight: 97.8 kg  Vital Signs: Temp: 98.7 F (37.1 C) (12/05 2223) Temp Source: Oral (12/05 2223) BP: 165/83 (12/06 0430) Pulse Rate: 68 (12/06 0500)  Labs: Recent Labs    09/28/22 2050 09/28/22 2258 09/29/22 0529  HGB 15.6  --  15.0  HCT 45.4  --  44.2  PLT 155  --  143*  HEPARINUNFRC  --   --  0.38  CREATININE 1.35*  --   --   TROPONINIHS 7 9  --     Estimated Creatinine Clearance: 69.4 mL/min (A) (by C-G formula based on SCr of 1.35 mg/dL (H)).   Medical History: Past Medical History:  Diagnosis Date   Anxiety    Arthritis    Asthma    Cancer ([place])    right renal cancer, right radical nephrectomy 2004   Chronic back pain    Chronic hip pain, left    CKD (chronic kidney disease) stage 3, GFR 30-59 ml/min (HCC)    ckd stage 3 b    Complication of anesthesia    1 seizure after anesthesia 1999 or 2000 none since , recent surgeries no seizures   Coronary artery disease    a. s/p prior PCI to RCA and LCx in 2009   Depression    GERD (gastroesophageal reflux disease)    Gout    no recent flares   Heart disease    High blood pressure    History of PTCA 2 2004   2 stents   History of stomach ulcers    Hypercholesteremia    Hypertension    Kidney disease    Myocardial infarction ([place]) 2004   Prostate cancer ([place])    3/12 cores upt to 40% grade 2 cancer. Planning brachytherapy (2021)   Proteinuria    Renal insufficiency    Tennis elbow     Medications:  (Not in a hospital admission) Scheduled:     Assessment: Patient admitted with persistent chest pain/ACS. No anticoagulation PTA. No baseline CBC or  CMP.   HL 0.38- therapeutic CBC WNL  Goal of Therapy:  Heparin level 0.3-0.7 units/ml Monitor platelets by anticoagulation protocol: Yes   Plan:  Continue heparin infusion at 1200 units/hr  Check anti-Xa level in 6 hours and daily while on heparin Continue to monitor H&H and platelets  Margot Ables, PharmD Clinical Pharmacist 09/29/2022 7:39 AM

## 2022-09-29 NOTE — ED Notes (Signed)
Pt called out stating that he wants to leave and go home. This RN messaged hospitalist about this and waiting to hear back.

## 2022-09-30 ENCOUNTER — Encounter (HOSPITAL_COMMUNITY): Payer: Self-pay | Admitting: Interventional Cardiology

## 2022-09-30 ENCOUNTER — Inpatient Hospital Stay (HOSPITAL_COMMUNITY): Payer: PPO

## 2022-09-30 ENCOUNTER — Other Ambulatory Visit: Payer: Self-pay | Admitting: Physician Assistant

## 2022-09-30 DIAGNOSIS — N1832 Chronic kidney disease, stage 3b: Secondary | ICD-10-CM

## 2022-09-30 DIAGNOSIS — R079 Chest pain, unspecified: Secondary | ICD-10-CM

## 2022-09-30 LAB — ECHOCARDIOGRAM COMPLETE
AR max vel: 2.95 cm2
AV Area VTI: 2.94 cm2
AV Area mean vel: 2.73 cm2
AV Mean grad: 5 mmHg
AV Peak grad: 8.6 mmHg
Ao pk vel: 1.47 m/s
Area-P 1/2: 3.65 cm2
Height: 72 in
Weight: 3520 oz

## 2022-09-30 LAB — LIPID PANEL
Cholesterol: 182 mg/dL (ref 0–200)
HDL: 40 mg/dL — ABNORMAL LOW (ref >40)
LDL Cholesterol: 117 mg/dL — ABNORMAL HIGH (ref 0–99)
Total CHOL/HDL Ratio: 4.6 RATIO
Triglycerides: 123 mg/dL (ref <150)
VLDL: 25 mg/dL (ref 0–40)

## 2022-09-30 LAB — CBC
HCT: 43.4 % (ref 39.0–52.0)
Hemoglobin: 14.7 g/dL (ref 13.0–17.0)
MCH: 30.9 pg (ref 26.0–34.0)
MCHC: 33.9 g/dL (ref 30.0–36.0)
MCV: 91.2 fL (ref 80.0–100.0)
Platelets: 150 10*3/uL (ref 150–400)
RBC: 4.76 MIL/uL (ref 4.22–5.81)
RDW: 12.8 % (ref 11.5–15.5)
WBC: 5.2 10*3/uL (ref 4.0–10.5)
nRBC: 0 % (ref 0.0–0.2)

## 2022-09-30 LAB — BASIC METABOLIC PANEL
Anion gap: 7 (ref 5–15)
BUN: 13 mg/dL (ref 8–23)
CO2: 25 mmol/L (ref 22–32)
Calcium: 8.9 mg/dL (ref 8.9–10.3)
Chloride: 105 mmol/L (ref 98–111)
Creatinine, Ser: 1.67 mg/dL — ABNORMAL HIGH (ref 0.61–1.24)
GFR, Estimated: 46 mL/min — ABNORMAL LOW (ref >60)
Glucose, Bld: 87 mg/dL (ref 70–99)
Potassium: 4.1 mmol/L (ref 3.5–5.1)
Sodium: 137 mmol/L (ref 135–145)

## 2022-09-30 LAB — MAGNESIUM: Magnesium: 1.7 mg/dL (ref 1.7–2.4)

## 2022-09-30 MED ORDER — NITROGLYCERIN 0.4 MG SL SUBL: 0.4000 mg | SUBLINGUAL_TABLET | SUBLINGUAL | 3 refills | Status: DC | PRN

## 2022-09-30 NOTE — Discharge Summary (Signed)
Discharge Summary    Patient ID: Brandon Buck MRN: [id number]; DOB: [birth year]  Admit date: 09/28/2022 Discharge date: 09/30/2022  PCP:  [name], MD   [place] Providers Cardiologist:  Chalmers Guest, MD      Discharge Diagnoses    Principal Problem:   Unstable angina [facility]) Active Problems:   CKD (chronic kidney disease) stage 3, GFR 30-59 ml/min ([place])   Essential hypertension   Hypertensive urgency   Other hyperlipidemia   GERD (gastroesophageal reflux disease)   CAD S/P percutaneous coronary angioplasty   Hypertensive emergency without congestive heart failure   Diagnostic Studies/Procedures    Left heart cath 09/29/22:   Prox RCA to Mid RCA lesion is 30% stenosed.   Previously placed Dist RCA stent of unknown type is  widely patent.   Previously placed Prox Cx to Mid Cx stent of unknown type is  widely patent.   The left ventricular systolic function is normal.   LV end diastolic pressure is normal.   The left ventricular ejection fraction is 55-65% by visual estimate.   There is no aortic valve stenosis.   Mild tortuosity in the right subclavian which makes access to the ascending aorta somewhat difficult.   Patent stents in the RCA and circumflex.  Nonobstructive, mild disease diffusely.  Continue aggressive secondary prevention including blood pressure control.  The patient states that his blood pressure at home is often over 941 mmHg systolic.  I stressed the importance of risk factor modification including blood pressure control.   _____________   Echo 09/30/22:  1. Left ventricular ejection fraction, by estimation, is 60 to 65%. The  left ventricle has normal function. The left ventricle has no regional  wall motion abnormalities. Left ventricular diastolic parameters are  indeterminate.   2. Right ventricular systolic function is normal. The right ventricular  size is mildly enlarged.   3. The mitral valve is normal in  structure. No evidence of mitral valve  regurgitation. No evidence of mitral stenosis.   4. The aortic valve is normal in structure. Aortic valve regurgitation is  not visualized. Aortic valve sclerosis is present, with no evidence of  aortic valve stenosis.   5. The inferior vena cava is normal in size with greater than 50%  respiratory variability, suggesting right atrial pressure of 3 mmHg.   History of Present Illness     Brandon Buck is a 61 y.o. male with  with a hx of CAD (s/p prior PCI to RCA and LCx in 2009), HTN, HLD, COPD, prostate cancer (s/p seed implantation and brachytherapy) and Stage 3 CKD (prior right nephrectomy in 2003) who was seen 09/29/2022 for the evaluation of chest pain.   Brandon Buck was last examined in 12/2020 and reported baseline dyspnea on exertion in the setting of COPD but felt like symptoms had progressed and reported intermittent episodes of chest discomfort as well. A Lexiscan Myoview was recommended for further ischemic evaluation but this was never performed and he has not been evaluated by Cardiology since.   He presented to [facility] ED on 09/28/2022 for evaluation of chest pain. In talking with the patient today, he reports having progressive episodes of chest discomfort for the past 3 to 4 weeks. Reports his pain is an aching pain which occurs along his left pectoral region and radiates into his back at times.  Reports associated diaphoresis when this occurs. He has been taking nitroglycerin and utilizes over 3 tablets at a time  and reports this helps with his pain. He has gone through an entire bottle within the past few weeks.  Yesterday, he developed chest pain when walking less than 50 feet and decided to seek evaluation. He denies any specific orthopnea, PND or pitting edema. Says his BP has been elevated when checked at home and that Lisinopril was previously reduced by his PCP due to hyperkalemia.   Was found to have hypertensive urgency upon arrival  with BP at 211/78. He was started on IV Heparin and IV NTG and SBP has improved into the 140's on most recent check. Initial labs show WBC 5.8, Hgb 15.6, platelets 155, Na+ 138, K+ 3.9 and creatinine 1.35 (close to baseline). Initial and repeat troponin values negative at 7 and 9. Negative for COVID and influenza. CXR with no active disease. EKG shows NSR, HR 82 with slight ST depression along the inferior and lateral leads.   Hospital Course     Consultants: none  CAD Chest pain concerning for angina He was transferred to [facility] for cardiac catheterization. He has prior stenting in the RCA and LCX 2009. Heart cath 09/29/22 revealed patent stents in the RCA and LCX with mild nonobstructive disease that will be treated medically. Troponin remained negative.  He tolerated the procedure well and has ambulated in the hall. Continue 81 mg ASA, PRN nitro, statin, zetia, lisinopril, lopressor   Hypertension Hypertensive emergency Reported SBP over 200 mmHg at home. Presented with BP 197/158 initially managed on cardene drip.  Now back on home medications - BP in the 130s Will not add medications at this time, but may need better pressure control at home. Advised to follow BP at home. He states he thinks he is taking 20 mg lisinopril, dose was lowered due to hyperkalemia. I have asked that he see his nephrology in the next couple of weeks with BP log.    Hyperlipidemia with LDL goal < 70 09/30/2022: Cholesterol 182; HDL 40; LDL Cholesterol 117; Triglycerides 123; VLDL 25 Continue 80 mg lipitor and zetia LP (a) pending   CKD stage III s/p right nephrectomy 2003 Baseline creatinine 1.4-1.5 Received IVF hydration pre-cath Discharge sCr 1.67 Continue to hydrate overnight BMP on Monday - will see nephrology in the next 2 weeks   Pt seen and examined by Dr. Harl Bowie and deemed stable for discharge. I will message the office to arrange cardiology follow up.    Did the patient have an acute coronary  syndrome (MI, NSTEMI, STEMI, etc) this admission?:  No                               Did the patient have a percutaneous coronary intervention (stent / angioplasty)?:  No.        The patient will be scheduled for a TOC follow up appointment in 7-14 days.  A message has been sent to the [facility] and Scheduling Pool at the office where the patient should be seen for follow up.  _____________  Discharge Vitals Blood pressure 135/65, pulse (!) 54, temperature 98.2 F (36.8 C), temperature source Oral, resp. rate 18, height 6' (1.829 m), weight 99.8 kg, SpO2 97 %.  Filed Weights   09/28/22 2044  Weight: 99.8 kg    Labs & Radiologic Studies    CBC Recent Labs    09/29/22 0529 09/30/22 0256  WBC 4.9 5.2  HGB 15.0 14.7  HCT 44.2 43.4  MCV 91.3 91.2  PLT 143* 353   Basic Metabolic Panel Recent Labs    09/29/22 1302 09/30/22 0256  NA 137 137  K 4.0 4.1  CL 104 105  CO2 25 25  GLUCOSE 84 87  BUN 13 13  CREATININE 1.38* 1.67*  CALCIUM 8.9 8.9  MG  --  1.7   Liver Function Tests No results for input(s): "AST", "ALT", "ALKPHOS", "BILITOT", "PROT", "ALBUMIN" in the last 72 hours. No results for input(s): "LIPASE", "AMYLASE" in the last 72 hours. High Sensitivity Troponin:   Recent Labs  Lab 09/28/22 2050 09/28/22 2258  TROPONINIHS 7 9    BNP Invalid input(s): "POCBNP" D-Dimer No results for input(s): "DDIMER" in the last 72 hours. Hemoglobin A1C No results for input(s): "HGBA1C" in the last 72 hours. Fasting Lipid Panel Recent Labs    09/30/22 0256  CHOL 182  HDL 40*  LDLCALC 117*  TRIG 123  CHOLHDL 4.6   Thyroid Function Tests No results for input(s): "TSH", "T4TOTAL", "T3FREE", "THYROIDAB" in the last 72 hours.  Invalid input(s): "FREET3" _____________  ECHOCARDIOGRAM COMPLETE  Result Date: 09/30/2022    ECHOCARDIOGRAM REPORT   Patient Name:   DARALD UZZLE Date of Exam: 09/30/2022 Medical Rec #:  [id number]       Height:       72.0 in Accession #:     [id number]      Weight:       220.0 lb Date of Birth:  07-Jan-1961        BSA:          2.219 m Patient Age:    71 years        BP:           135/65 mmHg Patient Gender: M               HR:           54 bpm. Exam Location:  Inpatient Procedure: 2D Echo, Cardiac Doppler and Color Doppler Indications:    Chest Pain R07.9  History:        Patient has prior history of Echocardiogram examinations, most                 recent 01/14/2021. Angina and CAD; Risk Factors:Hypertension. CKD                 stage 3.  Sonographer:    Ronny Flurry Referring Phys: [place]  1. Left ventricular ejection fraction, by estimation, is 60 to 65%. The left ventricle has normal function. The left ventricle has no regional wall motion abnormalities. Left ventricular diastolic parameters are indeterminate.  2. Right ventricular systolic function is normal. The right ventricular size is mildly enlarged.  3. The mitral valve is normal in structure. No evidence of mitral valve regurgitation. No evidence of mitral stenosis.  4. The aortic valve is normal in structure. Aortic valve regurgitation is not visualized. Aortic valve sclerosis is present, with no evidence of aortic valve stenosis.  5. The inferior vena cava is normal in size with greater than 50% respiratory variability, suggesting right atrial pressure of 3 mmHg. FINDINGS  Left Ventricle: Left ventricular ejection fraction, by estimation, is 60 to 65%. The left ventricle has normal function. The left ventricle has no regional wall motion abnormalities. The left ventricular internal cavity size was normal in size. There is  no left ventricular hypertrophy. Left ventricular diastolic parameters are indeterminate. Right Ventricle: The right ventricular size is mildly enlarged. No  increase in right ventricular wall thickness. Right ventricular systolic function is normal. Left Atrium: Left atrial size was normal in size. Right Atrium: Right atrial size was  normal in size. Pericardium: There is no evidence of pericardial effusion. Mitral Valve: The mitral valve is normal in structure. No evidence of mitral valve regurgitation. No evidence of mitral valve stenosis. Tricuspid Valve: The tricuspid valve is normal in structure. Tricuspid valve regurgitation is not demonstrated. No evidence of tricuspid stenosis. Aortic Valve: The aortic valve is normal in structure. Aortic valve regurgitation is not visualized. Aortic valve sclerosis is present, with no evidence of aortic valve stenosis. Aortic valve mean gradient measures 5.0 mmHg. Aortic valve peak gradient measures 8.6 mmHg. Aortic valve area, by VTI measures 2.94 cm. Pulmonic Valve: The pulmonic valve was normal in structure. Pulmonic valve regurgitation is not visualized. No evidence of pulmonic stenosis. Aorta: The aortic root is normal in size and structure. Venous: The inferior vena cava is normal in size with greater than 50% respiratory variability, suggesting right atrial pressure of 3 mmHg. IAS/Shunts: No atrial level shunt detected by color flow Doppler.  LEFT VENTRICLE PLAX 2D LVOT diam:     2.10 cm   Diastology LV SV:         101       LV e' medial:    6.53 cm/s LV SV Index:   45        LV E/e' medial:  12.5 LVOT Area:     3.46 cm  LV e' lateral:   6.95 cm/s                          LV E/e' lateral: 11.7  RIGHT VENTRICLE             IVC RV Basal diam:  4.60 cm     IVC diam: 1.90 cm RV Mid diam:    1.90 cm RV S prime:     11.60 cm/s TAPSE (M-mode): 2.4 cm LEFT ATRIUM             Index        RIGHT ATRIUM           Index LA Vol (A2C):   55.5 ml 25.02 ml/m  RA Area:     16.20 cm LA Vol (A4C):   52.0 ml 23.44 ml/m  RA Volume:   42.10 ml  18.98 ml/m LA Biplane Vol: 54.2 ml 24.43 ml/m  AORTIC VALVE AV Area (Vmax):    2.95 cm AV Area (Vmean):   2.73 cm AV Area (VTI):     2.94 cm AV Vmax:           147.00 cm/s AV Vmean:          99.400 cm/s AV VTI:            0.343 m AV Peak Grad:      8.6 mmHg AV Mean  Grad:      5.0 mmHg LVOT Vmax:         125.00 cm/s LVOT Vmean:        78.300 cm/s LVOT VTI:          0.291 m LVOT/AV VTI ratio: 0.85  AORTA Ao Root diam: 3.10 cm Ao Asc diam:  3.30 cm MITRAL VALVE MV Area (PHT): 3.65 cm    SHUNTS MV Decel Time: 208 msec    Systemic VTI:  0.29 m MV E velocity: 81.30 cm/s  Systemic Diam:  2.10 cm MV A velocity: 82.70 cm/s MV E/A ratio:  0.98 Kardie Tobb DO Electronically signed by Berniece Salines DO Signature Date/Time: 09/30/2022/11:03:03 AM    Final    CARDIAC CATHETERIZATION  Result Date: 09/29/2022   Prox RCA to Mid RCA lesion is 30% stenosed.   Previously placed Dist RCA stent of unknown type is  widely patent.   Previously placed Prox Cx to Mid Cx stent of unknown type is  widely patent.   The left ventricular systolic function is normal.   LV end diastolic pressure is normal.   The left ventricular ejection fraction is 55-65% by visual estimate.   There is no aortic valve stenosis.   Mild tortuosity in the right subclavian which makes access to the ascending aorta somewhat difficult. Patent stents in the RCA and circumflex.  Nonobstructive, mild disease diffusely.  Continue aggressive secondary prevention including blood pressure control.  The patient states that his blood pressure at home is often over 892 mmHg systolic.  I stressed the importance of risk factor modification including blood pressure control.   DG Chest Port 1 View  Result Date: 09/28/2022 CLINICAL DATA:  Chest pain EXAM: PORTABLE CHEST 1 VIEW COMPARISON:  10/10/2020 FINDINGS: Lungs are well expanded, symmetric, and clear. No pneumothorax or pleural effusion. Cardiac size within normal limits. Pulmonary vascularity is normal. Osseous structures are age-appropriate. No acute bone abnormality. IMPRESSION: No active disease. Electronically Signed   By: Fidela Salisbury M.D.   On: 09/28/2022 21:10   Disposition   Pt is being discharged home today in good condition.  Follow-up Plans & Appointments      Follow-up Information     Mallipeddi, Vishnu P, MD Follow up in 2 day(s).   Specialties: Cardiology, Internal Medicine Contact information: [address] [place] [address] [phone]         [facility] [place]. [facility] Follow up on 10/04/2022.   Specialty: Cardiology Why: please present for BMP lab. You do not need to be fasting. Contact information: [address] [id number] [place] [address] [phone]               Discharge Instructions     Diet - low sodium heart healthy   Complete by: As directed    Discharge instructions   Complete by: As directed    No driving for 2 days. No lifting over 5 lbs for 1 week. No sexual activity for 1 week. Keep procedure site clean & dry. If you notice increased pain, swelling, bleeding or pus, call/return!  You may shower, but no soaking baths/hot tubs/pools for 1 week.   Keep a BP log 2 hrs after morning medications and after breakfast.   Please see nephrology - kidney doctor - in the next 2 weeks.  Present to cardiology or PCP for lab work on Monday to check renal function.   Increase activity slowly   Complete by: As directed         Discharge Medications   Allergies as of 09/30/2022       Reactions   Other    Pt. Reports having seizures after anesthesia once 1999 or 2000 none since   Keflex [cephalexin] Rash        Medication List     STOP taking these medications    fluticasone 50 MCG/ACT nasal spray Commonly known as: FLONASE   furosemide 20 MG tablet Commonly known as: LASIX  triamcinolone cream 0.1 % Commonly known as: KENALOG       TAKE these medications    allopurinol 100 MG tablet Commonly known as: ZYLOPRIM TAKE 2 TABLETS BY MOUTH DAILY.   ALPRAZolam 0.5 MG tablet Commonly known as: XANAX TAKE (1) TABLET BY MOUTH TWICE DAILY AS NEEDED FOR ANXIETY   aspirin 81 MG tablet Take 81 mg by mouth daily.   atorvastatin  80 MG tablet Commonly known as: LIPITOR TAKE (1) TABLET BY MOUTH ONCE DAILY.   ezetimibe 10 MG tablet Commonly known as: ZETIA TAKE ONE TABLET BY MOUTH ONCE DAILY.   HYDROcodone-acetaminophen 5-325 MG tablet Commonly known as: NORCO/VICODIN TAKE 1 TABLET BY MOUTH (3) TIMES DAILY AS NEEDED FOR MODERATE PAIN.   lisinopril 20 MG tablet Commonly known as: ZESTRIL Take 20 mg by mouth daily.   metoprolol tartrate 50 MG tablet Commonly known as: LOPRESSOR Take 1 tablet (50 mg total) by mouth 2 (two) times daily.   Nicotrol 10 MG inhaler Generic drug: nicotine INHALE 1 CARTRIDGE INTO THE LUNGS AS NEEDED FOR SMOKING CESSATION.   nitroGLYCERIN 0.4 MG SL tablet Commonly known as: NITROSTAT Place 1 tablet (0.4 mg total) under the tongue every 5 (five) minutes as needed. Do not take within 2 days of viagra. What changed: additional instructions   pantoprazole 40 MG tablet Commonly known as: PROTONIX Take 1 tablet (40 mg total) by mouth 2 (two) times daily.   predniSONE 10 MG tablet Commonly known as: DELTASONE Take 2 tablets (20 mg total) by mouth 2 (two) times daily.   sildenafil 100 MG tablet Commonly known as: VIAGRA Take 1 tablet (100 mg total) by mouth as needed for erectile dysfunction.   tamsulosin 0.4 MG Caps capsule Commonly known as: FLOMAX Take 1 capsule (0.4 mg total) by mouth daily after supper.   tiZANidine 4 MG tablet Commonly known as: Zanaflex Take 1 tablet (4 mg total) by mouth at bedtime as needed for muscle spasms. Do not take with alcohol or while driving or operating heavy machinery           Outstanding Labs/Studies   Follow BP  BMP on Monday  Duration of Discharge Encounter   Greater than 30 minutes including physician time.  Signed, Tami Lin Lashika Erker, PA 09/30/2022, 11:29 AM

## 2022-09-30 NOTE — Progress Notes (Incomplete)
Echocardiogram 2D Echocardiogram has been performed.  Ronny Flurry 09/30/2022, 10:27 AM

## 2022-09-30 NOTE — Progress Notes (Signed)
BMP

## 2022-10-01 ENCOUNTER — Telehealth: Payer: Self-pay | Admitting: *Deleted

## 2022-10-01 ENCOUNTER — Other Ambulatory Visit: Payer: Self-pay | Admitting: Family Medicine

## 2022-10-01 NOTE — Patient Outreach (Signed)
  Care Coordination [facility] Note Transition Care Management Unsuccessful Follow-up Telephone Call  Date of discharge and from where:  [facility] on 09/30/22  Attempts:  1st Attempt  Reason for unsuccessful TCM follow-up call:  Left voice message  Chong Sicilian, BSN, RN-BC RN Care Coordinator [place]: [phone] Main #: [phone]

## 2022-10-02 LAB — LIPOPROTEIN A (LPA): Lipoprotein (a): 127.1 nmol/L — ABNORMAL HIGH (ref <75.0)

## 2022-10-04 ENCOUNTER — Telehealth: Payer: Self-pay | Admitting: *Deleted

## 2022-10-04 NOTE — Patient Outreach (Signed)
  Care Coordination TOC Note Transition Care Management Follow-up Telephone Call Date of discharge and from where: [facility] on 09/30/22 How have you been since you were released from the hospital? "I'm feeling much better than I did before I went to the Hospital. My blood pressure is a lot better. I think that was most of my problem." Any questions or concerns? No  Items Reviewed: Did the pt receive and understand the discharge instructions provided? Yes  Medications obtained and verified? Yes  Other? Yes reminded to check and record blood pressure, time, and symptoms at least once a day and to vary the time of day. Take that to PCP and cardio appt for review.  Any new allergies since your discharge? No  Dietary orders reviewed? Yes Do you have support at home? Yes   Home Care and Equipment/Supplies: Were home health services ordered? no If so, what is the name of the agency?   Has the agency set up a time to come to the patient's home? not applicable Were any new equipment or medical supplies ordered?  No What is the name of the medical supply agency?  Were you able to get the supplies/equipment? not applicable Do you have any questions related to the use of the equipment or supplies? No  Functional Questionnaire: (I = Independent and D = Dependent) ADLs: I  Bathing/Dressing- I  Meal Prep- I  Eating- I  Maintaining continence- I  Transferring/Ambulation- I  Managing Meds- I  Follow up appointments reviewed:  PCP Hospital f/u appt confirmed?  Not indicated . Reviewed upcoming telephone appt with [facility] PharmD for 10/07/22 at [facility] f/u appt confirmed? Yes  Scheduled to see Claudina Lick, MD (cardio) on 10/15/22 at 8:20 Are transportation arrangements needed? No  If their condition worsens, is the pt aware to call PCP or go to the Emergency Dept.? Yes Was the patient provided with contact information for the PCP's office or ED? Yes Was to pt encouraged  to call back with questions or concerns? Yes  SDOH assessments and interventions completed:   Yes  SDOH Interventions Today    Flowsheet Row Most Recent Value  SDOH Interventions   Housing Interventions Intervention Not Indicated  Transportation Interventions Intervention Not Indicated  Financial Strain Interventions Intervention Not Indicated        Care Coordination Interventions:  No Care Coordination interventions needed at this time.   Encounter Outcome:  Pt. Visit Completed    Chong Sicilian, BSN, RN-BC RN Care Coordinator [place] Direct Dial: [phone] Main #: [phone]

## 2022-10-04 NOTE — Patient Outreach (Signed)
  Care Coordination [facility] Note Transition Care Management Unsuccessful Follow-up Telephone Call  Date of discharge and from where:  [facility] on 09/30/22  Attempts:  2nd Attempt  Reason for unsuccessful TCM follow-up call:  Left voice message  Chong Sicilian, BSN, RN-BC RN Care Coordinator [place]: [phone] Main #: [phone]

## 2022-10-07 ENCOUNTER — Telehealth: Payer: PPO

## 2022-10-08 ENCOUNTER — Ambulatory Visit: Payer: PPO | Admitting: Pharmacist

## 2022-10-08 DIAGNOSIS — I10 Essential (primary) hypertension: Secondary | ICD-10-CM

## 2022-10-08 DIAGNOSIS — E7800 Pure hypercholesterolemia, unspecified: Secondary | ICD-10-CM

## 2022-10-08 NOTE — Progress Notes (Signed)
Chronic Care Management Pharmacy Note  10/08/2022 Name:  Brandon Buck MRN:  [id number] DOB:  04-20-1961  Summary: PharmD FU visit.  Recent ED visit and stent placement, BP has normalized after this.  LDL still elevated above goal.  Reports adherence with statin and zetia.  Recommend dietary changes and strict adherence.  If still elevated next check consider other options.  Recommendations/Changes made from today's visit: Consider PCSK-9 inhibitor with elevated LDL  Plan: Fu 6 months   Subjective: Brandon Buck is an 61 y.o. year old male who is a primary patient of Pickard, [name], MD.  The CCM team was consulted for assistance with disease management and care coordination needs.    Engaged with patient by telephone for follow up visit in response to provider referral for pharmacy case management and/or care coordination services.   Consent to Services:  The patient was given the following information about Chronic Care Management services today, agreed to services, and gave verbal consent: 1. CCM service includes personalized support from designated clinical staff supervised by the primary care provider, including individualized plan of care and coordination with other care providers 2. 24/7 contact phone numbers for assistance for urgent and routine care needs. 3. Service will only be billed when office clinical staff spend 20 minutes or more in a month to coordinate care. 4. Only one practitioner may furnish and bill the service in a calendar month. 5.The patient may stop CCM services at any time (effective at the end of the month) by phone call to the office staff. 6. The patient will be responsible for cost sharing (co-pay) of up to 20% of the service fee (after annual deductible is met). Patient agreed to services and consent obtained.  Patient Care Team: [name], MD as PCP - General (Family Medicine) Chalmers Guest, MD as PCP - Cardiology (Cardiology) Edythe Clarity, [facility] as Pharmacist (Pharmacist)  Recent office visits:  04/28/2022 Urgent Care for Neck Pain  -Start extra strength Tylenol 500 to 650 mg every 6 hours as needed for the pain in the back of your head/upper neck  -At nighttime, you can use tizanidine 4 mg to help relax the muscles    Recent consult visits:  07/20/2022 OV (Urology) Franchot Gallo, MD; no medication changes.   Hospital visits:  04/30/2022 ED visit for Neck pain - Patient will be treated with anti-inflammatory prednisone and pain medicine    Objective:  Lab Results  Component Value Date   CREATININE 1.67 (H) 09/30/2022   BUN 13 09/30/2022   GFRNONAA 46 (L) 09/30/2022   GFRAA 59 (L) 03/27/2021   NA 137 09/30/2022   K 4.1 09/30/2022   CALCIUM 8.9 09/30/2022   CO2 25 09/30/2022   GLUCOSE 87 09/30/2022    No results found for: "HGBA1C", "FRUCTOSAMINE", "GFR", "MICROALBUR"  Last diabetic Eye exam: No results found for: "HMDIABEYEEXA"  Last diabetic Foot exam: No results found for: "HMDIABFOOTEX"   Lab Results  Component Value Date   CHOL 182 09/30/2022   HDL 40 (L) 09/30/2022   LDLCALC 117 (H) 09/30/2022   TRIG 123 09/30/2022   CHOLHDL 4.6 09/30/2022       Latest Ref Rng & Units 02/19/2022    3:26 PM 03/27/2021   12:37 PM 10/29/2020   10:21 AM  Hepatic Function  Total Protein 6.5 - 8.1 g/dL 6.8  7.0  7.7   Albumin 3.5 - 5.0 g/dL 3.7   4.6   AST 15 -  41 U/L _0 ALT 0 - 44 U/L 22  25  39   Alk Phosphatase 38 - 126 U/L 67   72   Total Bilirubin 0.3 - 1.2 mg/dL 1.3  0.8  0.8     Lab Results  Component  Date/Time   TSH  07/01/2009 01:15 AM       Latest Ref Rng & Units 09/30/2022    2:56 AM 09/29/2022    5:29 AM 09/28/2022    8:50 PM  CBC  WBC 4.0 - 10.5 K/uL 5.2  4.9  5.8   Hemoglobin 13.0 - 17.0 g/dL 14.7  15.0  15.6   Hematocrit 39.0 - 52.0 % 43.4  44.2  45.4   Platelets 150 - 400 K/uL 150  143  155     No results found for: "VD25OH", "VITAMINB12"  Clinical ASCVD: Yes   The 10-year ASCVD risk score (Arnett DK, et al., 2019) is: 13.8%   Values used to calculate the score:     Age: 89 years     Sex: Male     Is Non-Hispanic African American: No     Diabetic: No     Tobacco smoker: No     Systolic Blood Pressure: 161 mmHg     Is BP treated: Yes     HDL Cholesterol: 40 mg/dL     Total Cholesterol: 182 mg/dL       07/24/2021   11:22 AM 03/27/2021   12:15 PM 01/31/2020    8:09 AM  Depression screen PHQ 2/9  Decreased Interest 0 0 0  Down, Depressed, Hopeless 0 0 0  PHQ - 2 Score 0 0 0     Social History   Tobacco Use  Smoking Status Former   Packs/day: 1.50   Years: 32.00   Total pack years: 48.00   Types: Cigarettes   Start date: 01/26/1971   Quit date: 01/26/2003   Years since quitting: 19.7  Smokeless Tobacco Never   BP Readings from Last 3 Encounters:  09/30/22 135/65  07/20/22 (!) 177/88  04/30/22 (!) 177/90   Pulse Readings from Last 3 Encounters:  09/30/22 (!) 54  07/20/22 76  04/30/22 62   Wt Readings from Last 3 Encounters:  09/28/22 220 lb (99.8 kg)  07/28/22 213 lb (96.6 kg)  07/20/22 213 lb (96.6 kg)   BMI Readings from Last 3 Encounters:  09/28/22 29.84 kg/m  07/28/22 28.89 kg/m  07/20/22 28.89 kg/m    Assessment/Interventions: Review of patient past medical history, allergies, medications, health status, including review of consultants reports, laboratory and other test data, was performed as part of comprehensive evaluation and provision of chronic care management services.   SDOH:  (Social Determinants of Health) assessments and interventions performed: No Financial Resource Strain: Low Risk  (10/04/2022)   Overall Financial Resource Strain (CARDIA)    Difficulty of Paying Living Expenses: Not hard at all   Food Insecurity: No Food Insecurity (09/29/2022)   Hunger Vital Sign    Worried About Running Out of Food in the Last Year: Never true    Ran Out of Food in the Last Year: Never true    SDOH Interventions     Flowsheet Row Telephone from 10/04/2022 in [place] from 07/24/2021 in [place] Interventions    Food Insecurity Interventions -- Intervention Not Indicated  Housing Interventions Intervention Not Indicated Intervention Not Indicated  Transportation Interventions Intervention  Not Indicated Intervention Not Indicated  Financial Strain Interventions Intervention Not Indicated Intervention Not Indicated  Physical Activity Interventions -- Intervention Not Indicated  Stress Interventions -- Intervention Not Indicated  Social Connections Interventions -- Intervention Not Indicated      SDOH Screenings   Food Insecurity: No Food Insecurity (09/29/2022)  Housing: Low Risk  (10/04/2022)  Transportation Needs: No Transportation Needs (10/04/2022)  Utilities: Not At Risk (09/29/2022)  Alcohol Screen: Low Risk  (07/24/2021)  Depression (PHQ2-9): Low Risk  (07/24/2021)  Financial Resource Strain: Low Risk  (10/04/2022)  Physical Activity: Sufficiently Active (07/24/2021)  Social Connections: Socially Integrated (07/24/2021)  Stress: No Stress Concern Present (07/24/2021)  Tobacco Use: Medium Risk (09/30/2022)    CCM Care Plan  Allergies  Allergen Reactions   Other     Pt. Reports having seizures after anesthesia once 1999 or 2000 none since   Keflex [Cephalexin] Rash    Medications Reviewed Today     Reviewed by Edythe Clarity, [facility] (Pharmacist) on 10/08/22 at [place] List Status: <None>   Medication Order Taking? Sig Documenting Provider Last Dose Status Informant  allopurinol (ZYLOPRIM) 100 MG tablet [id number] Yes TAKE 2 TABLETS BY MOUTH DAILY. [name], MD Taking Active Self  ALPRAZolam Duanne Moron) 0.5 MG tablet [id number] Yes TAKE (1) TABLET BY MOUTH TWICE DAILY AS NEEDED FOR ANXIETY [name] Maid, FNP Taking Active Self  aspirin 81 MG tablet [id number] Yes Take 81 mg by mouth daily.  [provider] Taking Active Self  atorvastatin (LIPITOR) 80 MG tablet [id number] Yes TAKE (1) TABLET BY MOUTH ONCE DAILY. [name], MD Taking Active Self  ezetimibe (ZETIA) 10 MG tablet [id number] Yes TAKE ONE TABLET BY MOUTH ONCE DAILY. [name], MD Taking Active Self  HYDROcodone-acetaminophen (NORCO/VICODIN) 5-325 MG tablet [id number] Yes TAKE 1 TABLET BY MOUTH (3) TIMES DAILY AS NEEDED FOR MODERATE PAIN. [name] Maid, FNP Taking Active Self  lisinopril (ZESTRIL) 20 MG tablet [id number] Yes Take 20 mg by mouth daily. [provider] Taking Active Self  metoprolol tartrate (LOPRESSOR) 50 MG tablet [id number]  Take 1 tablet (50 mg total) by mouth 2 (two) times daily. Isaiah Serge, NP  Expired 03/24/21 2359   NICOTROL 10 MG inhaler [id number] Yes INHALE 1 CARTRIDGE INTO THE LUNGS AS NEEDED FOR SMOKING CESSATION. [name], MD Taking Active Self  nitroGLYCERIN (NITROSTAT) 0.4 MG SL tablet [id number] Yes Place 1 tablet (0.4 mg total) under the tongue every 5 (five) minutes as needed. Do not take within 2 days of viagra. Ledora Bottcher, PA Taking Active   pantoprazole (PROTONIX) 40 MG tablet [id number] Yes Take 1 tablet (40 mg total) by mouth 2 (two) times daily. [name], MD Taking Active Self  predniSONE (DELTASONE) 10 MG tablet [id number] Yes Take 2 tablets (20 mg total) by mouth 2 (two) times daily. Veryl Speak, MD Taking Active Self  sildenafil (VIAGRA) 100 MG tablet [id number] Yes Take 1 tablet (100 mg total) by mouth as needed for erectile dysfunction. Franchot Gallo, MD Taking Active Self  tamsulosin [facility]) 0.4 MG CAPS capsule [id number] Yes Take 1 capsule (0.4 mg total) by mouth daily after supper. Franchot Gallo, MD Taking Active Self  tiZANidine (ZANAFLEX) 4 MG tablet [id number] Yes Take 1 tablet (4 mg total) by mouth at bedtime as needed for muscle spasms. Do not take with alcohol or while driving or operating heavy machinery  Eulogio Bear, NP Taking Active Self  Patient Active Problem List   Diagnosis Date Noted   Hypertensive urgency 09/29/2022   Other hyperlipidemia 09/29/2022   GERD (gastroesophageal reflux disease) 09/29/2022   CAD S/P percutaneous coronary angioplasty 09/29/2022   Hypertensive emergency without congestive heart failure 09/29/2022   Unstable angina ([place]) 09/28/2022   Hematospermia 11/10/2021   Microscopic hematuria 11/10/2021   Abnormal abdominal CT scan 10/02/2020   Malignant neoplasm of prostate ([place]) 09/02/2020   Essential hypertension 10/05/2019   Hyperkalemia 10/05/2019   CKD (chronic kidney disease) stage 3, GFR 30-59 ml/min (HCC)    Proteinuria    History of PTCA 2    Gout    Plantar fasciitis 03/01/2012   HIP PAIN 11/11/2008   ASEPTIC NECROSIS 11/11/2008    Immunization History  Administered Date(s) Administered   Influenza Inj Mdck Quad With Preservative 07/25/2018   Influenza,inj,Quad PF,6+ Mos 08/06/2014   Influenza,inj,quad, With Preservative 07/06/2019   Influenza-Unspecified 08/01/2015, 07/06/2019, 09/19/2020, 08/13/2021   Moderna Sars-Covid-2 Vaccination 01/05/2020, 02/05/2020, 08/22/2020   Pneumococcal Polysaccharide-23 06/25/2009, 01/23/2018    Conditions to be addressed/monitored:  CAD, Angina, HTN, HLD  Care Plan : General Pharmacy (Adult)  Updates made by Edythe Clarity, RPH since 10/08/2022 12:00 AM     Problem: CHL AMB "PATIENT-SPECIFIC PROBLEM"      Long-Range Goal: Patient-Specific Goal   Note:   Current Barriers:  Unable to achieve control of LDL   Pharmacist Clinical Goal(s):  Patient will verbalize ability to afford treatment regimen maintain control of BP as evidenced by monitoring  through collaboration with PharmD and provider.   Interventions: 1:1 collaboration with [name], MD regarding development and update of comprehensive plan of care as evidenced by provider attestation and  co-signature Inter-disciplinary care team collaboration (see longitudinal plan of care) Comprehensive medication review performed; medication list updated in electronic medical record  Hypertension (BP goal <130/80) 10/08/22 -Controlled -Current treatment: Lisinopril 34m Appropriate, Effective, Safe, Accessible Metoprolol 549mtwice daily Appropriate, Effective, Safe, Accessible -Medications previously tried: none noted  -Current home readings: 130s/80s normally, occasionally elevated but controlled for the most part -Current dietary habits: see HLD -Denies hypotensive/hypertensive symptoms -Educated on BP goals and benefits of medications for prevention of heart attack, stroke and kidney damage; Daily salt intake goal < 2300 mg; Importance of home blood pressure monitoring; -Counseled to monitor BP at home daily, document, and provide log at future appointments -Recommended to continue current medication Recent stent and elevated BP systolic 20277O has normalized after ED and stent placement. Continue to address risk factors. No changes at this time.  Hyperlipidemia/CAD: (LDL goal < 70) -Uncontrolled, last LDL in hospital was 117 -Current treatment: Atorvastatin 8040mppropriate, Query effective, ,  Ezetimibe 68m15mpropriate, Query effective, ,  -Medications previously tried: none noted  -Current dietary patterns: reports he has not been eating as well as he should eating more junk foods - plans to cut back on this -Current exercise habits: same see previous -Educated on Cholesterol goals;  Benefits of statin for ASCVD risk reduction; Importance of limiting foods high in cholesterol; -Recommended to continue current medication Current ASCVD ~ 13% - moderate risk.  Needs to continue adherence with statin.  If LDL not at goal next CPE, would recommend PCSK-9 if patient is willing and can afford.  Continue taking current meds until next recheck.  Work on diet!  Patient  Goals/Self-Care Activities Patient will:  - take medications as prescribed as evidenced by patient report and record review check blood pressure daily, document, and  provide at future appointments  Follow Up Plan: The care management team will reach out to the patient again over the next 180 days.       Medication Assistance: None required.  Patient affirms current coverage meets needs.  Compliance/Adherence/Medication fill history: Care Gaps: Due for AWV  Star-Rating Drugs: Atorvastatin 8m 11/18/21 - 90ds reports has some at home Lisinopril 252m12/08/23 90ds  [facility]  [facility]: [phone]: [phone]: [phone]: [phone] pill box? Yes Pt endorses 100% compliance  We discussed: Benefits of medication synchronization, packaging and delivery as well as enhanced pharmacist oversight with [employer]. Patient decided to: Continue current medication management strategy  Care Plan and Follow Up Patient Decision:  Patient agrees to Care Plan and Follow-up.  Plan: The care management team will reach out to the patient again over the next 180 days.  ChBeverly MilchPharmD, CPP Clinical Pharmacist Practitioner [facility] [phone]

## 2022-10-08 NOTE — Patient Instructions (Addendum)
Visit Information   Goals Addressed             This Visit's Progress    Track and Manage My Blood Pressure-Hypertension       Timeframe:  Long-Range Goal Priority:  High Start Date:                             Expected End Date:                       Follow Up Date 04/09/23    - check blood pressure daily    Why is this important?   You won't feel high blood pressure, but it can still hurt your blood vessels.  High blood pressure can cause heart or kidney problems. It can also cause a stroke.  Making lifestyle changes like losing a little weight or eating less salt will help.  Checking your blood pressure at home and at different times of the day can help to control blood pressure.  If the doctor prescribes medicine remember to take it the way the doctor ordered.  Call the office if you cannot afford the medicine or if there are questions about it.     Notes:        Patient Care Plan: General Pharmacy (Adult)     Problem Identified: CHL AMB "PATIENT-SPECIFIC PROBLEM"      Long-Range Goal: Patient-Specific Goal   Note:   Current Barriers:  Unable to achieve control of LDL   Pharmacist Clinical Goal(s):  Patient will verbalize ability to afford treatment regimen maintain control of BP as evidenced by monitoring  through collaboration with PharmD and provider.   Interventions: 1:1 collaboration with [name], MD regarding development and update of comprehensive plan of care as evidenced by provider attestation and co-signature Inter-disciplinary care team collaboration (see longitudinal plan of care) Comprehensive medication review performed; medication list updated in electronic medical record  Hypertension (BP goal <130/80) 10/08/22 -Controlled -Current treatment: Lisinopril '20mg'$  Appropriate, Effective, Safe, Accessible Metoprolol '50mg'$  twice daily Appropriate, Effective, Safe, Accessible -Medications previously tried: none noted  -Current home  readings: 130s/80s normally, occasionally elevated but controlled for the most part -Current dietary habits: see HLD -Denies hypotensive/hypertensive symptoms -Educated on BP goals and benefits of medications for prevention of heart attack, stroke and kidney damage; Daily salt intake goal < 2300 mg; Importance of home blood pressure monitoring; -Counseled to monitor BP at home daily, document, and provide log at future appointments -Recommended to continue current medication Recent stent and elevated BP systolic 381W - has normalized after ED and stent placement. Continue to address risk factors. No changes at this time.  Hyperlipidemia/CAD: (LDL goal < 70) -Uncontrolled, last LDL in hospital was 117 -Current treatment: Atorvastatin '80mg'$  Appropriate, Query effective, ,  Ezetimibe '10mg'$  Appropriate, Query effective, ,  -Medications previously tried: none noted  -Current dietary patterns: reports he has not been eating as well as he should eating more junk foods - plans to cut back on this -Current exercise habits: same see previous -Educated on Cholesterol goals;  Benefits of statin for ASCVD risk reduction; Importance of limiting foods high in cholesterol; -Recommended to continue current medication Current ASCVD ~ 13% - moderate risk.  Needs to continue adherence with statin.  If LDL not at goal next CPE, would recommend PCSK-9 if patient is willing and can afford.  Continue taking current meds until next recheck.  Work on  diet!  Patient Goals/Self-Care Activities Patient will:  - take medications as prescribed as evidenced by patient report and record review check blood pressure daily, document, and provide at future appointments  Follow Up Plan: The care management team will reach out to the patient again over the next 180 days.       The patient verbalized understanding of instructions, educational materials, and care plan provided today and DECLINED offer to receive copy of  patient instructions, educational materials, and care plan.  Telephone follow up appointment with pharmacy team member scheduled for: 6 months  Edythe Clarity, [place], PharmD, [place] Clinical Pharmacist Practitioner [place] [phone]

## 2022-10-11 ENCOUNTER — Other Ambulatory Visit: Payer: Self-pay | Admitting: Family Medicine

## 2022-10-11 ENCOUNTER — Telehealth: Payer: Self-pay | Admitting: Family Medicine

## 2022-10-11 DIAGNOSIS — F411 Generalized anxiety disorder: Secondary | ICD-10-CM

## 2022-10-11 NOTE — Telephone Encounter (Signed)
Left message - AWV scheduled 10/12/22 appt time changed to 10:30 instead of 11:15

## 2022-10-12 ENCOUNTER — Ambulatory Visit (INDEPENDENT_AMBULATORY_CARE_PROVIDER_SITE_OTHER): Payer: PPO

## 2022-10-12 VITALS — Ht 72.0 in | Wt 220.0 lb

## 2022-10-12 DIAGNOSIS — Z0000 Encounter for general adult medical examination without abnormal findings: Secondary | ICD-10-CM

## 2022-10-12 NOTE — Progress Notes (Signed)
Subjective:   Brandon Buck is a 61 y.o. male who presents for Medicare Annual/Subsequent preventive examination.  I connected with  Despina Arias on 10/12/22 by a audio enabled telemedicine application and verified that I am speaking with the correct person using two identifiers.  Patient Location: Home  Provider Location: Office/Clinic  I discussed the limitations of evaluation and management by telemedicine. The patient expressed understanding and agreed to proceed.   Review of Systems     Cardiac Risk Factors include: advanced age (>63mn, >>16women);male gender;hypertension;dyslipidemia;sedentary lifestyle     Objective:    Today's Vitals   10/12/22 1359  Weight: 220 lb (99.8 kg)  Height: 6' (1.829 m)   Body mass index is 29.84 kg/m.     10/12/2022    2:00 PM 09/28/2022    8:44 PM 04/30/2022    2:09 AM 02/19/2022    2:27 PM 07/24/2021   11:28 AM 11/20/2020    1:38 PM 11/03/2020    8:55 AM  Advanced Directives  Does Patient Have a Medical Advance Directive? No No No No No No No  Would patient like information on creating a medical advance directive? Yes (MAU/Ambulatory/Procedural Areas - Information given) No - Patient declined No - Patient declined  No - Patient declined No - Patient declined No - Patient declined    Current Medications (verified) Outpatient Encounter Medications as of 10/12/2022  Medication Sig   allopurinol (ZYLOPRIM) 100 MG tablet TAKE 2 TABLETS BY MOUTH DAILY.   ALPRAZolam (XANAX) 0.5 MG tablet TAKE (1) TABLET BY MOUTH TWICE DAILY AS NEEDED FOR ANXIETY   aspirin 81 MG tablet Take 81 mg by mouth daily.   atorvastatin (LIPITOR) 80 MG tablet TAKE (1) TABLET BY MOUTH ONCE DAILY.   ezetimibe (ZETIA) 10 MG tablet TAKE ONE TABLET BY MOUTH ONCE DAILY.   HYDROcodone-acetaminophen (NORCO/VICODIN) 5-325 MG tablet TAKE 1 TABLET BY MOUTH (3) TIMES DAILY AS NEEDED FOR MODERATE PAIN.   lisinopril (ZESTRIL) 20 MG tablet Take 20 mg by mouth daily.    NICOTROL 10 MG inhaler INHALE 1 CARTRIDGE INTO THE LUNGS AS NEEDED FOR SMOKING CESSATION.   nitroGLYCERIN (NITROSTAT) 0.4 MG SL tablet Place 1 tablet (0.4 mg total) under the tongue every 5 (five) minutes as needed. Do not take within 2 days of viagra.   pantoprazole (PROTONIX) 40 MG tablet Take 1 tablet (40 mg total) by mouth 2 (two) times daily.   sildenafil (VIAGRA) 100 MG tablet Take 1 tablet (100 mg total) by mouth as needed for erectile dysfunction.   tamsulosin (FLOMAX) 0.4 MG CAPS capsule Take 1 capsule (0.4 mg total) by mouth daily after supper.   tiZANidine (ZANAFLEX) 4 MG tablet Take 1 tablet (4 mg total) by mouth at bedtime as needed for muscle spasms. Do not take with alcohol or while driving or operating heavy machinery   metoprolol tartrate (LOPRESSOR) 50 MG tablet Take 1 tablet (50 mg total) by mouth 2 (two) times daily.   [DISCONTINUED] predniSONE (DELTASONE) 10 MG tablet Take 2 tablets (20 mg total) by mouth 2 (two) times daily.   No facility-administered encounter medications on file as of 10/12/2022.    Allergies (verified) Other and Keflex [cephalexin]   History: Past Medical History:  Diagnosis Date   Anxiety    Arthritis    Asthma    [place]    right renal cancer, right radical nephrectomy 2004   Chronic back pain    Chronic hip pain, left    CKD (  chronic kidney disease) stage 3, GFR 30-59 ml/min (HCC)    ckd stage 3 b    Complication of anesthesia    1 seizure after anesthesia 1999 or 2000 none since , recent surgeries no seizures   Coronary artery disease    a. s/p prior PCI to RCA and LCx in 2009   Depression    GERD (gastroesophageal reflux disease)    Gout    no recent flares   Heart disease    High blood pressure    History of PTCA 2 2004   2 stents   History of stomach ulcers    Hypercholesteremia    Hypertension    Kidney disease    Myocardial infarction [facility]) 2004   Prostate cancer ([place])    3/12 cores upt to 40% grade 2 cancer.  Planning brachytherapy (2021)   Proteinuria    Renal insufficiency    Tennis elbow    Past Surgical History:  Procedure Laterality Date   COLONOSCOPY WITH PROPOFOL N/A 10/28/2020   Procedure: COLONOSCOPY WITH PROPOFOL;  Surgeon: Harvel Quale, MD;  [facility];  Service: Gastroenterology;  Laterality: N/A;  2:30   coloscopy  10/28/2020   removed 7 polyps done at [place] N/A 11/03/2020   Procedure: CYSTOSCOPY;  Surgeon: Franchot Gallo, MD;  Location: [facility];  Service: Urology;  Laterality: N/A;   Heart catherization  2004   2 stents to heart cypher left circulflex   HERNIA REPAIR  1999   left groin bilateral   HIP SURGERY Left 1999 or 2000   bond graft   Kidney removed Right 2003 or 2004   for cancer   LEFT HEART CATH AND CORONARY ANGIOGRAPHY N/A 09/29/2022   Procedure: LEFT HEART CATH AND CORONARY ANGIOGRAPHY;  Surgeon: Jettie Booze, MD;  Location: [place] [facility];  Service: Cardiovascular;  Laterality: N/A;   POLYPECTOMY  10/28/2020   Procedure: POLYPECTOMY INTESTINAL;  Surgeon: Harvel Quale, MD;  [facility];  Service: Gastroenterology;;   PROSTATE BIOPSY  2021   RADIOACTIVE SEED IMPLANT N/A 11/03/2020   Procedure: RADIOACTIVE SEED IMPLANT/BRACHYTHERAPY IMPLANT;  Surgeon: Franchot Gallo, MD;  Location: [facility]  Service: Urology;  Laterality: N/A;  [place] N/A 11/03/2020   Procedure: SPACE OAR INSTILLATION;  Surgeon: Franchot Gallo, MD;  Location: [facility];  Service: Urology;  Laterality: N/A;   Family History  Problem Relation Age of Onset   Heart disease Other    Cancer Other    Kidney disease Other    Heart disease Father    Prostate cancer Father    Heart disease Mother    Lung cancer Mother    Kidney cancer Mother    Prostate cancer Maternal Uncle    Bladder Cancer Maternal Grandfather    Breast cancer Neg  Hx    Colon cancer Neg Hx    Pancreatic cancer Neg Hx    Social History   Socioeconomic History   Marital status: Married    Spouse name: Sheree   Number of children: 0   Years of education: Not on file   Highest education level: Not on file  Occupational History   Occupation: retired  Tobacco Use   Smoking status: Former    Packs/day: 1.50    Years: 32.00    Total pack years: 48.00    Types: Cigarettes    Start date: 01/26/1971  Quit date: 01/26/2003    Years since quitting: 19.7   Smokeless tobacco: Never  Vaping Use   Vaping Use: Never used  Substance and Sexual Activity   Alcohol use: Yes    Alcohol/week: 0.0 standard drinks of alcohol    Comment: occ 2 week   Drug use: No   Sexual activity: Yes  Other Topics Concern   Not on file  Social History Narrative   Worked as an Clinical biochemist.    Married x 45 years in 2022.   Social Determinants of Health   Financial Resource Strain: Low Risk  (10/04/2022)   Overall Financial Resource Strain (CARDIA)    Difficulty of Paying Living Expenses: Not hard at all  Food Insecurity: No Food Insecurity (09/29/2022)   Hunger Vital Sign    Worried About Running Out of Food in the Last Year: Never true    Ran Out of Food in the Last Year: Never true  Transportation Needs: No Transportation Needs (10/04/2022)   PRAPARE - Hydrologist (Medical): No    Lack of Transportation (Non-Medical): No  Physical Activity: Sufficiently Active (07/24/2021)   Exercise Vital Sign    Days of Exercise per Week: 5 days    Minutes of Exercise per Session: 30 min  Stress: No Stress Concern Present (07/24/2021)   [place]    Feeling of Stress : Not at all  Social Connections: [place] (07/24/2021)   Social Connection and Isolation Panel [NHANES]    Frequency of Communication with Friends and Family: More than three times a week    Frequency of  Social Gatherings with Friends and Family: Three times a week    Attends Religious Services: 1 to 4 times per year    Active Member of Clubs or Organizations: No    Attends Music therapist: 1 to 4 times per year    Marital Status: Married    Tobacco Counseling Counseling given: Not Answered   Clinical Intake:  Pre-visit preparation completed: Yes  Pain : No/denies pain  Diabetes: No  How often do you need to have someone help you when you read instructions, pamphlets, or other written materials from your doctor or pharmacy?: 1 - Never  Diabetic?No   Interpreter Needed?: No  Information entered by :: Denman George LPN   Activities of Daily Living    10/12/2022    2:00 PM 09/29/2022    5:42 PM  In your present state of health, do you have any difficulty performing the following activities:  Hearing? 0 0  Vision? 1 1  Difficulty concentrating or making decisions? 0 0  Walking or climbing stairs? 0 1  Comment  can become SOB when climbing stairs  Dressing or bathing? 0 0  Doing errands, shopping? 0 0  Preparing Food and eating ? N   Using the Toilet? N   In the past six months, have you accidently leaked urine? N   Do you have problems with loss of bowel control? N   Managing your Medications? N   Managing your Finances? N   Housekeeping or managing your Housekeeping? N     Patient Care Team: [name], MD as PCP - General (Family Medicine) Mallipeddi, Quenten Raven, MD as PCP - Cardiology (Cardiology) Edythe Clarity, [facility] as Pharmacist (Pharmacist) Franchot Gallo, MD as Consulting Physician (Urology)  Indicate any recent Medical Services you may have received from other than [facility] providers  in the past year (date may be approximate).     Assessment:   This is a routine wellness examination for [facility].  Hearing/Vision screen Hearing Screening - Comments:: No concerns at this time   Vision Screening - Comments:: up to date with  routine eye exams with [facility]    Dietary issues and exercise activities discussed: Current Exercise Habits: The patient does not participate in regular exercise at present   Goals Addressed   None   Depression Screen    07/24/2021   11:22 AM 03/27/2021   12:15 PM 01/31/2020    8:09 AM 01/23/2018   10:42 AM  PHQ 2/9 Scores  PHQ - 2 Score 0 0 0 0    Fall Risk    10/12/2022    2:00 PM 07/24/2021   11:28 AM 03/27/2021   12:15 PM  Fall Risk   Falls in the past year? 0 0 0  Number falls in past yr: 0 0 0  Injury with Fall? 0 0 0  Risk for fall due to : No Fall Risks Impaired vision No Fall Risks  Follow up Falls evaluation completed;Education provided;Falls prevention discussed Falls prevention discussed Falls evaluation completed    FALL RISK PREVENTION PERTAINING TO THE HOME:  Any stairs in or around the home? Yes  If so, are there any without handrails? No  Home free of loose throw rugs in walkways, pet beds, electrical cords, etc? Yes  Adequate lighting in your home to reduce risk of falls? Yes   ASSISTIVE DEVICES UTILIZED TO PREVENT FALLS:  Life alert? No  Use of a cane, walker or w/c? No  Grab bars in the bathroom? No  Shower chair or bench in shower? No  Elevated toilet seat or a handicapped toilet? No   TIMED UP AND GO:  Was the test performed? No  telephonic visit   Cognitive Function:        10/12/2022    2:00 PM 07/24/2021   11:33 AM  6CIT Screen  What Year? 0 points 0 points  What month? 0 points 0 points  What time? 0 points 0 points  Count back from 20 0 points 0 points  Months in reverse 0 points 0 points  Repeat phrase 0 points 0 points  Total Score 0 points 0 points    Immunizations Immunization History  Administered Date(s) Administered   Influenza Inj Mdck Quad With Preservative 07/25/2018   Influenza,inj,Quad PF,6+ Mos 08/06/2014   Influenza,inj,quad, With Preservative 07/06/2019   Influenza-Unspecified 08/01/2015, 07/06/2019,  09/19/2020, 08/13/2021   Moderna Sars-Covid-2 Vaccination 01/05/2020, 02/05/2020, 08/22/2020   Pneumococcal Polysaccharide-23 06/25/2009, 01/23/2018    TDAP status: Due, Education has been provided regarding the importance of this vaccine. Advised may receive this vaccine at local pharmacy or Health Dept. Aware to provide a copy of the vaccination record if obtained from local pharmacy or Health Dept. Verbalized acceptance and understanding.  Flu Vaccine status: Due, Education has been provided regarding the importance of this vaccine. Advised may receive this vaccine at local pharmacy or Health Dept. Aware to provide a copy of the vaccination record if obtained from local pharmacy or Health Dept. Verbalized acceptance and understanding.  Pneumococcal vaccine status: Up to date  Covid-19 vaccine status: Information provided on how to obtain vaccines.   Qualifies for Shingles Vaccine? Yes   Zostavax completed No   Shingrix Completed?: No.    Education has been provided regarding the importance of this vaccine. Patient has been advised to call insurance  company to determine out of pocket expense if they have not yet received this vaccine. Advised may also receive vaccine at local pharmacy or Health Dept. Verbalized acceptance and understanding.  Screening Tests Health Maintenance  Topic Date Due   DTaP/Tdap/Td (1 - Tdap) Never done   Zoster Vaccines- Shingrix (1 of 2) Never done   INFLUENZA VACCINE  05/25/2022   COVID-19 Vaccine (4 - 2023-24 season) 06/25/2022   Fecal DNA (Cologuard)  02/11/2023   Medicare Annual Wellness (AWV)  10/13/2023   Hepatitis C Screening  Completed   HPV VACCINES  Aged Out   COLONOSCOPY (Pts 45-52yr Insurance coverage will need to be confirmed)  Discontinued   HIV Screening  Discontinued    Health Maintenance  Health Maintenance Due  Topic Date Due   DTaP/Tdap/Td (1 - Tdap) Never done   Zoster Vaccines- Shingrix (1 of 2) Never done   INFLUENZA VACCINE   05/25/2022   COVID-19 Vaccine (4 - 2023-24 season) 06/25/2022    Colorectal cancer screening: Type of screening: Cologuard. Completed 02/11/20. Repeat every 3 years  Lung Cancer Screening: (Low Dose CT Chest recommended if Age 62-80years, 30 pack-year currently smoking OR have quit w/in 15years.) does not qualify.   Lung Cancer Screening Referral: n/a  Additional Screening:  Hepatitis C Screening: does qualify; Completed 04/26/16  Vision Screening: Recommended annual ophthalmology exams for early detection of glaucoma and other disorders of the eye. Is the patient up to date with their annual eye exam?  Yes  Who is the provider or what is the name of the office in which the patient attends annual eye exams? [facility] Dr. RLinna Hoff If pt is not established with a provider, would they like to be referred to a provider to establish care? No .   Dental Screening: Recommended annual dental exams for proper oral hygiene  Community Resource Referral / Chronic Care Management: CRR required this visit?  No   CCM required this visit?  No      Plan:     I have personally reviewed and noted the following in the patient's chart:   Medical and social history Use of alcohol, tobacco or illicit drugs  Current medications and supplements including opioid prescriptions. Patient is currently taking opioid prescriptions. Information provided to patient regarding non-opioid alternatives. Patient advised to discuss non-opioid treatment plan with their provider. Functional ability and status Nutritional status Physical activity Advanced directives List of other physicians Hospitalizations, surgeries, and ER visits in previous 12 months Vitals Screenings to include cognitive, depression, and falls Referrals and appointments  In addition, I have reviewed and discussed with patient certain preventive protocols, quality metrics, and best practice recommendations. A written personalized care plan for  preventive services as well as general preventive health recommendations were provided to patient.     SVanetta Mulders LPN   [id number]  Due to this being a virtual visit, the after visit summary with patients personalized plan was offered to patient via mail or my-chart.  Per request, patient was mailed a copy of AVS.  Nurse Notes: Patient is requesting a refill on Pantoprazole; will send as a refill request note.

## 2022-10-12 NOTE — Patient Instructions (Signed)
Brandon Buck , Thank you for taking time to come for your Medicare Wellness Visit. I appreciate your ongoing commitment to your health goals. Please review the following plan we discussed and let me know if I can assist you in the future.   These are the goals we discussed:  Goals      Exercise 3x per week (30 min per time)     Pharmacy Care Plan:     CARE PLAN ENTRY (see longitudinal plan of care for additional care plan information)  Current Barriers:  Chronic Disease Management support, education, and care coordination needs related to Hypertension and Hyperlipidemia   Hypertension BP Readings from Last 3 Encounters:  06/19/20 (!) 164/78  05/20/20 (!) 167/84  04/22/20 (!) 146/80  Pharmacist Clinical Goal(s): Over the next 180 days, patient will work with PharmD and providers to achieve BP goal <140/90 Current regimen:  Diltiazem '240mg'$  daily Metoprolol tartrate '50mg'$   Lisinopril '20mg'$  Interventions: Counseled on blood pressure goals Discussed need for more regular monitoring Patient self care activities - Over the next 180 days, patient will: Check BP periodically, document, and provide at future appointments Ensure daily salt intake < 2300 mg/day  Hyperlipidemia Lab Results  Component Value Date/Time   LDLCALC 168 (H) 01/31/2020 08:19 AM  Pharmacist Clinical Goal(s): Over the next 180 days, patient will work with PharmD and providers to achieve LDL goal < 100 Current regimen:  Zetia '10mg'$  Atorvastatin '80mg'$  Interventions: Discussed importance of medication adherence Recommended follow up with PCP for updated lipid panel Patient self care activities - Over the next 180 days, patient will: Continue to focus on medication adherence by pill count Make appointment with PCP to get lab work updated   Initial goal documentation      Track and Manage My Blood Pressure-Hypertension     Timeframe:  Long-Range Goal Priority:  High Start Date:                              Expected End Date:                       Follow Up Date 04/09/23    - check blood pressure daily    Why is this important?   You won't feel high blood pressure, but it can still hurt your blood vessels.  High blood pressure can cause heart or kidney problems. It can also cause a stroke.  Making lifestyle changes like losing a little weight or eating less salt will help.  Checking your blood pressure at home and at different times of the day can help to control blood pressure.  If the doctor prescribes medicine remember to take it the way the doctor ordered.  Call the office if you cannot afford the medicine or if there are questions about it.     Notes:         This is a list of the screening recommended for you and due dates:  Health Maintenance  Topic Date Due   DTaP/Tdap/Td vaccine (1 - Tdap) Never done   Zoster (Shingles) Vaccine (1 of 2) Never done   Flu Shot  05/25/2022   COVID-19 Vaccine (4 - 2023-24 season) 06/25/2022   Cologuard (Stool DNA test)  02/11/2023   Medicare Annual Wellness Visit  10/13/2023   Hepatitis C Screening: USPSTF Recommendation to screen - Ages 18-79 yo.  Completed   HPV Vaccine  Aged Out  Colon Cancer Screening  Discontinued   HIV Screening  Discontinued    Advanced directives: Advance directive discussed with you today. I have provided a copy for you to complete at home and have notarized. Once this is complete please bring a copy in to our office so we can scan it into your chart.   Conditions/risks identified: Aim for 30 minutes of exercise or brisk walking, 6-8 glasses of water, and 5 servings of fruits and vegetables each day.   Next appointment: Follow up in one year for your annual wellness visit   Preventive Care 40-64 Years, Male Preventive care refers to lifestyle choices and visits with your health care provider that can promote health and wellness. What does preventive care include? A yearly physical exam. This is also called an  annual well check. Dental exams once or twice a year. Routine eye exams. Ask your health care provider how often you should have your eyes checked. Personal lifestyle choices, including: Daily care of your teeth and gums. Regular physical activity. Eating a healthy diet. Avoiding tobacco and drug use. Limiting alcohol use. Practicing safe sex. Taking low-dose aspirin every day starting at age 22. What happens during an annual well check? The services and screenings done by your health care provider during your annual well check will depend on your age, overall health, lifestyle risk factors, and family history of disease. Counseling  Your health care provider may ask you questions about your: Alcohol use. Tobacco use. Drug use. Emotional well-being. Home and relationship well-being. Sexual activity. Eating habits. Work and work Statistician. Screening  You may have the following tests or measurements: Height, weight, and BMI. Blood pressure. Lipid and cholesterol levels. These may be checked every 5 years, or more frequently if you are over 7 years old. Skin check. Lung cancer screening. You may have this screening every year starting at age 75 if you have a 30-pack-year history of smoking and currently smoke or have quit within the past 15 years. Fecal occult blood test (FOBT) of the stool. You may have this test every year starting at age 7. Flexible sigmoidoscopy or colonoscopy. You may have a sigmoidoscopy every 5 years or a colonoscopy every 10 years starting at age 41. Prostate cancer screening. Recommendations will vary depending on your family history and other risks. Hepatitis C blood test. Hepatitis B blood test. Sexually transmitted disease (STD) testing. Diabetes screening. This is done by checking your blood sugar (glucose) after you have not eaten for a while (fasting). You may have this done every 1-3 years. Discuss your test results, treatment options, and if  necessary, the need for more tests with your health care provider. Vaccines  Your health care provider may recommend certain vaccines, such as: Influenza vaccine. This is recommended every year. Tetanus, diphtheria, and acellular pertussis (Tdap, Td) vaccine. You may need a Td booster every 10 years. Zoster vaccine. You may need this after age 85. Pneumococcal 13-valent conjugate (PCV13) vaccine. You may need this if you have certain conditions and have not been vaccinated. Pneumococcal polysaccharide (PPSV23) vaccine. You may need one or two doses if you smoke cigarettes or if you have certain conditions. Talk to your health care provider about which screenings and vaccines you need and how often you need them. This information is not intended to replace advice given to you by your health care provider. Make sure you discuss any questions you have with your health care provider. Document Released: 11/07/2015 Document Revised: 06/30/2016 Document Reviewed: 08/12/2015  Chartered certified accountant Patient Education  2017 [place] Prevention in the [facility] can cause injuries. They can happen to people of all ages. There are many things you can do to make your home safe and to help prevent falls. What can I do on the outside of my home? Regularly fix the edges of walkways and driveways and fix any cracks. Remove anything that might make you trip as you walk through a door, such as a raised step or threshold. Trim any bushes or trees on the path to your home. Use bright outdoor lighting. Clear any walking paths of anything that might make someone trip, such as rocks or tools. Regularly check to see if handrails are loose or broken. Make sure that both sides of any steps have handrails. Any raised decks and porches should have guardrails on the edges. Have any leaves, snow, or ice cleared regularly. Use sand or salt on walking paths during winter. Clean up any spills in your garage right  away. This includes oil or grease spills. What can I do in the bathroom? Use night lights. Install grab bars by the toilet and in the tub and shower. Do not use towel bars as grab bars. Use non-skid mats or decals in the tub or shower. If you need to sit down in the shower, use a plastic, non-slip stool. Keep the floor dry. Clean up any water that spills on the floor as soon as it happens. Remove soap buildup in the tub or shower regularly. Attach bath mats securely with double-sided non-slip rug tape. Do not have throw rugs and other things on the floor that can make you trip. What can I do in the bedroom? Use night lights. Make sure that you have a light by your bed that is easy to reach. Do not use any sheets or blankets that are too big for your bed. They should not hang down onto the floor. Have a firm chair that has side arms. You can use this for support while you get dressed. Do not have throw rugs and other things on the floor that can make you trip. What can I do in the kitchen? Clean up any spills right away. Avoid walking on wet floors. Keep items that you use a lot in easy-to-reach places. If you need to reach something above you, use a strong step stool that has a grab bar. Keep electrical cords out of the way. Do not use floor polish or wax that makes floors slippery. If you must use wax, use non-skid floor wax. Do not have throw rugs and other things on the floor that can make you trip. What can I do with my stairs? Do not leave any items on the stairs. Make sure that there are handrails on both sides of the stairs and use them. Fix handrails that are broken or loose. Make sure that handrails are as long as the stairways. Check any carpeting to make sure that it is firmly attached to the stairs. Fix any carpet that is loose or worn. Avoid having throw rugs at the top or bottom of the stairs. If you do have throw rugs, attach them to the floor with carpet tape. Make sure  that you have a light switch at the top of the stairs and the bottom of the stairs. If you do not have them, ask someone to add them for you. What else can I do to help prevent falls? Wear shoes that: Do not have  high heels. Have rubber bottoms. Are comfortable and fit you well. Are closed at the toe. Do not wear sandals. If you use a stepladder: Make sure that it is fully opened. Do not climb a closed stepladder. Make sure that both sides of the stepladder are locked into place. Ask someone to hold it for you, if possible. Clearly mark and make sure that you can see: Any grab bars or handrails. First and last steps. Where the edge of each step is. Use tools that help you move around (mobility aids) if they are needed. These include: Canes. Walkers. Scooters. Crutches. Turn on the lights when you go into a dark area. Replace any light bulbs as soon as they burn out. Set up your furniture so you have a clear path. Avoid moving your furniture around. If any of your floors are uneven, fix them. If there are any pets around you, be aware of where they are. Review your medicines with your doctor. Some medicines can make you feel dizzy. This can increase your chance of falling. Ask your doctor what other things that you can do to help prevent falls. This information is not intended to replace advice given to you by your health care provider. Make sure you discuss any questions you have with your health care provider. Document Released: 08/07/2009 Document Revised: 03/18/2016 Document Reviewed: 11/15/2014 Elsevier Interactive Patient Education  2017 [employer].

## 2022-10-15 ENCOUNTER — Ambulatory Visit: Payer: PPO | Attending: Internal Medicine | Admitting: Internal Medicine

## 2022-10-15 ENCOUNTER — Encounter: Payer: Self-pay | Admitting: Internal Medicine

## 2022-10-15 VITALS — BP 160/80 | HR 57 | Ht 72.0 in | Wt 215.0 lb

## 2022-10-15 DIAGNOSIS — I2510 Atherosclerotic heart disease of native coronary artery without angina pectoris: Secondary | ICD-10-CM

## 2022-10-15 DIAGNOSIS — E7849 Other hyperlipidemia: Secondary | ICD-10-CM | POA: Diagnosis not present

## 2022-10-15 DIAGNOSIS — I160 Hypertensive urgency: Secondary | ICD-10-CM | POA: Diagnosis not present

## 2022-10-15 DIAGNOSIS — Z9861 Coronary angioplasty status: Secondary | ICD-10-CM | POA: Diagnosis not present

## 2022-10-15 MED ORDER — CARVEDILOL 6.25 MG PO TABS: 6.2500 mg | ORAL_TABLET | Freq: Two times a day (BID) | ORAL | 3 refills | Status: DC

## 2022-10-15 MED ORDER — AMLODIPINE BESYLATE 5 MG PO TABS: 5.0000 mg | ORAL_TABLET | Freq: Every day | ORAL | 3 refills | Status: DC

## 2022-10-15 MED ORDER — HYDRALAZINE HCL 25 MG PO TABS: 25.0000 mg | ORAL_TABLET | Freq: Three times a day (TID) | ORAL | 3 refills | Status: DC

## 2022-10-15 NOTE — Patient Instructions (Signed)
Medication Instructions:  Your physician has recommended you make the following change in your medication:  - Stop Metoprolol  - Start Amlodipine 5 mg tablets daily - Start Hydralazine 25 mg tablets three times daily - Start Coreg 6.225 mg tablets twice daily   Labwork: None  Testing/Procedures: None  Follow-Up: Follow up with Dr. Dellia Cloud in 1 month.   Any Other Special Instructions Will Be Listed Below (If Applicable).     If you need a refill on your cardiac medications before your next appointment, please call your pharmacy.

## 2022-10-18 NOTE — Progress Notes (Signed)
Cardiology Office Note  Date: 10/18/2022   ID: Brandon Buck, DOB [birth year], MRN [id number]  PCP:  [name], MD  Cardiologist:  Chalmers Guest, MD Electrophysiologist:  None   Reason for Office Visit: HTN management   History of Present Illness: Brandon Buck is a 61 y.o. male known to have CAD s/p RCA and LCX PCI in 2009 with normal LVEF, HTN, HLD, COPD, prostrate cancer s/p seed implantation and brachytherapy, Stage III CKD (prior right nephrectomy in 2003) presented to the cardiology clinic for follow-up.  Patient underwent LHC in 09/2022 for unstable angina which showed patent RCA and LCX stents and mild non-obstructive CAD diffusely. Patient continues to have chest pain but mild in intensity and not as severe as before LHC. His BP at home is usually around 170-180 mm Hg SBP and sometimes in 200s when he feels symptoms of occipital headache, blurry vision and neck pain. He does not want to be on medications that affects his kidney. Denied dizziness, syncope, LE swelling.  Past Medical History:  Diagnosis Date   Anxiety    Arthritis    Asthma    Cancer ([place])    right renal cancer, right radical nephrectomy 2004   Chronic back pain    Chronic hip pain, left    CKD (chronic kidney disease) stage 3, GFR 30-59 ml/min (HCC)    ckd stage 3 b    Complication of anesthesia    1 seizure after anesthesia 1999 or 2000 none since , recent surgeries no seizures   Coronary artery disease    a. s/p prior PCI to RCA and LCx in 2009   Depression    GERD (gastroesophageal reflux disease)    Gout    no recent flares   Heart disease    High blood pressure    History of PTCA 2 2004   2 stents   History of stomach ulcers    Hypercholesteremia    Hypertension    Kidney disease    Myocardial infarction ([place]) 2004   Prostate cancer ([place])    3/12 cores upt to 40% grade 2 cancer. Planning brachytherapy (2021)   Proteinuria    Renal insufficiency    Tennis elbow      Past Surgical History:  Procedure Laterality Date   COLONOSCOPY WITH PROPOFOL N/A 10/28/2020   Procedure: COLONOSCOPY WITH PROPOFOL;  Surgeon: Harvel Quale, MD;  [facility];  Service: Gastroenterology;  Laterality: N/A;  2:30   coloscopy  10/28/2020   removed 7 polyps done at [place] N/A 11/03/2020   Procedure: CYSTOSCOPY;  Surgeon: Franchot Gallo, MD;  Location[facility];  Service: Urology;  Laterality: N/A;   Heart catherization  2004   2 stents to heart cypher left circulflex   HERNIA REPAIR  1999   left groin bilateral   HIP SURGERY Left 1999 or 2000   bond graft   Kidney removed Right 2003 or 2004   for cancer   LEFT HEART CATH AND CORONARY ANGIOGRAPHY N/A 09/29/2022   Procedure: LEFT HEART CATH AND CORONARY ANGIOGRAPHY;  Surgeon: Jettie Booze, MD;  Location: [place] [facility];  Service: Cardiovascular;  Laterality: N/A;   POLYPECTOMY  10/28/2020   Procedure: POLYPECTOMY INTESTINAL;  Surgeon: Harvel Quale, MD;  Location: [facility];  Service: Gastroenterology;;   PROSTATE BIOPSY  2021   RADIOACTIVE SEED IMPLANT N/A 11/03/2020   Procedure: RADIOACTIVE SEED IMPLANT/BRACHYTHERAPY IMPLANT;  Surgeon:  Franchot Gallo, MD;  Location: [facility];  Service: Urology;  Laterality: N/A;  [place] N/A 11/03/2020   Procedure: SPACE OAR INSTILLATION;  Surgeon: Franchot Gallo, MD;  Location: [facility];  Service: Urology;  Laterality: N/A;    Current Outpatient Medications  Medication Sig Dispense Refill   allopurinol (ZYLOPRIM) 100 MG tablet TAKE 2 TABLETS BY MOUTH DAILY. 60 tablet 2   ALPRAZolam (XANAX) 0.5 MG tablet TAKE (1) TABLET BY MOUTH TWICE DAILY AS NEEDED FOR ANXIETY 30 tablet 0   amLODipine (NORVASC) 5 MG tablet Take 1 tablet (5 mg total) by mouth daily. 90 tablet 3   aspirin 81 MG tablet Take 81 mg by mouth daily.     atorvastatin  (LIPITOR) 80 MG tablet TAKE (1) TABLET BY MOUTH ONCE DAILY. 90 tablet 0   carvedilol (COREG) 6.25 MG tablet Take 1 tablet (6.25 mg total) by mouth 2 (two) times daily. 180 tablet 3   ezetimibe (ZETIA) 10 MG tablet TAKE ONE TABLET BY MOUTH ONCE DAILY. 30 tablet 0   hydrALAZINE (APRESOLINE) 25 MG tablet Take 1 tablet (25 mg total) by mouth 3 (three) times daily. 270 tablet 3   HYDROcodone-acetaminophen (NORCO/VICODIN) 5-325 MG tablet TAKE 1 TABLET BY MOUTH (3) TIMES DAILY AS NEEDED FOR MODERATE PAIN. 90 tablet 0   lisinopril (ZESTRIL) 20 MG tablet Take 20 mg by mouth daily.     NICOTROL 10 MG inhaler INHALE 1 CARTRIDGE INTO THE LUNGS AS NEEDED FOR SMOKING CESSATION. 168 each 0   nitroGLYCERIN (NITROSTAT) 0.4 MG SL tablet Place 1 tablet (0.4 mg total) under the tongue every 5 (five) minutes as needed. Do not take within 2 days of viagra. 25 tablet 3   pantoprazole (PROTONIX) 40 MG tablet Take 1 tablet (40 mg total) by mouth 2 (two) times daily. 180 tablet 3   sildenafil (VIAGRA) 100 MG tablet Take 1 tablet (100 mg total) by mouth as needed for erectile dysfunction. 15 tablet prn   tamsulosin (FLOMAX) 0.4 MG CAPS capsule Take 1 capsule (0.4 mg total) by mouth daily after supper. 90 capsule 11   tiZANidine (ZANAFLEX) 4 MG tablet Take 1 tablet (4 mg total) by mouth at bedtime as needed for muscle spasms. Do not take with alcohol or while driving or operating heavy machinery 30 tablet 0   No current facility-administered medications for this visit.   Allergies:  Other and Keflex [cephalexin]   Social History: The patient  reports that he quit smoking about 19 years ago. His smoking use included cigarettes. He started smoking about 51 years ago. He has a 48.00 pack-year smoking history. He has never used smokeless tobacco. He reports current alcohol use. He reports that he does not use drugs.   Family History: The patient's family history includes Bladder Cancer in his maternal grandfather; Cancer in an  other family member; Heart disease in his father, mother, and another family member; Kidney cancer in his mother; Kidney disease in an other family member; Lung cancer in his mother; Prostate cancer in his father and maternal uncle.   ROS:  Please see the history of present illness. Otherwise, complete review of systems is positive for none.  All other systems are reviewed and negative.   Physical Exam: VS:  BP (!) 160/80   Pulse (!) 57   Ht 6' (1.829 m)   Wt 215 lb (97.5 kg)   SpO2 97%   BMI 29.16 kg/m , BMI Body mass  index is 29.16 kg/m.  Wt Readings from Last 3 Encounters:  10/15/22 215 lb (97.5 kg)  10/12/22 220 lb (99.8 kg)  09/28/22 220 lb (99.8 kg)    General: Patient appears comfortable at rest. HEENT: Conjunctiva and lids normal, oropharynx clear with moist mucosa. Neck: Supple, no elevated JVP or carotid bruits, no thyromegaly. Lungs: Clear to auscultation, nonlabored breathing at rest. Cardiac: Regular rate and rhythm, no S3 or significant systolic murmur, no pericardial rub. Abdomen: Soft, nontender, no hepatomegaly, bowel sounds present, no guarding or rebound. Extremities: No pitting edema, distal pulses 2+. Skin: Warm and dry. Musculoskeletal: No kyphosis. Neuropsychiatric: Alert and oriented x3, affect grossly appropriate.  ECG:  NSR  Recent Labwork: 02/19/2022: ALT 22; AST 22 09/30/2022: BUN 13; Creatinine, Ser 1.67; Hemoglobin 14.7; Magnesium 1.7; Platelets 150; Potassium 4.1; Sodium 137     Component Value Date/Time   CHOL 182 09/30/2022 0256   TRIG 123 09/30/2022 0256   HDL 40 (L) 09/30/2022 0256   CHOLHDL 4.6 09/30/2022 0256   VLDL 25 09/30/2022 0256   LDLCALC 117 (H) 09/30/2022 0256   LDLCALC 68 03/27/2021 1237    Other Studies Reviewed Today:   Assessment and Plan: Patient is a 61 y/o M known to have CAD s/p RCA and LCX PCI in 2009 with normal LVEF, HTN, HLD, COPD, prostrate cancer s/p seed implantation and brachytherapy, Stage III CKD (prior  right nephrectomy in 2003) presented to the cardiology clinic for follow-up.  # CAD s/p RCA and LCX PCI with normal LVEF, currently angina free -Continue aspirin 81 mg once daiky -Continue atorvastatin 80 mg QHS and Zetia 10 mg once daily -Switch Metoprolol to Carvedilol 6.25 mg BID -Continue Lisinopril 20 mg once daily (follows up with nephrology and recommended lisinopril due to proteinuria) '-ER precautions for chest pain provided  # HLD, currently not at goal -Continue atorvastatin 80 mg QHS and Zetia 10 mg once daily. Repeat Lipid panel in 3 months. Goal LDL< 70 mg/dL.  # HTN, poorly controlled -Start Amlodipine 5 mg once daily -Switch metoprolol to carvedilol 6.'25mg'$  BID -Continue lisinopril 20 mg once daily -Start Hydralazine '25mg'$  TID. Unable to add ISDN due to concurrent Viagra. -Instructed patient to check AM and PM BP pressures. Keep a log of BP readings and bring in the next visit.  I have spent a total of 33 minutes with patient reviewing chart, EKGs, labs and examining patient as well as establishing an assessment and plan that was discussed with the patient.  > 50% of time was spent in direct patient care.      Medication Adjustments/Labs and Tests Ordered: Current medicines are reviewed at length with the patient today.  Concerns regarding medicines are outlined above.   Tests Ordered: No orders of the defined types were placed in this encounter.   Medication Changes: Meds ordered this encounter  Medications   amLODipine (NORVASC) 5 MG tablet    Sig: Take 1 tablet (5 mg total) by mouth daily.    Dispense:  90 tablet    Refill:  3   hydrALAZINE (APRESOLINE) 25 MG tablet    Sig: Take 1 tablet (25 mg total) by mouth 3 (three) times daily.    Dispense:  270 tablet    Refill:  3   carvedilol (COREG) 6.25 MG tablet    Sig: Take 1 tablet (6.25 mg total) by mouth 2 (two) times daily.    Dispense:  180 tablet    Refill:  3    Disposition:  Follow up one  month  Signed, Cheryllynn Sarff Fidel Levy, MD, 10/18/2022 3:03 PM    [facility] [address], [place], [place] [address]

## 2022-10-28 ENCOUNTER — Other Ambulatory Visit: Payer: Self-pay | Admitting: Family Medicine

## 2022-11-10 ENCOUNTER — Other Ambulatory Visit: Payer: Self-pay | Admitting: Family Medicine

## 2022-11-10 DIAGNOSIS — M1000 Idiopathic gout, unspecified site: Secondary | ICD-10-CM

## 2022-11-10 NOTE — Telephone Encounter (Signed)
Patient needs OV, will refill medication for 30 days until OV can be made. OV needed for additional refills.  Requested Prescriptions  Pending Prescriptions Disp Refills   allopurinol (ZYLOPRIM) 100 MG tablet [Pharmacy Med Name: ALLOPURINOL 100 MG TABLET] 60 tablet 0    Sig: TAKE 2 TABLETS BY MOUTH DAILY.     Endocrinology:  Gout Agents - allopurinol Failed - 11/10/2022 11:29 AM      Failed - Uric Acid in normal range and within 360 days    Uric Acid, Serum  Date Value Ref Range Status  12/02/2018 7.2 3.7 - 8.6 mg/dL Final    Comment:    Performed at [facility], [address], [place], [place] [address]         Failed - Cr in normal range and within 360 days    Creat  Date Value Ref Range Status  03/27/2021 1.46 (H) 0.70 - 1.25 mg/dL Final    Comment:    For patients >79 years of age, the reference limit for Creatinine is approximately 13% higher for people identified as African-American. .    Creatinine, Ser  Date Value Ref Range Status  09/30/2022 1.67 (H) 0.61 - 1.24 mg/dL Final         Failed - Valid encounter within last 12 months    Recent Outpatient Visits           1 year ago ASCVD (arteriosclerotic cardiovascular disease)   [place] Pickard, [name], MD   2 years ago Pure hypercholesterolemia   [place] Dennard Schaumann, [name], MD   2 years ago Benign essential HTN   [place] [name], MD   3 years ago Acute idiopathic gout, unspecified site   [place] Pickard, [name], MD   3 years ago Acute bacterial rhinosinusitis   [place] Pickard, [name], MD       Future Appointments             In 1 week Mallipeddi, Quenten Raven, MD [place] [place], [place]   In 2 months Franchot Gallo, MD [facility]            Passed - CBC within normal limits and completed in the last 12  months    WBC  Date Value Ref Range Status  09/30/2022 5.2 4.0 - 10.5 K/uL Final   RBC  Date Value Ref Range Status  09/30/2022 4.76 4.22 - 5.81 MIL/uL Final   Hemoglobin  Date Value Ref Range Status  09/30/2022 14.7 13.0 - 17.0 g/dL Final   HCT  Date Value Ref Range Status  09/30/2022 43.4 39.0 - 52.0 % Final   MCHC  Date Value Ref Range Status  09/30/2022 33.9 30.0 - 36.0 g/dL Final   [facility]  Date Value Ref Range Status  09/30/2022 30.9 26.0 - 34.0 pg Final   MCV  Date Value Ref Range Status  09/30/2022 91.2 80.0 - 100.0 fL Final   No results found for: "PLTCOUNTKUC", "LABPLAT", "POCPLA" RDW  Date Value Ref Range Status  09/30/2022 12.8 11.5 - 15.5 % Final

## 2022-11-15 ENCOUNTER — Other Ambulatory Visit: Payer: Self-pay | Admitting: Family Medicine

## 2022-11-15 DIAGNOSIS — F411 Generalized anxiety disorder: Secondary | ICD-10-CM

## 2022-11-19 ENCOUNTER — Encounter: Payer: Self-pay | Admitting: Internal Medicine

## 2022-11-19 ENCOUNTER — Ambulatory Visit: Payer: PPO | Attending: Internal Medicine | Admitting: Internal Medicine

## 2022-11-19 VITALS — BP 140/68 | HR 65 | Ht 72.0 in | Wt 215.6 lb

## 2022-11-19 DIAGNOSIS — I160 Hypertensive urgency: Secondary | ICD-10-CM

## 2022-11-19 DIAGNOSIS — Z9861 Coronary angioplasty status: Secondary | ICD-10-CM | POA: Diagnosis not present

## 2022-11-19 DIAGNOSIS — I2510 Atherosclerotic heart disease of native coronary artery without angina pectoris: Secondary | ICD-10-CM | POA: Diagnosis not present

## 2022-11-19 DIAGNOSIS — I10 Essential (primary) hypertension: Secondary | ICD-10-CM

## 2022-11-19 MED ORDER — AMLODIPINE BESYLATE 5 MG PO TABS: 5.0000 mg | ORAL_TABLET | Freq: Two times a day (BID) | ORAL | 1 refills | Status: DC

## 2022-11-19 NOTE — Patient Instructions (Addendum)
Medication Instructions:  Your physician has recommended you make the following change in your medication:  Change amlodipine 5 mg to twice daily Continue other medications the same  Labwork: none  Testing/Procedures: none  Follow-Up: Your physician recommends that you schedule a follow-up appointment in: 3 months  Any Other Special Instructions Will Be Listed Below (If Applicable).  If you need a refill on your cardiac medications before your next appointment, please call your pharmacy.

## 2022-11-19 NOTE — Progress Notes (Signed)
Cardiology Office Note  Date: 11/19/2022   ID: Brandon Buck, Brandon Buck June 01, 1961, MRN [id number]  PCP:  Brandon Frizzle, MD  Cardiologist:  Brandon Guest, MD Electrophysiologist:  None   Reason for Office Visit: HTN management   History of Present Illness: Brandon Buck is a 62 y.o. male known to have CAD s/p RCA and LCX PCI in 2009 with normal LVEF, HTN, HLD, COPD, prostrate cancer s/p seed implantation and brachytherapy, Stage III CKD (prior right nephrectomy in 2003) presented to the cardiology clinic for follow-up.  Patient underwent LHC in 09/2022 for unstable angina which showed patent RCA and LCX stents and mild non-obstructive CAD diffusely. His chest pains are likely secondary to uncontrolled HTN. He presents today to the clinic for follow-up visit.  He brought a blood pressure log, he had elevated blood pressures between 160 to 180 mmHg in the a.m. and p.m. Otherwise, throughout the day, his blood pressures seem to be controlled between 130 to 140 mmHg. These blood pressures were better compared to prior blood pressures of 200/more than 200 mmHg SBP. He denies having any chest pains.  Denies any SOB, dizziness, lightheadedness, syncope, leg swelling and palpitations.  Past Medical History:  Diagnosis Date   Anxiety    Arthritis    Asthma    Cancer ([place])    right renal cancer, right radical nephrectomy 2004   Chronic back pain    Chronic hip pain, left    CKD (chronic kidney disease) stage 3, GFR 30-59 ml/min (HCC)    ckd stage 3 b    Complication of anesthesia    1 seizure after anesthesia 1999 or 2000 none since , recent surgeries no seizures   Coronary artery disease    a. s/p prior PCI to RCA and LCx in 2009   Depression    GERD (gastroesophageal reflux disease)    Gout    no recent flares   Heart disease    High blood pressure    History of PTCA 2 2004   2 stents   History of stomach ulcers    Hypercholesteremia    Hypertension    Kidney disease     Myocardial infarction ([place]) 2004   Prostate cancer ([place])    3/12 cores upt to 40% grade 2 cancer. Planning brachytherapy (2021)   Proteinuria    Renal insufficiency    Tennis elbow     Past Surgical History:  Procedure Laterality Date   COLONOSCOPY WITH PROPOFOL N/A 10/28/2020   Procedure: COLONOSCOPY WITH PROPOFOL;  Surgeon: Harvel Quale, MD;  [facility];  Service: Gastroenterology;  Laterality: N/A;  2:30   coloscopy  10/28/2020   removed 7 polyps done at [place] N/A 11/03/2020   Procedure: CYSTOSCOPY;  Surgeon: Franchot Gallo, MD;  [facility]: [facility];  Service: Urology;  Laterality: N/A;   Heart catherization  2004   2 stents to heart cypher left circulflex   HERNIA REPAIR  1999   left groin bilateral   HIP SURGERY Left 1999 or 2000   bond graft   Kidney removed Right 2003 or 2004   for cancer   LEFT HEART CATH AND CORONARY ANGIOGRAPHY N/A 09/29/2022   Procedure: LEFT HEART CATH AND CORONARY ANGIOGRAPHY;  Surgeon: Jettie Booze, MD;  Location: [facility];  Service: Cardiovascular;  Laterality: N/A;   POLYPECTOMY  10/28/2020   Procedure: POLYPECTOMY INTESTINAL;  Surgeon: Harvel Quale, MD;  [facility];  Service: Gastroenterology;;   PROSTATE BIOPSY  2021   RADIOACTIVE SEED IMPLANT N/A 11/03/2020   Procedure: RADIOACTIVE SEED IMPLANT/BRACHYTHERAPY IMPLANT;  Surgeon: Franchot Gallo, MD;  Location[facility];  Service: Urology;  Laterality: N/A;  [place] N/A 11/03/2020   Procedure: SPACE OAR INSTILLATION;  Surgeon: Franchot Gallo, MD;  Location: [facility];  Service: Urology;  Laterality: N/A;    Current Outpatient Medications  Medication Sig Dispense Refill   acetaminophen (TYLENOL) 500 MG tablet Take 500 mg by mouth every 6 (six) hours as needed.     allopurinol (ZYLOPRIM) 100 MG tablet TAKE 2 TABLETS BY MOUTH DAILY.  60 tablet 0   ALPRAZolam (XANAX) 0.5 MG tablet TAKE (1) TABLET BY MOUTH TWICE DAILY AS NEEDED FOR ANXIETY 30 tablet 0   amLODipine (NORVASC) 5 MG tablet Take 1 tablet (5 mg total) by mouth daily. 90 tablet 3   aspirin 81 MG tablet Take 81 mg by mouth daily.     atorvastatin (LIPITOR) 80 MG tablet TAKE (1) TABLET BY MOUTH ONCE DAILY. 90 tablet 0   carvedilol (COREG) 6.25 MG tablet Take 1 tablet (6.25 mg total) by mouth 2 (two) times daily. 180 tablet 3   ezetimibe (ZETIA) 10 MG tablet TAKE ONE TABLET BY MOUTH ONCE DAILY. 30 tablet 0   furosemide (LASIX) 20 MG tablet Take 20 mg by mouth daily as needed for edema or fluid.     hydrALAZINE (APRESOLINE) 25 MG tablet Take 1 tablet (25 mg total) by mouth 3 (three) times daily. 270 tablet 3   HYDROcodone-acetaminophen (NORCO/VICODIN) 5-325 MG tablet TAKE 1 TABLET BY MOUTH (3) TIMES DAILY AS NEEDED FOR MODERATE PAIN. 90 tablet 0   lisinopril (ZESTRIL) 20 MG tablet Take 20 mg by mouth daily.     NICOTROL 10 MG inhaler INHALE 1 CARTRIDGE INTO THE LUNGS AS NEEDED FOR SMOKING CESSATION. 168 each 0   nitroGLYCERIN (NITROSTAT) 0.4 MG SL tablet Place 1 tablet (0.4 mg total) under the tongue every 5 (five) minutes as needed. Do not take within 2 days of viagra. 25 tablet 3   pantoprazole (PROTONIX) 40 MG tablet TAKE ONE TABLET BY MOUTH TWICE DAILY. 60 tablet 0   sildenafil (VIAGRA) 100 MG tablet Take 1 tablet (100 mg total) by mouth as needed for erectile dysfunction. 15 tablet prn   tamsulosin (FLOMAX) 0.4 MG CAPS capsule Take 1 capsule (0.4 mg total) by mouth daily after supper. 90 capsule 11   tiZANidine (ZANAFLEX) 4 MG tablet Take 1 tablet (4 mg total) by mouth at bedtime as needed for muscle spasms. Do not take with alcohol or while driving or operating heavy machinery (Patient not taking: Reported on 11/19/2022) 30 tablet 0   No current facility-administered medications for this visit.   Allergies:  Other and Keflex [cephalexin]   Social History: The  patient  reports that he quit smoking about 19 years ago. His smoking use included cigarettes. He started smoking about 51 years ago. He has a 48.00 pack-year smoking history. He has never used smokeless tobacco. He reports current alcohol use. He reports that he does not use drugs.   Family History: The patient's family history includes Bladder Cancer in his maternal grandfather; Cancer in an other family member; Heart disease in his father, mother, and another family member; Kidney cancer in his mother; Kidney disease in an other family member; Lung cancer in his mother; Prostate cancer in his father and  maternal uncle.   ROS:  Please see the history of present illness. Otherwise, complete review of systems is positive for none.  All other systems are reviewed and negative.   Physical Exam: VS:  BP (!) 140/68   Pulse 65   Ht 6' (1.829 m)   Wt 215 lb 9.6 oz (97.8 kg)   SpO2 96%   BMI 29.24 kg/m , BMI Body mass index is 29.24 kg/m.  Wt Readings from Last 3 Encounters:  11/19/22 215 lb 9.6 oz (97.8 kg)  10/15/22 215 lb (97.5 kg)  10/12/22 220 lb (99.8 kg)    General: Patient appears comfortable at rest. HEENT: Conjunctiva and lids normal, oropharynx clear with moist mucosa. Neck: Supple, no elevated JVP or carotid bruits, no thyromegaly. Lungs: Clear to auscultation, nonlabored breathing at rest. Cardiac: Regular rate and rhythm, no S3 or significant systolic murmur, no pericardial rub. Abdomen: Soft, nontender, no hepatomegaly, bowel sounds present, no guarding or rebound. Extremities: No pitting edema, distal pulses 2+. Skin: Warm and dry. Musculoskeletal: No kyphosis. Neuropsychiatric: Alert and oriented x3, affect grossly appropriate.  ECG:  NSR  Recent Labwork: 02/19/2022: ALT 22; AST 22 09/30/2022: BUN 13; Creatinine, Ser 1.67; Hemoglobin 14.7; Magnesium 1.7; Platelets 150; Potassium 4.1; Sodium 137     Component Value Date/Time   CHOL 182 09/30/2022 0256   TRIG 123  09/30/2022 0256   HDL 40 (L) 09/30/2022 0256   CHOLHDL 4.6 09/30/2022 0256   VLDL 25 09/30/2022 0256   LDLCALC 117 (H) 09/30/2022 0256   LDLCALC 68 03/27/2021 1237    Other Studies Reviewed Today:   Assessment and Plan: Patient is a 62 y/o M known to have CAD s/p RCA and LCX PCI in 2009 with normal LVEF, HTN, HLD, COPD, prostrate cancer s/p seed implantation and brachytherapy, Stage III CKD (prior right nephrectomy in 2003) presented to the cardiology clinic for follow-up.  # CAD s/p RCA and LCX PCI with normal LVEF, currently angina free -Continue aspirin 81 mg once daily -Continue atorvastatin 80 mg nightly and Zetia 10 mg once daily -Continue carvedilol 6.25 mg twice daily -Continue lisinopril 20 mg once daily (follows up with nephrology and recommended lisinopril due to proteinuria.  He was earlier on lisinopril 40 mg resulting in hyperkalemia and hence the dose had to be decreased). -ER precautions for chest pain  # HLD, currently not at goal -Continue atorvastatin 80 mg nightly and Zetia 10 mg once daily.  Goal LDL less than 70.  Patient ran out of Zetia 1 week ago and was also not taking his anticholesterol medications prior to hospitalization in 12/23 due to frequent hospitalizations. Lipid panel will need to be rechecked in 3 months.  # HTN, poorly controlled -Switch amlodipine 5 mg from once daily to twice daily -Continue carvedilol 6.25 mg twice daily -Continue lisinopril 20 mg once daily -Continue hydralazine 25 mg 3 times a day -Not a candidate for nitrates due to sildenafil use -Blood pressure check in a.m. and p.m.  I have spent a total of 33 minutes with patient reviewing chart, EKGs, labs and examining patient as well as establishing an assessment and plan that was discussed with the patient.  > 50% of time was spent in direct patient care.      Medication Adjustments/Labs and Tests Ordered: Current medicines are reviewed at length with the patient today.   Concerns regarding medicines are outlined above.   Tests Ordered: No orders of the defined types were placed in this encounter.  Medication Changes: No orders of the defined types were placed in this encounter.   Disposition:  Follow up 3 months  Signed, Trishna Cwik Fidel Levy, MD, 11/19/2022 10:40 AM    [facility] [place]. [address] [place], [place] [address]

## 2022-11-24 ENCOUNTER — Other Ambulatory Visit: Payer: Self-pay | Admitting: Family Medicine

## 2022-11-24 NOTE — Telephone Encounter (Signed)
Requested medication (s) are due for refill today: yes  Requested medication (s) are on the active medication list: yes    Last refill: 11/18/21  #90  0 refills  Future visit scheduled no  Notes to clinic:    Pt needs appt, please review. Thank you.  Requested Prescriptions  Pending Prescriptions Disp Refills   atorvastatin (LIPITOR) 80 MG tablet [Pharmacy Med Name: ATORVASTATIN 80 MG TABLET] 90 tablet 0    Sig: TAKE (1) TABLET BY MOUTH ONCE DAILY.     Cardiovascular:  Antilipid - Statins Failed - 11/24/2022  9:21 AM      Failed - Valid encounter within last 12 months    Recent Outpatient Visits           1 year ago ASCVD (arteriosclerotic cardiovascular disease)   [place] Pickard, [name], MD   2 years ago Pure hypercholesterolemia   [place] Dennard Schaumann, [name], MD   2 years ago Benign essential HTN   [place] [name], MD   3 years ago Acute idiopathic gout, unspecified site   [place] Pickard, [name], MD   3 years ago Acute bacterial rhinosinusitis   [place] Pickard, [name], MD       Future Appointments             In 1 month Franchot Gallo, MD [facility]   In 2 months Mallipeddi, Quenten Raven, MD [place] [facility] [place] [place]            Failed - Lipid Panel in normal range within the last 12 months    Cholesterol  Date Value Ref Range Status  09/30/2022 182 0 - 200 mg/dL Final   LDL Cholesterol (Calc)  Date Value Ref Range Status  03/27/2021 68 mg/dL (calc) Final    Comment:    Reference range: <100 . Desirable range <100 mg/dL for primary prevention;   <70 mg/dL for patients with CHD or diabetic patients  with > or = 2 CHD risk factors. Marland Kitchen LDL-C is now calculated using the Martin-Hopkins  calculation, which is a validated novel method providing  better accuracy than the Friedewald equation in the   estimation of LDL-C.  Cresenciano Genre et al. Annamaria Helling. 2952;841(32): 2061-2068  (http://education.[employer].com/faq/FAQ164)    LDL Cholesterol  Date Value Ref Range Status  09/30/2022 117 (H) 0 - 99 mg/dL Final    Comment:           Total Cholesterol/HDL:CHD Risk Coronary Heart Disease Risk Table                     Men   Women  1/2 Average Risk   3.4   3.3  Average Risk       5.0   4.4  2 X Average Risk   9.6   7.1  3 X Average Risk  23.4   11.0        Use the calculated Patient Ratio above and the CHD Risk Table to determine the patient's CHD Risk.        ATP III CLASSIFICATION (LDL):  <100     mg/dL   Optimal  100-129  mg/dL   Near or Above                    Optimal  130-159  mg/dL   Borderline  160-189  mg/dL   High  >  190     mg/dL   Very High Performed at [facility], [place] [address], [place], [place] [address]    HDL  Date Value Ref Range Status  09/30/2022 40 (L) >40 mg/dL Final   Triglycerides  Date Value Ref Range Status  09/30/2022 123 <150 mg/dL Final         Passed - Patient is not pregnant

## 2022-12-08 ENCOUNTER — Other Ambulatory Visit: Payer: Self-pay | Admitting: Family Medicine

## 2022-12-08 DIAGNOSIS — M1000 Idiopathic gout, unspecified site: Secondary | ICD-10-CM

## 2022-12-14 ENCOUNTER — Other Ambulatory Visit: Payer: Self-pay | Admitting: Family Medicine

## 2022-12-14 DIAGNOSIS — F411 Generalized anxiety disorder: Secondary | ICD-10-CM

## 2023-01-11 ENCOUNTER — Other Ambulatory Visit: Payer: Self-pay | Admitting: Family Medicine

## 2023-01-11 DIAGNOSIS — F411 Generalized anxiety disorder: Secondary | ICD-10-CM

## 2023-01-14 ENCOUNTER — Other Ambulatory Visit: Payer: PPO

## 2023-01-14 DIAGNOSIS — C61 Malignant neoplasm of prostate: Secondary | ICD-10-CM | POA: Diagnosis not present

## 2023-01-15 LAB — PSA: Prostate Specific Ag, Serum: 0.2 ng/mL (ref 0.0–4.0)

## 2023-01-17 NOTE — Progress Notes (Signed)
History of Present Illness:    7.27.2021: TRUS/Bx.  Prostate volume 34.7 mL, PSA 5.3, PSAD 0.15.  3/12 cores revealed adenocarcinoma as follows:  Left apex lateral GS 3+3 and 95% of core Right apex medial GS 3+4 and 40% of core Right mid lateral, GS 3+3 in 10% of core   1.10.2022: I-125 brachytherapy, placement of SpaceOAR 9.20.2022: PSA 0.7 3.21.2023: PSA 1.0. 9.26.2023: PSA 1.1.   3.26.2024: PSA 0.2.  He is here today for routine check.  Is been doing well.  IPSS 15, quality-of-life score 2.  He is no longer on tamsulosin.  He has had no blood per urine or stool.    Past Medical History:  Diagnosis Date   Anxiety    Arthritis    Asthma    Cancer ([place])    right renal cancer, right radical nephrectomy 2004   Chronic back pain    Chronic hip pain, left    CKD (chronic kidney disease) stage 3, GFR 30-59 ml/min (HCC)    ckd stage 3 b    Complication of anesthesia    1 seizure after anesthesia 1999 or 2000 none since , recent surgeries no seizures   Coronary artery disease    a. s/p prior PCI to RCA and LCx in 2009   Depression    GERD (gastroesophageal reflux disease)    Gout    no recent flares   Heart disease    High blood pressure    History of PTCA 2 2004   2 stents   History of stomach ulcers    Hypercholesteremia    Hypertension    Kidney disease    Myocardial infarction ([place]) 2004   Prostate cancer ([place])    3/12 cores upt to 40% grade 2 cancer. Planning brachytherapy (2021)   Proteinuria    Renal insufficiency    Tennis elbow     Past Surgical History:  Procedure Laterality Date   COLONOSCOPY WITH PROPOFOL N/A 10/28/2020   Procedure: COLONOSCOPY WITH PROPOFOL;  Surgeon: Harvel Quale, MD;  [facility];  Service: Gastroenterology;  Laterality: N/A;  2:30   coloscopy  10/28/2020   removed 7 polyps done at [place] N/A 11/03/2020   Procedure: CYSTOSCOPY;  Surgeon: Franchot Gallo, MD;  Location: [facility];  Service: Urology;  Laterality: N/A;   Heart catherization  2004   2 stents to heart cypher left circulflex   HERNIA REPAIR  1999   left groin bilateral   HIP SURGERY Left 1999 or 2000   bond graft   Kidney removed Right 2003 or 2004   for cancer   LEFT HEART CATH AND CORONARY ANGIOGRAPHY N/A 09/29/2022   Procedure: LEFT HEART CATH AND CORONARY ANGIOGRAPHY;  Surgeon: Jettie Booze, MD;  Location: [facility];  Service: Cardiovascular;  Laterality: N/A;   POLYPECTOMY  10/28/2020   Procedure: POLYPECTOMY INTESTINAL;  Surgeon: Harvel Quale, MD;  [facility];  Service: Gastroenterology;;   PROSTATE BIOPSY  2021   RADIOACTIVE SEED IMPLANT N/A 11/03/2020   Procedure: RADIOACTIVE SEED IMPLANT/BRACHYTHERAPY IMPLANT;  Surgeon: Franchot Gallo, MD;  [facility];  Service: Urology;  Laterality: N/A;  [place] N/A 11/03/2020   Procedure: SPACE OAR INSTILLATION;  Surgeon: Franchot Gallo, MD;  [facility];  Service: Urology;  Laterality: N/A;    Home Medications:  Allergies as of 01/18/2023  Reactions   Other    Pt. Reports having seizures after anesthesia once 1999 or 2000 none since   Keflex [cephalexin] Rash        Medication List        Accurate as of January 17, 2023  7:52 PM. If you have any questions, ask your nurse or doctor.          acetaminophen 500 MG tablet Commonly known as: TYLENOL Take 500 mg by mouth every 6 (six) hours as needed.   allopurinol 100 MG tablet Commonly known as: ZYLOPRIM TAKE 2 TABLETS BY MOUTH DAILY.   ALPRAZolam 0.5 MG tablet Commonly known as: XANAX TAKE (1) TABLET BY MOUTH TWICE DAILY AS NEEDED FOR ANXIETY   amLODipine 5 MG tablet Commonly known as: NORVASC Take 1 tablet (5 mg total) by mouth 2 (two) times daily.   aspirin 81 MG tablet Take 81 mg by mouth daily.   atorvastatin 80 MG tablet Commonly known as:  LIPITOR TAKE (1) TABLET BY MOUTH ONCE DAILY.   carvedilol 6.25 MG tablet Commonly known as: COREG Take 1 tablet (6.25 mg total) by mouth 2 (two) times daily.   ezetimibe 10 MG tablet Commonly known as: ZETIA TAKE ONE TABLET BY MOUTH ONCE DAILY.   furosemide 20 MG tablet Commonly known as: LASIX Take 20 mg by mouth daily as needed for edema or fluid.   hydrALAZINE 25 MG tablet Commonly known as: APRESOLINE Take 1 tablet (25 mg total) by mouth 3 (three) times daily.   HYDROcodone-acetaminophen 5-325 MG tablet Commonly known as: NORCO/VICODIN TAKE 1 TABLET BY MOUTH (3) TIMES DAILY AS NEEDED FOR MODERATE PAIN.   lisinopril 20 MG tablet Commonly known as: ZESTRIL Take 20 mg by mouth daily.   Nicotrol 10 MG inhaler Generic drug: nicotine INHALE 1 CARTRIDGE INTO THE LUNGS AS NEEDED FOR SMOKING CESSATION.   nitroGLYCERIN 0.4 MG SL tablet Commonly known as: NITROSTAT Place 1 tablet (0.4 mg total) under the tongue every 5 (five) minutes as needed. Do not take within 2 days of viagra.   pantoprazole 40 MG tablet Commonly known as: PROTONIX TAKE ONE TABLET BY MOUTH TWICE DAILY.   sildenafil 100 MG tablet Commonly known as: VIAGRA Take 1 tablet (100 mg total) by mouth as needed for erectile dysfunction.   tamsulosin 0.4 MG Caps capsule Commonly known as: FLOMAX Take 1 capsule (0.4 mg total) by mouth daily after supper.   tiZANidine 4 MG tablet Commonly known as: Zanaflex Take 1 tablet (4 mg total) by mouth at bedtime as needed for muscle spasms. Do not take with alcohol or while driving or operating heavy machinery        Allergies:  Allergies  Allergen Reactions   Other     Pt. Reports having seizures after anesthesia once 1999 or 2000 none since   Keflex [Cephalexin] Rash    Family History  Problem Relation Age of Onset   Heart disease Other    Cancer Other    Kidney disease Other    Heart disease Father    Prostate cancer Father    Heart disease Mother     Lung cancer Mother    Kidney cancer Mother    Prostate cancer Maternal Uncle    Bladder Cancer Maternal Grandfather    Breast cancer Neg Hx    Colon cancer Neg Hx    Pancreatic cancer Neg Hx     Social History:  reports that he quit smoking about 19 years ago. His smoking use  included cigarettes. He started smoking about 52 years ago. He has a 48.00 pack-year smoking history. He has never used smokeless tobacco. He reports current alcohol use. He reports that he does not use drugs.  ROS: A complete review of systems was performed.  All systems are negative except for pertinent findings as noted.  Physical Exam:  Vital signs in last 24 hours: There were no vitals taken for this visit. Constitutional:  Alert and oriented, No acute distress Cardiovascular: Regular rate  Respiratory: Normal respiratory effort GI: Abdomen is soft, nontender, nondistended, no abdominal masses. No CVAT.  Genitourinary: Normal male phallus, testes are descended bilaterally and non-tender and without masses, scrotum is normal in appearance without lesions or masses, perineum is normal on inspection. Lymphatic: No lymphadenopathy Neurologic: Grossly intact, no focal deficits Psychiatric: Normal mood and affect  I have reviewed prior pt notes  I have reviewed notes from referring/previous physicians  I have reviewed urinalysis results-clear  I have independently reviewed prior imaging  I have reviewed prior PSA and pathology results  I have reviewed IPSS form   Impression/Assessment:  History of grade group 2 prostate cancer, status post brachytherapy January 2022 with excellent PSA response  BPH with symptoms, not that bothersome, not on medical therapy  ED, on sildenafil  Plan:  I will have him come back in 6 months following PSA

## 2023-01-18 ENCOUNTER — Encounter: Payer: Self-pay | Admitting: Urology

## 2023-01-18 ENCOUNTER — Ambulatory Visit (INDEPENDENT_AMBULATORY_CARE_PROVIDER_SITE_OTHER): Payer: PPO | Admitting: Urology

## 2023-01-18 VITALS — BP 146/78 | HR 60 | Ht 72.0 in | Wt 215.6 lb

## 2023-01-18 DIAGNOSIS — N401 Enlarged prostate with lower urinary tract symptoms: Secondary | ICD-10-CM

## 2023-01-18 DIAGNOSIS — N529 Male erectile dysfunction, unspecified: Secondary | ICD-10-CM

## 2023-01-18 DIAGNOSIS — N138 Other obstructive and reflux uropathy: Secondary | ICD-10-CM

## 2023-01-18 DIAGNOSIS — Z8546 Personal history of malignant neoplasm of prostate: Secondary | ICD-10-CM | POA: Diagnosis not present

## 2023-01-18 LAB — URINALYSIS, ROUTINE W REFLEX MICROSCOPIC
Bilirubin, UA: NEGATIVE
Glucose, UA: NEGATIVE
Ketones, UA: NEGATIVE
Leukocytes,UA: NEGATIVE
Nitrite, UA: NEGATIVE
RBC, UA: NEGATIVE
Specific Gravity, UA: 1.03 (ref 1.005–1.030)
Urobilinogen, Ur: 1 mg/dL (ref 0.2–1.0)
pH, UA: 5.5 (ref 5.0–7.5)

## 2023-01-18 LAB — MICROSCOPIC EXAMINATION: Bacteria, UA: NONE SEEN

## 2023-01-25 ENCOUNTER — Other Ambulatory Visit: Payer: Self-pay | Admitting: Family Medicine

## 2023-01-25 DIAGNOSIS — M1000 Idiopathic gout, unspecified site: Secondary | ICD-10-CM

## 2023-02-03 ENCOUNTER — Other Ambulatory Visit: Payer: Self-pay | Admitting: Family Medicine

## 2023-02-03 DIAGNOSIS — M1000 Idiopathic gout, unspecified site: Secondary | ICD-10-CM

## 2023-02-03 NOTE — Telephone Encounter (Signed)
Requested medication (s) are due for refill today: yes  Requested medication (s) are on the active medication list: yes  Last refill:  12/08/22  Future visit scheduled: yes  Notes to clinic:  Unable to refill per protocol, courtesy refill already given, routing for provider approval.  Future OV schedule 02/10/23, routing for approval due to being recently refused.     Requested Prescriptions  Pending Prescriptions Disp Refills   allopurinol (ZYLOPRIM) 100 MG tablet [Pharmacy Med Name: ALLOPURINOL 100 MG TABLET] 60 tablet 0    Sig: TAKE 2 TABLETS BY MOUTH DAILY.     Endocrinology:  Gout Agents - allopurinol Failed - 02/03/2023  9:19 AM      Failed - Uric Acid in normal range and within 360 days    Uric Acid, Serum  Date Value Ref Range Status  12/02/2018 7.2 3.7 - 8.6 mg/dL Final    Comment:    Performed at [facility], [address] [place], [place] [address]         Failed - Cr in normal range and within 360 days    Creat  Date Value Ref Range Status  03/27/2021 1.46 (H) 0.70 - 1.25 mg/dL Final    Comment:    For patients >30 years of age, the reference limit for Creatinine is approximately 13% higher for people identified as African-American. .    Creatinine, Ser  Date Value Ref Range Status  09/30/2022 1.67 (H) 0.61 - 1.24 mg/dL Final         Failed - Valid encounter within last 12 months    Recent Outpatient Visits           1 year ago ASCVD (arteriosclerotic cardiovascular disease)   [facility] Pickard, Priscille Heidelberg, MD   2 years ago Pure hypercholesterolemia   [facility] Tanya Nones, Priscille Heidelberg, MD   3 years ago Benign essential HTN   [facility] Donita Brooks, MD   3 years ago Acute idiopathic gout, unspecified site   [facility] Pickard, Priscille Heidelberg, MD   4 years ago Acute bacterial rhinosinusitis   [facility] Pickard, Priscille Heidelberg, MD       Future Appointments              In 4 days Mallipeddi, Orion Modest, MD [facility] [place], [place]   In 1 week Pickard, Priscille Heidelberg, MD [facility]   In 5 months Marcine Matar, MD [facility] [place]            Passed - CBC within normal limits and completed in the last 12 months    WBC  Date Value Ref Range Status  09/30/2022 5.2 4.0 - 10.5 K/uL Final   RBC  Date Value Ref Range Status  09/30/2022 4.76 4.22 - 5.81 MIL/uL Final   Hemoglobin  Date Value Ref Range Status  09/30/2022 14.7 13.0 - 17.0 g/dL Final   HCT  Date Value Ref Range Status  09/30/2022 43.4 39.0 - 52.0 % Final   MCHC  Date Value Ref Range Status  09/30/2022 33.9 30.0 - 36.0 g/dL Final   [facility] [address]  Date Value Ref Range Status  09/30/2022 30.9 26.0 - 34.0 pg Final   MCV  Date Value Ref Range Status  09/30/2022 91.2 80.0 - 100.0 fL Final   No results found for: "PLTCOUNTKUC", "LABPLAT", "POCPLA" RDW  Date Value Ref Range Status  09/30/2022  12.8 11.5 - 15.5 % Final

## 2023-02-07 ENCOUNTER — Ambulatory Visit: Payer: PPO | Attending: Internal Medicine | Admitting: Internal Medicine

## 2023-02-07 ENCOUNTER — Encounter: Payer: Self-pay | Admitting: Internal Medicine

## 2023-02-07 VITALS — BP 148/70 | HR 64 | Ht 72.0 in | Wt 217.8 lb

## 2023-02-07 DIAGNOSIS — E7849 Other hyperlipidemia: Secondary | ICD-10-CM

## 2023-02-07 DIAGNOSIS — I10 Essential (primary) hypertension: Secondary | ICD-10-CM | POA: Diagnosis not present

## 2023-02-07 DIAGNOSIS — I2510 Atherosclerotic heart disease of native coronary artery without angina pectoris: Secondary | ICD-10-CM | POA: Diagnosis not present

## 2023-02-07 DIAGNOSIS — N1831 Chronic kidney disease, stage 3a: Secondary | ICD-10-CM | POA: Diagnosis not present

## 2023-02-07 DIAGNOSIS — Z9861 Coronary angioplasty status: Secondary | ICD-10-CM

## 2023-02-07 MED ORDER — CARVEDILOL 12.5 MG PO TABS: 12.5000 mg | ORAL_TABLET | Freq: Two times a day (BID) | ORAL | 1 refills | Status: DC

## 2023-02-07 MED ORDER — NIFEDIPINE ER OSMOTIC RELEASE 90 MG PO TB24: 90.0000 mg | ORAL_TABLET | Freq: Every evening | ORAL | 1 refills | Status: DC

## 2023-02-07 NOTE — Progress Notes (Signed)
Cardiology Office Note  Date: 02/07/2023   ID: WILBUR OAKLAND, DOB 10-25-1961, MRN [id number]  PCP:  Donita Brooks, MD  Cardiologist:  Marjo Bicker, MD Electrophysiologist:  None   Reason for Office Visit: HTN management and CAD follow-up   History of Present Illness: Brandon Buck is a 62 y.o. male known to have CAD s/p RCA and LCX PCI in 2009 with normal LVEF, HTN, HLD, COPD, prostrate cancer s/p seed implantation and brachytherapy, Stage III CKD (prior right nephrectomy in 2003) presented to the cardiology clinic for follow-up.  Patient underwent LHC in 09/2022 for unstable angina which showed patent RCA and LCX stents and mild non-obstructive CAD diffusely. His chest pains are likely secondary to uncontrolled HTN. He presents today to the clinic for follow-up visit.  He brought a blood pressure log, he had elevated blood pressures between 150 to 170 mmHg in the a.m. and p.m, but mostly in the p.m. Otherwise, throughout the day, his blood pressures seem to be controlled between 130 to 140 mmHg. These blood pressures were better compared to prior blood pressures of 200/more than 200 mmHg SBP.  He denies having any chest pains, SOB, dizziness, palpitations, leg swelling.   Past Medical History:  Diagnosis Date   Anxiety    Arthritis    Asthma    Cancer    right renal cancer, right radical nephrectomy 2004   Chronic back pain    Chronic hip pain, left    CKD (chronic kidney disease) stage 3, GFR 30-59 ml/min    ckd stage 3 b    Complication of anesthesia    1 seizure after anesthesia 1999 or 2000 none since , recent surgeries no seizures   Coronary artery disease    a. s/p prior PCI to RCA and LCx in 2009   Depression    GERD (gastroesophageal reflux disease)    Gout    no recent flares   Heart disease    High blood pressure    History of PTCA 2 2004   2 stents   History of stomach ulcers    Hypercholesteremia    Hypertension    Kidney disease     Myocardial infarction 2004   Prostate cancer    3/12 cores upt to 40% grade 2 cancer. Planning brachytherapy (2021)   Proteinuria    Renal insufficiency    Tennis elbow     Past Surgical History:  Procedure Laterality Date   COLONOSCOPY WITH PROPOFOL N/A 10/28/2020   Procedure: COLONOSCOPY WITH PROPOFOL;  Surgeon: Dolores Frame, MD;  [facility];  Service: Gastroenterology;  Laterality: N/A;  2:30   coloscopy  10/28/2020   removed 7 polyps done at [facility]   CYSTOSCOPY N/A 11/03/2020   Procedure: CYSTOSCOPY;  Surgeon: Marcine Matar, MD;  Location: [facility];  Service: Urology;  Laterality: N/A;   Heart catherization  2004   2 stents to heart cypher left circulflex   HERNIA REPAIR  1999   left groin bilateral   HIP SURGERY Left 1999 or 2000   bond graft   Kidney removed Right 2003 or 2004   for cancer   LEFT HEART CATH AND CORONARY ANGIOGRAPHY N/A 09/29/2022   Procedure: LEFT HEART CATH AND CORONARY ANGIOGRAPHY;  Surgeon: Corky Crafts, MD;  [facility];  Service: Cardiovascular;  Laterality: N/A;   POLYPECTOMY  10/28/2020   Procedure: POLYPECTOMY INTESTINAL;  Surgeon: Dolores Frame, MD;  [facility];  Service: Gastroenterology;;   PROSTATE BIOPSY  2021   RADIOACTIVE SEED IMPLANT N/A 11/03/2020   Procedure: RADIOACTIVE SEED IMPLANT/BRACHYTHERAPY IMPLANT;  Surgeon: Marcine Matar, MD;  Location: [facility];  Service: Urology;  Laterality: N/A;  90 MINS   SPACE OAR INSTILLATION N/A 11/03/2020   Procedure: SPACE OAR INSTILLATION;  Surgeon: Marcine Matar, MD;  Location: [facility];  Service: Urology;  Laterality: N/A;    Current Outpatient Medications  Medication Sig Dispense Refill   acetaminophen (TYLENOL) 500 MG tablet Take 500 mg by mouth every 6 (six) hours as needed.     allopurinol (ZYLOPRIM) 100 MG tablet TAKE 2 TABLETS BY MOUTH DAILY. 60 tablet 0    ALPRAZolam (XANAX) 0.5 MG tablet TAKE (1) TABLET BY MOUTH TWICE DAILY AS NEEDED FOR ANXIETY 30 tablet 0   aspirin 81 MG tablet Take 81 mg by mouth daily.     atorvastatin (LIPITOR) 80 MG tablet TAKE (1) TABLET BY MOUTH ONCE DAILY. 90 tablet 0   carvedilol (COREG) 12.5 MG tablet Take 1 tablet (12.5 mg total) by mouth 2 (two) times daily. 180 tablet 1   furosemide (LASIX) 20 MG tablet Take 20 mg by mouth daily as needed for edema or fluid.     hydrALAZINE (APRESOLINE) 25 MG tablet Take 1 tablet (25 mg total) by mouth 3 (three) times daily. 270 tablet 3   HYDROcodone-acetaminophen (NORCO/VICODIN) 5-325 MG tablet TAKE 1 TABLET BY MOUTH (3) TIMES DAILY AS NEEDED FOR MODERATE PAIN. 90 tablet 0   lisinopril (ZESTRIL) 20 MG tablet Take 20 mg by mouth daily.     NICOTROL 10 MG inhaler INHALE 1 CARTRIDGE INTO THE LUNGS AS NEEDED FOR SMOKING CESSATION. 168 each 0   NIFEdipine (PROCARDIA XL/NIFEDICAL-XL) 90 MG 24 hr tablet Take 1 tablet (90 mg total) by mouth every evening. 90 tablet 1   nitroGLYCERIN (NITROSTAT) 0.4 MG SL tablet Place 1 tablet (0.4 mg total) under the tongue every 5 (five) minutes as needed. Do not take within 2 days of viagra. 25 tablet 3   pantoprazole (PROTONIX) 40 MG tablet TAKE ONE TABLET BY MOUTH TWICE DAILY. 60 tablet 0   sildenafil (VIAGRA) 100 MG tablet Take 1 tablet (100 mg total) by mouth as needed for erectile dysfunction. 15 tablet prn   tamsulosin (FLOMAX) 0.4 MG CAPS capsule Take 0.4 mg by mouth as needed.     ezetimibe (ZETIA) 10 MG tablet TAKE ONE TABLET BY MOUTH ONCE DAILY. (Patient not taking: Reported on 02/07/2023) 30 tablet 0   No current facility-administered medications for this visit.   Allergies:  Other and Keflex [cephalexin]   Social History: The patient  reports that he quit smoking about 20 years ago. His smoking use included cigarettes. He started smoking about 52 years ago. He has a 48.00 pack-year smoking history. He has never used smokeless tobacco. He  reports current alcohol use. He reports that he does not use drugs.   Family History: The patient's family history includes Bladder Cancer in his maternal grandfather; Cancer in an other family member; Heart disease in his father, mother, and another family member; Kidney cancer in his mother; Kidney disease in an other family member; Lung cancer in his mother; Prostate cancer in his father and maternal uncle.   ROS:  Please see the history of present illness. Otherwise, complete review of systems is positive for none.  All other systems are reviewed and negative.   Physical Exam: VS:  BP Marland Kitchen)  148/70   Pulse 64   Ht 6' (1.829 m)   Wt 217 lb 12.8 oz (98.8 kg)   SpO2 96%   BMI 29.54 kg/m , BMI Body mass index is 29.54 kg/m.  Wt Readings from Last 3 Encounters:  02/07/23 217 lb 12.8 oz (98.8 kg)  01/18/23 215 lb 9.6 oz (97.8 kg)  11/19/22 215 lb 9.6 oz (97.8 kg)    General: Patient appears comfortable at rest. HEENT: Conjunctiva and lids normal, oropharynx clear with moist mucosa. Neck: Supple, no elevated JVP or carotid bruits, no thyromegaly. Lungs: Clear to auscultation, nonlabored breathing at rest. Cardiac: Regular rate and rhythm, no S3 or significant systolic murmur, no pericardial rub. Abdomen: Soft, nontender, no hepatomegaly, bowel sounds present, no guarding or rebound. Extremities: No pitting edema, distal pulses 2+. Skin: Warm and dry. Musculoskeletal: No kyphosis. Neuropsychiatric: Alert and oriented x3, affect grossly appropriate.  ECG:  NSR  Recent Labwork: 02/19/2022: ALT 22; AST 22 09/30/2022: BUN 13; Creatinine, Ser 1.67; Hemoglobin 14.7; Magnesium 1.7; Platelets 150; Potassium 4.1; Sodium 137     Component Value Date/Time   CHOL 182 09/30/2022 0256   TRIG 123 09/30/2022 0256   HDL 40 (L) 09/30/2022 0256   CHOLHDL 4.6 09/30/2022 0256   VLDL 25 09/30/2022 0256   LDLCALC 117 (H) 09/30/2022 0256   LDLCALC 68 03/27/2021 1237    Other Studies Reviewed  Today: Echocardiogram in 09/2022 LVEF normal No RWMA No valve abnormalities  LHC in 09/2022 RCA and LCx stents are patent Nonobstructive CAD diffusely otherwise  Assessment and Plan: Patient is a 62 y/o M known to have CAD s/p RCA and LCX PCI in 2009 with normal LVEF, HTN, HLD, COPD, prostrate cancer s/p seed implantation and brachytherapy, Stage III CKD (prior right nephrectomy in 2003) presented to the cardiology clinic for follow-up.  # HTN, poorly controlled -I personally reviewed the log of blood pressure readings which the patient brought to the clinic.  His blood pressures were partially controlled and sometimes has elevated blood pressure readings between 150-170 mm Hg in AM and PM, but mostly in the p.m. His blood pressures are better controlled compared to prior readings of 200s. -Discontinue amlodipine and start nifedipine 90 mg once daily at bedtime -Increase carvedilol from 6.25 mg to 12.5 mg twice daily -Continue lisinopril 20 mg once daily -Continue hydralazine 25 mg three times daily -Not a candidate for nitrates due to sildenafil use -Blood pressure check in the a.m. and p.m. and keep a log  # CAD s/p RCA and LCX PCI with normal LVEF, currently angina free -Continue aspirin 81 mg once daily -Continue atorvastatin 80 mg nightly and Zetia 10 mg once daily (he will get refill of Zetia from his PCP) -Increase carvedilol from 6.25 mg to 12.5 mg twice daily and continue lisinopril 20 mg once daily (follows up with nephrology and recommended lisinopril due to proteinuria.  He was earlier on lisinopril 40 mg resulting in hyperkalemia and hence the dose had to be decreased). -ER precautions for chest pain  # HLD, currently not at goal -Continue atorvastatin 80 mg nightly and Zetia 10 mg once daily.  Goal LDL less than 70.  He has PCP appointment on Thursday of this week and wants to get all his medications refilled by his PCP.  He also wants only 1 physician to refill all the  medications and not by multiple physicians.  # Stage III CKD (status post prior R nephrectomy in 2003): Follow-up with PCP/nephrology  I have  spent a total of 33 minutes with patient reviewing chart, EKGs, labs and examining patient as well as establishing an assessment and plan that was discussed with the patient.  > 50% of time was spent in direct patient care.      Medication Adjustments/Labs and Tests Ordered: Current medicines are reviewed at length with the patient today.  Concerns regarding medicines are outlined above.   Tests Ordered: No orders of the defined types were placed in this encounter.   Medication Changes: Meds ordered this encounter  Medications   carvedilol (COREG) 12.5 MG tablet    Sig: Take 1 tablet (12.5 mg total) by mouth 2 (two) times daily.    Dispense:  180 tablet    Refill:  1    02/07/2023 dose increase   NIFEdipine (PROCARDIA XL/NIFEDICAL-XL) 90 MG 24 hr tablet    Sig: Take 1 tablet (90 mg total) by mouth every evening.    Dispense:  90 tablet    Refill:  1    02/07/2023 NEW-stop amlodipine    Disposition:  Follow up 3 months  Signed, Mckensi Redinger Verne Spurr, MD, 02/07/2023 12:12 PM    [facility] [address], [place], [place] [address]

## 2023-02-07 NOTE — Patient Instructions (Addendum)
Medication Instructions:  Your physician has recommended you make the following change in your medication:  Stop amlodipine Start nifedipine 90 mg daily Increase carvedilol to 12.5 mg twice daily Continue other medications the same  Labwork: none  Testing/Procedures: none  Follow-Up: Your physician recommends that you schedule a follow-up appointment in: 3 months  Any Other Special Instructions Will Be Listed Below (If Applicable).  If you need a refill on your cardiac medications before your next appointment, please call your pharmacy.

## 2023-02-10 ENCOUNTER — Encounter: Payer: Self-pay | Admitting: Family Medicine

## 2023-02-10 ENCOUNTER — Other Ambulatory Visit: Payer: Self-pay

## 2023-02-10 ENCOUNTER — Ambulatory Visit (INDEPENDENT_AMBULATORY_CARE_PROVIDER_SITE_OTHER): Payer: PPO | Admitting: Family Medicine

## 2023-02-10 VITALS — BP 126/62 | HR 67 | Temp 98.6°F | Ht 72.0 in | Wt 217.6 lb

## 2023-02-10 DIAGNOSIS — I2510 Atherosclerotic heart disease of native coronary artery without angina pectoris: Secondary | ICD-10-CM

## 2023-02-10 DIAGNOSIS — M1000 Idiopathic gout, unspecified site: Secondary | ICD-10-CM

## 2023-02-10 DIAGNOSIS — Z8546 Personal history of malignant neoplasm of prostate: Secondary | ICD-10-CM | POA: Diagnosis not present

## 2023-02-10 DIAGNOSIS — E7800 Pure hypercholesterolemia, unspecified: Secondary | ICD-10-CM

## 2023-02-10 DIAGNOSIS — I10 Essential (primary) hypertension: Secondary | ICD-10-CM

## 2023-02-10 DIAGNOSIS — Z9861 Coronary angioplasty status: Secondary | ICD-10-CM | POA: Diagnosis not present

## 2023-02-10 DIAGNOSIS — N1832 Chronic kidney disease, stage 3b: Secondary | ICD-10-CM

## 2023-02-10 DIAGNOSIS — F411 Generalized anxiety disorder: Secondary | ICD-10-CM | POA: Diagnosis not present

## 2023-02-10 LAB — CBC WITH DIFFERENTIAL/PLATELET
Absolute Monocytes: 418 cells/uL (ref 200–950)
Basophils Absolute: 61 cells/uL (ref 0–200)
Basophils Relative: 0.8 %
Hemoglobin: 14.1 g/dL (ref 13.2–17.1)
MCHC: 34.2 g/dL (ref 32.0–36.0)
Monocytes Relative: 5.5 %
Total Lymphocyte: 17.7 %

## 2023-02-10 MED ORDER — HYDROCODONE-ACETAMINOPHEN 5-325 MG PO TABS: 1.0000 | ORAL_TABLET | Freq: Four times a day (QID) | ORAL | 0 refills | Status: DC | PRN

## 2023-02-10 MED ORDER — EZETIMIBE 10 MG PO TABS
10.0000 mg | ORAL_TABLET | Freq: Every day | ORAL | 1 refills | Status: DC
Start: 2023-02-10 — End: 2023-10-10

## 2023-02-10 MED ORDER — ALLOPURINOL 100 MG PO TABS
200.0000 mg | ORAL_TABLET | Freq: Every day | ORAL | 1 refills | Status: AC
Start: 2023-02-10 — End: ?

## 2023-02-10 MED ORDER — ALPRAZOLAM 0.5 MG PO TABS
0.5000 mg | ORAL_TABLET | Freq: Three times a day (TID) | ORAL | 0 refills | Status: DC | PRN
Start: 2023-02-10 — End: 2023-03-10

## 2023-02-10 NOTE — Progress Notes (Signed)
Subjective:    Patient ID: Brandon Buck, male    DOB: 26-Feb-1961, 62 y.o.   MRN: [id number]  Patient has a history of prostate cancer s/p radioactive seed placement.  He also has a history of HTN, HLD, and CAD s/p PTCA.  Colonoscopy was in 2022 and is due again in 2027.  Patient recently has been dealing with hypertensive urgency.  His cardiologist added nifedipine, hydralazine, increased his carvedilol.  Over the last week, his blood pressure is finally trended under 140 systolic and under 70 diastolic.  He was having chest pain but this has improved.  His PSA was rechecked by urologist was having 0 to.  Pressure today is 26 or 16..  Otherwise he is doing well Past Medical History:  Diagnosis Date   Anxiety    Arthritis    Asthma    Cancer    right renal cancer, right radical nephrectomy 2004   Chronic back pain    Chronic hip pain, left    CKD (chronic kidney disease) stage 3, GFR 30-59 ml/min    ckd stage 3 b    Complication of anesthesia    1 seizure after anesthesia 1999 or 2000 none since , recent surgeries no seizures   Coronary artery disease    a. s/p prior PCI to RCA and LCx in 2009   Depression    GERD (gastroesophageal reflux disease)    Gout    no recent flares   Heart disease    High blood pressure    History of PTCA 2 2004   2 stents   History of stomach ulcers    Hypercholesteremia    Hypertension    Kidney disease    Myocardial infarction 2004   Prostate cancer    3/12 cores upt to 40% grade 2 cancer. Planning brachytherapy (2021)   Proteinuria    Renal insufficiency    Tennis elbow    Past Surgical History:  Procedure Laterality Date   COLONOSCOPY WITH PROPOFOL N/A 10/28/2020   Procedure: COLONOSCOPY WITH PROPOFOL;  Surgeon: Dolores Frame, MD;  [facility];  Service: Gastroenterology;  Laterality: N/A;  2:30   coloscopy  10/28/2020   removed 7 polyps done at [place]   CYSTOSCOPY N/A 11/03/2020   Procedure: CYSTOSCOPY;   Surgeon: Marcine Matar, MD;  Location: [facility];  Service: Urology;  Laterality: N/A;   Heart catherization  2004   2 stents to heart cypher left circulflex   HERNIA REPAIR  1999   left groin bilateral   HIP SURGERY Left 1999 or 2000   bond graft   Kidney removed Right 2003 or 2004   for cancer   LEFT HEART CATH AND CORONARY ANGIOGRAPHY N/A 09/29/2022   Procedure: LEFT HEART CATH AND CORONARY ANGIOGRAPHY;  Surgeon: Corky Crafts, MD;  Location: [facility] CV [facility];  Service: Cardiovascular;  Laterality: N/A;   POLYPECTOMY  10/28/2020   Procedure: POLYPECTOMY INTESTINAL;  Surgeon: Dolores Frame, MD;  [facility];  Service: Gastroenterology;;   PROSTATE BIOPSY  2021   RADIOACTIVE SEED IMPLANT N/A 11/03/2020   Procedure: RADIOACTIVE SEED IMPLANT/BRACHYTHERAPY IMPLANT;  Surgeon: Marcine Matar, MD;  Location: [facility];  Service: Urology;  Laterality: N/A;  90 MINS   SPACE OAR INSTILLATION N/A 11/03/2020   Procedure: SPACE OAR INSTILLATION;  Surgeon: Marcine Matar, MD;  Location: [facility];  Service: Urology;  Laterality: N/A;   Current Outpatient Medications on File  Prior to Visit  Medication Sig Dispense Refill   acetaminophen (TYLENOL) 500 MG tablet Take 500 mg by mouth every 6 (six) hours as needed.     allopurinol (ZYLOPRIM) 100 MG tablet TAKE 2 TABLETS BY MOUTH DAILY. 60 tablet 0   ALPRAZolam (XANAX) 0.5 MG tablet TAKE (1) TABLET BY MOUTH TWICE DAILY AS NEEDED FOR ANXIETY 30 tablet 0   aspirin 81 MG tablet Take 81 mg by mouth daily.     atorvastatin (LIPITOR) 80 MG tablet TAKE (1) TABLET BY MOUTH ONCE DAILY. 90 tablet 0   carvedilol (COREG) 12.5 MG tablet Take 1 tablet (12.5 mg total) by mouth 2 (two) times daily. 180 tablet 1   ezetimibe (ZETIA) 10 MG tablet TAKE ONE TABLET BY MOUTH ONCE DAILY. (Patient not taking: Reported on 02/07/2023) 30 tablet 0   furosemide (LASIX) 20 MG tablet Take 20 mg by  mouth daily as needed for edema or fluid.     hydrALAZINE (APRESOLINE) 25 MG tablet Take 1 tablet (25 mg total) by mouth 3 (three) times daily. 270 tablet 3   HYDROcodone-acetaminophen (NORCO/VICODIN) 5-325 MG tablet TAKE 1 TABLET BY MOUTH (3) TIMES DAILY AS NEEDED FOR MODERATE PAIN. 90 tablet 0   lisinopril (ZESTRIL) 20 MG tablet Take 20 mg by mouth daily.     NICOTROL 10 MG inhaler INHALE 1 CARTRIDGE INTO THE LUNGS AS NEEDED FOR SMOKING CESSATION. 168 each 0   NIFEdipine (PROCARDIA XL/NIFEDICAL-XL) 90 MG 24 hr tablet Take 1 tablet (90 mg total) by mouth every evening. 90 tablet 1   nitroGLYCERIN (NITROSTAT) 0.4 MG SL tablet Place 1 tablet (0.4 mg total) under the tongue every 5 (five) minutes as needed. Do not take within 2 days of viagra. 25 tablet 3   pantoprazole (PROTONIX) 40 MG tablet TAKE ONE TABLET BY MOUTH TWICE DAILY. 60 tablet 0   sildenafil (VIAGRA) 100 MG tablet Take 1 tablet (100 mg total) by mouth as needed for erectile dysfunction. 15 tablet prn   tamsulosin (FLOMAX) 0.4 MG CAPS capsule Take 0.4 mg by mouth as needed.     No current facility-administered medications on file prior to visit.    Allergies  Allergen Reactions   Other     Pt. Reports having seizures after anesthesia once 1999 or 2000 none since   Keflex [Cephalexin] Rash   Social History   Socioeconomic History   Marital status: Married    Spouse name: Sheree   Number of children: 0   Years of education: Not on file   Highest education level: Not on file  Occupational History   Occupation: retired  Tobacco Use   Smoking status: Former    Packs/day: 1.50    Years: 32.00    Additional pack years: 0.00    Total pack years: 48.00    Types: Cigarettes    Start date: 01/26/1971    Quit date: 01/26/2003    Years since quitting: 20.0   Smokeless tobacco: Never  Vaping Use   Vaping Use: Never used  Substance and Sexual Activity   Alcohol use: Yes    Alcohol/week: 0.0 standard drinks of alcohol    Comment:  occ 2 week   Drug use: No   Sexual activity: Yes  Other Topics Concern   Not on file  Social History Narrative   Worked as an Personnel officer.    Married x 45 years in 2022.   Social Determinants of Health   Financial Resource Strain: Low Risk  (10/04/2022)   Overall  Financial Resource Strain (CARDIA)    Difficulty of Paying Living Expenses: Not hard at all  Food Insecurity: No Food Insecurity (09/29/2022)   Hunger Vital Sign    Worried About Running Out of Food in the Last Year: Never true    Ran Out of Food in the Last Year: Never true  Transportation Needs: No Transportation Needs (10/04/2022)   PRAPARE - Administrator, Civil Service (Medical): No    Lack of Transportation (Non-Medical): No  Physical Activity: Sufficiently Active (07/24/2021)   Exercise Vital Sign    Days of Exercise per Week: 5 days    Minutes of Exercise per Session: 30 min  Stress: No Stress Concern Present (07/24/2021)   [employer] of Occupational Health - Occupational Stress Questionnaire    Feeling of Stress : Not at all  Social Connections: Socially Integrated (07/24/2021)   Social Connection and Isolation Panel [NHANES]    Frequency of Communication with Friends and Family: More than three times a week    Frequency of Social Gatherings with Friends and Family: Three times a week    Attends Religious Services: 1 to 4 times per year    Active Member of Clubs or Organizations: No    Attends Banker Meetings: 1 to 4 times per year    Marital Status: Married  Catering manager Violence: Not At Risk (09/29/2022)   Humiliation, Afraid, Rape, and Kick questionnaire    Fear of Current or Ex-Partner: No    Emotionally Abused: No    Physically Abused: No    Sexually Abused: No      Review of Systems  All other systems reviewed and are negative.      Objective:   Physical Exam Vitals reviewed.  Constitutional:      General: He is not in acute distress.    Appearance: He is  well-developed. He is not diaphoretic.  HENT:     Right Ear: External ear normal.     Left Ear: External ear normal.     Nose: Nose normal.     Mouth/Throat:     Pharynx: No oropharyngeal exudate.  Eyes:     General: No scleral icterus.    Conjunctiva/sclera: Conjunctivae normal.  Cardiovascular:     Rate and Rhythm: Normal rate and regular rhythm.     Heart sounds: Normal heart sounds.  Pulmonary:     Effort: Pulmonary effort is normal. No respiratory distress.     Breath sounds: Normal breath sounds. No wheezing or rales.  Abdominal:     General: Bowel sounds are normal. There is no distension.     Palpations: Abdomen is soft.     Tenderness: There is no abdominal tenderness. There is no rebound.     Hernia: There is no hernia in the left inguinal area.  Genitourinary:    Penis: Normal.      Testes: Normal.        Right: Mass or tenderness not present.        Left: Mass or tenderness not present.  Musculoskeletal:     Cervical back: Neck supple.  Lymphadenopathy:     Cervical: No cervical adenopathy.           Assessment & Plan:  ASCVD (arteriosclerotic cardiovascular disease) - Plan: CBC with Differential/Platelet, COMPLETE METABOLIC PANEL WITH GFR, Lipid panel  Stage 3b chronic kidney disease  History of PTCA 2 - Plan: CBC with Differential/Platelet, COMPLETE METABOLIC PANEL WITH GFR, Lipid panel  Pure hypercholesterolemia -  Plan: CBC with Differential/Platelet, COMPLETE METABOLIC PANEL WITH GFR, Lipid panel  Essential hypertension - Plan: CBC with Differential/Platelet, COMPLETE METABOLIC PANEL WITH GFR, Lipid panel  History of prostate cancer - Plan: CANCELED: PSA Blood pressure today is outstanding.  I counseled the patient that only keep him in the 130s over 80s because of his chronic kidney disease.  Monitor his creatinine today.  Check CBC CMP and lipid panel.  Patient recently had PSA with his urologist so I will cancel this test.  His goal LDL cholesterol  is less than 55.

## 2023-02-11 LAB — COMPLETE METABOLIC PANEL WITH GFR
AG Ratio: 1.8 (calc) (ref 1.0–2.5)
ALT: 15 U/L (ref 9–46)
AST: 15 U/L (ref 10–35)
Albumin: 4.4 g/dL (ref 3.6–5.1)
Alkaline phosphatase (APISO): 73 U/L (ref 35–144)
BUN/Creatinine Ratio: 12 (calc) (ref 6–22)
BUN: 21 mg/dL (ref 7–25)
CO2: 27 mmol/L (ref 20–32)
Calcium: 9.7 mg/dL (ref 8.6–10.3)
Chloride: 106 mmol/L (ref 98–110)
Creat: 1.82 mg/dL — ABNORMAL HIGH (ref 0.70–1.35)
Globulin: 2.5 g/dL (calc) (ref 1.9–3.7)
Glucose, Bld: 101 mg/dL — ABNORMAL HIGH (ref 65–99)
Potassium: 4.4 mmol/L (ref 3.5–5.3)
Sodium: 140 mmol/L (ref 135–146)
Total Bilirubin: 0.7 mg/dL (ref 0.2–1.2)
Total Protein: 6.9 g/dL (ref 6.1–8.1)
eGFR: 41 mL/min/{1.73_m2} — ABNORMAL LOW (ref >=60)

## 2023-02-11 LAB — CBC WITH DIFFERENTIAL/PLATELET
Eosinophils Absolute: 68 cells/uL (ref 15–500)
Eosinophils Relative: 0.9 %
HCT: 41.2 % (ref 38.5–50.0)
Lymphs Abs: 1345 cells/uL (ref 850–3900)
MCH: 32.3 pg (ref 27.0–33.0)
MCV: 94.3 fL (ref 80.0–100.0)
MPV: 10.2 fL (ref 7.5–12.5)
Neutro Abs: 5708 cells/uL (ref 1500–7800)
Neutrophils Relative %: 75.1 %
Platelets: 198 10*3/uL (ref 140–400)
RBC: 4.37 10*6/uL (ref 4.20–5.80)
RDW: 13.3 % (ref 11.0–15.0)
WBC: 7.6 10*3/uL (ref 3.8–10.8)

## 2023-02-11 LAB — LIPID PANEL
Cholesterol: 164 mg/dL (ref <200)
HDL: 54 mg/dL (ref >=40)
LDL Cholesterol (Calc): 83 mg/dL (calc)
Non-HDL Cholesterol (Calc): 110 mg/dL (calc) (ref <130)
Total CHOL/HDL Ratio: 3 (calc) (ref <5.0)
Triglycerides: 174 mg/dL — ABNORMAL HIGH (ref <150)

## 2023-02-25 ENCOUNTER — Telehealth: Payer: Self-pay | Admitting: Internal Medicine

## 2023-02-25 MED ORDER — NIFEDIPINE ER OSMOTIC RELEASE 90 MG PO TB24: 90.0000 mg | ORAL_TABLET | Freq: Every day | ORAL | Status: AC

## 2023-02-25 MED ORDER — CARVEDILOL 12.5 MG PO TABS: 12.5000 mg | ORAL_TABLET | Freq: Two times a day (BID) | ORAL | Status: AC

## 2023-02-25 MED ORDER — HYDRALAZINE HCL 25 MG PO TABS: 25.0000 mg | ORAL_TABLET | Freq: Three times a day (TID) | ORAL | 3 refills | Status: DC

## 2023-02-25 NOTE — Telephone Encounter (Signed)
Spoke with patient - states he is taking all of his morning medications between 6 - 8:30 am.  Takes the second dose of Hydralazine at noon.

## 2023-02-25 NOTE — Telephone Encounter (Signed)
Pt c/o BP issue: STAT if pt c/o blurred vision, one-sided weakness or slurred speech  1. What are your last 5 BP readings? 119/49 pulse 62, took his medicine and it is 101/44  pulse 60  2. Are you having any other symptoms (ex. Dizziness, headache, blurred vision, passed out)? Dizzy and feel like he is going to pass out, also feel a little nauseated  3. What is your BP issue? wonder if his blood pressure is too low and making him feeel this way

## 2023-02-25 NOTE — Telephone Encounter (Signed)
Patient notified - states he is already taking the Nefidipine 90mg  at bedtime.  Suggested he try moving the Lisinopril to dinner time as his readings are a lot higher in the evenings.  Asked him to keep BP log for the next 7-10 days & return readings to office for provider review.

## 2023-02-25 NOTE — Telephone Encounter (Signed)
Spoke with patient - states this morning is the first time BP has been this low.    6:30 am - 119/49   8:00 am - 101/44  Yesterday BP was 176/96  70 & after meds 130/58  62  BP check while on phone - 142/57  69

## 2023-03-01 NOTE — Progress Notes (Signed)
Open in Error:  This appt was canceled by patient.

## 2023-03-09 ENCOUNTER — Other Ambulatory Visit: Payer: Self-pay | Admitting: Family Medicine

## 2023-03-09 DIAGNOSIS — F411 Generalized anxiety disorder: Secondary | ICD-10-CM

## 2023-03-23 ENCOUNTER — Other Ambulatory Visit: Payer: Self-pay | Admitting: Family Medicine

## 2023-04-07 ENCOUNTER — Other Ambulatory Visit: Payer: Self-pay | Admitting: Family Medicine

## 2023-04-07 DIAGNOSIS — F411 Generalized anxiety disorder: Secondary | ICD-10-CM

## 2023-04-07 NOTE — Telephone Encounter (Signed)
Requested medication (s) are due for refill today - yes  Requested medication (s) are on the active medication list -yes  Future visit scheduled -no  Last refill: alprazolam 03/10/23 #30                  Hydrocodone 03/10/23 #60  Notes to clinic: non delegated Rx  Requested Prescriptions  Pending Prescriptions Disp Refills   ALPRAZolam (XANAX) 0.5 MG tablet [Pharmacy Med Name: ALPRAZOLAM 0.5 MG TABLET] 30 tablet 0    Sig: TAKE 1 TABLET BY MOUTH THREE TIMES DAILYAS NEEDED FOR ANXIETY     Not Delegated - Psychiatry: Anxiolytics/Hypnotics 2 Failed - 04/07/2023 10:22 AM      Failed - This refill cannot be delegated      Failed - Urine Drug Screen completed in last 360 days      Failed - Valid encounter within last 6 months    Recent Outpatient Visits           2 years ago ASCVD (arteriosclerotic cardiovascular disease)   [facility] Donita Brooks, MD   2 years ago Pure hypercholesterolemia   [facility] Tanya Nones, Priscille Heidelberg, MD   3 years ago Benign essential HTN   [facility] Donita Brooks, MD   4 years ago Acute idiopathic gout, unspecified site   [facility] Pickard, Priscille Heidelberg, MD   4 years ago Acute bacterial rhinosinusitis   [facility] Pickard, Priscille Heidelberg, MD       Future Appointments             In 1 month Mallipeddi, Orion Modest, MD [facility] at [place], [place]   In 3 months Marcine Matar, MD [facility] - Patient is not pregnant       HYDROcodone-acetaminophen (NORCO/VICODIN) 5-325 MG tablet [Pharmacy Med Name: Ardell Isaacs MG] 90 tablet 0    Sig: TAKE 1 TABLET BY MOUTH EVERY 6 HOURS AS NEEDED FOR MODERATE PAIN     Not Delegated - Analgesics:  Opioid Agonist Combinations Failed - 04/07/2023 10:22 AM      Failed - This refill cannot be delegated      Failed - Urine Drug Screen completed in last 360 days       Failed - Valid encounter within last 3 months    Recent Outpatient Visits           2 years ago ASCVD (arteriosclerotic cardiovascular disease)   [facility] Donita Brooks, MD   2 years ago Pure hypercholesterolemia   [facility] Tanya Nones, Priscille Heidelberg, MD   3 years ago Benign essential HTN   [facility] Donita Brooks, MD   4 years ago Acute idiopathic gout, unspecified site   [facility] Pickard, Priscille Heidelberg, MD   4 years ago Acute bacterial rhinosinusitis   [facility] Pickard, Priscille Heidelberg, MD       Future Appointments             In 1 month Mallipeddi, Orion Modest, MD [facility] at [place], [place]   In 3 months Marcine Matar, MD [facility] [place]               Requested Prescriptions  Pending Prescriptions Disp Refills   ALPRAZolam Prudy Feeler) 0.5 MG tablet [Pharmacy Med Name: ALPRAZOLAM  0.5 MG TABLET] 30 tablet 0    Sig: TAKE 1 TABLET BY MOUTH THREE TIMES DAILYAS NEEDED FOR ANXIETY     Not Delegated - Psychiatry: Anxiolytics/Hypnotics 2 Failed - 04/07/2023 10:22 AM      Failed - This refill cannot be delegated      Failed - Urine Drug Screen completed in last 360 days      Failed - Valid encounter within last 6 months    Recent Outpatient Visits           2 years ago ASCVD (arteriosclerotic cardiovascular disease)   [facility] Donita Brooks, MD   2 years ago Pure hypercholesterolemia   [facility] Tanya Nones, Priscille Heidelberg, MD   3 years ago Benign essential HTN   [facility] Donita Brooks, MD   4 years ago Acute idiopathic gout, unspecified site   [facility] Pickard, Priscille Heidelberg, MD   4 years ago Acute bacterial rhinosinusitis   [facility] Pickard, Priscille Heidelberg, MD       Future Appointments             In 1 month Mallipeddi, Orion Modest, MD [facility] at  [place], [place]   In 3 months Marcine Matar, MD [facility] - Patient is not pregnant       HYDROcodone-acetaminophen (NORCO/VICODIN) 5-325 MG tablet [Pharmacy Med Name: Ardell Isaacs MG] 90 tablet 0    Sig: TAKE 1 TABLET BY MOUTH EVERY 6 HOURS AS NEEDED FOR MODERATE PAIN     Not Delegated - Analgesics:  Opioid Agonist Combinations Failed - 04/07/2023 10:22 AM      Failed - This refill cannot be delegated      Failed - Urine Drug Screen completed in last 360 days      Failed - Valid encounter within last 3 months    Recent Outpatient Visits           2 years ago ASCVD (arteriosclerotic cardiovascular disease)   [facility] Donita Brooks, MD   2 years ago Pure hypercholesterolemia   [facility] Tanya Nones, Priscille Heidelberg, MD   3 years ago Benign essential HTN   [facility] Donita Brooks, MD   4 years ago Acute idiopathic gout, unspecified site   [facility] Pickard, Priscille Heidelberg, MD   4 years ago Acute bacterial rhinosinusitis   [facility] Pickard, Priscille Heidelberg, MD       Future Appointments             In 1 month Mallipeddi, Orion Modest, MD [facility] at [place], [place]   In 3 months Marcine Matar, MD [facility] [place]

## 2023-05-09 ENCOUNTER — Other Ambulatory Visit: Payer: Self-pay | Admitting: Family Medicine

## 2023-05-09 DIAGNOSIS — F411 Generalized anxiety disorder: Secondary | ICD-10-CM

## 2023-05-10 NOTE — Telephone Encounter (Signed)
Requested medication (s) are due for refill today: Yes  Requested medication (s) are on the active medication list: Yes  Last refill:  04/11/23  Future visit scheduled: Yes  Notes to clinic:  Unable to refill per protocol, cannot delegate.      Requested Prescriptions  Pending Prescriptions Disp Refills   ALPRAZolam (XANAX) 0.5 MG tablet [Pharmacy Med Name: ALPRAZOLAM 0.5 MG TABLET] 30 tablet 0    Sig: TAKE 1 TABLET BY MOUTH THREE TIMES DAILYAS NEEDED FOR ANXIETY     Not Delegated - Psychiatry: Anxiolytics/Hypnotics 2 Failed - 05/09/2023  3:39 PM      Failed - This refill cannot be delegated      Failed - Urine Drug Screen completed in last 360 days      Failed - Valid encounter within last 6 months    Recent Outpatient Visits           2 years ago ASCVD (arteriosclerotic cardiovascular disease)   [facility] Donita Brooks, MD   2 years ago Pure hypercholesterolemia   [facility] Tanya Nones, Priscille Heidelberg, MD   3 years ago Benign essential HTN   [facility] Donita Brooks, MD   4 years ago Acute idiopathic gout, unspecified site   [facility] Pickard, Priscille Heidelberg, MD   4 years ago Acute bacterial rhinosinusitis   [facility] Pickard, Priscille Heidelberg, MD       Future Appointments             In 1 week Mallipeddi, Orion Modest, MD [place] [facility] [place], [place]   In 2 months Marcine Matar, MD [facility] - Patient is not pregnant       HYDROcodone-acetaminophen (NORCO/VICODIN) 5-325 MG tablet [Pharmacy Med Name: Ardell Isaacs MG] 90 tablet 0    Sig: TAKE 1 TABLET BY MOUTH EVERY 6 HOURS AS NEEDED FOR MODERATE PAIN     Not Delegated - Analgesics:  Opioid Agonist Combinations Failed - 05/09/2023  3:39 PM      Failed - This refill cannot be delegated      Failed - Urine Drug Screen completed in last 360 days      Failed - Valid  encounter within last 3 months    Recent Outpatient Visits           2 years ago ASCVD (arteriosclerotic cardiovascular disease)   [facility] Donita Brooks, MD   2 years ago Pure hypercholesterolemia   [facility] Tanya Nones, Priscille Heidelberg, MD   3 years ago Benign essential HTN   [facility] Donita Brooks, MD   4 years ago Acute idiopathic gout, unspecified site   [facility] Pickard, Priscille Heidelberg, MD   4 years ago Acute bacterial rhinosinusitis   [facility] Pickard, Priscille Heidelberg, MD       Future Appointments             In 1 week Mallipeddi, Orion Modest, MD [facility] at [place], [place]   In 2 months Marcine Matar, MD [facility] [place]            bs

## 2023-05-13 NOTE — Telephone Encounter (Signed)
Patient called to follow up on refill reqeusts for  the following meds:  HYDROcodone-acetaminophen (NORCO/VICODIN) 5-325 MG tablet   ALPRAZolam (XANAX) 0.5 MG tablet [[id number]]  **Patient only has 2 pills left** **Please see previous message in thread**  Pharmacy confirmed as:  [facility] - [place], [place] - [address], [place] [address] Phone: [phone]  Fax: [phone]   Please advise at [phone], or [phone]

## 2023-05-23 ENCOUNTER — Ambulatory Visit: Payer: PPO | Admitting: Internal Medicine

## 2023-06-09 ENCOUNTER — Other Ambulatory Visit: Payer: Self-pay | Admitting: Family Medicine

## 2023-06-10 NOTE — Telephone Encounter (Signed)
Requested medication (s) are due for refill today: Yes  Requested medication (s) are on the active medication list: Yes  Last refill:  05/13/23 #90, 0RF  Future visit scheduled: No  Notes to clinic:  Unable to refill per protocol, cannot delegate.      Requested Prescriptions  Pending Prescriptions Disp Refills   HYDROcodone-acetaminophen (NORCO/VICODIN) 5-325 MG tablet [Pharmacy Med Name: HYDROCODONE-ACETAMIN 69-325 MG] 90 tablet 0    Sig: TAKE 1 TABLET BY MOUTH EVERY 6 HOURS AS NEEDED FOR MODERATE PAIN     Not Delegated - Analgesics:  Opioid Agonist Combinations Failed - 06/09/2023  9:56 AM      Failed - This refill cannot be delegated      Failed - Urine Drug Screen completed in last 360 days      Failed - Valid encounter within last 3 months    Recent Outpatient Visits           2 years ago ASCVD (arteriosclerotic cardiovascular disease)   [facility] Donita Brooks, MD   2 years ago Pure hypercholesterolemia   [facility] Tanya Nones, Priscille Heidelberg, MD   3 years ago Benign essential HTN   [facility] Donita Brooks, MD   4 years ago Acute idiopathic gout, unspecified site   [facility] Pickard, Priscille Heidelberg, MD   4 years ago Acute bacterial rhinosinusitis   [facility] Pickard, Priscille Heidelberg, MD       Future Appointments             In 1 month Marcine Matar, MD [facility] [place]            Signed Prescriptions Disp Refills   atorvastatin (LIPITOR) 80 MG tablet 90 tablet 0    Sig: TAKE (1) TABLET BY MOUTH ONCE DAILY.     Cardiovascular:  Antilipid - Statins Failed - 06/09/2023  9:56 AM      Failed - Valid encounter within last 12 months    Recent Outpatient Visits           2 years ago ASCVD (arteriosclerotic cardiovascular disease)   [facility] Pickard, Priscille Heidelberg, MD   2 years ago Pure hypercholesterolemia   [facility] Tanya Nones,  Priscille Heidelberg, MD   3 years ago Benign essential HTN   [facility] Donita Brooks, MD   4 years ago Acute idiopathic gout, unspecified site   [facility] Pickard, Priscille Heidelberg, MD   4 years ago Acute bacterial rhinosinusitis   [facility] Pickard, Priscille Heidelberg, MD       Future Appointments             In 1 month Marcine Matar, MD [facility]             Failed - Lipid Panel in normal range within the last 12 months    Cholesterol  Date Value Ref Range Status  02/10/2023 164 <200 mg/dL Final   LDL Cholesterol (Calc)  Date Value Ref Range Status  02/10/2023 83 mg/dL (calc) Final    Comment:    Reference range: <100 . Desirable range <100 mg/dL for primary prevention;   <70 mg/dL for patients with CHD or diabetic patients  with > or = 2 CHD risk factors. Marland Kitchen LDL-C is now calculated using the Martin-Hopkins  calculation, which is a validated novel method providing  better accuracy than the  Friedewald equation in the  estimation of LDL-C.  Horald Pollen et al. Lenox Ahr. 0272;536(64): 2061-2068  (http://education.[facility].com/faq/FAQ164)    HDL  Date Value Ref Range Status  02/10/2023 54 > OR = 40 mg/dL Final   Triglycerides  Date Value Ref Range Status  02/10/2023 174 (H) <150 mg/dL Final         Passed - Patient is not pregnant

## 2023-06-10 NOTE — Telephone Encounter (Signed)
Last OV 02/10/23 Requested Prescriptions  Pending Prescriptions Disp Refills   atorvastatin (LIPITOR) 80 MG tablet [Pharmacy Med Name: ATORVASTATIN 80 MG TABLET] 90 tablet 0    Sig: TAKE (1) TABLET BY MOUTH ONCE DAILY.     Cardiovascular:  Antilipid - Statins Failed - 06/09/2023  9:56 AM      Failed - Valid encounter within last 12 months    Recent Outpatient Visits           2 years ago ASCVD (arteriosclerotic cardiovascular disease)   [facility] Pickard, Priscille Heidelberg, MD   2 years ago Pure hypercholesterolemia   [facility] Tanya Nones, Priscille Heidelberg, MD   3 years ago Benign essential HTN   [facility] Donita Brooks, MD   4 years ago Acute idiopathic gout, unspecified site   [facility] Pickard, Priscille Heidelberg, MD   4 years ago Acute bacterial rhinosinusitis   [facility] Pickard, Priscille Heidelberg, MD       Future Appointments             In 1 month Marcine Matar, MD [facility] [place]            Failed - Lipid Panel in normal range within the last 12 months    Cholesterol  Date Value Ref Range Status  02/10/2023 164 <200 mg/dL Final   LDL Cholesterol (Calc)  Date Value Ref Range Status  02/10/2023 83 mg/dL (calc) Final    Comment:    Reference range: <100 . Desirable range <100 mg/dL for primary prevention;   <70 mg/dL for patients with CHD or diabetic patients  with > or = 2 CHD risk factors. Marland Kitchen LDL-C is now calculated using the Martin-Hopkins  calculation, which is a validated novel method providing  better accuracy than the Friedewald equation in the  estimation of LDL-C.  Horald Pollen et al. Lenox Ahr. 6789;381(01): 2061-2068  (http://education.[employer].com/faq/FAQ164)    HDL  Date Value Ref Range Status  02/10/2023 54 > OR = 40 mg/dL Final   Triglycerides  Date Value Ref Range Status  02/10/2023 174 (H) <150 mg/dL Final         Passed - Patient is not pregnant        HYDROcodone-acetaminophen (NORCO/VICODIN) 5-325 MG tablet [Pharmacy Med Name: HYDROCODONE-ACETAMIN 5-325 MG] 90 tablet 0    Sig: TAKE 1 TABLET BY MOUTH EVERY 6 HOURS AS NEEDED FOR MODERATE PAIN     Not Delegated - Analgesics:  Opioid Agonist Combinations Failed - 06/09/2023  9:56 AM      Failed - This refill cannot be delegated      Failed - Urine Drug Screen completed in last 360 days      Failed - Valid encounter within last 3 months    Recent Outpatient Visits           2 years ago ASCVD (arteriosclerotic cardiovascular disease)   [facility] Donita Brooks, MD   2 years ago Pure hypercholesterolemia   [facility] Tanya Nones, Priscille Heidelberg, MD   3 years ago Benign essential HTN   [facility] Donita Brooks, MD   4 years ago Acute idiopathic gout, unspecified site   [facility] Pickard, Priscille Heidelberg, MD   4 years ago Acute bacterial rhinosinusitis   [facility] Pickard, Priscille Heidelberg, MD       Future Appointments  In 1 month Marcine Matar, MD [facility] [place]

## 2023-06-13 ENCOUNTER — Other Ambulatory Visit: Payer: Self-pay | Admitting: Family Medicine

## 2023-06-13 DIAGNOSIS — F411 Generalized anxiety disorder: Secondary | ICD-10-CM

## 2023-06-14 NOTE — Telephone Encounter (Signed)
Requested medication (s) are due for refill today - yes  Requested medication (s) are on the active medication list -yes  Future visit scheduled -no  Last refill: 05/13/23 #30  Notes to clinic: non delegated Rx  Requested Prescriptions  Pending Prescriptions Disp Refills   ALPRAZolam (XANAX) 0.5 MG tablet [Pharmacy Med Name: ALPRAZOLAM 0.5 MG TABLET] 30 tablet 0    Sig: TAKE 1 TABLET BY MOUTH THREE TIMES DAILYAS NEEDED FOR ANXIETY     Not Delegated - Psychiatry: Anxiolytics/Hypnotics 2 Failed - 06/13/2023  2:27 PM      Failed - This refill cannot be delegated      Failed - Urine Drug Screen completed in last 360 days      Failed - Valid encounter within last 6 months    Recent Outpatient Visits           2 years ago ASCVD (arteriosclerotic cardiovascular disease)   [facility] Donita Brooks, MD   2 years ago Pure hypercholesterolemia   [facility] Tanya Nones, Priscille Heidelberg, MD   3 years ago Benign essential HTN   [facility] Donita Brooks, MD   4 years ago Acute idiopathic gout, unspecified site   [facility] Pickard, Priscille Heidelberg, MD   4 years ago Acute bacterial rhinosinusitis   [facility] Pickard, Priscille Heidelberg, MD       Future Appointments             In 1 month Marcine Matar, MD [facility] - Patient is not pregnant         Requested Prescriptions  Pending Prescriptions Disp Refills   ALPRAZolam (XANAX) 0.5 MG tablet [Pharmacy Med Name: ALPRAZOLAM 0.5 MG TABLET] 30 tablet 0    Sig: TAKE 1 TABLET BY MOUTH THREE TIMES DAILYAS NEEDED FOR ANXIETY     Not Delegated - Psychiatry: Anxiolytics/Hypnotics 2 Failed - 06/13/2023  2:27 PM      Failed - This refill cannot be delegated      Failed - Urine Drug Screen completed in last 360 days      Failed - Valid encounter within last 6 months    Recent Outpatient Visits           2 years ago  ASCVD (arteriosclerotic cardiovascular disease)   [facility] Donita Brooks, MD   2 years ago Pure hypercholesterolemia   [facility] Tanya Nones, Priscille Heidelberg, MD   3 years ago Benign essential HTN   [facility] Don[facility], MD   4 years ago Acute idiopathic gout, unspecified site   [facility] Pickard, Priscille Heidelberg, MD   4 years ago Acute bacterial rhinosinusitis   [facility] Pickard, Priscille Heidelberg, MD       Future Appointments             In 1 month Marcine Matar, MD [facility] - Patient is not pregnant

## 2023-06-17 ENCOUNTER — Other Ambulatory Visit: Payer: Self-pay | Admitting: Family Medicine

## 2023-06-17 ENCOUNTER — Telehealth: Payer: Self-pay | Admitting: Family Medicine

## 2023-06-17 DIAGNOSIS — F411 Generalized anxiety disorder: Secondary | ICD-10-CM

## 2023-06-17 MED ORDER — ALPRAZOLAM 0.5 MG PO TABS
0.5000 mg | ORAL_TABLET | Freq: Three times a day (TID) | ORAL | 0 refills | Status: DC | PRN
Start: 2023-06-17 — End: 2023-07-21

## 2023-06-17 NOTE — Telephone Encounter (Signed)
Prescription Request  06/17/2023  LOV: 02/10/2023  What is the name of the medication or equipment?   ALPRAZolam (XANAX) 0.5 MG tablet  **pharmacy told patient they haven't received response to refill request**  Have you contacted your pharmacy to request a refill? Yes   Which pharmacy would you like this sent to?  [facility] - [place], [place] - [address] [place] [address] Phone: [phone] Fax: [phone]    Patient notified that their request is being sent to the clinical staff for review and that they should receive a response within 2 business days.   Please advise patient.

## 2023-07-11 ENCOUNTER — Other Ambulatory Visit: Payer: PPO

## 2023-07-15 ENCOUNTER — Other Ambulatory Visit: Payer: Self-pay | Admitting: Family Medicine

## 2023-07-15 DIAGNOSIS — F411 Generalized anxiety disorder: Secondary | ICD-10-CM

## 2023-07-18 ENCOUNTER — Telehealth: Payer: Self-pay | Admitting: Urology

## 2023-07-18 NOTE — Telephone Encounter (Signed)
Patient called to follow up on refills requested for  ALPRAZolam Prudy Feeler) 0.5 MG tablet [[id number]]   HYDROcodone-acetaminophen (NORCO/VICODIN) 5-325 MG tablet [[id number]]   pantoprazole (PROTONIX) 40 MG tablet [[id number]]   ** LOV 02/10/23**  Pharmacy:   [facility] [place] - [address] [place], [place] [address] Phone: [phone]  Fax: [phone]   Please advise at [phone].

## 2023-07-18 NOTE — Telephone Encounter (Signed)
Left message with answering service to cancer appt , patient did not have his lab work done.

## 2023-07-18 NOTE — Telephone Encounter (Signed)
Requested medication (s) are due for refill today:   Provider to review  Requested medication (s) are on the active medication list:   Yes for all 3  Future visit scheduled:   Yes 10/20/2023 no name specified      LOV 02/10/2023   Last ordered: Xanax 06/17/2023 #30, 0 refills;   Hydrocodone 06/13/2023 #90, 0 refills;   Protonix 10/28/2022 #60, 0 refills  Returned because of the non delegated refills   Requested Prescriptions  Pending Prescriptions Disp Refills   pantoprazole (PROTONIX) 40 MG tablet [Pharmacy Med Name: pantoprazole 40 mg tablet,delayed release] 60 tablet 0    Sig: TAKE ONE TABLET BY MOUTH TWICE DAILY.     Gastroenterology: Proton Pump Inhibitors Failed - 07/15/2023 12:47 PM      Failed - Valid encounter within last 12 months    Recent Outpatient Visits           2 years ago ASCVD (arteriosclerotic cardiovascular disease)   [facility] Pickard, Priscille Heidelberg, MD   2 years ago Pure hypercholesterolemia   [facility] Tanya Nones, Priscille Heidelberg, MD   3 years ago Benign essential HTN   [facility] Donita Brooks, MD   4 years ago Acute idiopathic gout, unspecified site   [facility] Pickard, Priscille Heidelberg, MD   4 years ago Acute bacterial rhinosinusitis   [facility] Pickard, Priscille Heidelberg, MD               HYDROcodone-acetaminophen (NORCO/VICODIN) 5-325 MG tablet [Pharmacy Med Name: hydrocodone 5 mg-acetaminophen 325 mg tablet] 90 tablet 0    Sig: TAKE 1 TABLET BY MOUTH EVERY 6 HOURS AS NEEDED FOR MODERATE PAIN     Not Delegated - Analgesics:  Opioid Agonist Combinations Failed - 07/15/2023 12:47 PM      Failed - This refill cannot be delegated      Failed - Urine Drug Screen completed in last 360 days      Failed - Valid encounter within last 3 months    Recent Outpatient Visits           2 years ago ASCVD (arteriosclerotic cardiovascular disease)   [facility] Donita Brooks, MD   2 years ago Pure hypercholesterolemia   [facility] Tanya Nones, Priscille Heidelberg, MD   3 years ago Benign essential HTN   [facility] Donita Brooks, MD   4 years ago Acute idiopathic gout, unspecified site   [facility] Pickard, Priscille Heidelberg, MD   4 years ago Acute bacterial rhinosinusitis   [facility] Pickard, Priscille Heidelberg, MD               ALPRAZolam Prudy Feeler) 0.5 MG tablet [Pharmacy Med Name: alprazolam 0.5 mg tablet] 30 tablet 0    Sig: TAKE ONE TABLET BY MOUTH 3 TIMES A DAY AS NEEDED FOR ANXIETY     Not Delegated - Psychiatry: Anxiolytics/Hypnotics 2 Failed - 07/15/2023 12:47 PM      Failed - This refill cannot be delegated      Failed - Urine Drug Screen completed in last 360 days      Failed - Valid encounter within last 6 months    Recent Outpatient Visits           2 years ago ASCVD (arteriosclerotic cardiovascular disease)   [facility] Donita Brooks, MD   2 years ago Pure  hypercholesterolemia   [facility] Tanya Nones, Priscille Heidelberg, MD   3 years ago Benign essential HTN   [facility] Tanya Nones Priscille Heidelberg, MD   4 years ago Acute idiopathic gout, unspecified site   [facility] Pickard, Priscille Heidelberg, MD   4 years ago Acute bacterial rhinosinusitis   [facility] Pickard, Priscille Heidelberg, MD              Passed - Patient is not pregnant

## 2023-07-18 NOTE — Telephone Encounter (Signed)
Left message on voicemail for patient to call back and reschedule appointment ,  cancelled the appt for 07/19/23 per wife request.

## 2023-07-19 ENCOUNTER — Ambulatory Visit: Payer: PPO | Admitting: Urology

## 2023-07-19 DIAGNOSIS — Z8546 Personal history of malignant neoplasm of prostate: Secondary | ICD-10-CM

## 2023-07-19 DIAGNOSIS — N138 Other obstructive and reflux uropathy: Secondary | ICD-10-CM

## 2023-07-19 DIAGNOSIS — C61 Malignant neoplasm of prostate: Secondary | ICD-10-CM

## 2023-07-20 ENCOUNTER — Other Ambulatory Visit: Payer: Self-pay

## 2023-07-20 DIAGNOSIS — K219 Gastro-esophageal reflux disease without esophagitis: Secondary | ICD-10-CM

## 2023-07-20 MED ORDER — PANTOPRAZOLE SODIUM 40 MG PO TBEC: 40.0000 mg | DELAYED_RELEASE_TABLET | Freq: Two times a day (BID) | ORAL | 1 refills | Status: DC

## 2023-07-20 NOTE — Telephone Encounter (Signed)
Pt would like refllls on Alprazolam and Hydrocodone. Last RF was 06/17/23 and 06/13/23. Last OV 02/09/23. Pt uses [facility]. Thank you.

## 2023-07-21 ENCOUNTER — Other Ambulatory Visit: Payer: Self-pay | Admitting: Family Medicine

## 2023-07-21 DIAGNOSIS — F411 Generalized anxiety disorder: Secondary | ICD-10-CM

## 2023-07-21 MED ORDER — ALPRAZOLAM 0.5 MG PO TABS
0.5000 mg | ORAL_TABLET | Freq: Three times a day (TID) | ORAL | 0 refills | Status: DC | PRN
Start: 2023-07-21 — End: 2023-08-25

## 2023-07-21 MED ORDER — HYDROCODONE-ACETAMINOPHEN 5-325 MG PO TABS: 1.0000 | ORAL_TABLET | Freq: Four times a day (QID) | ORAL | 0 refills | Status: DC | PRN

## 2023-08-10 ENCOUNTER — Other Ambulatory Visit: Payer: PPO

## 2023-08-10 DIAGNOSIS — Z8546 Personal history of malignant neoplasm of prostate: Secondary | ICD-10-CM | POA: Diagnosis not present

## 2023-08-10 DIAGNOSIS — N1832 Chronic kidney disease, stage 3b: Secondary | ICD-10-CM

## 2023-08-11 LAB — PSA: Prostate Specific Ag, Serum: 0.2 ng/mL (ref 0.0–4.0)

## 2023-08-15 ENCOUNTER — Telehealth: Payer: Self-pay

## 2023-08-15 NOTE — Progress Notes (Signed)
Marland KitchenmaleHistory of Present Illness:    7.27.2021: TRUS/Bx.  Prostate volume 34.7 mL, PSA 5.3, PSAD 0.15.  3/12 cores revealed adenocarcinoma as follows:  Left apex lateral GS 3+3 and 95% of core Right apex medial GS 3+4 and 40% of core Right mid lateral, GS 3+3 in 10% of core   1.10.2022: I-125 brachytherapy, placement of SpaceOAR 9.20.2022: PSA 0.7 3.21.2023: PSA 1.0. 9.26.2023: PSA 1.1.   3.26.2024: PSA 0.2  10.21.2024:  Past Medical History:  Diagnosis Date   Anxiety    Arthritis    Asthma    Cancer (HCC)    right renal cancer, right radical nephrectomy 2004   Chronic back pain    Chronic hip pain, left    CKD (chronic kidney disease) stage 3, GFR 30-59 ml/min (HCC)    ckd stage 3 b    Complication of anesthesia    1 seizure after anesthesia 1999 or 2000 none since , recent surgeries no seizures   Coronary artery disease    a. s/p prior PCI to RCA and LCx in 2009   Depression    GERD (gastroesophageal reflux disease)    Gout    no recent flares   Heart disease    High blood pressure    History of PTCA 2 2004   2 stents   History of stomach ulcers    Hypercholesteremia    Hypertension    Kidney disease    Myocardial infarction (HCC) 2004   Prostate cancer (HCC)    3/12 cores upt to 40% grade 2 cancer. Planning brachytherapy (2021)   Proteinuria    Renal insufficiency    Tennis elbow     Past Surgical History:  Procedure Laterality Date   COLONOSCOPY WITH PROPOFOL N/A 10/28/2020   Procedure: COLONOSCOPY WITH PROPOFOL;  Surgeon: Dolores Frame, MD;  [facility];  Service: Gastroenterology;  Laterality: N/A;  2:30   coloscopy  10/28/2020   removed 7 polyps done at [place]   CYSTOSCOPY N/A 11/03/2020   Procedure: CYSTOSCOPY;  Surgeon: Marcine Matar, MD;  Location: [facility];  Service: Urology;  Laterality: N/A;   Heart catherization  2004   2 stents to heart cypher left circulflex   HERNIA REPAIR  1999   left groin  bilateral   HIP SURGERY Left 1999 or 2000   bond graft   Kidney removed Right 2003 or 2004   for cancer   LEFT HEART CATH AND CORONARY ANGIOGRAPHY N/A 09/29/2022   Procedure: LEFT HEART CATH AND CORONARY ANGIOGRAPHY;  Surgeon: Corky Crafts, MD;  Location: [facility];  Service: Cardiovascular;  Laterality: N/A;   POLYPECTOMY  10/28/2020   Procedure: POLYPECTOMY INTESTINAL;  Surgeon: Dolores Frame, MD;  [facility];  Service: Gastroenterology;;   PROSTATE BIOPSY  2021   RADIOACTIVE SEED IMPLANT N/A 11/03/2020   Procedure: RADIOACTIVE SEED IMPLANT/BRACHYTHERAPY IMPLANT;  Surgeon: Marcine Matar, MD;  [facility];  Service: Urology;  Laterality: N/A;  90 MINS   SPACE OAR INSTILLATION N/A 11/03/2020   Procedure: SPACE OAR INSTILLATION;  Surgeon: Marcine Matar, MD;  Location: [facility];  Service: Urology;  Laterality: N/A;    Home Medications:  Allergies as of 08/16/2023       Reactions   Other    Pt. Reports having seizures after anesthesia once 1999 or 2000 none since   Keflex [cephalexin] Rash        Medication List  Accurate as of August 15, 2023 11:27 AM. If you have any questions, ask your nurse or doctor.          acetaminophen 500 MG tablet Commonly known as: TYLENOL Take 500 mg by mouth every 6 (six) hours as needed.   allopurinol 100 MG tablet Commonly known as: ZYLOPRIM Take 2 tablets (200 mg total) by mouth daily.   ALPRAZolam 0.5 MG tablet Commonly known as: XANAX Take 1 tablet (0.5 mg total) by mouth 3 (three) times daily as needed for anxiety.   aspirin 81 MG tablet Take 81 mg by mouth daily.   atorvastatin 80 MG tablet Commonly known as: LIPITOR TAKE (1) TABLET BY MOUTH ONCE DAILY.   carvedilol 12.5 MG tablet Commonly known as: COREG Take 1 tablet (12.5 mg total) by mouth 2 (two) times daily. (AM & PM)   ezetimibe 10 MG tablet Commonly known as:  ZETIA Take 1 tablet (10 mg total) by mouth daily.   furosemide 20 MG tablet Commonly known as: LASIX Take 20 mg by mouth daily as needed for edema or fluid.   hydrALAZINE 25 MG tablet Commonly known as: APRESOLINE Take 1 tablet (25 mg total) by mouth 3 (three) times daily. (AM, NOON, PM)   HYDROcodone-acetaminophen 5-325 MG tablet Commonly known as: NORCO/VICODIN Take 1 tablet by mouth every 6 (six) hours as needed for moderate pain.   lisinopril 20 MG tablet Commonly known as: ZESTRIL Take 20 mg by mouth daily. (Dinner time)   Nicotrol 10 MG inhaler Generic drug: nicotine INHALE 1 CARTRIDGE INTO THE LUNGS AS NEEDED FOR SMOKING CESSATION.   NIFEdipine 90 MG 24 hr tablet Commonly known as: PROCARDIA XL/NIFEDICAL-XL Take 1 tablet (90 mg total) by mouth at bedtime.   nitroGLYCERIN 0.4 MG SL tablet Commonly known as: NITROSTAT Place 1 tablet (0.4 mg total) under the tongue every 5 (five) minutes as needed. Do not take within 2 days of viagra.   pantoprazole 40 MG tablet Commonly known as: PROTONIX Take 1 tablet (40 mg total) by mouth 2 (two) times daily.        Allergies:  Allergies  Allergen Reactions   Other     Pt. Reports having seizures after anesthesia once 1999 or 2000 none since   Keflex [Cephalexin] Rash    Family History  Problem Relation Age of Onset   Heart disease Other    Cancer Other    Kidney disease Other    Heart disease Father    Prostate cancer Father    Heart disease Mother    Lung cancer Mother    Kidney cancer Mother    Prostate cancer Maternal Uncle    Bladder Cancer Maternal Grandfather    Breast cancer Neg Hx    Colon cancer Neg Hx    Pancreatic cancer Neg Hx     Social History:  reports that he quit smoking about 20 years ago. His smoking use included cigarettes. He started smoking about 52 years ago. He has a 48 pack-year smoking history. He has never used smokeless tobacco. He reports current alcohol use. He reports that he does  not use drugs.  ROS: A complete review of systems was performed.  All systems are negative except for pertinent findings as noted.  Physical Exam:  Vital signs in last 24 hours: There were no vitals taken for this visit. Constitutional:  Alert and oriented, No acute distress Cardiovascular: Regular rate  Respiratory: Normal respiratory effort GI: Abdomen is soft, nontender, nondistended, no abdominal masses.  No CVAT.  Genitourinary: Normal male phallus, testes are descended bilaterally and non-tender and without masses, scrotum is normal in appearance without lesions or masses, perineum is normal on inspection. Lymphatic: No lymphadenopathy Neurologic: Grossly intact, no focal deficits Psychiatric: Normal mood and affect  I have reviewed prior pt notes  I have reviewed urinalysis results  I have independently reviewed prior imaging--prostate U/S  I have reviewed prior PSA and pathology results  IPSS sheet reviewed     Impression/Assessment:  ***  Plan:  ***

## 2023-08-15 NOTE — Telephone Encounter (Signed)
Prescription Request  08/15/2023  LOV:02/10/23  What is the name of the medication or equipment? HYDROcodone-acetaminophen (NORCO/VICODIN) 5-325 MG tablet [[id number]]   Have you contacted your pharmacy to request a refill? Yes   Which pharmacy would you like this sent to?  [facility] [place], [place] - [address] [place] [address] Phone: [phone] Fax: [phone]    Patient notified that their request is being sent to the clinical staff for review and that they should receive a response within 2 business days.   Please advise at [facility] [phone]

## 2023-08-16 ENCOUNTER — Other Ambulatory Visit: Payer: Self-pay | Admitting: Family Medicine

## 2023-08-16 ENCOUNTER — Encounter: Payer: Self-pay | Admitting: Urology

## 2023-08-16 ENCOUNTER — Ambulatory Visit: Payer: PPO | Admitting: Urology

## 2023-08-16 VITALS — BP 192/97 | HR 91

## 2023-08-16 DIAGNOSIS — Z08 Encounter for follow-up examination after completed treatment for malignant neoplasm: Secondary | ICD-10-CM | POA: Diagnosis not present

## 2023-08-16 DIAGNOSIS — Z8546 Personal history of malignant neoplasm of prostate: Secondary | ICD-10-CM

## 2023-08-16 DIAGNOSIS — N138 Other obstructive and reflux uropathy: Secondary | ICD-10-CM

## 2023-08-16 DIAGNOSIS — N5201 Erectile dysfunction due to arterial insufficiency: Secondary | ICD-10-CM

## 2023-08-16 DIAGNOSIS — F411 Generalized anxiety disorder: Secondary | ICD-10-CM

## 2023-08-16 MED ORDER — HYDROCODONE-ACETAMINOPHEN 5-325 MG PO TABS: 1.0000 | ORAL_TABLET | Freq: Four times a day (QID) | ORAL | 0 refills | Status: DC | PRN

## 2023-08-17 NOTE — Telephone Encounter (Signed)
Requested medication (s) are due for refill today: Yes  Requested medication (s) are on the active medication list: Yes  Last refill:  07/21/23  Future visit scheduled: No  Notes to clinic:  Not delegated.    Requested Prescriptions  Pending Prescriptions Disp Refills   ALPRAZolam (XANAX) 0.5 MG tablet [Pharmacy Med Name: alprazolam 0.5 mg tablet] 30 tablet 0    Sig: Take 1 tablet (0.5 mg total) by mouth 3 (three) times daily as needed for anxiety.     Not Delegated - Psychiatry: Anxiolytics/Hypnotics 2 Failed - 08/16/2023 10:47 AM      Failed - This refill cannot be delegated      Failed - Urine Drug Screen completed in last 360 days      Failed - Valid encounter within last 6 months    Recent Outpatient Visits           2 years ago ASCVD (arteriosclerotic cardiovascular disease)   [facility] Donita Brooks, MD   2 years ago Pure hypercholesterolemia   [facility] Donita Brooks, MD   3 years ago Benign essential HTN   [facility] Donita Brooks, MD   4 years ago Acute idiopathic gout, unspecified site   [facility] Pickard, Priscille Heidelberg, MD   4 years ago Acute bacterial rhinosinusitis   [facility] Pickard, Priscille Heidelberg, MD       Future Appointments             In 1 year Marcine Matar, MD [facility] - Patient is not pregnant

## 2023-08-18 NOTE — Telephone Encounter (Signed)
Pt called in to check on status of this refill:   ALPRAZolam Prudy Feeler) 0.5 MG tablet [[id number]]  LOV: 02/10/23  PHARMACY: [facility] - [place], [place] - [address] [place], [place] [address] Phone: [phone]  Fax: [phone]  CB#: [phone]

## 2023-08-23 NOTE — Telephone Encounter (Signed)
Pt called in to check on status of this refill. Pt has been trying to get this refill since last week 08/16/23. Pt would like a cb from nurse if this refill has been sent in. Pt stated ok to lvm on either number please.  ALPRAZolam (XANAX) 0.5 MG tablet [[id number]]   Cb#: [phone]

## 2023-08-25 NOTE — Telephone Encounter (Signed)
Pt called in again to check on status of this refill. Please advise

## 2023-09-14 ENCOUNTER — Other Ambulatory Visit: Payer: Self-pay | Admitting: Family Medicine

## 2023-09-14 DIAGNOSIS — F411 Generalized anxiety disorder: Secondary | ICD-10-CM

## 2023-09-15 NOTE — Telephone Encounter (Signed)
Requested medications are due for refill today.  yes  Requested medications are on the active medications list.  yes  Last refill. 08/25/2023 #30 0 rf  Future visit scheduled.   no  Notes to clinic.  Refill not delegated.    Requested Prescriptions  Pending Prescriptions Disp Refills   ALPRAZolam (XANAX) 0.5 MG tablet [Pharmacy Med Name: alprazolam 0.5 mg tablet] 30 tablet 0    Sig: TAKE ONE TABLET BY MOUTH 3 TIMES A DAY AS NEEDED FOR ANXIETY     Not Delegated - Psychiatry: Anxiolytics/Hypnotics 2 Failed - 09/14/2023  9:47 AM      Failed - This refill cannot be delegated      Failed - Urine Drug Screen completed in last 360 days      Failed - Valid encounter within last 6 months    Recent Outpatient Visits           2 years ago ASCVD (arteriosclerotic cardiovascular disease)   [facility] Donita Brooks, MD   3 years ago Pure hypercholesterolemia   [facility] Tanya Nones, Priscille Heidelberg, MD   3 years ago Benign essential HTN   [facility] Donita Brooks, MD   4 years ago Acute idiopathic gout, unspecified site   [facility] Pickard, Priscille Heidelberg, MD   4 years ago Acute bacterial rhinosinusitis   [facility] Pickard, Priscille Heidelberg, MD       Future Appointments             In 11 months Marcine Matar, MD [facility] [place]            Passed - Patient is not pregnant       HYDROcodone-acetaminophen (NORCO/VICODIN) 5-325 MG tablet [Pharmacy Med Name: hydrocodone 5 mg-acetaminophen 325 mg tablet] 90 tablet 0    Sig: TAKE 1 TABLET BY MOUTH EVERY 6 HOURS AS NEEDED FOR MODERATE PAIN     Not Delegated - Analgesics:  Opioid Agonist Combinations Failed - 09/14/2023  9:47 AM      Failed - This refill cannot be delegated      Failed - Urine Drug Screen completed in last 360 days      Failed - Valid encounter within last 3 months    Recent Outpatient Visits           2 years ago  ASCVD (arteriosclerotic cardiovascular disease)   [facility] Donita Brooks, MD   3 years ago Pure hypercholesterolemia   [facility] Tanya Nones, Priscille Heidelberg, MD   3 years ago Benign essential HTN   [facility] Donita Brooks, MD   4 years ago Acute idiopathic gout, unspecified site   [facility] Pickard, Priscille Heidelberg, MD   4 years ago Acute bacterial rhinosinusitis   [facility] Pickard, Priscille Heidelberg, MD       Future Appointments             In 11 months Marcine Matar, MD [facility] [place]

## 2023-09-19 ENCOUNTER — Other Ambulatory Visit: Payer: Self-pay

## 2023-09-19 DIAGNOSIS — F411 Generalized anxiety disorder: Secondary | ICD-10-CM

## 2023-09-19 MED ORDER — ALPRAZOLAM 0.5 MG PO TABS: 0.5000 mg | ORAL_TABLET | Freq: Three times a day (TID) | ORAL | 0 refills | Status: DC | PRN

## 2023-09-19 MED ORDER — HYDROCODONE-ACETAMINOPHEN 5-325 MG PO TABS: 1.0000 | ORAL_TABLET | Freq: Four times a day (QID) | ORAL | 0 refills | Status: DC | PRN

## 2023-09-19 NOTE — Telephone Encounter (Signed)
Copied from CRM [id number]. Topic: Clinical - Prescription Issue >> Sep 19, 2023  1:01 PM Brandon Buck H wrote: Reason for CRM: Pt is calling in regards to prescriptions for ALPRAZolam (XANAX) 0.5 MG tablet and HYDROcodone-acetaminophen (NORCO/VICODIN) 5-325 MG tablet. Informed him meds were denied due to not having office visit within 6 months per Nanette, apt has been scheduled with Dr. Tanya Nones at the earliest availability, pt stated he cannot go without his meds for that long. Would like a call back regarding his meds at his mobile number

## 2023-10-10 ENCOUNTER — Encounter: Payer: Self-pay | Admitting: Family Medicine

## 2023-10-10 ENCOUNTER — Ambulatory Visit (INDEPENDENT_AMBULATORY_CARE_PROVIDER_SITE_OTHER): Payer: PPO | Admitting: Family Medicine

## 2023-10-10 VITALS — BP 146/90 | HR 61 | Temp 98.5°F | Ht 72.0 in | Wt 216.2 lb

## 2023-10-10 DIAGNOSIS — Z9861 Coronary angioplasty status: Secondary | ICD-10-CM | POA: Diagnosis not present

## 2023-10-10 DIAGNOSIS — I10 Essential (primary) hypertension: Secondary | ICD-10-CM | POA: Diagnosis not present

## 2023-10-10 DIAGNOSIS — E7800 Pure hypercholesterolemia, unspecified: Secondary | ICD-10-CM

## 2023-10-10 DIAGNOSIS — I2510 Atherosclerotic heart disease of native coronary artery without angina pectoris: Secondary | ICD-10-CM | POA: Diagnosis not present

## 2023-10-10 DIAGNOSIS — G629 Polyneuropathy, unspecified: Secondary | ICD-10-CM | POA: Diagnosis not present

## 2023-10-10 MED ORDER — ATORVASTATIN CALCIUM 80 MG PO TABS: 80.0000 mg | ORAL_TABLET | Freq: Every day | ORAL | 3 refills | Status: AC

## 2023-10-10 MED ORDER — EZETIMIBE 10 MG PO TABS: 10.0000 mg | ORAL_TABLET | Freq: Every day | ORAL | 3 refills | Status: AC

## 2023-10-10 MED ORDER — GABAPENTIN 300 MG PO CAPS: 300.0000 mg | ORAL_CAPSULE | Freq: Every day | ORAL | 3 refills | Status: AC

## 2023-10-10 NOTE — Progress Notes (Signed)
Subjective:    Patient ID: Brandon Buck, male    DOB: [birth year], 62 y.o.   MRN: [id number]  Patient has a history of prostate cancer s/p radioactive seed placement.  He also has a history of HTN, HLD, and CAD s/p PTCA.  He is here today for follow-up of his hypertension.  He is currently taking carvedilol twice a day, lisinopril, nifedipine, and hydralazine 3 times a day.  He states that his blood pressure at home can be widely variable.  He reports systolic blood pressures greater than 180 at night.  He denies any chest pain shortness of breath or dyspnea on exertion.  100 100s this-less than 120 and even lightheadedness sometimes in the morning.  He also complains of burning and stinging pain in both feet.  Today he has palpable dorsalis pedis and posterior tibialis pulses bilaterally.  The pain as a pins-and-needles stinging pain in both feet.  He denies any claudication.  The pain seems to be worse at night when he tries to sleep. Past Medical History:  Diagnosis Date   Anxiety    Arthritis    Asthma    Cancer (HCC)    right renal cancer, right radical nephrectomy 2004   Chronic back pain    Chronic hip pain, left    CKD (chronic kidney disease) stage 3, GFR 30-59 ml/min (HCC)    ckd stage 3 b    Complication of anesthesia    1 seizure after anesthesia 1999 or 2000 none since , recent surgeries no seizures   Coronary artery disease    a. s/p prior PCI to RCA and LCx in 2009   Depression    GERD (gastroesophageal reflux disease)    Gout    no recent flares   Heart disease    High blood pressure    History of PTCA 2 2004   2 stents   History of stomach ulcers    Hypercholesteremia    Hypertension    Kidney disease    Myocardial infarction (HCC) 2004   Prostate cancer (HCC)    3/12 cores upt to 40% grade 2 cancer. Planning brachytherapy (2021)   Proteinuria    Renal insufficiency    Tennis elbow    Past Surgical History:  Procedure Laterality Date   COLONOSCOPY WITH  PROPOFOL N/A 10/28/2020   Procedure: COLONOSCOPY WITH PROPOFOL;  Surgeon: Dolores Frame, MD;  [facility];  Service: Gastroenterology;  Laterality: N/A;  2:30   coloscopy  10/28/2020   removed 7 polyps done at [place]   CYSTOSCOPY N/A 11/03/2020   Procedure: CYSTOSCOPY;  Surgeon: Marcine Matar, MD;  [facility];  Service: Urology;  Laterality: N/A;   Heart catherization  2004   2 stents to heart cypher left circulflex   HERNIA REPAIR  1999   left groin bilateral   HIP SURGERY Left 1999 or 2000   bond graft   Kidney removed Right 2003 or 2004   for cancer   LEFT HEART CATH AND CORONARY ANGIOGRAPHY N/A 09/29/2022   Procedure: LEFT HEART CATH AND CORONARY ANGIOGRAPHY;  Surgeon: Corky Crafts, MD;  Location: [facility] CV [facility];  Service: Cardiovascular;  Laterality: N/A;   POLYPECTOMY  10/28/2020   Procedure: POLYPECTOMY INTESTINAL;  Surgeon: Dolores Frame, MD;  Location: [facility];  Service: Gastroenterology;;   PROSTATE BIOPSY  2021   RADIOACTIVE SEED IMPLANT N/A 11/03/2020   Procedure: RADIOACTIVE SEED IMPLANT/BRACHYTHERAPY IMPLANT;  Surgeon: Retta Diones,  Jeannett Senior, MD;  Location: [facility];  Service: Urology;  Laterality: N/A;  90 MINS   SPACE OAR INSTILLATION N/A 11/03/2020   Procedure: SPACE OAR INSTILLATION;  Surgeon: Marcine Matar, MD;  Location: [facility];  Service: Urology;  Laterality: N/A;   Current Outpatient Medications on File Prior to Visit  Medication Sig Dispense Refill   allopurinol (ZYLOPRIM) 100 MG tablet Take 2 tablets (200 mg total) by mouth daily. 180 tablet 1   ALPRAZolam (XANAX) 0.5 MG tablet Take 1 tablet (0.5 mg total) by mouth 3 (three) times daily as needed for anxiety. 30 tablet 0   aspirin 81 MG tablet Take 81 mg by mouth daily.     carvedilol (COREG) 12.5 MG tablet Take 1 tablet (12.5 mg total) by mouth 2 (two) times daily. (AM & PM)     furosemide  (LASIX) 20 MG tablet Take 20 mg by mouth daily as needed for edema or fluid.     hydrALAZINE (APRESOLINE) 25 MG tablet Take 1 tablet (25 mg total) by mouth 3 (three) times daily. (AM, NOON, PM) 270 tablet 3   HYDROcodone-acetaminophen (NORCO/VICODIN) 5-325 MG tablet Take 1 tablet by mouth every 6 (six) hours as needed for moderate pain (pain score 4-6). 90 tablet 0   lisinopril (ZESTRIL) 20 MG tablet Take 20 mg by mouth daily. (Dinner time)     NICOTROL 10 MG inhaler INHALE 1 CARTRIDGE INTO THE LUNGS AS NEEDED FOR SMOKING CESSATION. 168 each 0   NIFEdipine (PROCARDIA XL/NIFEDICAL-XL) 90 MG 24 hr tablet Take 1 tablet (90 mg total) by mouth at bedtime.     nitroGLYCERIN (NITROSTAT) 0.4 MG SL tablet Place 1 tablet (0.4 mg total) under the tongue every 5 (five) minutes as needed. Do not take within 2 days of viagra. 25 tablet 3   pantoprazole (PROTONIX) 40 MG tablet Take 1 tablet (40 mg total) by mouth 2 (two) times daily. 180 tablet 1   No current facility-administered medications on file prior to visit.    Allergies  Allergen Reactions   Other     Pt. Reports having seizures after anesthesia once 1999 or 2000 none since   Keflex [Cephalexin] Rash   Social History   Socioeconomic History   Marital status: Married    Spouse name: Sheree   Number of children: 0   Years of education: Not on file   Highest education level: Not on file  Occupational History   Occupation: retired  Tobacco Use   Smoking status: Former    Current packs/day: 0.00    Average packs/day: 1.5 packs/day for 32.0 years (48.0 ttl pk-yrs)    Types: Cigarettes    Start date: 01/26/1971    Quit date: 01/26/2003    Years since quitting: 20.7   Smokeless tobacco: Never  Vaping Use   Vaping status: Never Used  Substance and Sexual Activity   Alcohol use: Yes    Alcohol/week: 0.0 standard drinks of alcohol    Comment: occ 2 week   Drug use: No   Sexual activity: Yes  Other Topics Concern   Not on file  Social  History Narrative   Worked as an Personnel officer.    Married x 45 years in 2022.   Social Drivers of Corporate investment banker Strain: Low Risk  (10/04/2022)   Overall Financial Resource Strain (CARDIA)    Difficulty of Paying Living Expenses: Not hard at all  Food Insecurity: No Food Insecurity (09/29/2022)   Hunger Vital Sign  Worried About Programme researcher, broadcasting/film/video in the Last Year: Never true    Ran Out of Food in the Last Year: Never true  Transportation Needs: No Transportation Needs (10/04/2022)   PRAPARE - Administrator, Civil Service (Medical): No    Lack of Transportation (Non-Medical): No  Physical Activity: Sufficiently Active (07/24/2021)   Exercise Vital Sign    Days of Exercise per Week: 5 days    Minutes of Exercise per Session: 30 min  Stress: No Stress Concern Present (07/24/2021)   [employer] of Occupational Health - Occupational Stress Questionnaire    Feeling of Stress : Not at all  Social Connections: Socially Integrated (07/24/2021)   Social Connection and Isolation Panel [NHANES]    Frequency of Communication with Friends and Family: More than three times a week    Frequency of Social Gatherings with Friends and Family: Three times a week    Attends Religious Services: 1 to 4 times per year    Active Member of Clubs or Organizations: No    Attends Banker Meetings: 1 to 4 times per year    Marital Status: Married  Catering manager Violence: Not At Risk (09/29/2022)   Humiliation, Afraid, Rape, and Kick questionnaire    Fear of Current or Ex-Partner: No    Emotionally Abused: No    Physically Abused: No    Sexually Abused: No      Review of Systems  All other systems reviewed and are negative.      Objective:   Physical Exam Vitals reviewed.  Constitutional:      General: He is not in acute distress.    Appearance: He is well-developed. He is not diaphoretic.  HENT:     Right Ear: External ear normal.     Left Ear:  External ear normal.     Nose: Nose normal.     Mouth/Throat:     Pharynx: No oropharyngeal exudate.  Eyes:     General: No scleral icterus.    Conjunctiva/sclera: Conjunctivae normal.  Cardiovascular:     Rate and Rhythm: Normal rate and regular rhythm.     Pulses:          Dorsalis pedis pulses are 2+ on the right side and 2+ on the left side.       Posterior tibial pulses are 2+ on the right side and 2+ on the left side.     Heart sounds: Normal heart sounds.  Pulmonary:     Effort: Pulmonary effort is normal. No respiratory distress.     Breath sounds: Normal breath sounds. No wheezing or rales.  Abdominal:     General: Bowel sounds are normal. There is no distension.     Palpations: Abdomen is soft.     Tenderness: There is no abdominal tenderness. There is no rebound.     Hernia: There is no hernia in the left inguinal area.  Genitourinary:    Penis: Normal.      Testes: Normal.        Right: Mass or tenderness not present.        Left: Mass or tenderness not present.  Musculoskeletal:     Cervical back: Neck supple.     Right foot: Normal range of motion. No deformity or bunion.     Left foot: Normal range of motion. No deformity or bunion.  Lymphadenopathy:     Cervical: No cervical adenopathy.  Assessment & Plan:  Essential hypertension - Plan: CBC with Differential/Platelet, COMPLETE METABOLIC PANEL WITH GFR, Lipid panel  CAD S/P percutaneous coronary angioplasty - Plan: ezetimibe (ZETIA) 10 MG tablet  Pure hypercholesterolemia - Plan: ezetimibe (ZETIA) 10 MG tablet  Neuropathy  The patient has chronic kidney disease.  Therefore I want to keep his blood pressure less than 130/80.  The patient has been checking his blood pressure multiple times a day and has kept a record at home.  I have asked him to bring in his log book for me to review.  We may need to increase his hydralazine to achieve better control of his blood pressures.  If his blood  pressure is truly low in the morning, I may only increase the hydralazine later in the day that he takes at lunchtime and around bedtime.  However I would like to review his numbers before making a change.  Check a CMP and a lipid panel.  Goal LDL cholesterol is less than 55.  I will try the patient on gabapentin 300 mg p.o. nightly for neuropathy.

## 2023-10-11 LAB — CBC WITH DIFFERENTIAL/PLATELET
Absolute Lymphocytes: 1245 {cells}/uL (ref 850–3900)
Absolute Monocytes: 443 {cells}/uL (ref 200–950)
Basophils Absolute: 68 {cells}/uL (ref 0–200)
Basophils Relative: 0.9 %
Eosinophils Absolute: 53 {cells}/uL (ref 15–500)
Eosinophils Relative: 0.7 %
HCT: 50.6 % — ABNORMAL HIGH (ref 38.5–50.0)
Hemoglobin: 17.4 g/dL — ABNORMAL HIGH (ref 13.2–17.1)
MCH: 32.3 pg (ref 27.0–33.0)
MCHC: 34.4 g/dL (ref 32.0–36.0)
MCV: 94.1 fL (ref 80.0–100.0)
MPV: 9.9 fL (ref 7.5–12.5)
Monocytes Relative: 5.9 %
Neutro Abs: 5693 {cells}/uL (ref 1500–7800)
Neutrophils Relative %: 75.9 %
Platelets: 221 10*3/uL (ref 140–400)
RBC: 5.38 10*6/uL (ref 4.20–5.80)
RDW: 13 % (ref 11.0–15.0)
Total Lymphocyte: 16.6 %
WBC: 7.5 10*3/uL (ref 3.8–10.8)

## 2023-10-11 LAB — LIPID PANEL
Cholesterol: 213 mg/dL — ABNORMAL HIGH (ref <200)
HDL: 48 mg/dL (ref >=40)
LDL Cholesterol (Calc): 139 mg/dL — ABNORMAL HIGH
Non-HDL Cholesterol (Calc): 165 mg/dL — ABNORMAL HIGH (ref <130)
Total CHOL/HDL Ratio: 4.4 (calc) (ref <5.0)
Triglycerides: 139 mg/dL (ref <150)

## 2023-10-11 LAB — COMPLETE METABOLIC PANEL WITH GFR
AG Ratio: 1.5 (calc) (ref 1.0–2.5)
ALT: 19 U/L (ref 9–46)
AST: 19 U/L (ref 10–35)
Albumin: 4.3 g/dL (ref 3.6–5.1)
Alkaline phosphatase (APISO): 75 U/L (ref 35–144)
BUN/Creatinine Ratio: 8 (calc) (ref 6–22)
BUN: 11 mg/dL (ref 7–25)
CO2: 28 mmol/L (ref 20–32)
Calcium: 9.7 mg/dL (ref 8.6–10.3)
Chloride: 101 mmol/L (ref 98–110)
Creat: 1.43 mg/dL — ABNORMAL HIGH (ref 0.70–1.35)
Globulin: 2.8 g/dL (ref 1.9–3.7)
Glucose, Bld: 97 mg/dL (ref 65–99)
Potassium: 4.5 mmol/L (ref 3.5–5.3)
Sodium: 139 mmol/L (ref 135–146)
Total Bilirubin: 0.5 mg/dL (ref 0.2–1.2)
Total Protein: 7.1 g/dL (ref 6.1–8.1)
eGFR: 55 mL/min/{1.73_m2} — ABNORMAL LOW (ref >=60)

## 2023-10-14 ENCOUNTER — Other Ambulatory Visit: Payer: Self-pay | Admitting: Family Medicine

## 2023-10-14 DIAGNOSIS — F411 Generalized anxiety disorder: Secondary | ICD-10-CM

## 2023-11-15 ENCOUNTER — Other Ambulatory Visit: Payer: Self-pay | Admitting: Family Medicine

## 2023-11-15 DIAGNOSIS — F411 Generalized anxiety disorder: Secondary | ICD-10-CM

## 2023-11-15 NOTE — Telephone Encounter (Signed)
Requested medications are due for refill today.  yes  Requested medications are on the active medications list.  yes  Last refill. 10/17/2023  Future visit scheduled.   no  Notes to clinic.  Refills not delegated.    Requested Prescriptions  Pending Prescriptions Disp Refills   ALPRAZolam (XANAX) 0.5 MG tablet [Pharmacy Med Name: alprazolam 0.5 mg tablet] 30 tablet 0    Sig: Take 1 tablet (0.5 mg total) by mouth 3 (three) times daily as needed for anxiety.     Not Delegated - Psychiatry: Anxiolytics/Hypnotics 2 Failed - 11/15/2023  1:38 PM      Failed - This refill cannot be delegated      Failed - Urine Drug Screen completed in last 360 days      Failed - Valid encounter within last 6 months    Recent Outpatient Visits           2 years ago ASCVD (arteriosclerotic cardiovascular disease)   [facility] Pickard, Priscille Heidelberg, MD   3 years ago Pure hypercholesterolemia   [facility] Tanya Nones, Priscille Heidelberg, MD   3 years ago Benign essential HTN   [facility] Donita Brooks, MD   4 years ago Acute idiopathic gout, unspecified site   [facility] Pickard, Priscille Heidelberg, MD   4 years ago Acute bacterial rhinosinusitis   [facility] Pickard, Priscille Heidelberg, MD       Future Appointments             In 9 months Marcine Matar, MD [facility] - Patient is not pregnant       HYDROcodone-acetaminophen (NORCO/VICODIN) 5-325 MG tablet [Pharmacy Med Name: hydrocodone 5 mg-acetaminophen 325 mg tablet] 90 tablet 0    Sig: Take 1 tablet by mouth every 6 (six) hours as needed for moderate pain (pain score 4-6).     Not Delegated - Analgesics:  Opioid Agonist Combinations Failed - 11/15/2023  1:38 PM      Failed - This refill cannot be delegated      Failed - Urine Drug Screen completed in last 360 days      Failed - Valid encounter within last 3 months    Recent  Outpatient Visits           2 years ago ASCVD (arteriosclerotic cardiovascular disease)   [facility] Donita Brooks, MD   3 years ago Pure hypercholesterolemia   [facility] Donita Brooks, MD   3 years ago Benign essential HTN   [facility] Donita Brooks, MD   4 years ago Acute idiopathic gout, unspecified site   [facility] Pickard, Priscille Heidelberg, MD   4 years ago Acute bacterial rhinosinusitis   [facility] Pickard, Priscille Heidelberg, MD       Future Appointments             In 9 months Marcine Matar, MD [facility] [place]

## 2023-11-18 ENCOUNTER — Other Ambulatory Visit: Payer: Self-pay | Admitting: Family Medicine

## 2023-11-18 ENCOUNTER — Telehealth: Payer: Self-pay

## 2023-11-18 DIAGNOSIS — F411 Generalized anxiety disorder: Secondary | ICD-10-CM

## 2023-11-18 MED ORDER — ALPRAZOLAM 0.5 MG PO TABS: 0.5000 mg | ORAL_TABLET | Freq: Three times a day (TID) | ORAL | 0 refills | Status: DC | PRN

## 2023-11-18 MED ORDER — HYDROCODONE-ACETAMINOPHEN 5-325 MG PO TABS: 1.0000 | ORAL_TABLET | Freq: Four times a day (QID) | ORAL | 0 refills | Status: DC | PRN

## 2023-11-18 NOTE — Telephone Encounter (Signed)
Copied from CRM [id number]. Topic: Clinical - Prescription Issue >> Nov 18, 2023  4:11 PM Brandon Buck wrote: Reason for CRM: Pharmacy sent request for Xanax and Hydrocodone 3 days ago and its still pending. Patient almost out of the medication and asking if this can be expedited.

## 2023-12-12 ENCOUNTER — Other Ambulatory Visit: Payer: Self-pay | Admitting: Family Medicine

## 2023-12-12 DIAGNOSIS — F411 Generalized anxiety disorder: Secondary | ICD-10-CM

## 2024-01-09 ENCOUNTER — Telehealth: Payer: Self-pay

## 2024-01-09 ENCOUNTER — Other Ambulatory Visit: Payer: Self-pay

## 2024-01-09 ENCOUNTER — Other Ambulatory Visit: Payer: Self-pay | Admitting: Family Medicine

## 2024-01-09 ENCOUNTER — Other Ambulatory Visit: Payer: PPO

## 2024-01-09 DIAGNOSIS — E7800 Pure hypercholesterolemia, unspecified: Secondary | ICD-10-CM | POA: Diagnosis not present

## 2024-01-09 DIAGNOSIS — E7849 Other hyperlipidemia: Secondary | ICD-10-CM | POA: Diagnosis not present

## 2024-01-09 DIAGNOSIS — K219 Gastro-esophageal reflux disease without esophagitis: Secondary | ICD-10-CM

## 2024-01-09 DIAGNOSIS — F411 Generalized anxiety disorder: Secondary | ICD-10-CM

## 2024-01-09 MED ORDER — PANTOPRAZOLE SODIUM 40 MG PO TBEC: 40.0000 mg | DELAYED_RELEASE_TABLET | Freq: Two times a day (BID) | ORAL | 3 refills | Status: AC

## 2024-01-09 MED ORDER — HYDROCODONE-ACETAMINOPHEN 5-325 MG PO TABS: 1.0000 | ORAL_TABLET | Freq: Four times a day (QID) | ORAL | 0 refills | Status: DC | PRN

## 2024-01-09 MED ORDER — ALPRAZOLAM 0.5 MG PO TABS: 0.5000 mg | ORAL_TABLET | Freq: Three times a day (TID) | ORAL | 0 refills | Status: DC | PRN

## 2024-01-09 NOTE — Telephone Encounter (Signed)
 Pt came into office to request refills of these meds:  pantoprazole (PROTONIX) 40 MG tablet [[id number]]  ALPRAZolam (XANAX) 0.5 MG tablet [[id number]] HYDROcodone-acetaminophen (NORCO/VICODIN) 5-325 MG tablet [[id number]]  Pt is scheduled for an OV on 01/17/24 with pcp.  LOV: 10/10/23  PHARMACY: [facility] [place] [place] - [address] [place], [place] [address] Phone: [phone]  Fax: [phone]  CB#: [phone]

## 2024-01-10 LAB — LIPID PANEL
Cholesterol: 137 mg/dL (ref <200)
HDL: 51 mg/dL (ref >=40)
LDL Cholesterol (Calc): 72 mg/dL
Non-HDL Cholesterol (Calc): 86 mg/dL (ref <130)
Total CHOL/HDL Ratio: 2.7 (calc) (ref <5.0)
Triglycerides: 49 mg/dL (ref <150)

## 2024-01-17 ENCOUNTER — Encounter: Payer: Self-pay | Admitting: Family Medicine

## 2024-01-17 ENCOUNTER — Ambulatory Visit (INDEPENDENT_AMBULATORY_CARE_PROVIDER_SITE_OTHER): Admitting: Family Medicine

## 2024-01-17 VITALS — BP 148/84 | HR 59 | Temp 98.5°F | Ht 72.0 in | Wt 210.0 lb

## 2024-01-17 DIAGNOSIS — K5792 Diverticulitis of intestine, part unspecified, without perforation or abscess without bleeding: Secondary | ICD-10-CM

## 2024-01-17 DIAGNOSIS — H6121 Impacted cerumen, right ear: Secondary | ICD-10-CM | POA: Diagnosis not present

## 2024-01-17 DIAGNOSIS — I10 Essential (primary) hypertension: Secondary | ICD-10-CM

## 2024-01-17 MED ORDER — HYDRALAZINE HCL 50 MG PO TABS: 50.0000 mg | ORAL_TABLET | Freq: Three times a day (TID) | ORAL | 3 refills | Status: AC

## 2024-01-17 MED ORDER — AMOXICILLIN-POT CLAVULANATE 875-125 MG PO TABS: 1.0000 | ORAL_TABLET | Freq: Two times a day (BID) | ORAL | 0 refills | Status: DC

## 2024-01-17 NOTE — Progress Notes (Signed)
 Subjective:    Patient ID: Brandon Buck, male    DOB: [birth year], 63 y.o.   MRN: [id number]  Patient has a history of prostate cancer s/p radioactive seed placement.  He also has a history of HTN, HLD, and CAD s/p PTCA.  He is here today for follow-up of his hypertension.  He is currently taking carvedilol twice a day, lisinopril, nifedipine, and hydralazine 3 times a day.  Patient's blood pressure at home has been between 140 and 170 systolic.  He denies any chest pain.  However for the last week, the patient has been having left lower quadrant abdominal pain.  He has a history of diverticulitis.  He reports tenderness to palpation in the left lower quadrant.  He denies any nausea or vomiting.  He denies any fever.  He does report constipation.  Pain is moderate to severe in intensity.  He also complains of hearing loss in his right ear.  On examination he has a cerumen impaction Past Medical History:  Diagnosis Date   Anxiety    Arthritis    Asthma    Cancer (HCC)    right renal cancer, right radical nephrectomy 2004   Chronic back pain    Chronic hip pain, left    CKD (chronic kidney disease) stage 3, GFR 30-59 ml/min (HCC)    ckd stage 3 b    Complication of anesthesia    1 seizure after anesthesia 1999 or 2000 none since , recent surgeries no seizures   Coronary artery disease    a. s/p prior PCI to RCA and LCx in 2009   Depression    GERD (gastroesophageal reflux disease)    Gout    no recent flares   Heart disease    High blood pressure    History of PTCA 2 2004   2 stents   History of stomach ulcers    Hypercholesteremia    Hypertension    Kidney disease    Myocardial infarction (HCC) 2004   Prostate cancer (HCC)    3/12 cores upt to 40% grade 2 cancer. Planning brachytherapy (2021)   Proteinuria    Renal insufficiency    Tennis elbow    Past Surgical History:  Procedure Laterality Date   COLONOSCOPY WITH PROPOFOL N/A 10/28/2020   Procedure: COLONOSCOPY WITH  PROPOFOL;  Surgeon: Dolores Frame, MD;  [facility];  Service: Gastroenterology;  Laterality: N/A;  2:30   coloscopy  10/28/2020   removed 7 polyps done at [place]   CYSTOSCOPY N/A 11/03/2020   Procedure: CYSTOSCOPY;  Surgeon: Marcine Matar, MD;  Location: [facility];  Service: Urology;  Laterality: N/A;   Heart catherization  2004   2 stents to heart cypher left circulflex   HERNIA REPAIR  1999   left groin bilateral   HIP SURGERY Left 1999 or 2000   bond graft   Kidney removed Right 2003 or 2004   for cancer   LEFT HEART CATH AND CORONARY ANGIOGRAPHY N/A 09/29/2022   Procedure: LEFT HEART CATH AND CORONARY ANGIOGRAPHY;  Surgeon: Corky Crafts, MD;  Location: [facility];  Service: Cardiovascular;  Laterality: N/A;   POLYPECTOMY  10/28/2020   Procedure: POLYPECTOMY INTESTINAL;  Surgeon: Dolores Frame, MD;  [facility];  Service: Gastroenterology;;   PROSTATE BIOPSY  2021   RADIOACTIVE SEED IMPLANT N/A 11/03/2020   Procedure: RADIOACTIVE SEED IMPLANT/BRACHYTHERAPY IMPLANT;  Surgeon: Marcine Matar, MD;  Location: [facility];  Service: Urology;  Laterality: N/A;  90 MINS   SPACE OAR INSTILLATION N/A 11/03/2020   Procedure: SPACE OAR INSTILLATION;  Surgeon: Marcine Matar, MD;  Location: [facility];  Service: Urology;  Laterality: N/A;   Current Outpatient Medications on File Prior to Visit  Medication Sig Dispense Refill   allopurinol (ZYLOPRIM) 100 MG tablet Take 2 tablets (200 mg total) by mouth daily. 180 tablet 1   ALPRAZolam (XANAX) 0.5 MG tablet Take 1 tablet (0.5 mg total) by mouth 3 (three) times daily as needed for anxiety. (Patient taking differently: Take 0.5 mg by mouth 3 (three) times daily as needed for anxiety (Pt states he only take 1 per day, 01/17/24.).) 30 tablet 0   aspirin 81 MG tablet Take 81 mg by mouth daily.     atorvastatin (LIPITOR) 80 MG tablet  Take 1 tablet (80 mg total) by mouth daily. 90 tablet 3   carvedilol (COREG) 12.5 MG tablet Take 1 tablet (12.5 mg total) by mouth 2 (two) times daily. (AM & PM)     ezetimibe (ZETIA) 10 MG tablet Take 1 tablet (10 mg total) by mouth daily. 90 tablet 3   furosemide (LASIX) 20 MG tablet Take 20 mg by mouth daily as needed for edema or fluid.     gabapentin (NEURONTIN) 300 MG capsule Take 1 capsule (300 mg total) by mouth at bedtime. 30 capsule 3   hydrALAZINE (APRESOLINE) 25 MG tablet Take 1 tablet (25 mg total) by mouth 3 (three) times daily. (AM, NOON, PM) 270 tablet 3   HYDROcodone-acetaminophen (NORCO/VICODIN) 5-325 MG tablet Take 1 tablet by mouth every 6 (six) hours as needed for moderate pain (pain score 4-6). 90 tablet 0   HYDROcodone-acetaminophen (NORCO/VICODIN) 5-325 MG tablet Take 1 tablet by mouth every 6 (six) hours as needed for moderate pain (pain score 4-6). 90 tablet 0   lisinopril (ZESTRIL) 20 MG tablet Take 20 mg by mouth daily. (Dinner time)     NICOTROL 10 MG inhaler INHALE 1 CARTRIDGE INTO THE LUNGS AS NEEDED FOR SMOKING CESSATION. 168 each 0   NIFEdipine (PROCARDIA XL/NIFEDICAL-XL) 90 MG 24 hr tablet Take 1 tablet (90 mg total) by mouth at bedtime.     nitroGLYCERIN (NITROSTAT) 0.4 MG SL tablet Place 1 tablet (0.4 mg total) under the tongue every 5 (five) minutes as needed. Do not take within 2 days of viagra. 25 tablet 3   pantoprazole (PROTONIX) 40 MG tablet Take 1 tablet (40 mg total) by mouth 2 (two) times daily. 180 tablet 3   No current facility-administered medications on file prior to visit.    Allergies  Allergen Reactions   Other     Pt. Reports having seizures after anesthesia once 1999 or 2000 none since   Keflex [Cephalexin] Rash   Social History   Socioeconomic History   Marital status: Married    Spouse name: Sheree   Number of children: 0   Years of education: Not on file   Highest education level: Not on file  Occupational History   Occupation:  retired  Tobacco Use   Smoking status: Former    Current packs/day: 0.00    Average packs/day: 1.5 packs/day for 32.0 years (48.0 ttl pk-yrs)    Types: Cigarettes    Start date: 01/26/1971    Quit date: 01/26/2003    Years since quitting: 20.9   Smokeless tobacco: Never  Vaping Use   Vaping status: Never Used  Substance and Sexual Activity  Alcohol use: Yes    Alcohol/week: 0.0 standard drinks of alcohol    Comment: occ 2 week   Drug use: No   Sexual activity: Yes  Other Topics Concern   Not on file  Social History Narrative   Worked as an Personnel officer.    Married x 45 years in 2022.   Social Drivers of Corporate investment banker Strain: Low Risk  (10/04/2022)   Overall Financial Resource Strain (CARDIA)    Difficulty of Paying Living Expenses: Not hard at all  Food Insecurity: No Food Insecurity (09/29/2022)   Hunger Vital Sign    Worried About Running Out of Food in the Last Year: Never true    Ran Out of Food in the Last Year: Never true  Transportation Needs: No Transportation Needs (10/04/2022)   PRAPARE - Administrator, Civil Service (Medical): No    Lack of Transportation (Non-Medical): No  Physical Activity: Sufficiently Active (07/24/2021)   Exercise Vital Sign    Days of Exercise per Week: 5 days    Minutes of Exercise per Session: 30 min  Stress: No Stress Concern Present (07/24/2021)   [employer] of Occupational Health - Occupational Stress Questionnaire    Feeling of Stress : Not at all  Social Connections: Socially Integrated (07/24/2021)   Social Connection and Isolation Panel [NHANES]    Frequency of Communication with Friends and Family: More than three times a week    Frequency of Social Gatherings with Friends and Family: Three times a week    Attends Religious Services: 1 to 4 times per year    Active Member of Clubs or Organizations: No    Attends Banker Meetings: 1 to 4 times per year    Marital Status: Married   Catering manager Violence: Not At Risk (09/29/2022)   Humiliation, Afraid, Rape, and Kick questionnaire    Fear of Current or Ex-Partner: No    Emotionally Abused: No    Physically Abused: No    Sexually Abused: No      Review of Systems  All other systems reviewed and are negative.      Objective:   Physical Exam Vitals reviewed.  Constitutional:      General: He is not in acute distress.    Appearance: He is well-developed. He is not diaphoretic.  HENT:     Right Ear: There is impacted cerumen.     Left Ear: Tympanic membrane, ear canal and external ear normal.     Nose: Nose normal.     Mouth/Throat:     Pharynx: No oropharyngeal exudate.  Eyes:     General: No scleral icterus.    Conjunctiva/sclera: Conjunctivae normal.  Cardiovascular:     Rate and Rhythm: Normal rate and regular rhythm.     Pulses:          Dorsalis pedis pulses are 2+ on the right side and 2+ on the left side.       Posterior tibial pulses are 2+ on the right side and 2+ on the left side.     Heart sounds: Normal heart sounds.  Pulmonary:     Effort: Pulmonary effort is normal. No respiratory distress.     Breath sounds: Normal breath sounds. No wheezing or rales.  Abdominal:     General: Bowel sounds are normal. There is no distension.     Palpations: Abdomen is soft.     Tenderness: There is abdominal tenderness. There is guarding. There  is no rebound.     Hernia: There is no hernia in the left inguinal area.  Genitourinary:    Penis: Normal.      Testes: Normal.        Right: Mass or tenderness not present.        Left: Mass or tenderness not present.  Musculoskeletal:     Cervical back: Neck supple.     Right foot: Normal range of motion. No deformity or bunion.     Left foot: Normal range of motion. No deformity or bunion.  Lymphadenopathy:     Cervical: No cervical adenopathy.           Assessment & Plan:  Essential hypertension  Diverticulitis  Impacted cerumen of  right ear Patient has diverticulitis.  Begin MiraLAX 1-2 times daily to reduce constipation and start Augmentin 875 mg twice daily for 10 days.  His blood pressure is too high.  Increase hydralazine to 50 mg 3 times a day.  Cerumen impaction was removed using a combination of irrigation and lavage as well as manual disimpaction with curette

## 2024-02-14 ENCOUNTER — Other Ambulatory Visit: Payer: PPO

## 2024-03-07 ENCOUNTER — Other Ambulatory Visit: Payer: Self-pay | Admitting: Family Medicine

## 2024-03-07 DIAGNOSIS — F411 Generalized anxiety disorder: Secondary | ICD-10-CM

## 2024-03-07 NOTE — Telephone Encounter (Signed)
 Prescription Request  03/07/2024  LOV: 01/17/2024  What is the name of the medication or equipment? ALPRAZolam  (XANAX ) 0.5 MG tablet   Have you contacted your pharmacy to request a refill? Yes   Which pharmacy would you like this sent to?  [facility] - [place], [place] - [address] [place] [address] Phone: [phone] Fax: [phone]    Patient notified that their request is being sent to the clinical staff for review and that they should receive a response within 2 business days.   Please advise at [facility] [phone]

## 2024-03-07 NOTE — Telephone Encounter (Signed)
 Prescription Request  03/07/2024  LOV: 01/17/2024  What is the name of the medication or equipment? HYDROcodone -acetaminophen  (NORCO/VICODIN) 5-325 MG tablet   Have you contacted your pharmacy to request a refill? Yes   Which pharmacy would you like this sent to?  [facility] - [place], [place] - [address] [place] [address] Phone: [phone] Fax: [phone]    Patient notified that their request is being sent to the clinical staff for review and that they should receive a response within 2 business days.   Please advise at [facility] [phone]

## 2024-03-09 MED ORDER — HYDROCODONE-ACETAMINOPHEN 5-325 MG PO TABS: 1.0000 | ORAL_TABLET | Freq: Four times a day (QID) | ORAL | 0 refills | Status: DC | PRN

## 2024-03-09 MED ORDER — ALPRAZOLAM 0.5 MG PO TABS
0.5000 mg | ORAL_TABLET | Freq: Three times a day (TID) | ORAL | 0 refills | Status: DC | PRN
Start: 2024-03-09 — End: 2024-04-10

## 2024-03-09 NOTE — Telephone Encounter (Signed)
 Requested medication (s) are due for refill today: yes  Requested medication (s) are on the active medication list: yes  Last refill:  01/09/24  Future visit scheduled: no  Notes to clinic:  Unable to refill per protocol, cannot delegate.      Requested Prescriptions  Pending Prescriptions Disp Refills   ALPRAZolam  (XANAX ) 0.5 MG tablet 30 tablet 0    Sig: Take 1 tablet (0.5 mg total) by mouth 3 (three) times daily as needed for anxiety.     Not Delegated - Psychiatry: Anxiolytics/Hypnotics 2 Failed - 03/09/2024  9:00 AM      Failed - This refill cannot be delegated      Failed - Urine Drug Screen completed in last 360 days      Failed - Valid encounter within last 6 months    Recent Outpatient Visits           1 month ago Essential hypertension   [place] [facility] Pickard, Cisco Crest, MD   5 months ago Essential hypertension   [place] [facility] Cheril Cork, Cisco Crest, MD   1 year ago ASCVD (arteriosclerotic cardiovascular disease)   [place] [facility] Austine Lefort, MD       Future Appointments             In 5 months Trent Frizzle, MD [facility] - Patient is not pregnant       HYDROcodone -acetaminophen  (NORCO/VICODIN) 5-325 MG tablet 90 tablet 0    Sig: Take 1 tablet by mouth every 6 (six) hours as needed for moderate pain (pain score 4-6).     Not Delegated - Analgesics:  Opioid Agonist Combinations Failed - 03/09/2024  9:00 AM      Failed - This refill cannot be delegated      Failed - Urine Drug Screen completed in last 360 days      Failed - Valid encounter within last 3 months    Recent Outpatient Visits           1 month ago Essential hypertension   [place] [facility] Austine Lefort, MD   5 months ago Essential hypertension   [place] [facility] Austine Lefort, MD   1 year ago ASCVD  (arteriosclerotic cardiovascular disease)   [place] [facility] Pickard, Cisco Crest, MD       Future Appointments             In 5 months Trent Frizzle, MD [facility] [place]

## 2024-03-09 NOTE — Telephone Encounter (Signed)
 Requested medication (s) are due for refill today: yes  Requested medication (s) are on the active medication list: yes  Last refill:  01/09/24  Future visit scheduled: no  Notes to clinic:  Unable to refill per protocol, cannot delegate.      Requested Prescriptions  Pending Prescriptions Disp Refills   ALPRAZolam  (XANAX ) 0.5 MG tablet 30 tablet 0    Sig: Take 1 tablet (0.5 mg total) by mouth 3 (three) times daily as needed for anxiety.     Not Delegated - Psychiatry: Anxiolytics/Hypnotics 2 Failed - 03/09/2024  8:59 AM      Failed - This refill cannot be delegated      Failed - Urine Drug Screen completed in last 360 days      Failed - Valid encounter within last 6 months    Recent Outpatient Visits           1 month ago Essential hypertension   [place] [facility] Pickard, Cisco Crest, MD   5 months ago Essential hypertension   [place] [facility] Cheril Cork, Cisco Crest, MD   1 year ago ASCVD (arteriosclerotic cardiovascular disease)   [place] [facility] Austine Lefort, MD       Future Appointments             In 5 months Trent Frizzle, MD [facility] - Patient is not pregnant       HYDROcodone -acetaminophen  (NORCO/VICODIN) 5-325 MG tablet 90 tablet 0    Sig: Take 1 tablet by mouth every 6 (six) hours as needed for moderate pain (pain score 4-6).     Not Delegated - Analgesics:  Opioid Agonist Combinations Failed - 03/09/2024  8:59 AM      Failed - This refill cannot be delegated      Failed - Urine Drug Screen completed in last 360 days      Failed - Valid encounter within last 3 months    Recent Outpatient Visits           1 month ago Essential hypertension   [place] [facility] Austine Lefort, MD   5 months ago Essential hypertension   [place] [facility] Austine Lefort, MD   1 year ago ASCVD  (arteriosclerotic cardiovascular disease)   [place] [facility] Pickard, Cisco Crest, MD       Future Appointments             In 5 months Trent Frizzle, MD [facility] [place]

## 2024-04-05 ENCOUNTER — Emergency Department (HOSPITAL_COMMUNITY)

## 2024-04-05 ENCOUNTER — Observation Stay (HOSPITAL_COMMUNITY)

## 2024-04-05 ENCOUNTER — Observation Stay (HOSPITAL_COMMUNITY)
Admit: 2024-04-05 | Discharge: 2024-04-05 | Disposition: A | Discharge status: Home / Self Care | Attending: Internal Medicine | Admitting: Internal Medicine

## 2024-04-05 ENCOUNTER — Encounter (HOSPITAL_COMMUNITY): Payer: Self-pay

## 2024-04-05 ENCOUNTER — Observation Stay (HOSPITAL_COMMUNITY)
Admission: EM | Admit: 2024-04-05 | Discharge: 2024-04-05 | Disposition: A | Discharge status: Home / Self Care | Attending: Internal Medicine | Admitting: Internal Medicine

## 2024-04-05 ENCOUNTER — Other Ambulatory Visit: Payer: Self-pay

## 2024-04-05 DIAGNOSIS — Z7901 Long term (current) use of anticoagulants: Secondary | ICD-10-CM | POA: Insufficient documentation

## 2024-04-05 DIAGNOSIS — F1090 Alcohol use, unspecified, uncomplicated: Secondary | ICD-10-CM | POA: Insufficient documentation

## 2024-04-05 DIAGNOSIS — K219 Gastro-esophageal reflux disease without esophagitis: Secondary | ICD-10-CM | POA: Insufficient documentation

## 2024-04-05 DIAGNOSIS — M109 Gout, unspecified: Secondary | ICD-10-CM | POA: Diagnosis not present

## 2024-04-05 DIAGNOSIS — R55 Syncope and collapse: Secondary | ICD-10-CM

## 2024-04-05 DIAGNOSIS — I10 Essential (primary) hypertension: Secondary | ICD-10-CM | POA: Diagnosis not present

## 2024-04-05 DIAGNOSIS — N1832 Chronic kidney disease, stage 3b: Secondary | ICD-10-CM | POA: Insufficient documentation

## 2024-04-05 DIAGNOSIS — R569 Unspecified convulsions: Secondary | ICD-10-CM

## 2024-04-05 DIAGNOSIS — I129 Hypertensive chronic kidney disease with stage 1 through stage 4 chronic kidney disease, or unspecified chronic kidney disease: Secondary | ICD-10-CM | POA: Insufficient documentation

## 2024-04-05 DIAGNOSIS — R079 Chest pain, unspecified: Secondary | ICD-10-CM | POA: Diagnosis not present

## 2024-04-05 DIAGNOSIS — J42 Unspecified chronic bronchitis: Secondary | ICD-10-CM | POA: Diagnosis not present

## 2024-04-05 DIAGNOSIS — E785 Hyperlipidemia, unspecified: Secondary | ICD-10-CM | POA: Diagnosis not present

## 2024-04-05 DIAGNOSIS — Z8546 Personal history of malignant neoplasm of prostate: Secondary | ICD-10-CM | POA: Insufficient documentation

## 2024-04-05 DIAGNOSIS — Z79899 Other long term (current) drug therapy: Secondary | ICD-10-CM | POA: Insufficient documentation

## 2024-04-05 DIAGNOSIS — E782 Mixed hyperlipidemia: Secondary | ICD-10-CM | POA: Diagnosis not present

## 2024-04-05 DIAGNOSIS — I2510 Atherosclerotic heart disease of native coronary artery without angina pectoris: Secondary | ICD-10-CM | POA: Insufficient documentation

## 2024-04-05 DIAGNOSIS — N1831 Chronic kidney disease, stage 3a: Secondary | ICD-10-CM

## 2024-04-05 DIAGNOSIS — Z9861 Coronary angioplasty status: Secondary | ICD-10-CM | POA: Diagnosis not present

## 2024-04-05 DIAGNOSIS — T17920A Food in respiratory tract, part unspecified causing asphyxiation, initial encounter: Secondary | ICD-10-CM | POA: Diagnosis not present

## 2024-04-05 DIAGNOSIS — Z87891 Personal history of nicotine dependence: Secondary | ICD-10-CM | POA: Diagnosis not present

## 2024-04-05 DIAGNOSIS — R531 Weakness: Secondary | ICD-10-CM | POA: Diagnosis not present

## 2024-04-05 DIAGNOSIS — I6523 Occlusion and stenosis of bilateral carotid arteries: Secondary | ICD-10-CM | POA: Diagnosis not present

## 2024-04-05 DIAGNOSIS — R231 Pallor: Secondary | ICD-10-CM | POA: Diagnosis not present

## 2024-04-05 DIAGNOSIS — N179 Acute kidney failure, unspecified: Secondary | ICD-10-CM | POA: Diagnosis not present

## 2024-04-05 DIAGNOSIS — I700 Atherosclerosis of aorta: Secondary | ICD-10-CM | POA: Diagnosis not present

## 2024-04-05 LAB — COMPREHENSIVE METABOLIC PANEL WITH GFR
ALT: 19 U/L (ref 0–44)
AST: 23 U/L (ref 15–41)
Albumin: 4.1 g/dL (ref 3.5–5.0)
Alkaline Phosphatase: 70 U/L (ref 38–126)
Anion gap: 12 (ref 5–15)
BUN: 13 mg/dL (ref 8–23)
CO2: 22 mmol/L (ref 22–32)
Calcium: 9.5 mg/dL (ref 8.9–10.3)
Chloride: 105 mmol/L (ref 98–111)
Creatinine, Ser: 1.85 mg/dL — ABNORMAL HIGH (ref 0.61–1.24)
GFR, Estimated: 40 mL/min — ABNORMAL LOW (ref >60)
Glucose, Bld: 92 mg/dL (ref 70–99)
Potassium: 4.1 mmol/L (ref 3.5–5.1)
Sodium: 139 mmol/L (ref 135–145)
Total Bilirubin: 1 mg/dL (ref 0.0–1.2)
Total Protein: 7.3 g/dL (ref 6.5–8.1)

## 2024-04-05 LAB — ECHOCARDIOGRAM COMPLETE
AR max vel: 2.53 cm2
AV Area VTI: 2.43 cm2
AV Area mean vel: 2.3 cm2
AV Mean grad: 5.7 mmHg
AV Peak grad: 10.6 mmHg
Ao pk vel: 1.63 m/s
Area-P 1/2: 3.48 cm2
Height: 72 in
MV M vel: 2.13 m/s
MV Peak grad: 18.1 mmHg
S' Lateral: 2.5 cm
Weight: 3347.46 [oz_av]

## 2024-04-05 LAB — CBC WITH DIFFERENTIAL/PLATELET
Abs Immature Granulocytes: 0.06 10*3/uL (ref 0.00–0.07)
Basophils Absolute: 0.1 10*3/uL (ref 0.0–0.1)
Basophils Relative: 1 %
Eosinophils Absolute: 0 10*3/uL (ref 0.0–0.5)
Eosinophils Relative: 0 %
HCT: 46.8 % (ref 39.0–52.0)
Hemoglobin: 16.4 g/dL (ref 13.0–17.0)
Immature Granulocytes: 1 %
Lymphocytes Relative: 13 %
Lymphs Abs: 1.2 10*3/uL (ref 0.7–4.0)
MCH: 31.9 pg (ref 26.0–34.0)
MCHC: 35 g/dL (ref 30.0–36.0)
MCV: 91.1 fL (ref 80.0–100.0)
Monocytes Absolute: 0.7 10*3/uL (ref 0.1–1.0)
Monocytes Relative: 7 %
Neutro Abs: 7.4 10*3/uL (ref 1.7–7.7)
Neutrophils Relative %: 78 %
Platelets: 220 10*3/uL (ref 150–400)
RBC: 5.14 MIL/uL (ref 4.22–5.81)
RDW: 12.9 % (ref 11.5–15.5)
WBC: 9.4 10*3/uL (ref 4.0–10.5)
nRBC: 0 % (ref 0.0–0.2)

## 2024-04-05 LAB — CK: Total CK: 126 U/L (ref 49–397)

## 2024-04-05 LAB — TROPONIN I (HIGH SENSITIVITY)
Troponin I (High Sensitivity): 9 ng/L (ref <18)
Troponin I (High Sensitivity): 9 ng/L (ref <18)

## 2024-04-05 LAB — MAGNESIUM: Magnesium: 1.7 mg/dL (ref 1.7–2.4)

## 2024-04-05 MED ORDER — ATORVASTATIN CALCIUM 40 MG PO TABS
80.0000 mg | ORAL_TABLET | Freq: Every day | ORAL | Status: DC
  Administered 2024-04-05: 80 mg via ORAL
  Filled 2024-04-05: qty 2

## 2024-04-05 MED ORDER — LISINOPRIL 10 MG PO TABS: 20.0000 mg | ORAL_TABLET | Freq: Every day | ORAL | Status: DC

## 2024-04-05 MED ORDER — CARVEDILOL 12.5 MG PO TABS: 12.5000 mg | ORAL_TABLET | Freq: Two times a day (BID) | ORAL | Status: DC

## 2024-04-05 MED ORDER — EZETIMIBE 10 MG PO TABS
10.0000 mg | ORAL_TABLET | Freq: Every day | ORAL | Status: DC
  Administered 2024-04-05: 10 mg via ORAL
  Filled 2024-04-05: qty 1

## 2024-04-05 MED ORDER — ONDANSETRON HCL 4 MG PO TABS
4.0000 mg | ORAL_TABLET | Freq: Four times a day (QID) | ORAL | Status: DC | PRN
Start: 2024-04-05 — End: 2024-04-05

## 2024-04-05 MED ORDER — PANTOPRAZOLE SODIUM 40 MG IV SOLR
40.0000 mg | INTRAVENOUS | Status: DC
  Administered 2024-04-05: 40 mg via INTRAVENOUS
  Filled 2024-04-05: qty 10

## 2024-04-05 MED ORDER — ALLOPURINOL 100 MG PO TABS
200.0000 mg | ORAL_TABLET | Freq: Every day | ORAL | Status: DC
  Administered 2024-04-05: 200 mg via ORAL

## 2024-04-05 MED ORDER — ONDANSETRON HCL 4 MG/2ML IJ SOLN: 4.0000 mg | Freq: Four times a day (QID) | INTRAMUSCULAR | Status: DC | PRN

## 2024-04-05 MED ORDER — LACTATED RINGERS IV SOLN: INTRAVENOUS | Status: AC

## 2024-04-05 MED ORDER — ASPIRIN 81 MG PO TBEC
81.0000 mg | DELAYED_RELEASE_TABLET | Freq: Every day | ORAL | Status: DC
  Administered 2024-04-05: 81 mg via ORAL
  Filled 2024-04-05: qty 1

## 2024-04-05 MED ORDER — ACETAMINOPHEN 325 MG PO TABS
650.0000 mg | ORAL_TABLET | Freq: Once | ORAL | Status: AC
  Administered 2024-04-05: 650 mg via ORAL
  Filled 2024-04-05: qty 2

## 2024-04-05 MED ORDER — PANTOPRAZOLE SODIUM 40 MG PO TBEC
40.0000 mg | DELAYED_RELEASE_TABLET | Freq: Two times a day (BID) | ORAL | Status: DC
  Administered 2024-04-05: 40 mg via ORAL
  Filled 2024-04-05: qty 1

## 2024-04-05 MED ORDER — ENOXAPARIN SODIUM 40 MG/0.4ML IJ SOSY: 40.0000 mg | PREFILLED_SYRINGE | INTRAMUSCULAR | Status: DC

## 2024-04-05 MED ORDER — ACETAMINOPHEN 650 MG RE SUPP
650.0000 mg | Freq: Four times a day (QID) | RECTAL | Status: DC | PRN
Start: 2024-04-05 — End: 2024-04-05

## 2024-04-05 MED ORDER — SODIUM CHLORIDE 0.9 % IV BOLUS
1000.0000 mL | Freq: Once | INTRAVENOUS | Status: AC
  Administered 2024-04-05: 1000 mL via INTRAVENOUS

## 2024-04-05 MED ORDER — ACETAMINOPHEN 325 MG PO TABS: 650.0000 mg | ORAL_TABLET | Freq: Four times a day (QID) | ORAL | Status: DC | PRN

## 2024-04-05 MED ORDER — FENTANYL CITRATE PF 50 MCG/ML IJ SOSY
50.0000 ug | PREFILLED_SYRINGE | Freq: Once | INTRAMUSCULAR | Status: AC
  Administered 2024-04-05: 50 ug via INTRAVENOUS
  Filled 2024-04-05: qty 1

## 2024-04-05 MED ORDER — HYDRALAZINE HCL 25 MG PO TABS
50.0000 mg | ORAL_TABLET | Freq: Three times a day (TID) | ORAL | Status: DC
  Administered 2024-04-05 (×2): 50 mg via ORAL
  Filled 2024-04-05 (×2): qty 2

## 2024-04-05 NOTE — Progress Notes (Signed)
EEG complete, results pending 

## 2024-04-05 NOTE — ED Provider Notes (Signed)
 [facility] AT [facility] Provider Note   CSN: [id number] Arrival date & time: 04/05/24  0026     History  Chief Complaint  Patient presents with   Loss of Consciousness    Brandon Buck is a 63 y.o. male.  The history is provided by the patient and the spouse.  Patient with extensive history including CKD, coronary artery disease, hypertension presents with syncopal episode.  Patient reports he was working out in the yard in the heat all day today.  While outside he started feeling lightheaded and nauseous, he came inside and had sharp left-sided chest pain, he took nitroglycerin  and he had a syncopal episode while sitting in the chair.  No seizures.  He reports he was already feeling like he might pass out prior to the nitroglycerin .  He reports he had an episode of syncope about a week ago while he was talking on the phone. He did not have any traumatic injuries tonight from the syncope.  Reports multiple mosquito bites to his legs from mowing the grass in shorts  No chest pain at this time, but is reporting overall fatigue. No focal weakness   Past Medical History:  Diagnosis Date   Anxiety    Arthritis    Asthma    Cancer (HCC)    right renal cancer, right radical nephrectomy 2004   Chronic back pain    Chronic hip pain, left    CKD (chronic kidney disease) stage 3, GFR 30-59 ml/min (HCC)    ckd stage 3 b    Complication of anesthesia    1 seizure after anesthesia 1999 or 2000 none since , recent surgeries no seizures   Coronary artery disease    a. s/p prior PCI to RCA and LCx in 2009   Depression    GERD (gastroesophageal reflux disease)    Gout    no recent flares   Heart disease    High blood pressure    History of PTCA 2 2004   2 stents   History of stomach ulcers    Hypercholesteremia    Hypertension    Kidney disease    Myocardial infarction (HCC) 2004   Prostate cancer (HCC)    3/12 cores upt to 40% grade 2 cancer.  Planning brachytherapy (2021)   Proteinuria    Renal insufficiency    Tennis elbow     Home Medications Prior to Admission medications   Medication Sig Start Date End Date Taking? Authorizing Provider  allopurinol  (ZYLOPRIM ) 100 MG tablet Take 2 tablets (200 mg total) by mouth daily. 02/10/23   Austine Lefort, MD  ALPRAZolam  (XANAX ) 0.5 MG tablet Take 1 tablet (0.5 mg total) by mouth 3 (three) times daily as needed for anxiety. 03/09/24   Austine Lefort, MD  amoxicillin -clavulanate (AUGMENTIN ) 875-125 MG tablet Take 1 tablet by mouth 2 (two) times daily. 01/17/24   Austine Lefort, MD  aspirin  81 MG tablet Take 81 mg by mouth daily.    [provider]  atorvastatin  (LIPITOR) 80 MG tablet Take 1 tablet (80 mg total) by mouth daily. 10/10/23   Austine Lefort, MD  carvedilol  (COREG ) 12.5 MG tablet Take 1 tablet (12.5 mg total) by mouth 2 (two) times daily. (AM & PM) 02/25/23   Mallipeddi, Vishnu P, MD  ezetimibe  (ZETIA ) 10 MG tablet Take 1 tablet (10 mg total) by mouth daily. 10/10/23   Austine Lefort, MD  furosemide  (LASIX ) 20 MG tablet Take  20 mg by mouth daily as needed for edema or fluid.    [provider]  gabapentin  (NEURONTIN ) 300 MG capsule Take 1 capsule (300 mg total) by mouth at bedtime. 10/10/23   Austine Lefort, MD  hydrALAZINE  (APRESOLINE ) 25 MG tablet Take 1 tablet (25 mg total) by mouth 3 (three) times daily. (AM, NOON, PM) 02/25/23 02/20/24  Mallipeddi, Vishnu P, MD  hydrALAZINE  (APRESOLINE ) 50 MG tablet Take 1 tablet (50 mg total) by mouth 3 (three) times daily. 01/17/24   Austine Lefort, MD  HYDROcodone -acetaminophen  (NORCO/VICODIN) 5-325 MG tablet Take 1 tablet by mouth every 6 (six) hours as needed for moderate pain (pain score 4-6). 12/13/23   Austine Lefort, MD  HYDROcodone -acetaminophen  (NORCO/VICODIN) 5-325 MG tablet Take 1 tablet by mouth every 6 (six) hours as needed for moderate pain (pain score 4-6). 03/09/24   Austine Lefort, MD   lisinopril  (ZESTRIL ) 20 MG tablet Take 20 mg by mouth daily. (Dinner time)    [provider]  NICOTROL  10 MG inhaler INHALE 1 CARTRIDGE INTO THE LUNGS AS NEEDED FOR SMOKING CESSATION. 03/23/23   Austine Lefort, MD  NIFEdipine  (PROCARDIA  XL/NIFEDICAL-XL) 90 MG 24 hr tablet Take 1 tablet (90 mg total) by mouth at bedtime. 02/25/23   Mallipeddi, Vishnu P, MD  nitroGLYCERIN  (NITROSTAT ) 0.4 MG SL tablet Place 1 tablet (0.4 mg total) under the tongue every 5 (five) minutes as needed. Do not take within 2 days of viagra . 09/30/22   [facility], Warren Haber, PA  pantoprazole  (PROTONIX ) 40 MG tablet Take 1 tablet (40 mg total) by mouth 2 (two) times daily. 01/09/24   Austine Lefort, MD      Allergies    Other and Keflex  [cephalexin ]    Review of Systems   Review of Systems  Constitutional:  Positive for fatigue. Negative for fever.  Cardiovascular:  Positive for chest pain.  Neurological:  Positive for syncope, light-headedness and headaches.    Physical Exam Updated Vital Signs BP (!) 150/91   Pulse 78   Temp 97.9 F (36.6 C) (Oral)   Resp 14   Ht 1.829 m (6')   Wt 95.3 kg   SpO2 97%   BMI 28.48 kg/m  Physical Exam CONSTITUTIONAL: Elderly and chronically ill-appearing HEAD: Normocephalic/atraumatic, no visible trauma EYES: EOMI/PERRL ENMT: Mucous membranes moist NECK: supple no meningeal signs SPINE/BACK:entire spine nontender No bruising/crepitance/stepoffs noted to spine CV: S1/S2 noted, no murmurs/rubs/gallops noted LUNGS: Lungs are clear to auscultation bilaterally, no apparent distress ABDOMEN: soft, nontender NEURO: Pt is awake/alert/appropriate, moves all extremitiesx4.  No facial droop.   EXTREMITIES: pulses normal/equalx4, full ROM, no deformities SKIN: warm, color normal, multiple pinpoint lesions on his lower extremities that he reports are mosquito bites No signs of cellulitis PSYCH: no abnormalities of mood noted, alert and oriented to situation  ED Results  / Procedures / Treatments   Labs (all labs ordered are listed, but only abnormal results are displayed) Labs Reviewed  COMPREHENSIVE METABOLIC PANEL WITH GFR - Abnormal; Notable for the following components:      Result Value   Creatinine, Ser 1.85 (*)    GFR, Estimated 40 (*)    All other components within normal limits  CBC WITH DIFFERENTIAL/PLATELET  CK  MAGNESIUM  TROPONIN I (HIGH SENSITIVITY)  TROPONIN I (HIGH SENSITIVITY)    EKG EKG Interpretation Date/Time:  Thursday April 05 2024 00:48:03 EDT Ventricular Rate:  81 PR Interval:  141 QRS Duration:  79 QT Interval:  368 QTC Calculation:  428 R Axis:   34  Text Interpretation: Sinus rhythm Probable left atrial enlargement Abnormal R-wave progression, early transition Nonspecific repol abnormality, diffuse leads Confirmed by Eldon Greenland ([id number]) on 04/05/2024 2:20:50 AM  Radiology DG Chest Portable 1 View Result Date: 04/05/2024 CLINICAL DATA:  Chest pain weakness EXAM: PORTABLE CHEST 1 VIEW COMPARISON:  09/28/2022 FINDINGS: Stable cardiomediastinal silhouette. Aortic atherosclerotic calcification. Chronic bronchitic changes. Bibasilar atelectasis/scarring. No pleural effusion or pneumothorax. No displaced rib fractures. IMPRESSION: Chronic bronchitic changes. No acute cardiopulmonary process. Electronically Signed   By: Rozell Cornet M.D.   On: 04/05/2024 02:00    Procedures Procedures    Medications Ordered in ED Medications  fentaNYL  (SUBLIMAZE ) injection 50 mcg (has no administration in time range)  sodium chloride  0.9 % bolus 1,000 mL (1,000 mLs Intravenous New Bag/Given 04/05/24 0228)  acetaminophen  (TYLENOL ) tablet 650 mg (650 mg Oral Given 04/05/24 0226)    ED Course/ Medical Decision Making/ A&P Clinical Course as of 04/05/24 0409  Thu Apr 05, 2024  0237 Creatinine(!): 1.85 Renal insufficiency [DW]  0241 Patient presents after having a syncopal episode while working outside.  He also had associated chest  pain.  This could have been exacerbated by the nitroglycerin  but he was already feeling poorly before this.  Also reports he had a syncopal episode last week while talking on the phone.  Patient will need to be admitted.  He has no chest pain at this time.  Initial troponin negative.  Low suspicion for PE or aortic dissection at this time [DW]  0408 Patient overall stable at this time.  No new chest pain.  He is awake and alert, vitals are appropriate.  Due to multiple syncopal episodes, he will be admitted for evaluation and observation and may need echocardiogram.. He is high risk for serious cardiac event due to his underlying comorbidities [DW]  0408 Discussed with Dr. Adefeso for admission [DW]    Clinical Course User Index [DW] Eldon Greenland, MD                                 Medical Decision Making Amount and/or Complexity of Data Reviewed Labs: ordered. Decision-making details documented in ED Course. Radiology: ordered. ECG/medicine tests: ordered.  Risk OTC drugs. Prescription drug management. Decision regarding hospitalization.   This patient presents to the ED for concern of syncope, this involves an extensive number of treatment options, and is a complaint that carries with it a high risk of complications and morbidity.  The differential diagnosis includes but is not limited to orthostatic hypotension, medication induced syncope, cardiac arrhythmia, seizure  Comorbidities that complicate the patient evaluation: Patient's presentation is complicated by their history of CAD  Social Determinants of Health: Patient's previous smoking history and poor health literacy  increases the complexity of managing their presentation  Additional history obtained: Additional history obtained from spouse Records reviewed cardiac cath results reviewed  Lab Tests: I Ordered, and personally interpreted labs.  The pertinent results include: Renal insufficiency  Imaging Studies  ordered: I ordered imaging studies including X-ray chest  I independently visualized and interpreted imaging which showed no acute findings I agree with the radiologist interpretation  Cardiac Monitoring: The patient was maintained on a cardiac monitor.  I personally viewed and interpreted the cardiac monitor which showed an underlying rhythm of:  sinus rhythm  Medicines ordered and prescription drug management: I ordered medication including: Tylenol  for pain/headache Reevaluation of the patient  after these medicines showed that the patient    stayed the same    Critical Interventions:   IV fluids, admission for observation  Consultations Obtained: I requested consultation with the admitting physician [facility], and discussed  findings as well as pertinent plan - they recommend: Admit  Reevaluation: After the interventions noted above, I reevaluated the patient and found that they have :improved  Complexity of problems addressed: Patient's presentation is most consistent with  acute presentation with potential threat to life or bodily function  Disposition: After consideration of the diagnostic results and the patient's response to treatment,  I feel that the patent would benefit from admission  .           Final Clinical Impression(s) / ED Diagnoses Final diagnoses:  Syncope and collapse    Rx / DC Orders ED Discharge Orders     None         Eldon Greenland, MD 04/05/24 [phone]

## 2024-04-05 NOTE — Consult Note (Addendum)
 Cardiology Consultation   Patient ID: Brandon Buck MRN: [id number]; DOB: 02/18/1961  Admit date: 04/05/2024 Date of Consult: 04/05/2024  PCP:  Austine Lefort, MD   [facility] Providers Cardiologist:  Lasalle Pointer, MD        Patient Profile: Brandon Buck is a 63 y.o. male with a hx of  CAD with prior PCI to RCA and LCX in 2009, HTN, HLD, COPD, prostate cancer, CKD III who is being seen 04/05/2024 for the evaluation of syncope at the request of Dr Tat.  History of Present Illness: [name] 63 yo male history of CAD with prior PCI to RCA and LCX in 2009, HTN, HLD, COPD, prostate cancer, CKD III, admitted with syncope.  Was outside working fairly consistently in the hot sun for about 11 hours yesterday. Throughout the day some intermittent lightheadedness, dizziness, nausea but kept on working. Went inside toward the end of the day feeling very lightheaded and nauseous. Developed some left sided chest pain which is chronic though infrequent for him. Took a NG and shortly after he passed out. Reports a similar episode of syncope last week also after spending 10+ hours outdoors in the heat.    WBC 9.4 Hgb 16.4 Plt 220 K 4.1 Cr 1.85 BUN 13 Mg 1.7  Trop 9-->9 EKG SR, chronic ST/T changes CXR no acute process Carotid US : mild plaque Echo pending  09/2022 cath: patent stents, mild nonobstructive disease. 09/2022 echo: LVE 60-65%, no WMAs      Past Medical History:  Diagnosis Date   Anxiety    Arthritis    Asthma    Cancer (HCC)    right renal cancer, right radical nephrectomy 2004   Chronic back pain    Chronic hip pain, left    CKD (chronic kidney disease) stage 3, GFR 30-59 ml/min (HCC)    ckd stage 3 b    Complication of anesthesia    1 seizure after anesthesia 1999 or 2000 none since , recent surgeries no seizures   Coronary artery disease    a. s/p prior PCI to RCA and LCx in 2009   Depression    GERD (gastroesophageal reflux disease)     Gout    no recent flares   Heart disease    High blood pressure    History of PTCA 2 2004   2 stents   History of stomach ulcers    Hypercholesteremia    Hypertension    Kidney disease    Myocardial infarction (HCC) 2004   Prostate cancer (HCC)    3/12 cores upt to 40% grade 2 cancer. Planning brachytherapy (2021)   Proteinuria    Renal insufficiency    Tennis elbow     Past Surgical History:  Procedure Laterality Date   COLONOSCOPY WITH PROPOFOL  N/A 10/28/2020   Procedure: COLONOSCOPY WITH PROPOFOL ;  Surgeon: Urban Garden, MD;  Location: [facility];  Service: Gastroenterology;  Laterality: N/A;  2:30   coloscopy  10/28/2020   removed 7 polyps done at [place]   CYSTOSCOPY N/A 11/03/2020   Procedure: CYSTOSCOPY;  Surgeon: Trent Frizzle, MD;  Location: [facility];  Service: Urology;  Laterality: N/A;   Heart catherization  2004   2 stents to heart cypher left circulflex   HERNIA REPAIR  1999   left groin bilateral   HIP SURGERY Left 1999 or 2000   bond graft   Kidney removed Right 2003 or 2004   for cancer  LEFT HEART CATH AND CORONARY ANGIOGRAPHY N/A 09/29/2022   Procedure: LEFT HEART CATH AND CORONARY ANGIOGRAPHY;  Surgeon: Lucendia Rusk, MD;  Location: [facility] INVASIVE CV LAB;  Service: Cardiovascular;  Laterality: N/A;   POLYPECTOMY  10/28/2020   Procedure: POLYPECTOMY INTESTINAL;  Surgeon: Urban Garden, MD;  Location: [facility];  Service: Gastroenterology;;   PROSTATE BIOPSY  2021   RADIOACTIVE SEED IMPLANT N/A 11/03/2020   Procedure: RADIOACTIVE SEED IMPLANT/BRACHYTHERAPY IMPLANT;  Surgeon: Trent Frizzle, MD;  Location: [facility];  Service: Urology;  Laterality: N/A;  90 MINS   SPACE OAR INSTILLATION N/A 11/03/2020   Procedure: SPACE OAR INSTILLATION;  Surgeon: Trent Frizzle, MD;  Location: [facility];  Service: Urology;  Laterality: N/A;      Scheduled Meds:  allopurinol    200 mg Oral Daily   aspirin  EC  81 mg Oral Daily   atorvastatin   80 mg Oral Daily   carvedilol   12.5 mg Oral BID   enoxaparin (LOVENOX) injection  40 mg Subcutaneous Q24H   ezetimibe   10 mg Oral Daily   hydrALAZINE   50 mg Oral TID   pantoprazole   40 mg Oral BID   Continuous Infusions:  lactated ringers      PRN Meds: acetaminophen  **OR** acetaminophen , ondansetron  **OR** ondansetron  (ZOFRAN ) IV  Allergies:    Allergies  Allergen Reactions   Other     Pt. Reports having seizures after anesthesia once 1999 or 2000 none since   Keflex  [Cephalexin ] Rash    Social History:   Social History   Socioeconomic History   Marital status: Married    Spouse name: Sheree   Number of children: 0   Years of education: Not on file   Highest education level: Not on file  Occupational History   Occupation: retired  Tobacco Use   Smoking status: Former    Current packs/day: 0.00    Average packs/day: 1.5 packs/day for 32.0 years (48.0 ttl pk-yrs)    Types: Cigarettes    Start date: 01/26/1971    Quit date: 01/26/2003    Years since quitting: 21.2   Smokeless tobacco: Never  Vaping Use   Vaping status: Never Used  Substance and Sexual Activity   Alcohol use: Yes    Alcohol/week: 0.0 standard drinks of alcohol    Comment: occ 2 week   Drug use: No   Sexual activity: Yes  Other Topics Concern   Not on file  Social History Narrative   Worked as an Personnel officer.    Married x 45 years in 2022.   Social Drivers of Corporate investment banker Strain: Low Risk  (10/04/2022)   Overall Financial Resource Strain (CARDIA)    Difficulty of Paying Living Expenses: Not hard at all  Food Insecurity: No Food Insecurity (09/29/2022)   Hunger Vital Sign    Worried About Running Out of Food in the Last Year: Never true    Ran Out of Food in the Last Year: Never true  Transportation Needs: No Transportation Needs (10/04/2022)   PRAPARE - Administrator, Civil Service (Medical): No     Lack of Transportation (Non-Medical): No  Physical Activity: Sufficiently Active (07/24/2021)   Exercise Vital Sign    Days of Exercise per Week: 5 days    Minutes of Exercise per Session: 30 min  Stress: No Stress Concern Present (07/24/2021)   [employer] of Occupational Health - Occupational Stress Questionnaire    Feeling of Stress : Not at  all  Social Connections: Socially Integrated (07/24/2021)   Social Connection and Isolation Panel    Frequency of Communication with Friends and Family: More than three times a week    Frequency of Social Gatherings with Friends and Family: Three times a week    Attends Religious Services: 1 to 4 times per year    Active Member of Clubs or Organizations: No    Attends Banker Meetings: 1 to 4 times per year    Marital Status: Married  Catering manager Violence: Not At Risk (09/29/2022)   Humiliation, Afraid, Rape, and Kick questionnaire    Fear of Current or Ex-Partner: No    Emotionally Abused: No    Physically Abused: No    Sexually Abused: No    Family History:    Family History  Problem Relation Age of Onset   Heart disease Other    Cancer Other    Kidney disease Other    Heart disease Father    Prostate cancer Father    Heart disease Mother    Lung cancer Mother    Kidney cancer Mother    Prostate cancer Maternal Uncle    Bladder Cancer Maternal Grandfather    Breast cancer Neg Hx    Colon cancer Neg Hx    Pancreatic cancer Neg Hx      ROS:  Please see the history of present illness.   All other ROS reviewed and negative.     Physical Exam/Data: Vitals:   04/05/24 0600 04/05/24 0630 04/05/24 0730 04/05/24 0753  BP: (!) 143/82 (!) 154/86 (!) 147/77 (!) 160/92  Pulse: 64 (!) 54 (!) 53 (!) 54  Resp: 15 10 12 12   Temp:    97.7 F (36.5 C)  TempSrc:    Oral  SpO2: 96% 99% 99% 100%  Weight:    94.9 kg  Height:    6' (1.829 m)    Intake/Output Summary (Last 24 hours) at 04/05/2024 1032 Last data filed  at 04/05/2024 1191 Gross per 24 hour  Intake 1000 ml  Output --  Net 1000 ml      04/05/2024    7:53 AM 04/05/2024   12:45 AM 01/17/2024    9:44 AM  Last 3 Weights  Weight (lbs) 209 lb 3.5 oz 210 lb 210 lb  Weight (kg) 94.9 kg 95.255 kg 95.255 kg     Body mass index is 28.37 kg/m.  General:  Well nourished, well developed, in no acute distress HEENT: normal Neck: no JVD Vascular: No carotid bruits; Distal pulses 2+ bilaterally Cardiac:  normal S1, S2; RRR; no murmur  Lungs:  clear to auscultation bilaterally, no wheezing, rhonchi or rales  Abd: soft, nontender, no hepatomegaly  Ext: no edema Musculoskeletal:  No deformities, BUE and BLE strength normal and equal Skin: warm and dry  Neuro:  CNs 2-12 intact, no focal abnormalities noted Psych:  Normal affect     Laboratory Data: High Sensitivity Troponin:   Recent Labs  Lab 04/05/24 0044 04/05/24 0221  TROPONINIHS 9 9     Chemistry Recent Labs  Lab 04/05/24 0044  NA 139  K 4.1  CL 105  CO2 22  GLUCOSE 92  BUN 13  CREATININE 1.85*  CALCIUM  9.5  MG 1.7  GFRNONAA 40*  ANIONGAP 12    Recent Labs  Lab 04/05/24 0044  PROT 7.3  ALBUMIN 4.1  AST 23  ALT 19  ALKPHOS 70  BILITOT 1.0   Lipids No results for input(s):  CHOL, TRIG, HDL, LABVLDL, LDLCALC, CHOLHDL in the last 168 hours.  Hematology Recent Labs  Lab 04/05/24 0044  WBC 9.4  RBC 5.14  HGB 16.4  HCT 46.8  MCV 91.1  MCH 31.9  MCHC 35.0  RDW 12.9  PLT 220   Thyroid No results for input(s): TSH, FREET4 in the last 168 hours.  BNPNo results for input(s): BNP, PROBNP in the last 168 hours.  DDimer No results for input(s): DDIMER in the last 168 hours.  Radiology/Studies:  US  Carotid Bilateral Result Date: 04/05/2024 CLINICAL DATA:  Syncope while working in he had outside. Hypertension. Hyperlipidemia. Tobacco use. EXAM: BILATERAL CAROTID DUPLEX ULTRASOUND TECHNIQUE: Martina Sledge scale imaging, color Doppler and duplex ultrasound  were performed of bilateral carotid and vertebral arteries in the neck. COMPARISON:  None available FINDINGS: Criteria: Quantification of carotid stenosis is based on velocity parameters that correlate the residual internal carotid diameter with NASCET-based stenosis levels, using the diameter of the distal internal carotid lumen as the denominator for stenosis measurement. The following velocity measurements were obtained: RIGHT ICA: 111/14 cm/sec CCA: 126/11 cm/sec SYSTOLIC ICA/CCA RATIO:  0.9 ECA: 146 cm/sec LEFT ICA: 97/21 cm/sec CCA: 73/8 cm/sec SYSTOLIC ICA/CCA RATIO:  1.3 ECA: 287 cm/sec RIGHT CAROTID ARTERY: Moderate calcified plaque seen at the origin of the external carotid artery. Mild calcified plaque seen in the proximal RIGHT internal carotid artery. RIGHT VERTEBRAL ARTERY:  Antegrade flow. LEFT CAROTID ARTERY: Severe calcified plaque at the origin of the external carotid artery. Mild calcified plaque of the proximal internal carotid artery. LEFT VERTEBRAL ARTERY:  Antegrade flow. IMPRESSION: Mild calcified plaque of the proximal internal carotid arteries bilaterally with Doppler measurements consistent with less than 50% stenosis. Electronically Signed   By: Elester Grim M.D.   On: 04/05/2024 09:31   DG Chest Portable 1 View Result Date: 04/05/2024 CLINICAL DATA:  Chest pain weakness EXAM: PORTABLE CHEST 1 VIEW COMPARISON:  09/28/2022 FINDINGS: Stable cardiomediastinal silhouette. Aortic atherosclerotic calcification. Chronic bronchitic changes. Bibasilar atelectasis/scarring. No pleural effusion or pneumothorax. No displaced rib fractures. IMPRESSION: Chronic bronchitic changes. No acute cardiopulmonary process. Electronically Signed   By: Rozell Cornet M.D.   On: 04/05/2024 02:00     Assessment and Plan: 1.Syncope - likely hypovolemic from working in hot temperatures 11 hours with little oral intake, vasodilatation from NG in this setting led to hypotension and syncope.  - orthostatics  are pending. AKI likely prerenal supporting he was hypovolemic - EKG benign, telemetry is benign - echo pending. If benign no further cardiac workup is planned.    2.HTN - aggressive HTN, on multiple agents at home - home regimen coreg  12.5mg  bid, hydralazine  75mg  tid, lisinpril 20mg , nifedipine  90.  - hyperkalemia on lisinopril  40mg , has been maintained on 20mg  daily as outpatient - continhe home regimen.   3. CAD - prior PCI to RCA and LCX in 2009 - 2023 cath was benign with patent stents and no significnat obstructive disease - chronic infrequent chest pains unchanged, no objective evidence of ischemia by EKG or enzymes. EKG with chronic ST/T changes - no plans for ischemic testing at this time, f/u echo.   Addendum 442 PM: Echo is benign, no additional cardiac testing is planned. We will sign off inpatient care. We will arrange outpatient f/u.      For questions or updates, please contact [facility] Please consult [email or web address] for contact info under    Signed, Armida Lander, MD  04/05/2024 10:32 AM

## 2024-04-05 NOTE — H&P (Addendum)
 History and Physical    Patient: Brandon Buck ION:[id number] DOB: 1961-01-14 DOA: 04/05/2024 DOS: the patient was seen and examined on 04/05/2024 PCP: Austine Lefort, MD  Patient coming from: Home  Chief Complaint:  Chief Complaint  Patient presents with   Loss of Consciousness   HPI: Brandon Buck is a 63 y.o. male with medical history significant of hypertension, hyperlipidemia, CAD, prostate cancer, GERD, gout who presents to the emergency department from home due to syncopal episode.  Patient states that he spent some time outside in the heat due to someone that was walking on his vehicle.  He felt lightheaded and nauseous while outside, so he decided to go inside, but started to complain of sharp left-sided chest pain, so he took nitroglycerin  and passed out while sitting on a recliner.  Patient states that he also had a syncopal episode while talking on the phone about a week ago.  He denies any injury, but endorsed multiple mosquito bites to his legs while mowing the grass in shorts.  ED Course:  In the emergency department, he was hemodynamically stable.  Workup in the ED showed normal CBC and BMP except for creatinine of 1.85 (baseline creatinine at 1.4).  Troponin x 2 was flat at 9, total CK1 26, magnesium 1.7 Chest x-ray showed chronic bronchitic changes.  No acute cardiopulmonary process He was treated with IV fentanyl  50 mcg x 1, IV NS 1 L was provided.  TRH was asked to admit patient.  Review of Systems: Review of systems as noted in the HPI. All other systems reviewed and are negative.   Past Medical History:  Diagnosis Date   Anxiety    Arthritis    Asthma    Cancer (HCC)    right renal cancer, right radical nephrectomy 2004   Chronic back pain    Chronic hip pain, left    CKD (chronic kidney disease) stage 3, GFR 30-59 ml/min (HCC)    ckd stage 3 b    Complication of anesthesia    1 seizure after anesthesia 1999 or 2000 none since , recent surgeries no  seizures   Coronary artery disease    a. s/p prior PCI to RCA and LCx in 2009   Depression    GERD (gastroesophageal reflux disease)    Gout    no recent flares   Heart disease    High blood pressure    History of PTCA 2 2004   2 stents   History of stomach ulcers    Hypercholesteremia    Hypertension    Kidney disease    Myocardial infarction (HCC) 2004   Prostate cancer (HCC)    3/12 cores upt to 40% grade 2 cancer. Planning brachytherapy (2021)   Proteinuria    Renal insufficiency    Tennis elbow    Past Surgical History:  Procedure Laterality Date   COLONOSCOPY WITH PROPOFOL  N/A 10/28/2020   Procedure: COLONOSCOPY WITH PROPOFOL ;  Surgeon: Urban Garden, MD;  [facility];  Service: Gastroenterology;  Laterality: N/A;  2:30   coloscopy  10/28/2020   removed 7 polyps done at [facility]   CYSTOSCOPY N/A 11/03/2020   Procedure: CYSTOSCOPY;  Surgeon: Trent Frizzle, MD;  Location: [facility];  Service: Urology;  Laterality: N/A;   Heart catherization  2004   2 stents to heart cypher left circulflex   HERNIA REPAIR  1999   left groin bilateral   HIP SURGERY Left 1999 or 2000  bond graft   Kidney removed Right 2003 or 2004   for cancer   LEFT HEART CATH AND CORONARY ANGIOGRAPHY N/A 09/29/2022   Procedure: LEFT HEART CATH AND CORONARY ANGIOGRAPHY;  Surgeon: Lucendia Rusk, MD;  Location: [facility] INVASIVE CV LAB;  Service: Cardiovascular;  Laterality: N/A;   POLYPECTOMY  10/28/2020   Procedure: POLYPECTOMY INTESTINAL;  Surgeon: Urban Garden, MD;  Location: [facility];  Service: Gastroenterology;;   PROSTATE BIOPSY  2021   RADIOACTIVE SEED IMPLANT N/A 11/03/2020   Procedure: RADIOACTIVE SEED IMPLANT/BRACHYTHERAPY IMPLANT;  Surgeon: Trent Frizzle, MD;  Location: [facility];  Service: Urology;  Laterality: N/A;  90 MINS   SPACE OAR INSTILLATION N/A 11/03/2020   Procedure: SPACE OAR INSTILLATION;  Surgeon:  Trent Frizzle, MD;  Location: [facility];  Service: Urology;  Laterality: N/A;    Social History:  reports that he quit smoking about 21 years ago. His smoking use included cigarettes. He started smoking about 53 years ago. He has a 48 pack-year smoking history. He has never used smokeless tobacco. He reports current alcohol use. He reports that he does not use drugs.   Allergies  Allergen Reactions   Other     Pt. Reports having seizures after anesthesia once 1999 or 2000 none since   Keflex  [Cephalexin ] Rash    Family History  Problem Relation Age of Onset   Heart disease Other    Cancer Other    Kidney disease Other    Heart disease Father    Prostate cancer Father    Heart disease Mother    Lung cancer Mother    Kidney cancer Mother    Prostate cancer Maternal Uncle    Bladder Cancer Maternal Grandfather    Breast cancer Neg Hx    Colon cancer Neg Hx    Pancreatic cancer Neg Hx      Prior to Admission medications   Medication Sig Start Date End Date Taking? Authorizing Provider  allopurinol  (ZYLOPRIM ) 100 MG tablet Take 2 tablets (200 mg total) by mouth daily. 02/10/23   Austine Lefort, MD  ALPRAZolam  (XANAX ) 0.5 MG tablet Take 1 tablet (0.5 mg total) by mouth 3 (three) times daily as needed for anxiety. 03/09/24   Austine Lefort, MD  amoxicillin -clavulanate (AUGMENTIN ) 875-125 MG tablet Take 1 tablet by mouth 2 (two) times daily. 01/17/24   Austine Lefort, MD  aspirin  81 MG tablet Take 81 mg by mouth daily.    [provider]  atorvastatin  (LIPITOR) 80 MG tablet Take 1 tablet (80 mg total) by mouth daily. 10/10/23   Austine Lefort, MD  carvedilol  (COREG ) 12.5 MG tablet Take 1 tablet (12.5 mg total) by mouth 2 (two) times daily. (AM & PM) 02/25/23   Mallipeddi, Vishnu P, MD  ezetimibe  (ZETIA ) 10 MG tablet Take 1 tablet (10 mg total) by mouth daily. 10/10/23   Austine Lefort, MD  furosemide  (LASIX ) 20 MG tablet Take 20 mg by mouth  daily as needed for edema or fluid.    [provider]  gabapentin  (NEURONTIN ) 300 MG capsule Take 1 capsule (300 mg total) by mouth at bedtime. 10/10/23   Austine Lefort, MD  hydrALAZINE  (APRESOLINE ) 25 MG tablet Take 1 tablet (25 mg total) by mouth 3 (three) times daily. (AM, NOON, PM) 02/25/23 02/20/24  Mallipeddi, Vishnu P, MD  hydrALAZINE  (APRESOLINE ) 50 MG tablet Take 1 tablet (50 mg total) by mouth 3 (three) times daily. 01/17/24  Austine Lefort, MD  HYDROcodone -acetaminophen  (NORCO/VICODIN) 5-325 MG tablet Take 1 tablet by mouth every 6 (six) hours as needed for moderate pain (pain score 4-6). 12/13/23   Austine Lefort, MD  HYDROcodone -acetaminophen  (NORCO/VICODIN) 5-325 MG tablet Take 1 tablet by mouth every 6 (six) hours as needed for moderate pain (pain score 4-6). 03/09/24   Austine Lefort, MD  lisinopril  (ZESTRIL ) 20 MG tablet Take 20 mg by mouth daily. (Dinner time)    [provider]  NICOTROL  10 MG inhaler INHALE 1 CARTRIDGE INTO THE LUNGS AS NEEDED FOR SMOKING CESSATION. 03/23/23   Austine Lefort, MD  NIFEdipine  (PROCARDIA  XL/NIFEDICAL-XL) 90 MG 24 hr tablet Take 1 tablet (90 mg total) by mouth at bedtime. 02/25/23   Mallipeddi, Vishnu P, MD  nitroGLYCERIN  (NITROSTAT ) 0.4 MG SL tablet Place 1 tablet (0.4 mg total) under the tongue every 5 (five) minutes as needed. Do not take within 2 days of viagra . 09/30/22   [facility], Warren Haber, PA  pantoprazole  (PROTONIX ) 40 MG tablet Take 1 tablet (40 mg total) by mouth 2 (two) times daily. 01/09/24   Austine Lefort, MD    Physical Exam: BP (!) 154/86   Pulse (!) 54   Temp 97.6 F (36.4 C) (Oral)   Resp 10   Ht 6' (1.829 m)   Wt 95.3 kg   SpO2 99%   BMI 28.48 kg/m   General: 63 y.o. year-old male well developed well nourished in no acute distress.  Alert and oriented x3. HEENT: NCAT, EOMI Neck: Supple, trachea medial Cardiovascular: Regular rate and rhythm with no rubs or gallops.  No thyromegaly or JVD  noted.  No lower extremity edema. 2/4 pulses in all 4 extremities. Respiratory: Clear to auscultation with no wheezes or rales. Good inspiratory effort. Abdomen: Soft, nontender nondistended with normal bowel sounds x4 quadrants. Muskuloskeletal: No cyanosis, clubbing or edema noted bilaterally Neuro: CN II-XII intact, strength 5/5 x 4, sensation, reflexes intact Skin: No ulcerative lesions noted or rashes Psychiatry: Judgement and insight appear normal. Mood is appropriate for condition and setting          Labs on Admission:  Basic Metabolic Panel: Recent Labs  Lab 04/05/24 0044  NA 139  K 4.1  CL 105  CO2 22  GLUCOSE 92  BUN 13  CREATININE 1.85*  CALCIUM  9.5  MG 1.7   Liver Function Tests: Recent Labs  Lab 04/05/24 0044  AST 23  ALT 19  ALKPHOS 70  BILITOT 1.0  PROT 7.3  ALBUMIN 4.1   No results for input(s): LIPASE, AMYLASE in the last 168 hours. No results for input(s): AMMONIA in the last 168 hours. CBC: Recent Labs  Lab 04/05/24 0044  WBC 9.4  NEUTROABS 7.4  HGB 16.4  HCT 46.8  MCV 91.1  PLT 220   Cardiac Enzymes: Recent Labs  Lab 04/05/24 0044  CKTOTAL 126    BNP (last 3 results) No results for input(s): BNP in the last 8760 hours.  ProBNP (last 3 results) No results for input(s): PROBNP in the last 8760 hours.  CBG: No results for input(s): GLUCAP in the last 168 hours.  Radiological Exams on Admission: DG Chest Portable 1 View Result Date: 04/05/2024 CLINICAL DATA:  Chest pain weakness EXAM: PORTABLE CHEST 1 VIEW COMPARISON:  09/28/2022 FINDINGS: Stable cardiomediastinal silhouette. Aortic atherosclerotic calcification. Chronic bronchitic changes. Bibasilar atelectasis/scarring. No pleural effusion or pneumothorax. No displaced rib fractures. IMPRESSION: Chronic bronchitic changes. No acute cardiopulmonary process. Electronically Signed   By:  Rozell Cornet M.D.   On: 04/05/2024 02:00    EKG: I independently viewed the EKG  done and my findings are as followed: Normal sinus rhythm at a rate of 81 bpm  Assessment/Plan Present on Admission:  Syncope  Essential hypertension  Mixed hyperlipidemia  GERD (gastroesophageal reflux disease)  Gout  Principal Problem:   Syncope Active Problems:   Gout   Essential hypertension   Mixed hyperlipidemia   GERD (gastroesophageal reflux disease)   CAD S/P percutaneous coronary angioplasty   Acute kidney injury superimposed on stage 3a chronic kidney disease (HCC)   History of prostate cancer  Syncope Continue telemetry and watch for arrhythmias Troponins x 2 was flat at 9  EKG showed normal sinus rhythm at a rate of 81 bpm Echocardiogram done on 09/30/2022 showed LVEF of 60-65%.  No RWMA Echocardiogram will be done to rule out significant aortic stenosis or other outflow obstruction, and also to evaluate EF and to rule out segmental/Regional wall motion abnormalities.  Carotid artery Dopplers will be done to rule out hemodynamically significant stenosis  Acute kidney injury superimposed on CKD 3B Creatinine 1.85 (baseline creatinine at 1.4) Continue gentle hydration Renally adjust medications, avoid nephrotoxic agents/dehydration/hypotension  Essential hypertension Continue Coreg , hydralazine , lisinopril   Mixed hyperlipidemia Continue Zetia , Lipitor  CAD Continue aspirin , Coreg , Lipitor  GERD Continue Protonix   Gout Continue allopurinol   History of prostate cancer Patient with grade 2 prostate cancer s/p I-125 brachytherapy  Stable.  Follows with Dr. Joie Narrow   DVT prophylaxis: Lovenox  Code Status: Full code  Family Communication: None at bedside  Consults: None  Severity of Illness: The appropriate patient status for this patient is OBSERVATION. Observation status is judged to be reasonable and necessary in order to provide the required intensity of service to ensure the patient's safety. The patient's presenting symptoms, physical exam  findings, and initial radiographic and laboratory data in the context of their medical condition is felt to place them at decreased risk for further clinical deterioration. Furthermore, it is anticipated that the patient will be medically stable for discharge from the hospital within 2 midnights of admission.   Author: Isidor Bromell, DO 04/05/2024 7:07 AM  For on call review [email or web address].

## 2024-04-05 NOTE — Plan of Care (Signed)
   Problem: Education: Goal: Knowledge of General Education information will improve Description: Including pain rating scale, medication(s)/side effects and non-pharmacologic comfort measures Outcome: Progressing   Problem: Clinical Measurements: Goal: Ability to maintain clinical measurements within normal limits will improve Outcome: Progressing Goal: Diagnostic test results will improve Outcome: Progressing

## 2024-04-05 NOTE — Procedures (Signed)
 Patient Name: Brandon Buck  MRN: [id number]  Epilepsy Attending: Arleene Lack  Referring Physician/Provider: Demaris Fillers, MD  Date: 04/05/2204 Duration: 24.50 mins  Patient history: 63yo M with LOC. EEG to evaluate for seizure.  Level of alertness: Awake  AEDs during EEG study: None  Technical aspects: This EEG study was done with scalp electrodes positioned according to the 10-20 International system of electrode placement. Electrical activity was reviewed with band pass filter of 1-70Hz , sensitivity of 7 uV/mm, display speed of 30mm/sec with a 60Hz  notched filter applied as appropriate. EEG data were recorded continuously and digitally stored.  Video monitoring was available and reviewed as appropriate.  Description: The posterior dominant rhythm consists of 8 Hz activity of moderate voltage (25-35 uV) seen predominantly in posterior head regions, symmetric and reactive to eye opening and eye closing. There is 15 to 18 Hz beta activity distributed symmetrically and diffusely. Physiologic photic driving was not seen during photic stimulation.  Hyperventilation was not performed.      IMPRESSION: This study is within normal limits. No seizures or epileptiform discharges were seen throughout the recording.  A normal interictal EEG does not exclude the diagnosis of epilepsy.   [name] [name]

## 2024-04-05 NOTE — TOC CM/SW Note (Signed)
 Transition of Care [facility]) - Inpatient Brief Assessment   Patient Details  Name: Brandon Buck MRN: [id number] Date of Birth: 12/15/1960  [facility] CM/SW Contact:    Cyndie Dredge, LCSWA Phone Number: 04/05/2024, 9:19 AM   Clinical Narrative:  Transition of Care Department [facility]) has reviewed patient and no TOC needs have been identified at this time. We will continue to monitor patient advancement through interdisciplinary progression rounds. If new patient transition needs arise, please place a TOC consult.   Transition of Care Asessment: Insurance and Status: Insurance coverage has been reviewed Patient has primary care physician: Yes Home environment has been reviewed: Single Family Home with spouse Prior level of function:: Independent Prior/Current Home Services: No current home services Social Drivers of Health Review: SDOH reviewed no interventions necessary Readmission risk has been reviewed: Yes Transition of care needs: no transition of care needs at this time

## 2024-04-05 NOTE — Discharge Summary (Signed)
 Physician Discharge Summary   Patient: Brandon Buck MRN: [id number] DOB: 1961-05-15  Admit date:     04/05/2024  Discharge date: 04/05/24  Discharge Physician: Myrtie Atkinson Jeanette Moffatt   PCP: Austine Lefort, MD   Recommendations at discharge:   Please follow up with primary care provider within 1-2 weeks  Please repeat BMP and CBC in one week  Hospital Course: 63 year old male with a history of hypertension, hyperlipidemia, coronary disease, CKD stage III, renal cancer status post right nephrectomy, prostate cancer status post seed implantation, COPD presenting with syncope.  The patient states that he was outside working on his truck most of the day.  He stated that he was sweating a lot because of the hot weather.  He was feeling dizzy.  He came inside the house and developed sharp left chest pain.  He continued to feel dizzy.  He took some nitroglycerin , shortly after had a syncopal episode in his chair.  He denied biting his tongue or bowel or bladder incontinence, but stated that his wife told him that he was stiff. He states that he has not been feeling himself for the last 4 to 5 days with malaise.  He denies any fevers, chills, nausea, vomiting, diarrhea.  However he did have 1 episode of vomiting from a syncopal episode. Notably, the patient has not episode about a week prior to this admission.  He stated that he had been working out in his yard most of the day and came inside the house to use the telephone.  [employer].  He had complaints of dizziness at that time as well. In the ED, the patient was afebrile hemodynamically stable with oxygen saturation 96 percent on room air.  wbc 9.4, hemoglobin 16.4, platelet 220.  sodium 139, potassium 4.1, 22, serum creatinine 1.5.  lfts were unremarkable.  chest x-ray showed chronic bronchitic changes.  The patient was given normal saline bolus and started on LR 100 cc/h.  He was mated for further evaluation treatment of his syncope   Assessment and  Plan: Syncope -Likely secondary to vagal phenomena/hypovolemia and with nitroglycerin  use resulting in transient hypotension and vasodilation - Echo--EF 65-70%, no WMA, no AS - EEG--neg - Cardiology consult appreciated>>ok for d/c - Troponin 9>>9 - Orthostatics--neg - Patient received IV fluids  Essential hypertension - Patient restarted on his home antihypertensive medications including carvedilol , hydralazine  - held lisinopril  during hospitalization>>restart after d/c  Coronary artery disease -s/p RCA and LCX PCI in 2009  -LHC in 09/2022 for unstable angina which showed patent RCA and LCX stents and mild non-obstructive CAD diffusely.  - Continue Coreg  and statin - Continue aspirin   CKD stage IIIa - Baseline creatinine 1.4-1.6 -repeat BMP one week after d/c  Mixed hyperlipidemia - Continue statin  Prostate cancer s/p seed implantation and brachytherapy  -follow urology      Consultants: cardiology Procedures performed: none  Disposition: Home Diet recommendation:  Cardiac diet DISCHARGE MEDICATION: Allergies as of 04/05/2024       Reactions   Other    Pt. Reports having seizures after anesthesia once 1999 or 2000 none since   Keflex  [cephalexin ] Rash        Medication List     STOP taking these medications    nitroGLYCERIN  0.4 MG SL tablet Commonly known as: NITROSTAT        TAKE these medications    allopurinol  100 MG tablet Commonly known as: ZYLOPRIM  Take 2 tablets (200 mg total) by mouth daily.   ALPRAZolam  0.5 MG tablet  Commonly known as: XANAX  Take 1 tablet (0.5 mg total) by mouth 3 (three) times daily as needed for anxiety. What changed: when to take this   aspirin  81 MG tablet Take 81 mg by mouth daily.   atorvastatin  80 MG tablet Commonly known as: LIPITOR Take 1 tablet (80 mg total) by mouth daily.   carvedilol  12.5 MG tablet Commonly known as: COREG  Take 1 tablet (12.5 mg total) by mouth 2 (two) times daily. (AM & PM)    ezetimibe  10 MG tablet Commonly known as: ZETIA  Take 1 tablet (10 mg total) by mouth daily.   furosemide  20 MG tablet Commonly known as: LASIX  Take 20 mg by mouth daily as needed for edema or fluid.   gabapentin  300 MG capsule Commonly known as: NEURONTIN  Take 1 capsule (300 mg total) by mouth at bedtime.   hydrALAZINE  50 MG tablet Commonly known as: APRESOLINE  Take 1 tablet (50 mg total) by mouth 3 (three) times daily.   HYDROcodone -acetaminophen  5-325 MG tablet Commonly known as: NORCO/VICODIN Take 1 tablet by mouth every 6 (six) hours as needed for moderate pain (pain score 4-6). What changed: Another medication with the same name was removed. Continue taking this medication, and follow the directions you see here.   lisinopril  20 MG tablet Commonly known as: ZESTRIL  Take 20 mg by mouth daily. (Dinner time)   multivitamin with minerals Tabs tablet Take 1 tablet by mouth daily.   NIFEdipine  90 MG 24 hr tablet Commonly known as: PROCARDIA  XL/NIFEDICAL-XL Take 1 tablet (90 mg total) by mouth at bedtime.   pantoprazole  40 MG tablet Commonly known as: PROTONIX  Take 1 tablet (40 mg total) by mouth 2 (two) times daily.        Follow-up Information     [name], PA-C Follow up.   Specialties: Cardiology, Cardiology Why: Cardiology Follow-up on 05/10/2024 at 8:00 AM. Please contact the office if needing a different date or time. Contact information: [address] [place] [address] [phone]                Discharge Exam: Cleavon Buck Weights   04/05/24 0045 04/05/24 0753  Weight: 95.3 kg 94.9 kg   HEENT:  [place]/AT, No thrush, no icterus CV:  RRR, no rub, no S3, no S4 Lung:  CTA, no wheeze, no rhonchi Abd:  soft/+BS, NT Ext:  No edema, no lymphangitis, no synovitis, no rash   Condition at discharge: stable  The results of significant diagnostics from this hospitalization (including imaging, microbiology, ancillary and laboratory) are listed  below for reference.   Imaging Studies: EEG adult Result Date: 04/05/2024 Arleene Lack, MD     04/05/2024  4:55 PM Patient Name: Brandon Buck MRN: [id number] Epilepsy Attending: Arleene Lack Referring Physician/Provider: Demaris Fillers, MD Date: 04/05/2204 Duration: 24.50 mins Patient history: 63yo M with LOC. EEG to evaluate for seizure. Level of alertness: Awake AEDs during EEG study: None Technical aspects: This EEG study was done with scalp electrodes positioned according to the 10-20 International system of electrode placement. Electrical activity was reviewed with band pass filter of 1-70Hz , sensitivity of 7 uV/mm, display speed of 49mm/sec with a 60Hz  notched filter applied as appropriate. EEG data were recorded continuously and digitally stored.  Video monitoring was available and reviewed as appropriate. Description: The posterior dominant rhythm consists of 8 Hz activity of moderate voltage (25-35 uV) seen predominantly in posterior head regions, symmetric and reactive to eye opening and eye closing. There is 15 to  18 Hz beta activity distributed symmetrically and diffusely. Physiologic photic driving was not seen during photic stimulation.  Hyperventilation was not performed.   IMPRESSION: This study is within normal limits. No seizures or epileptiform discharges were seen throughout the recording. A normal interictal EEG does not exclude the diagnosis of epilepsy. Arleene Lack   ECHOCARDIOGRAM COMPLETE Result Date: 04/05/2024    ECHOCARDIOGRAM REPORT   Patient Name:   TEJAY HUBERT Date of Exam: 04/05/2024 Medical Rec #:  [id number]       Height:       72.0 in Accession #:    [id number]      Weight:       209.2 lb Date of Birth:  [birth year]        BSA:          2.172 m Patient Age:    63 years        BP:           160/92 mmHg Patient Gender: M               HR:           60 bpm. Exam Location:  Inpatient Procedure: 2D Echo, Color Doppler and Cardiac Doppler (Both Spectral and Color             Flow Doppler were utilized during procedure). Indications:    Syncope  History:        Patient has prior history of Echocardiogram examinations, most                 recent 07/31/2022. Risk Factors:Hypertension.  Sonographer:    Jeralene Mom Referring Phys: [id number] OLADAPO ADEFESO IMPRESSIONS  1. Left ventricular ejection fraction, by estimation, is 65 to 70%. The left ventricle has normal function. The left ventricle has no regional wall motion abnormalities. There is mild left ventricular hypertrophy. Left ventricular diastolic parameters were normal.  2. Right ventricular systolic function is normal. The right ventricular size is normal.  3. Left atrial size was mildly dilated.  4. The mitral valve is normal in structure. Trivial mitral valve regurgitation. No evidence of mitral stenosis.  5. The aortic valve is tricuspid. Aortic valve regurgitation is not visualized. No aortic stenosis is present. FINDINGS  Left Ventricle: Left ventricular ejection fraction, by estimation, is 65 to 70%. The left ventricle has normal function. The left ventricle has no regional wall motion abnormalities. The left ventricular internal cavity size was normal in size. There is  mild left ventricular hypertrophy. Left ventricular diastolic parameters were normal. Right Ventricle: The right ventricular size is normal. Right vetricular wall thickness was not well visualized. Right ventricular systolic function is normal. Left Atrium: Left atrial size was mildly dilated. Right Atrium: Right atrial size was normal in size. Pericardium: There is no evidence of pericardial effusion. Mitral Valve: The mitral valve is normal in structure. Trivial mitral valve regurgitation. No evidence of mitral valve stenosis. Tricuspid Valve: The tricuspid valve is normal in structure. Tricuspid valve regurgitation is mild . No evidence of tricuspid stenosis. Aortic Valve: The aortic valve is tricuspid. Aortic valve regurgitation is not visualized.  No aortic stenosis is present. Aortic valve mean gradient measures 5.7 mmHg. Aortic valve peak gradient measures 10.6 mmHg. Aortic valve area, by VTI measures 2.43  cm. Pulmonic Valve: The pulmonic valve was not well visualized. Pulmonic valve regurgitation is not visualized. No evidence of pulmonic stenosis. Aorta: The aortic root and ascending aorta are structurally normal, with no evidence  of dilitation. IAS/Shunts: No atrial level shunt detected by color flow Doppler.  LEFT VENTRICLE PLAX 2D LVIDd:         4.10 cm   Diastology LVIDs:         2.50 cm   LV e' medial:    8.59 cm/s LV PW:         1.10 cm   LV E/e' medial:  10.9 LV IVS:        1.10 cm   LV e' lateral:   9.57 cm/s LVOT diam:     1.90 cm   LV E/e' lateral: 9.8 LV SV:         90 LV SV Index:   42 LVOT Area:     2.84 cm  RIGHT VENTRICLE RV Basal diam:  2.50 cm RV Mid diam:    2.30 cm RV S prime:     14.60 cm/s TAPSE (M-mode): 2.2 cm LEFT ATRIUM             Index        RIGHT ATRIUM           Index LA diam:        4.30 cm 1.98 cm/m   RA Area:     13.50 cm LA Vol (A2C):   84.1 ml 38.72 ml/m  RA Volume:   29.80 ml  13.72 ml/m LA Vol (A4C):   73.9 ml 34.03 ml/m LA Biplane Vol: 79.9 ml 36.79 ml/m  AORTIC VALVE AV Area (Vmax):    2.53 cm AV Area (Vmean):   2.30 cm AV Area (VTI):     2.43 cm AV Vmax:           162.67 cm/s AV Vmean:          109.333 cm/s AV VTI:            0.370 m AV Peak Grad:      10.6 mmHg AV Mean Grad:      5.7 mmHg LVOT Vmax:         145.00 cm/s LVOT Vmean:        88.800 cm/s LVOT VTI:          0.318 m LVOT/AV VTI ratio: 0.86  AORTA Ao Root diam: 3.00 cm Ao Asc diam:  3.20 cm MITRAL VALVE               TRICUSPID VALVE MV Area (PHT): 3.48 cm    TR Peak grad:   23.4 mmHg MV Decel Time: 218 msec    TR Vmax:        242.00 cm/s MR Peak grad: 18.1 mmHg MR Vmax:      213.00 cm/s  SHUNTS MV E velocity: 93.40 cm/s  Systemic VTI:  0.32 m MV A velocity: 55.80 cm/s  Systemic Diam: 1.90 cm MV E/A ratio:  1.67 Armida Lander MD  Electronically signed by Armida Lander MD Signature Date/Time: 04/05/2024/4:38:47 PM    Final    US  Carotid Bilateral Result Date: 04/05/2024 CLINICAL DATA:  Syncope while working in he had outside. Hypertension. Hyperlipidemia. Tobacco use. EXAM: BILATERAL CAROTID DUPLEX ULTRASOUND TECHNIQUE: Martina Sledge scale imaging, color Doppler and duplex ultrasound were performed of bilateral carotid and vertebral arteries in the neck. COMPARISON:  None available FINDINGS: Criteria: Quantification of carotid stenosis is based on velocity parameters that correlate the residual internal carotid diameter with NASCET-based stenosis levels, using the diameter of the distal internal carotid lumen as the denominator for stenosis measurement. The following velocity measurements  were obtained: RIGHT ICA: 111/14 cm/sec CCA: 126/11 cm/sec SYSTOLIC ICA/CCA RATIO:  0.9 ECA: 146 cm/sec LEFT ICA: 97/21 cm/sec CCA: 73/8 cm/sec SYSTOLIC ICA/CCA RATIO:  1.3 ECA: 287 cm/sec RIGHT CAROTID ARTERY: Moderate calcified plaque seen at the origin of the external carotid artery. Mild calcified plaque seen in the proximal RIGHT internal carotid artery. RIGHT VERTEBRAL ARTERY:  Antegrade flow. LEFT CAROTID ARTERY: Severe calcified plaque at the origin of the external carotid artery. Mild calcified plaque of the proximal internal carotid artery. LEFT VERTEBRAL ARTERY:  Antegrade flow. IMPRESSION: Mild calcified plaque of the proximal internal carotid arteries bilaterally with Doppler measurements consistent with less than 50% stenosis. Electronically Signed   By: Elester Grim M.D.   On: 04/05/2024 09:31   DG Chest Portable 1 View Result Date: 04/05/2024 CLINICAL DATA:  Chest pain weakness EXAM: PORTABLE CHEST 1 VIEW COMPARISON:  09/28/2022 FINDINGS: Stable cardiomediastinal silhouette. Aortic atherosclerotic calcification. Chronic bronchitic changes. Bibasilar atelectasis/scarring. No pleural effusion or pneumothorax. No displaced rib fractures.  IMPRESSION: Chronic bronchitic changes. No acute cardiopulmonary process. Electronically Signed   By: Rozell Cornet M.D.   On: 04/05/2024 02:00    Microbiology: Results for orders placed or performed in visit on 01/18/23  Microscopic Examination     Status: Abnormal   Collection Time: 01/18/23  9:35 AM   Urine  Result Value Ref Range Status   WBC, UA 0-5 0 - 5 /hpf Final   RBC, Urine 0-2 0 - 2 /hpf Final   Epithelial Cells (non renal) 0-10 0 - 10 /hpf Final   Casts Present (A) None seen /lpf Final   Cast Type Hyaline casts N/A Final   Bacteria, UA None seen None seen/Few Final    Labs: CBC: Recent Labs  Lab 04/05/24 0044  WBC 9.4  NEUTROABS 7.4  HGB 16.4  HCT 46.8  MCV 91.1  PLT 220   Basic Metabolic Panel: Recent Labs  Lab 04/05/24 0044  NA 139  K 4.1  CL 105  CO2 22  GLUCOSE 92  BUN 13  CREATININE 1.85*  CALCIUM  9.5  MG 1.7   Liver Function Tests: Recent Labs  Lab 04/05/24 0044  AST 23  ALT 19  ALKPHOS 70  BILITOT 1.0  PROT 7.3  ALBUMIN 4.1   CBG: No results for input(s): GLUCAP in the last 168 hours.  Discharge time spent: greater than 30 minutes.  Signed: Demaris Fillers, MD [facility] 04/05/2024

## 2024-04-05 NOTE — ED Triage Notes (Signed)
 Pt was working outside today and stated didn't feel good all day. At 2315 pt took a nitroglycerin  and started seeing black. When he woke up he was throwing up. Pain on the left flank and shoulder blade rated 6/10.

## 2024-04-05 NOTE — Hospital Course (Signed)
 63 year old male with a history of hypertension, hyperlipidemia, coronary disease, CKD stage III, renal cancer status post right nephrectomy, prostate cancer status post seed implantation, COPD presenting with syncope.  The patient states that he was outside working on his truck most of the day.  He stated that he was sweating a lot because of the hot weather.  He was feeling dizzy.  He came inside the house and developed sharp left chest pain.  He continued to feel dizzy.  He took some nitroglycerin , shortly after had a syncopal episode in his chair.  He denied biting his tongue or bowel or bladder incontinence, but stated that his wife told him that he was stiff. He states that he has not been feeling himself for the last 4 to 5 days with malaise.  He denies any fevers, chills, nausea, vomiting, diarrhea.  However he did have 1 episode of vomiting from a syncopal episode. Notably, the patient has not episode about a week prior to this admission.  He stated that he had been working out in his yard most of the day and came inside the house to use the telephone.  [employer].  He had complaints of dizziness at that time as well. In the ED, the patient was afebrile hemodynamically stable with oxygen saturation 96 percent on room air.  wbc 9.4, hemoglobin 16.4, platelet 220.  sodium 139, potassium 4.1, 22, serum creatinine 1.5.  lfts were unremarkable.  chest x-ray showed chronic bronchitic changes.  The patient was given normal saline bolus and started on LR 100 cc/h.  He was mated for further evaluation treatment of his syncope

## 2024-04-06 ENCOUNTER — Telehealth: Payer: Self-pay

## 2024-04-06 NOTE — Transitions of Care (Post Inpatient/ED Visit) (Signed)
   04/06/2024  Name: Brandon Buck MRN: [id number] DOB: 03-29-1961  Today's TOC FU Call Status: Today's TOC FU Call Status:: Successful TOC FU Call Completed Unsuccessful Call (1st Attempt) Date: 04/06/24 [facility] FU Call Complete Date: 04/06/24 Patient's Name and Date of Birth confirmed.  Transition Care Management Follow-up Telephone Call Date of Discharge: 04/05/24 [facility] Type of Discharge: Inpatient Admission Primary Inpatient Discharge Diagnosis:: end stage renal disease syncope How have you been since you were released from the hospital?: Better Any questions or concerns?: No  Items Reviewed: Did you receive and understand the discharge instructions provided?: Yes Medications obtained,verified, and reconciled?: Yes (Medications Reviewed) Any new allergies since your discharge?: No Dietary orders reviewed?: Yes Do you have support at home?: Yes People in Home [RPT]: spouse  Medications Reviewed Today: Medications Reviewed Today   Medications were not reviewed in this encounter     Home Care and Equipment/Supplies: Were Home Health Services Ordered?: NA Any new equipment or medical supplies ordered?: NA  Functional Questionnaire: Do you need assistance with bathing/showering or dressing?: No Do you need assistance with meal preparation?: No Do you need assistance with eating?: No Do you have difficulty maintaining continence: No Do you need assistance with getting out of bed/getting out of a chair/moving?: No Do you have difficulty managing or taking your medications?: No  Follow up appointments reviewed: PCP Follow-up appointment confirmed?: Yes Date of PCP follow-up appointment?: 04/19/24 Follow-up Provider: [facility] Follow-up appointment confirmed?: Yes Date of Specialist follow-up appointment?: 05/10/24 Follow-Up Specialty Provider:: cardio Do you need transportation to your follow-up appointment?: No Do you  understand care options if your condition(s) worsen?: Yes-patient verbalized understanding    SIGNATURE Darrall Ellison, LPN [facility] Nurse Health Advisor Direct Dial [phone]

## 2024-04-06 NOTE — Transitions of Care (Post Inpatient/ED Visit) (Signed)
   04/06/2024  Name: Brandon Buck MRN: [id number] DOB: 1960-11-20  Today's TOC FU Call Status: Today's TOC FU Call Status:: Unsuccessful Call (1st Attempt) Unsuccessful Call (1st Attempt) Date: 04/06/24  Attempted to reach the patient regarding the most recent Inpatient/ED visit.  Follow Up Plan: Additional outreach attempts will be made to reach the patient to complete the Transitions of Care (Post Inpatient/ED visit) call.   Signature Darrall Ellison, LPN [facility] Nurse Health Advisor Direct Dial [phone]

## 2024-04-10 ENCOUNTER — Other Ambulatory Visit: Payer: Self-pay | Admitting: Family Medicine

## 2024-04-10 DIAGNOSIS — F411 Generalized anxiety disorder: Secondary | ICD-10-CM

## 2024-04-10 NOTE — Telephone Encounter (Signed)
 Copied from CRM [id number]. Topic: Clinical - Medication Refill >> Apr 10, 2024  2:09 PM Donee H wrote: Medication: ALPRAZolam  (XANAX ) 0.5 MG tablet , HYDROcodone -acetaminophen  (NORCO/VICODIN) 5-325 MG table  Has the patient contacted their pharmacy? Yes pharmacy stated did not have any refills and he needed to reach out to provider (Agent: If no, request that the patient contact the pharmacy for the refill. If patient does not wish to contact the pharmacy document the reason why and proceed with request.) (Agent: If yes, when and what did the pharmacy advise?)  This is the patient's preferred pharmacy:  [facility] - [place], [place] - [address] [place] [address] Phone: [phone] Fax: [phone]  Is this the correct pharmacy for this prescription? Yes If no, delete pharmacy and type the correct one.   Has the prescription been filled recently? No  Is the patient out of the medication? No  Has the patient been seen for an appointment in the last year OR does the patient have an upcoming appointment? Yes  Can we respond through MyChart? No  Agent: Please be advised that Rx refills may take up to 3 business days. We ask that you follow-up with your pharmacy.

## 2024-04-12 MED ORDER — ALPRAZOLAM 0.5 MG PO TABS: 0.5000 mg | ORAL_TABLET | Freq: Three times a day (TID) | ORAL | 0 refills | Status: DC | PRN

## 2024-04-12 MED ORDER — HYDROCODONE-ACETAMINOPHEN 5-325 MG PO TABS: 1.0000 | ORAL_TABLET | Freq: Four times a day (QID) | ORAL | 0 refills | Status: DC | PRN

## 2024-04-12 NOTE — Telephone Encounter (Signed)
 Requested medications are due for refill today.  yes  Requested medications are on the active medications list.  yes  Last refill. 03/09/2024   Future visit scheduled.   yes  Notes to clinic.  Refills not delegated    Requested Prescriptions  Pending Prescriptions Disp Refills   HYDROcodone -acetaminophen  (NORCO/VICODIN) 5-325 MG tablet 90 tablet 0    Sig: Take 1 tablet by mouth every 6 (six) hours as needed for moderate pain (pain score 4-6).     Not Delegated - Analgesics:  Opioid Agonist Combinations Failed - 04/12/2024  2:34 PM      Failed - This refill cannot be delegated      Failed - Urine Drug Screen completed in last 360 days      Failed - Valid encounter within last 3 months    Recent Outpatient Visits           2 months ago Essential hypertension   [place] [facility] Austine Lefort, MD   6 months ago Essential hypertension   [facility] Cheril Cork, Cisco Crest, MD   1 year ago ASCVD (arteriosclerotic cardiovascular disease)   [place] [facility] Pickard, Cisco Crest, MD       Future Appointments             In 4 weeks Finis Hugger, Dimple Francis, PA-C [place] [facility]   In 4 months Trent Frizzle, MD [facility] [place]             ALPRAZolam  (XANAX ) 0.5 MG tablet 30 tablet 0    Sig: Take 1 tablet (0.5 mg total) by mouth 3 (three) times daily as needed for anxiety.     Not Delegated - Psychiatry: Anxiolytics/Hypnotics 2 Failed - 04/12/2024  2:34 PM      Failed - This refill cannot be delegated      Failed - Urine Drug Screen completed in last 360 days      Failed - Valid encounter within last 6 months    Recent Outpatient Visits           2 months ago Essential hypertension   [place] [facility] Austine Lefort, MD   6 months ago Essential hypertension   [place] [facility] Austine Lefort, MD   1  year ago ASCVD (arteriosclerotic cardiovascular disease)   [place] [facility] Austine Lefort, MD       Future Appointments             In 4 weeks Strader, Dimple Francis, PA-C  [facility] [place] [facility]   In 4 months Trent Frizzle, MD [facility] - Patient is not pregnant

## 2024-04-12 NOTE — Telephone Encounter (Signed)
 Requested medications are due for refill today.  yes   Requested medications are on the active medications list.  yes   Last refill. 03/09/2024    Future visit scheduled.   yes   Notes to clinic.  Refills not delegated    Requested Prescriptions  Pending Prescriptions Disp Refills   HYDROcodone -acetaminophen  (NORCO/VICODIN) 5-325 MG tablet 90 tablet 0    Sig: Take 1 tablet by mouth every 6 (six) hours as needed for moderate pain (pain score 4-6).     Not Delegated - Analgesics:  Opioid Agonist Combinations Failed - 04/12/2024  2:35 PM      Failed - This refill cannot be delegated      Failed - Urine Drug Screen completed in last 360 days      Failed - Valid encounter within last 3 months    Recent Outpatient Visits           2 months ago Essential hypertension   [place] Austine Lefort, MD   6 months ago Essential hypertension   [place] [facility] Cheril Cork, Cisco Crest, MD   1 year ago ASCVD (arteriosclerotic cardiovascular disease)   [facility] Austine Lefort, MD       Future Appointments             In 4 weeks Finis Hugger, Dimple Francis, PA-C [facility] [place] [facility]   In 4 months Trent Frizzle, MD [facility] [place]             ALPRAZolam  (XANAX ) 0.5 MG tablet 30 tablet 0    Sig: Take 1 tablet (0.5 mg total) by mouth 3 (three) times daily as needed for anxiety.     Not Delegated - Psychiatry: Anxiolytics/Hypnotics 2 Failed - 04/12/2024  2:35 PM      Failed - This refill cannot be delegated      Failed - Urine Drug Screen completed in last 360 days      Failed - Valid encounter within last 6 months    Recent Outpatient Visits           2 months ago Essential hypertension   [place] [facility] Austine Lefort, MD   6 months ago Essential hypertension   [facility] Austine Lefort, MD    1 year ago ASCVD (arteriosclerotic cardiovascular disease)   [place] [facility] Austine Lefort, MD       Future Appointments             In 4 weeks Strader, Dimple Francis, PA-C [facility], [place] [facility]   In 4 months Trent Frizzle, MD [facility] - Patient is not pregnant

## 2024-04-19 ENCOUNTER — Encounter: Payer: Self-pay | Admitting: Family Medicine

## 2024-04-19 ENCOUNTER — Ambulatory Visit: Admitting: Family Medicine

## 2024-04-19 VITALS — BP 138/80 | HR 66 | Temp 98.6°F | Ht 72.0 in | Wt 209.5 lb

## 2024-04-19 DIAGNOSIS — R55 Syncope and collapse: Secondary | ICD-10-CM | POA: Diagnosis not present

## 2024-04-19 LAB — BASIC METABOLIC PANEL WITHOUT GFR
BUN/Creatinine Ratio: 12 (calc) (ref 6–22)
BUN: 19 mg/dL (ref 7–25)
CO2: 29 mmol/L (ref 20–32)
Calcium: 9.4 mg/dL (ref 8.6–10.3)
Chloride: 100 mmol/L (ref 98–110)
Creat: 1.54 mg/dL — ABNORMAL HIGH (ref 0.70–1.35)
Glucose, Bld: 88 mg/dL (ref 65–99)
Potassium: 4.3 mmol/L (ref 3.5–5.3)
Sodium: 137 mmol/L (ref 135–146)

## 2024-04-19 NOTE — Progress Notes (Signed)
 Subjective:    Patient ID: Brandon Buck, male    DOB: 16-Jun-1961, 63 y.o.   MRN: [id number]  Patient has a history of prostate cancer s/p radioactive seed placement.  He also has a history of HTN, HLD, and CAD s/p PTCA.  He is here today for follow-up of his recent hospitalization: Admit date:     04/05/2024  Discharge date: 04/05/24  Discharge Physician: Alm Tat    PCP: Duanne Butler DASEN, MD    Recommendations at discharge:   Please follow up with primary care provider within 1-2 weeks  Please repeat BMP and CBC in one week   Hospital Course: 63 year old male with a history of hypertension, hyperlipidemia, coronary disease, CKD stage III, renal cancer status post right nephrectomy, prostate cancer status post seed implantation, COPD presenting with syncope.  The patient states that he was outside working on his truck most of the day.  He stated that he was sweating a lot because of the hot weather.  He was feeling dizzy.  He came inside the house and developed sharp left chest pain.  He continued to feel dizzy.  He took some nitroglycerin , shortly after had a syncopal episode in his chair.  He denied biting his tongue or bowel or bladder incontinence, but stated that his wife told him that he was stiff. He states that he has not been feeling himself for the last 4 to 5 days with malaise.  He denies any fevers, chills, nausea, vomiting, diarrhea.  However he did have 1 episode of vomiting from a syncopal episode. Notably, the patient has not episode about a week prior to this admission.  He stated that he had been working out in his yard most of the day and came inside the house to use the telephone.  [employer].  He had complaints of dizziness at that time as well. In the ED, the patient was afebrile hemodynamically stable with oxygen saturation 96 percent on room air.  wbc 9.4, hemoglobin 16.4, platelet 220.  sodium 139, potassium 4.1, 22, serum creatinine 1.5.  lfts were unremarkable.   chest x-ray showed chronic bronchitic changes.  The patient was given normal saline bolus and started on LR 100 cc/h.  He was mated for further evaluation treatment of his syncope     Assessment and Plan: Syncope -Likely secondary to vagal phenomena/hypovolemia and with nitroglycerin  use resulting in transient hypotension and vasodilation - Echo--EF 65-70%, no WMA, no AS - EEG--neg - Cardiology consult appreciated>>ok for d/c - Troponin 9>>9 - Orthostatics--neg - Patient received IV fluids   Essential hypertension - Patient restarted on his home antihypertensive medications including carvedilol , hydralazine  - held lisinopril  during hospitalization>>restart after d/c   Coronary artery disease -s/p RCA and LCX PCI in 2009  -LHC in 09/2022 for unstable angina which showed patent RCA and LCX stents and mild non-obstructive CAD diffusely.  - Continue Coreg  and statin - Continue aspirin    CKD stage IIIa - Baseline creatinine 1.4-1.6 -repeat BMP one week after d/c   Mixed hyperlipidemia - Continue statin   Prostate cancer s/p seed implantation and brachytherapy  -follow urology   Patient has been doing well since his hospitalization.  His blood pressure has been excellent.  Today is 138/80.  Both episodes of syncope occurred when he was dehydrated after working out in the heat.  I believe all of his symptoms are due to hypotension due to a combination of factors.  The patient has been working for hours in the  heat and sweating.  His labs showed evidence of dehydration.  He has polypharmacy with numerous antihypertensives.  He then took nitroglycerin .  I believe he was also having a vasovagal attack.  Because of your tangential thinking.  Therefore we spent today's encounter discussing how to prevent this in the future.  Past Medical History:  Diagnosis Date   Anxiety    Arthritis    Asthma    Cancer (HCC)    right renal cancer, right radical nephrectomy 2004   Chronic back pain     Chronic hip pain, left    CKD (chronic kidney disease) stage 3, GFR 30-59 ml/min (HCC)    ckd stage 3 b    Complication of anesthesia    1 seizure after anesthesia 1999 or 2000 none since , recent surgeries no seizures   Coronary artery disease    a. s/p prior PCI to RCA and LCx in 2009   Depression    GERD (gastroesophageal reflux disease)    Gout    no recent flares   Heart disease    High blood pressure    History of PTCA 2 2004   2 stents   History of stomach ulcers    Hypercholesteremia    Hypertension    Kidney disease    Myocardial infarction (HCC) 2004   Prostate cancer (HCC)    3/12 cores upt to 40% grade 2 cancer. Planning brachytherapy (2021)   Proteinuria    Renal insufficiency    Tennis elbow    Past Surgical History:  Procedure Laterality Date   COLONOSCOPY WITH PROPOFOL  N/A 10/28/2020   Procedure: COLONOSCOPY WITH PROPOFOL ;  Surgeon: Eartha Angelia Sieving, MD;  [facility];  Service: Gastroenterology;  Laterality: N/A;  2:30   coloscopy  10/28/2020   removed 7 polyps done at [facility]   CYSTOSCOPY N/A 11/03/2020   Procedure: CYSTOSCOPY;  Surgeon: Matilda Senior, MD;  Location: [facility];  Service: Urology;  Laterality: N/A;   Heart catherization  2004   2 stents to heart cypher left circulflex   HERNIA REPAIR  1999   left groin bilateral   HIP SURGERY Left 1999 or 2000   bond graft   Kidney removed Right 2003 or 2004   for cancer   LEFT HEART CATH AND CORONARY ANGIOGRAPHY N/A 09/29/2022   Procedure: LEFT HEART CATH AND CORONARY ANGIOGRAPHY;  Surgeon: Dann Candyce RAMAN, MD;  [facility];  Service: Cardiovascular;  Laterality: N/A;   POLYPECTOMY  10/28/2020   Procedure: POLYPECTOMY INTESTINAL;  Surgeon: Eartha Angelia Sieving, MD;  [facility]: [facility];  Service: Gastroenterology;;   PROSTATE BIOPSY  2021   RADIOACTIVE SEED IMPLANT N/A 11/03/2020   Procedure: RADIOACTIVE SEED IMPLANT/BRACHYTHERAPY  IMPLANT;  Surgeon: Matilda Senior, MD;  Location: [facility];  Service: Urology;  Laterality: N/A;  90 MINS   SPACE OAR INSTILLATION N/A 11/03/2020   Procedure: SPACE OAR INSTILLATION;  Surgeon: Matilda Senior, MD;  Location: [facility];  Service: Urology;  Laterality: N/A;   Current Outpatient Medications on File Prior to Visit  Medication Sig Dispense Refill   allopurinol  (ZYLOPRIM ) 100 MG tablet Take 2 tablets (200 mg total) by mouth daily. 180 tablet 1   ALPRAZolam  (XANAX ) 0.5 MG tablet Take 1 tablet (0.5 mg total) by mouth 3 (three) times daily as needed for anxiety. 30 tablet 0   aspirin  81 MG tablet Take 81 mg by mouth daily.     atorvastatin  (  LIPITOR) 80 MG tablet Take 1 tablet (80 mg total) by mouth daily. 90 tablet 3   carvedilol  (COREG ) 12.5 MG tablet Take 1 tablet (12.5 mg total) by mouth 2 (two) times daily. (AM & PM)     ezetimibe  (ZETIA ) 10 MG tablet Take 1 tablet (10 mg total) by mouth daily. 90 tablet 3   furosemide  (LASIX ) 20 MG tablet Take 20 mg by mouth daily as needed for edema or fluid.     gabapentin  (NEURONTIN ) 300 MG capsule Take 1 capsule (300 mg total) by mouth at bedtime. 30 capsule 3   hydrALAZINE  (APRESOLINE ) 50 MG tablet Take 1 tablet (50 mg total) by mouth 3 (three) times daily. 270 tablet 3   HYDROcodone -acetaminophen  (NORCO/VICODIN) 5-325 MG tablet Take 1 tablet by mouth every 6 (six) hours as needed for moderate pain (pain score 4-6). 90 tablet 0   Multiple Vitamin (MULTIVITAMIN WITH MINERALS) TABS tablet Take 1 tablet by mouth daily.     NIFEdipine  (PROCARDIA  XL/NIFEDICAL-XL) 90 MG 24 hr tablet Take 1 tablet (90 mg total) by mouth at bedtime.     pantoprazole  (PROTONIX ) 40 MG tablet Take 1 tablet (40 mg total) by mouth 2 (two) times daily. 180 tablet 3   lisinopril  (ZESTRIL ) 20 MG tablet Take 20 mg by mouth daily. (Dinner time) (Patient not taking: Reported on 04/19/2024)     No current facility-administered medications on  file prior to visit.    Allergies  Allergen Reactions   Other     Pt. Reports having seizures after anesthesia once 1999 or 2000 none since   Keflex  [Cephalexin ] Rash   Social History   Socioeconomic History   Marital status: Married    Spouse name: Sheree   Number of children: 0   Years of education: Not on file   Highest education level: Not on file  Occupational History   Occupation: retired  Tobacco Use   Smoking status: Former    Current packs/day: 0.00    Average packs/day: 1.5 packs/day for 32.0 years (48.0 ttl pk-yrs)    Types: Cigarettes    Start date: 01/26/1971    Quit date: 01/26/2003    Years since quitting: 21.2   Smokeless tobacco: Never  Vaping Use   Vaping status: Never Used  Substance and Sexual Activity   Alcohol use: Yes    Alcohol/week: 0.0 standard drinks of alcohol    Comment: occ 2 week   Drug use: No   Sexual activity: Yes  Other Topics Concern   Not on file  Social History Narrative   Worked as an Personnel officer.    Married x 45 years in 2022.   Social Drivers of Corporate investment banker Strain: Low Risk  (10/04/2022)   Overall Financial Resource Strain (CARDIA)    Difficulty of Paying Living Expenses: Not hard at all  Food Insecurity: No Food Insecurity (04/05/2024)   Hunger Vital Sign    Worried About Running Out of Food in the Last Year: Never true    Ran Out of Food in the Last Year: Never true  Transportation Needs: No Transportation Needs (04/05/2024)   PRAPARE - Administrator, Civil Service (Medical): No    Lack of Transportation (Non-Medical): No  Physical Activity: Sufficiently Active (07/24/2021)   Exercise Vital Sign    Days of Exercise per Week: 5 days    Minutes of Exercise per Session: 30 min  Stress: No Stress Concern Present (07/24/2021)   [employer] of Occupational Health -  Occupational Stress Questionnaire    Feeling of Stress : Not at all  Social Connections: Socially Integrated (07/24/2021)   Social  Connection and Isolation Panel    Frequency of Communication with Friends and Family: More than three times a week    Frequency of Social Gatherings with Friends and Family: Three times a week    Attends Religious Services: 1 to 4 times per year    Active Member of Clubs or Organizations: No    Attends Banker Meetings: 1 to 4 times per year    Marital Status: Married  Catering manager Violence: Not At Risk (04/05/2024)   Humiliation, Afraid, Rape, and Kick questionnaire    Fear of Current or Ex-Partner: No    Emotionally Abused: No    Physically Abused: No    Sexually Abused: No      Review of Systems  All other systems reviewed and are negative.      Objective:   Physical Exam Vitals reviewed.  Constitutional:      General: He is not in acute distress.    Appearance: He is well-developed. He is not diaphoretic.  HENT:     Right Ear: There is impacted cerumen.     Left Ear: Tympanic membrane, ear canal and external ear normal.     Nose: Nose normal.     Mouth/Throat:     Pharynx: No oropharyngeal exudate.   Eyes:     General: No scleral icterus.    Conjunctiva/sclera: Conjunctivae normal.    Cardiovascular:     Rate and Rhythm: Normal rate and regular rhythm.     Pulses:          Dorsalis pedis pulses are 2+ on the right side and 2+ on the left side.       Posterior tibial pulses are 2+ on the right side and 2+ on the left side.     Heart sounds: Normal heart sounds.  Pulmonary:     Effort: Pulmonary effort is normal. No respiratory distress.     Breath sounds: Normal breath sounds. No wheezing or rales.  Abdominal:     General: Bowel sounds are normal. There is no distension.     Palpations: Abdomen is soft.     Tenderness: There is abdominal tenderness. There is guarding. There is no rebound.     Hernia: There is no hernia in the left inguinal area.  Genitourinary:    Penis: Normal.      Testes: Normal.        Right: Mass or tenderness not  present.        Left: Mass or tenderness not present.   Musculoskeletal:     Cervical back: Neck supple.     Right foot: Normal range of motion. No deformity or bunion.     Left foot: Normal range of motion. No deformity or bunion.  Lymphadenopathy:     Cervical: No cervical adenopathy.           Assessment & Plan:  Syncope, unspecified syncope type - Plan: Basic Metabolic Panel Without GFR I recommended to the patient that he is going to be working out in the heat, he should probably skip the dose of hydralazine .  Resume the next dose of hydralazine  once he is back inside and in his normal environment.  He should also try to work in the cool parts of the day, take frequent breaks, and drink plenty of fluid.  Also explained to the patient that if  this happens again, he should lie down as quickly as possible and raise his legs to increase cerebral perfusion.  Avoid nitroglycerin  unless he is having substernal angina.  Avoid Lasix .  This only dehydrates a person further.

## 2024-04-20 ENCOUNTER — Ambulatory Visit: Payer: Self-pay | Admitting: Family Medicine

## 2024-05-08 ENCOUNTER — Other Ambulatory Visit: Payer: Self-pay | Admitting: Family Medicine

## 2024-05-08 ENCOUNTER — Telehealth: Payer: Self-pay | Admitting: Family Medicine

## 2024-05-08 MED ORDER — HYDROCODONE-ACETAMINOPHEN 5-325 MG PO TABS: 1.0000 | ORAL_TABLET | Freq: Four times a day (QID) | ORAL | 0 refills | Status: DC | PRN

## 2024-05-08 NOTE — Telephone Encounter (Signed)
 Prescription Request  05/08/2024  LOV: 04/19/2024  What is the name of the medication or equipment?   ALPRAZolam  (XANAX ) 0.5 MG tablet   HYDROcodone -acetaminophen  (NORCO/VICODIN) 5-325 MG tablet [[id number]] - **90 day script reqested**  Have you contacted your pharmacy to request a refill? Yes   Which pharmacy would you like this sent to?  [facility] - [place], [place] - [address] [place] [address] Phone: [phone] Fax: [phone]    Patient notified that their request is being sent to the clinical staff for review and that they should receive a response within 2 business days.   Please advise pharmacist.

## 2024-05-09 ENCOUNTER — Other Ambulatory Visit: Payer: Self-pay | Admitting: Family Medicine

## 2024-05-09 DIAGNOSIS — F411 Generalized anxiety disorder: Secondary | ICD-10-CM

## 2024-05-09 NOTE — Progress Notes (Deleted)
 Cardiology Office Note    Date:  05/09/2024  ID:  Brandon Buck, DOB April 22, 1961, MRN [id number] Cardiologist: Diannah SHAUNNA Maywood, MD    History of Present Illness:    Brandon Buck is a 63 y.o. male with past medical history of CAD (s/p prior PCI to RCA and LCx in 2009, cath in 09/2022 showing patent stents along RCA and LCx with mild nonobstructive disease), HTN, HLD, COPD, prostate cancer (s/p seed implantation and brachytherapy) and Stage 3 CKD (prior right nephrectomy in 2003) who presents to the office today for hospital follow-up.  He most recently presented to [facility] ED on 04/05/2024 after suffering a syncopal episode. He reported working out in the sun for 11+ hours the day prior and had experienced intermittent lightheadedness, dizziness and nausea. He developed left-sided chest pain and took a nitroglycerin  tablet and then passed out. Hs troponin values were negative and EKG showed no acute ST changes when compared to prior tracings. He did have an AKI which was likely prerenal and his syncopal episode was felt to be due to hypovolemia. A repeat echocardiogram was obtained which showed a preserved EF of 65 to 70% with no regional wall motion abnormalities and normal RV function. No further cardiac testing was recommended at that time. Creatinine had peaked at 1.85 during admission and was rechecked by his PCP on 04/19/2024 and at 1.54 which is close to his known baseline.  ROS: ***  Studies Reviewed:   EKG: EKG is*** ordered today and demonstrates ***   EKG Interpretation Date/Time:    Ventricular Rate:    PR Interval:    QRS Duration:    QT Interval:    QTC Calculation:   R Axis:      Text Interpretation:         Cardiac Catheterization: 09/2022   Prox RCA to Mid RCA lesion is 30% stenosed.   Previously placed Dist RCA stent of unknown type is  widely patent.   Previously placed Prox Cx to Mid Cx stent of unknown type is  widely patent.   The left ventricular  systolic function is normal.   LV end diastolic pressure is normal.   The left ventricular ejection fraction is 55-65% by visual estimate.   There is no aortic valve stenosis.   Mild tortuosity in the right subclavian which makes access to the ascending aorta somewhat difficult.   Patent stents in the RCA and circumflex.  Nonobstructive, mild disease diffusely.  Continue aggressive secondary prevention including blood pressure control.  The patient states that his blood pressure at home is often over 200 mmHg systolic.  I stressed the importance of risk factor modification including blood pressure control.  Echocardiogram: 03/2024 IMPRESSIONS     1. Left ventricular ejection fraction, by estimation, is 65 to 70%. The  left ventricle has normal function. The left ventricle has no regional  wall motion abnormalities. There is mild left ventricular hypertrophy.  Left ventricular diastolic parameters  were normal.   2. Right ventricular systolic function is normal. The right ventricular  size is normal.   3. Left atrial size was mildly dilated.   4. The mitral valve is normal in structure. Trivial mitral valve  regurgitation. No evidence of mitral stenosis.   5. The aortic valve is tricuspid. Aortic valve regurgitation is not  visualized. No aortic stenosis is present.   Carotid Dopplers: 03/2024 IMPRESSION: Mild calcified plaque of the proximal internal carotid arteries bilaterally with Doppler measurements consistent with less  than 50% stenosis.   Risk Assessment/Calculations:   {Does this patient have ATRIAL FIBRILLATION?:[phone]} No BP recorded.  {Refresh Note OR Click here to enter BP  :1}***         Physical Exam:   VS:  There were no vitals taken for this visit.   Wt Readings from Last 3 Encounters:  04/19/24 209 lb 8 oz (95 kg)  04/05/24 209 lb 3.5 oz (94.9 kg)  01/17/24 210 lb (95.3 kg)     GEN: Well nourished, well developed in no acute distress NECK: No JVD;  No carotid bruits CARDIAC: ***RRR, no murmurs, rubs, gallops RESPIRATORY:  Clear to auscultation without rales, wheezing or rhonchi  ABDOMEN: Appears non-distended. No obvious abdominal masses. EXTREMITIES: No clubbing or cyanosis. No edema.  Distal pedal pulses are 2+ bilaterally.   Assessment and Plan:   1. Coronary artery disease involving native coronary artery of native heart without angina pectoris - He previously underwent stenting to the RCA and LCx in 2009 and cardiac catheterization in 09/2022 showed patent stents with mild, nonobstructive disease elsewhere as outlined above.  2. Essential hypertension - BP is at *** during today's visit.   3. Hyperlipidemia LDL goal <70 - LDL was at 72 when checked in 12/2023.  Continue current medical therapy with Atorvastatin  80 mg daily.  4. Stage 3a chronic kidney disease (HCC) - Baseline creatinine 1.5 - 1.6. Peaked at 1.85 during his recent admission and had improved to 1.54 when checked on 04/19/2024.    Signed, Laymon CHRISTELLA Qua, PA-C

## 2024-05-09 NOTE — Telephone Encounter (Unsigned)
 Copied from CRM [id number]. Topic: Clinical - Medication Refill >> May 09, 2024  9:15 AM Carlatta H wrote: Medication: ALPRAZolam  (XANAX ) 0.5 MG tablet [[id number]]  Has the patient contacted their pharmacy? Yes (Agent: If no, request that the patient contact the pharmacy for the refill. If patient does not wish to contact the pharmacy document the reason why and proceed with request.) (Agent: If yes, when and what did the pharmacy advise?)  This is the patient's preferred pharmacy:  [facility] - [place], [place] - [address] [place] [address] Phone: [phone] Fax: [phone]  Is this the correct pharmacy for this prescription? Yes If no, delete pharmacy and type the correct one.   Has the prescription been filled recently? No  Is the patient out of the medication? Yes  Has the patient been seen for an appointment in the last year OR does the patient have an upcoming appointment? No  Can we respond through MyChart? No  Agent: Please be advised that Rx refills may take up to 3 business days. We ask that you follow-up with your pharmacy.

## 2024-05-10 ENCOUNTER — Ambulatory Visit: Attending: Student | Admitting: Student

## 2024-05-10 DIAGNOSIS — I10 Essential (primary) hypertension: Secondary | ICD-10-CM

## 2024-05-10 DIAGNOSIS — E785 Hyperlipidemia, unspecified: Secondary | ICD-10-CM

## 2024-05-10 DIAGNOSIS — I2510 Atherosclerotic heart disease of native coronary artery without angina pectoris: Secondary | ICD-10-CM

## 2024-05-10 DIAGNOSIS — N1831 Chronic kidney disease, stage 3a: Secondary | ICD-10-CM

## 2024-05-10 MED ORDER — ALPRAZOLAM 0.5 MG PO TABS
0.5000 mg | ORAL_TABLET | Freq: Three times a day (TID) | ORAL | 0 refills | Status: DC | PRN
Start: 2024-05-10 — End: 2024-06-14

## 2024-05-10 NOTE — Telephone Encounter (Signed)
 Requested medications are due for refill today.  yes  Requested medications are on the active medications list.  yes  Last refill. 04/12/2024 #30 0 rf  Future visit scheduled.   no  Notes to clinic.  Refill not delegated.    Requested Prescriptions  Pending Prescriptions Disp Refills   ALPRAZolam  (XANAX ) 0.5 MG tablet 30 tablet 0    Sig: Take 1 tablet (0.5 mg total) by mouth 3 (three) times daily as needed for anxiety.     Not Delegated - Psychiatry: Anxiolytics/Hypnotics 2 Failed - 05/10/2024  1:53 PM      Failed - This refill cannot be delegated      Failed - Urine Drug Screen completed in last 360 days      Passed - Patient is not pregnant      Passed - Valid encounter within last 6 months    Recent Outpatient Visits           3 weeks ago Syncope, unspecified syncope type   [place] [facility] Pickard, Butler DASEN, MD   3 months ago Essential hypertension   [place] [facility] Duanne, Butler DASEN, MD   7 months ago Essential hypertension   [facility] Duanne Butler DASEN, MD   1 year ago ASCVD (arteriosclerotic cardiovascular disease)   [place] [facility] Pickard, Butler DASEN, MD       Future Appointments             In 3 months Matilda Senior, MD [facility] [place]

## 2024-06-11 ENCOUNTER — Other Ambulatory Visit: Payer: Self-pay | Admitting: Family Medicine

## 2024-06-11 DIAGNOSIS — F411 Generalized anxiety disorder: Secondary | ICD-10-CM

## 2024-06-13 NOTE — Telephone Encounter (Signed)
 Requested medication (s) are due for refill today: yes  Requested medication (s) are on the active medication list: yes  Last refill:  05/10/24  Future visit scheduled: no  Notes to clinic:  Unable to refill per protocol, cannot delegate.      Requested Prescriptions  Pending Prescriptions Disp Refills   ALPRAZolam  (XANAX ) 0.5 MG tablet [Pharmacy Med Name: alprazolam  0.5 mg tablet] 30 tablet 0    Sig: Take 1 tablet (0.5 mg total) by mouth 3 (three) times daily as needed for anxiety.     Not Delegated - Psychiatry: Anxiolytics/Hypnotics 2 Failed - 06/13/2024  9:32 AM      Failed - This refill cannot be delegated      Failed - Urine Drug Screen completed in last 360 days      Passed - Patient is not pregnant      Passed - Valid encounter within last 6 months    Recent Outpatient Visits           1 month ago Syncope, unspecified syncope type   [place] [facility] Pickard, Butler DASEN, MD   4 months ago Essential hypertension   [facility] Duanne, Butler DASEN, MD   8 months ago Essential hypertension   [place] [facility] [place]anne Butler DASEN, MD   1 year ago ASCVD (arteriosclerotic cardiovascular disease)   [place] [facility] Pickard, Butler DASEN, MD       Future Appointments             In 2 months Matilda Senior, MD [facility] [place]

## 2024-06-14 ENCOUNTER — Telehealth: Payer: Self-pay

## 2024-06-14 ENCOUNTER — Other Ambulatory Visit: Payer: Self-pay | Admitting: Family Medicine

## 2024-06-14 ENCOUNTER — Telehealth: Payer: Self-pay | Admitting: Family Medicine

## 2024-06-14 DIAGNOSIS — F411 Generalized anxiety disorder: Secondary | ICD-10-CM

## 2024-06-14 MED ORDER — HYDROCODONE-ACETAMINOPHEN 5-325 MG PO TABS: 1.0000 | ORAL_TABLET | Freq: Four times a day (QID) | ORAL | 0 refills | Status: DC | PRN

## 2024-06-14 MED ORDER — ALPRAZOLAM 0.5 MG PO TABS
0.5000 mg | ORAL_TABLET | Freq: Three times a day (TID) | ORAL | 0 refills | Status: DC | PRN
Start: 2024-06-14 — End: 2024-07-13

## 2024-06-14 NOTE — Telephone Encounter (Unsigned)
 Copied from CRM #[id number]. Topic: Clinical - Medication Refill >> Jun 14, 2024 10:31 AM Willma R wrote: Medication: HYDROcodone -acetaminophen  (NORCO/VICODIN) 5-325 MG tablet  Has the patient contacted their pharmacy? Yes, Call dr  This is the patient's preferred pharmacy:  [facility] - [place], [place] - [address] [place] [address] Phone: [phone] Fax: [phone]  Is this the correct pharmacy for this prescription? Yes If no, delete pharmacy and type the correct one.   Has the prescription been filled recently? No  Is the patient out of the medication? No  Has the patient been seen for an appointment in the last year OR does the patient have an upcoming appointment? Yes  Can we respond through MyChart? No  Agent: Please be advised that Rx refills may take up to 3 business days. We ask that you follow-up with your pharmacy.

## 2024-06-14 NOTE — Telephone Encounter (Signed)
 Copied from CRM #[id number]. Topic: Clinical - Medication Question >> Jun 14, 2024 10:30 AM Willma SAUNDERS wrote: Reason for CRM: Patient calling to check the status of his refill request for ALPRAZolam  (XANAX ) 0.5 MG tablet. Was requested by pharmacy on 08/18 but patient hasn't gotten an update.  Patient can be reached at [phone]

## 2024-07-12 ENCOUNTER — Other Ambulatory Visit: Payer: Self-pay | Admitting: Family Medicine

## 2024-07-12 DIAGNOSIS — F411 Generalized anxiety disorder: Secondary | ICD-10-CM

## 2024-07-13 NOTE — Telephone Encounter (Signed)
 Requested medication (s) are due for refill today: yes  Requested medication (s) are on the active medication list: yes  Last refill:  06/14/24  Future visit scheduled: no  Notes to clinic:  Unable to refill per protocol, cannot delegate.      Requested Prescriptions  Pending Prescriptions Disp Refills   ALPRAZolam  (XANAX ) 0.5 MG tablet [Pharmacy Med Name: alprazolam  0.5 mg tablet] 30 tablet 0    Sig: Take 1 tablet (0.5 mg total) by mouth 3 (three) times daily as needed for anxiety.     Not Delegated - Psychiatry: Anxiolytics/Hypnotics 2 Failed - 07/13/2024  8:34 AM      Failed - This refill cannot be delegated      Failed - Urine Drug Screen completed in last 360 days      Passed - Patient is not pregnant      Passed - Valid encounter within last 6 months    Recent Outpatient Visits           2 months ago Syncope, unspecified syncope type   [place] [facility] Pickard, Butler DASEN, MD   5 months ago Essential hypertension   [place] [facility] Duanne, Butler DASEN, MD   9 months ago Essential hypertension   [place] [facility] Duanne, Butler DASEN, MD   1 year ago ASCVD (arteriosclerotic cardiovascular disease)   [place] [facility] Medicine Duanne Butler DASEN, MD       Future Appointments             In 1 month Matilda Senior, MD [facility] [place]             HYDROcodone -acetaminophen  (NORCO/VICODIN) 5-325 MG tablet [Pharmacy Med Name: hydrocodone  5 mg-acetaminophen  325 mg tablet] 90 tablet 0    Sig: Take 1 tablet by mouth every 6 (six) hours as needed for moderate pain (pain score 4-6).     Not Delegated - Analgesics:  Opioid Agonist Combinations Failed - 07/13/2024  8:34 AM      Failed - This refill cannot be delegated      Failed - Urine Drug Screen completed in last 360 days      Passed - Valid encounter within last 3 months    Recent Outpatient Visits           2  months ago Syncope, unspecified syncope type   [place] [facility] Duanne, Butler DASEN, MD   5 months ago Essential hypertension   [place] [facility] Duanne, Butler DASEN, MD   9 months ago Essential hypertension   [place] [facility] Medicine Duanne, Butler DASEN, MD   1 year ago ASCVD (arteriosclerotic cardiovascular disease)   [place] [facility] Pickard, Butler DASEN, MD       Future Appointments             In 1 month Matilda Senior, MD [facility] [place]

## 2024-08-09 ENCOUNTER — Other Ambulatory Visit: Payer: Self-pay

## 2024-08-09 ENCOUNTER — Telehealth: Payer: Self-pay

## 2024-08-09 DIAGNOSIS — F411 Generalized anxiety disorder: Secondary | ICD-10-CM

## 2024-08-09 MED ORDER — HYDROCODONE-ACETAMINOPHEN 5-325 MG PO TABS: 1.0000 | ORAL_TABLET | Freq: Four times a day (QID) | ORAL | 0 refills | Status: DC | PRN

## 2024-08-09 MED ORDER — ALPRAZOLAM 0.5 MG PO TABS: 0.5000 mg | ORAL_TABLET | Freq: Three times a day (TID) | ORAL | 0 refills | Status: DC | PRN

## 2024-08-09 NOTE — Telephone Encounter (Signed)
 Prescription Request  08/09/2024  LOV: 04/19/24  What is the name of the medication or equipment? HYDROcodone -acetaminophen  (NORCO/VICODIN) 5-325 MG tablet [[id number]]   Have you contacted your pharmacy to request a refill? Yes   Which pharmacy would you like this sent to?  [facility] - [place], [place] - [address] [place] [address] Phone: [phone] Fax: [phone]    Patient notified that their request is being sent to the clinical staff for review and that they should receive a response within 2 business days.   Please advise at [facility] [phone]  Prescription Request  08/09/2024  LOV: 04/19/24  What is the name of the medication or equipment? ALPRAZolam  (XANAX ) 0.5 MG tablet [[id number]]   Have you contacted your pharmacy to request a refill? Yes   Which pharmacy would you like this sent to?  [facility] - [place], [place] - [address] [place] [address] Phone: [phone] Fax: [phone]    Patient notified that their request is being sent to the clinical staff for review and that they should receive a response within 2 business days.   Please advise at [facility] [phone]

## 2024-08-09 NOTE — Telephone Encounter (Signed)
 Sent in medication

## 2024-08-20 NOTE — Progress Notes (Unsigned)
 Impression/Assessment:  Grade group 2  prostate cancer, over a year and a half out from I-125 brachytherapy, doing quite well.  Plan:   History of Present Illness:    7.27.2021: TRUS/Bx.  Prostate volume 34.7 mL, PSA 5.3, PSAD 0.15.  3/12 cores revealed adenocarcinoma as follows:  Left apex lateral GS 3+3 and 95% of core Right apex medial GS 3+4 and 40% of core Right mid lateral, GS 3+3 in 10% of core   1.10.2022: I-125 brachytherapy, placement of SpaceOAR 9.20.2022: PSA 0.7 3.21.2023: PSA 1.0. 9.26.2023: PSA 1.1.   3.26.2024: PSA 0.2  10.21.2024: This man is here today for his routine check.  He is doing well from a urologic standpoint, without significant complaints.  IPSS 12/2.  He has not noted blood in his stool or in his urine.  No real issues with erections.  PSA  0.2.  10.28.2025:  Past Medical History:  Diagnosis Date   Anxiety    Arthritis    Asthma    Cancer (HCC)    right renal cancer, right radical nephrectomy 2004   Chronic back pain    Chronic hip pain, left    CKD (chronic kidney disease) stage 3, GFR 30-59 ml/min (HCC)    ckd stage 3 b    Complication of anesthesia    1 seizure after anesthesia 1999 or 2000 none since , recent surgeries no seizures   Coronary artery disease    a. s/p prior PCI to RCA and LCx in 2009   Depression    GERD (gastroesophageal reflux disease)    Gout    no recent flares   Heart disease    High blood pressure    History of PTCA 2 2004   2 stents   History of stomach ulcers    Hypercholesteremia    Hypertension    Kidney disease    Myocardial infarction (HCC) 2004   Prostate cancer (HCC)    3/12 cores upt to 40% grade 2 cancer. Planning brachytherapy (2021)   Proteinuria    Renal insufficiency    Tennis elbow     Past Surgical History:  Procedure Laterality Date   COLONOSCOPY WITH PROPOFOL  N/A 10/28/2020   Procedure: COLONOSCOPY WITH PROPOFOL ;  Surgeon: Eartha Angelia Sieving, MD;  [facility];  Service: Gastroenterology;  Laterality: N/A;  2:30   coloscopy  10/28/2020   removed 7 polyps done at [place]   CYSTOSCOPY N/A 11/03/2020   Procedure: CYSTOSCOPY;  Surgeon: Matilda Senior, MD;  [facility];  Service: Urology;  Laterality: N/A;   Heart catherization  2004   2 stents to heart cypher left circulflex   HERNIA REPAIR  1999   left groin bilateral   HIP SURGERY Left 1999 or 2000   bond graft   Kidney removed Right 2003 or 2004   for cancer   LEFT HEART CATH AND CORONARY ANGIOGRAPHY N/A 09/29/2022   Procedure: LEFT HEART CATH AND CORONARY ANGIOGRAPHY;  Surgeon: Dann Candyce RAMAN, MD;  Location: [facility] INVAS[facility] CV [facility];  Service: Cardiovascular;  Laterality: N/A;   POLYPECTOMY  10/28/2020   Procedure: POLYPECTOMY INTESTINAL;  Surgeon: Eartha Angelia Sieving, MD;  [facility];  Service: Gastroenterology;;   PROSTATE BIOPSY  2021   RADIOACTIVE SEED IMPLANT N/A 11/03/2020   Procedure: RADIOACTIVE SEED IMPLANT/BRACHYTHERAPY IMPLANT;  Surgeon: Matilda Senior, MD;  Location: [facility];  Service: Urology;  Laterality: N/A;  90 MINS   SPACE OAR  INSTILLATION N/A 11/03/2020   Procedure: SPACE OAR INSTILLATION;  Surgeon: Matilda Senior, MD;  Location[facility];  Service: Urology;  Laterality: N/A;    Home Medications:  Allergies as of 08/21/2024       Reactions   Other    Pt. Reports having seizures after anesthesia once 1999 or 2000 none since   Keflex  [cephalexin ] Rash        Medication List        Accurate as of August 20, 2024  1:49 PM. If you have any questions, ask your nurse or doctor.          allopurinol  100 MG tablet Commonly known as: ZYLOPRIM  Take 2 tablets (200 mg total) by mouth daily.   ALPRAZolam  0.5 MG tablet Commonly known as: XANAX  Take 1 tablet (0.5 mg total) by mouth 3 (three) times daily as needed for anxiety.   aspirin  81 MG tablet Take 81 mg by mouth  daily.   atorvastatin  80 MG tablet Commonly known as: LIPITOR Take 1 tablet (80 mg total) by mouth daily.   carvedilol  12.5 MG tablet Commonly known as: COREG  Take 1 tablet (12.5 mg total) by mouth 2 (two) times daily. (AM & PM)   ezetimibe  10 MG tablet Commonly known as: ZETIA  Take 1 tablet (10 mg total) by mouth daily.   furosemide  20 MG tablet Commonly known as: LASIX  Take 20 mg by mouth daily as needed for edema or fluid.   gabapentin  300 MG capsule Commonly known as: NEURONTIN  Take 1 capsule (300 mg total) by mouth at bedtime.   hydrALAZINE  50 MG tablet Commonly known as: APRESOLINE  Take 1 tablet (50 mg total) by mouth 3 (three) times daily.   HYDROcodone -acetaminophen  5-325 MG tablet Commonly known as: NORCO/VICODIN Take 1 tablet by mouth every 6 (six) hours as needed for moderate pain (pain score 4-6).   lisinopril  20 MG tablet Commonly known as: ZESTRIL  Take 20 mg by mouth daily. (Dinner time)   multivitamin with minerals Tabs tablet Take 1 tablet by mouth daily.   NIFEdipine  90 MG 24 hr tablet Commonly known as: PROCARDIA  XL/NIFEDICAL-XL Take 1 tablet (90 mg total) by mouth at bedtime.   pantoprazole  40 MG tablet Commonly known as: PROTONIX  Take 1 tablet (40 mg total) by mouth 2 (two) times daily.        Allergies:  Allergies  Allergen Reactions   Other     Pt. Reports having seizures after anesthesia once 1999 or 2000 none since   Keflex  [Cephalexin ] Rash    Family History  Problem Relation Age of Onset   Heart disease Other    Cancer Other    Kidney disease Other    Heart disease Father    Prostate cancer Father    Heart disease Mother    Lung cancer Mother    Kidney cancer Mother    Prostate cancer Maternal Uncle    Bladder Cancer Maternal Grandfather    Breast cancer Neg Hx    Colon cancer Neg Hx    Pancreatic cancer Neg Hx     Social History:  reports that he quit smoking about 21 years ago. His smoking use included cigarettes.  He started smoking about 53 years ago. He has a 48 pack-year smoking history. He has never used smokeless tobacco. He reports current alcohol use. He reports that he does not use drugs.  ROS: A complete review of systems was performed.  All systems are negative except for pertinent findings as noted.  Physical Exam:  Vital signs in last 24 hours: There were no vitals taken for this visit. Constitutional:  Alert and oriented, No acute distress Cardiovascular: Regular rate  Respiratory: Normal respiratory effort Neurologic: Grossly intact, no focal deficits Psychiatric: Normal mood and affect  I have reviewed prior pt notes  I have reviewed urinalysis results--clear  I have independently reviewed prior imaging--prostate U/S  I have reviewed prior PSA and pathology results  IPSS sheet reviewed

## 2024-08-21 ENCOUNTER — Ambulatory Visit: Payer: PPO | Admitting: Urology

## 2024-08-21 DIAGNOSIS — Z8546 Personal history of malignant neoplasm of prostate: Secondary | ICD-10-CM

## 2024-08-21 DIAGNOSIS — N5201 Erectile dysfunction due to arterial insufficiency: Secondary | ICD-10-CM

## 2024-08-21 DIAGNOSIS — N401 Enlarged prostate with lower urinary tract symptoms: Secondary | ICD-10-CM

## 2024-09-10 ENCOUNTER — Other Ambulatory Visit: Payer: Self-pay

## 2024-09-10 NOTE — Telephone Encounter (Signed)
 Prescription Request  09/10/2024  LOV: 04/19/24  What is the name of the medication or equipment? HYDROcodone -acetaminophen  (NORCO/VICODIN) 5-325 MG tablet [[id number]]   Have you contacted your pharmacy to request a refill? Yes   Which pharmacy would you like this sent to?  [facility] - [place], [place] - [address] [place] [address] Phone: [phone] Fax: [phone]    Patient notified that their request is being sent to the clinical staff for review and that they should receive a response within 2 business days.   Please advise at [facility] [phone]  Prescription Request  09/10/2024  LOV: 04/19/24  What is the name of the medication or equipment? ALPRAZolam  (XANAX ) 0.5 MG tablet [[id number]]   Have you contacted your pharmacy to request a refill? Yes   Which pharmacy would you like this sent to?  [facility] - [place], [place] - [address] [place] [address] Phone: [phone] Fax: [phone]    Patient notified that their request is being sent to the clinical staff for review and that they should receive a response within 2 business days.   Please advise at [facility] [phone]

## 2024-09-13 ENCOUNTER — Other Ambulatory Visit: Payer: Self-pay | Admitting: Family Medicine

## 2024-09-13 ENCOUNTER — Telehealth: Payer: Self-pay

## 2024-09-13 DIAGNOSIS — F411 Generalized anxiety disorder: Secondary | ICD-10-CM

## 2024-09-13 MED ORDER — ALPRAZOLAM 0.5 MG PO TABS: 0.5000 mg | ORAL_TABLET | Freq: Three times a day (TID) | ORAL | 0 refills | Status: DC | PRN

## 2024-09-13 MED ORDER — HYDROCODONE-ACETAMINOPHEN 5-325 MG PO TABS: 1.0000 | ORAL_TABLET | Freq: Four times a day (QID) | ORAL | 0 refills | Status: DC | PRN

## 2024-09-13 NOTE — Telephone Encounter (Signed)
 Requested medication (s) are due for refill today- yes  Requested medication (s) are on the active medication list -yes  Future visit scheduled -no  Last refill: 08/09/24 #90  Notes to clinic: non delegated Rx  Requested Prescriptions  Pending Prescriptions Disp Refills   HYDROcodone -acetaminophen  (NORCO/VICODIN) 5-325 MG tablet 90 tablet 0    Sig: Take 1 tablet by mouth every 6 (six) hours as needed for moderate pain (pain score 4-6).     Not Delegated - Analgesics:  Opioid Agonist Combinations Failed - 09/13/2024 11:35 AM      Failed - This refill cannot be delegated      Failed - Urine Drug Screen completed in last 360 days      Failed - Valid encounter within last 3 months    Recent Outpatient Visits           4 months ago Syncope, unspecified syncope type   [place] [facility] Pickard, Butler DASEN, MD   8 months ago Essential hypertension   [facility] [facility] Duanne, Butler DASEN, MD   11 months ago Essential hypertension   [place] [facility] Duanne Butler DASEN, MD   1 year ago ASCVD (arteriosclerotic cardiovascular disease)   [place] [facility] Medicine Duanne Butler DASEN, MD                 Requested Prescriptions  Pending Prescriptions Disp Refills   HYDROcodone -acetaminophen  (NORCO/VICODIN) 5-325 MG tablet 90 tablet 0    Sig: Take 1 tablet by mouth every 6 (six) hours as needed for moderate pain (pain score 4-6).     Not Delegated - Analgesics:  Opioid Agonist Combinations Failed - 09/13/2024 11:35 AM      Failed - This refill cannot be delegated      Failed - Urine Drug Screen completed in last 360 days      Failed - Valid encounter within last 3 months    Recent Outpatient Visits           4 months ago Syncope, unspecified syncope type   [place] Duanne, Butler DASEN, MD   8 months ago Essential hypertension   [facility] Duanne, Butler DASEN, MD   11 months ago Essential hypertension   [place] [facility] Medicine Pickard, Butler DASEN, MD   1 year ago ASCVD (arteriosclerotic cardiovascular disease)    [facility] Pickard, Butler DASEN, MD

## 2024-09-13 NOTE — Telephone Encounter (Signed)
 Copied from CRM [id number]. Topic: Clinical - Medication Question >> Sep 13, 2024 12:23 PM Emylou G wrote: Reason for CRM: Pls call patient - checking status of refill

## 2024-10-16 ENCOUNTER — Other Ambulatory Visit: Payer: Self-pay | Admitting: Family Medicine

## 2024-10-16 DIAGNOSIS — F411 Generalized anxiety disorder: Secondary | ICD-10-CM

## 2024-10-16 NOTE — Telephone Encounter (Signed)
 Copied from CRM [id number]. Topic: Clinical - Medication Refill >> Oct 16, 2024 10:13 AM Sophia H wrote: Medication: HYDROcodone -acetaminophen  (NORCO/VICODIN) 5-325 MG tablet ALPRAZolam  (XANAX ) 0.5 MG tablet  Has the patient contacted their pharmacy? Yes, told pharmacy has reached out with no response.   This is the patient's preferred pharmacy:  [facility] - [place], [place] - [address] [place] [address] Phone: [phone] Fax: [phone]  Is this the correct pharmacy for this prescription? Yes If no, delete pharmacy and type the correct one.   Has the prescription been filled recently? Yes  Is the patient out of the medication? Yes  Has the patient been seen for an appointment in the last year OR does the patient have an upcoming appointment? Yes, appt back in June   Can we respond through MyChart? Yes  Agent: Please be advised that Rx refills may take up to 3 business days. We ask that you follow-up with your pharmacy.

## 2024-10-17 NOTE — Telephone Encounter (Signed)
 Requested medication (s) are due for refill today: Yes  Requested medication (s) are on the active medication list: Yes  Last refill:  09/13/24  Future visit scheduled: No  Notes to clinic:  Unable to refill per protocol, cannot delegate.      Requested Prescriptions  Pending Prescriptions Disp Refills   HYDROcodone -acetaminophen  (NORCO/VICODIN) 5-325 MG tablet 90 tablet 0    Sig: Take 1 tablet by mouth every 6 (six) hours as needed for moderate pain (pain score 4-6).     Not Delegated - Analgesics:  Opioid Agonist Combinations Failed - 10/17/2024 12:07 PM      Failed - This refill cannot be delegated      Failed - Urine Drug Screen completed in last 360 days      Failed - Valid encounter within last 3 months    Recent Outpatient Visits           6 months ago Syncope, unspecified syncope type   [place] [facility] Pickard, Butler DASEN, MD   9 months ago Essential hypertension   [place] [facility] Pickard, Butler DASEN, MD   1 year ago Essential hypertension   [place] Duanne Butler DASEN, MD   1 year ago ASCVD (arteriosclerotic cardiovascular disease)   [place] [facility] Medicine Duanne Butler DASEN, MD               ALPRAZolam  (XANAX ) 0.5 MG tablet 30 tablet 0    Sig: Take 1 tablet (0.5 mg total) by mouth 3 (three) times daily as needed for anxiety.     Not Delegated - Psychiatry: Anxiolytics/Hypnotics 2 Failed - 10/17/2024 12:07 PM      Failed - This refill cannot be delegated      Failed - Urine Drug Screen completed in last 360 days      Failed - Valid encounter within last 6 months    Recent Outpatient Visits           6 months ago Syncope, unspecified syncope type   [place] [facility] Medicine Duanne, Butler DASEN, MD   9 months ago Essential hypertension   [place] [facility] Pickard, Butler DASEN, MD   1 year ago Essential hypertension   [facility] Duanne Butler DASEN, MD   1 year ago ASCVD (arteriosclerotic cardiovascular disease)   [place] [facility] Medicine Pickard, Butler DASEN, MD              Passed - Patient is not pregnant

## 2024-10-19 ENCOUNTER — Telehealth: Payer: Self-pay

## 2024-10-19 MED ORDER — HYDROCODONE-ACETAMINOPHEN 5-325 MG PO TABS: 1.0000 | ORAL_TABLET | Freq: Four times a day (QID) | ORAL | 0 refills | Status: DC | PRN

## 2024-10-19 MED ORDER — ALPRAZOLAM 0.5 MG PO TABS: 0.5000 mg | ORAL_TABLET | Freq: Three times a day (TID) | ORAL | 0 refills | Status: DC | PRN

## 2024-10-19 NOTE — Telephone Encounter (Signed)
 Copied from CRM [id number]. Topic: Clinical - Medication Refill >> Oct 16, 2024 10:13 AM Sophia H wrote: Medication: HYDROcodone -acetaminophen  (NORCO/VICODIN) 5-325 MG tablet ALPRAZolam  (XANAX ) 0.5 MG tablet  Has the patient contacted their pharmacy? Yes, told pharmacy has reached out with no response.   This is the patient's preferred pharmacy:  [facility] - [place], [place] - [address] [place] [address] Phone: [phone] Fax: [phone]  Is this the correct pharmacy for this prescription? Yes If no, delete pharmacy and type the correct one.   Has the prescription been filled recently? Yes  Is the patient out of the medication? Yes  Has the patient been seen for an appointment in the last year OR does the patient have an upcoming appointment? Yes, appt back in June   Can we respond through MyChart? Yes  Agent: Please be advised that Rx refills may take up to 3 business days. We ask that you follow-up with your pharmacy. >> Oct 19, 2024  9:56 AM Donna BRAVO wrote: Patient calling asking for update on medication refill, and why it has not been sent to pharmacy.  Patient is out of medication   Patient would like to call back from nurse.  Phone [phone]

## 2024-10-19 NOTE — Telephone Encounter (Signed)
 Medication sent in by Dr Duanne today.

## 2024-11-13 ENCOUNTER — Other Ambulatory Visit: Payer: Self-pay | Admitting: Family Medicine

## 2024-11-13 ENCOUNTER — Telehealth: Payer: Self-pay | Admitting: Family Medicine

## 2024-11-13 DIAGNOSIS — F411 Generalized anxiety disorder: Secondary | ICD-10-CM

## 2024-11-13 MED ORDER — ALPRAZOLAM 0.5 MG PO TABS: 0.5000 mg | ORAL_TABLET | Freq: Three times a day (TID) | ORAL | 0 refills | Status: AC | PRN

## 2024-11-13 NOTE — Telephone Encounter (Signed)
 Prescription Request  11/13/2024  LOV: 04/19/2024  What is the name of the medication or equipment? ALPRAZolam  (XANAX ) 0.5 MG tablet [[id number]]    Have you contacted your pharmacy to request a refill? Yes   Which pharmacy would you like this sent to?  [facility] - [place], [place] - [address] [place] [address] Phone: [phone] Fax: [phone]    Patient notified that their request is being sent to the clinical staff for review and that they should receive a response within 2 business days.   Please advise at [facility] [phone]

## 2024-11-15 ENCOUNTER — Other Ambulatory Visit: Payer: Self-pay | Admitting: Family Medicine

## 2024-11-15 NOTE — Telephone Encounter (Signed)
 Requested medications are due for refill today.  yes  Requested medications are on the active medications list.  yes  Last refill. 10/19/2024 #90 0 rf  Future visit scheduled.   no  Notes to clinic.  Refill not delegated.     Requested Prescriptions  Pending Prescriptions Disp Refills   HYDROcodone -acetaminophen  (NORCO/VICODIN) 5-325 MG tablet 90 tablet 0    Sig: Take 1 tablet by mouth every 6 (six) hours as needed for moderate pain (pain score 4-6).     Not Delegated - Analgesics:  Opioid Agonist Combinations Failed - 11/15/2024  4:14 PM      Failed - This refill cannot be delegated      Failed - Urine Drug Screen completed in last 360 days      Failed - Valid encounter within last 3 months    Recent Outpatient Visits           7 months ago Syncope, unspecified syncope type   [place] [facility] Duanne, Butler DASEN, MD   10 months ago Essential hypertension   [place] [facility] Pickard, Butler DASEN, MD   1 year ago Essential hypertension   [place] [facility] Duanne Butler DASEN, MD   1 year ago ASCVD (arteriosclerotic cardiovascular disease)    [facility] Pickard, Butler DASEN, MD

## 2024-11-15 NOTE — Telephone Encounter (Signed)
 Copied from CRM #[id number]. Topic: Clinical - Medication Refill >> Nov 15, 2024 10:43 AM Mia F wrote: Medication: HYDROcodone -acetaminophen  (NORCO/VICODIN) 5-325 MG tablet  Has the patient contacted their pharmacy? Yes (Agent: If no, request that the patient contact the pharmacy for the refill. If patient does not wish to contact the pharmacy document the reason why and proceed with request.) (Agent: If yes, when and what did the pharmacy advise?)  This is the patient's preferred pharmacy:  [facility] - [place], [place] - [address] [place] [address] Phone: [phone] Fax: [phone]  Is this the correct pharmacy for this prescription? Yes If no, delete pharmacy and type the correct one.   Has the prescription been filled recently? Yes  Is the patient out of the medication? Yes  Has the patient been seen for an appointment in the last year OR does the patient have an upcoming appointment? Yes  Can we respond through MyChart? No  Agent: Please be advised that Rx refills may take up to 3 business days. We ask that you follow-up with your pharmacy.

## 2024-11-16 MED ORDER — HYDROCODONE-ACETAMINOPHEN 5-325 MG PO TABS: 1.0000 | ORAL_TABLET | Freq: Four times a day (QID) | ORAL | 0 refills | Status: AC | PRN

## 2024-11-16 NOTE — Telephone Encounter (Signed)
 The patient called back in checking on the status of his refill request stating he has been without his pain meds for a few days and is worried with this storm coming in. Please assist patient further

## 2024-12-05 ENCOUNTER — Ambulatory Visit
